# Patient Record
Sex: Female | Born: 1935
Health system: Southern US, Community
[De-identification: ages and names within clinical notes are randomized; demographics above are authoritative.]

## PROBLEM LIST (undated history)

## (undated) DIAGNOSIS — R296 Repeated falls: Secondary | ICD-10-CM

## (undated) DIAGNOSIS — E785 Hyperlipidemia, unspecified: Secondary | ICD-10-CM

## (undated) DIAGNOSIS — I10 Essential (primary) hypertension: Secondary | ICD-10-CM

## (undated) DIAGNOSIS — J45909 Unspecified asthma, uncomplicated: Secondary | ICD-10-CM

## (undated) DIAGNOSIS — I2510 Atherosclerotic heart disease of native coronary artery without angina pectoris: Secondary | ICD-10-CM

## (undated) HISTORY — DX: Essential (primary) hypertension: I10

## (undated) HISTORY — DX: Unspecified asthma, uncomplicated: J45.909

## (undated) HISTORY — DX: Hyperlipidemia, unspecified: E78.5

## (undated) HISTORY — PX: CATARACT EXTRACTION: SUR2

## (undated) HISTORY — DX: Atherosclerotic heart disease of native coronary artery without angina pectoris: I25.10

## (undated) HISTORY — PX: CORONARY ANGIOPLASTY: SHX604

---

## 2001-05-24 ENCOUNTER — Emergency Department (HOSPITAL_COMMUNITY): Admission: EM | Admit: 2001-05-24 | Discharge: 2001-05-24 | Payer: Self-pay | Admitting: Emergency Medicine

## 2001-05-29 ENCOUNTER — Emergency Department (HOSPITAL_COMMUNITY): Admission: EM | Admit: 2001-05-29 | Discharge: 2001-05-29 | Payer: Self-pay | Admitting: *Deleted

## 2001-10-20 ENCOUNTER — Ambulatory Visit (HOSPITAL_COMMUNITY): Admission: RE | Admit: 2001-10-20 | Discharge: 2001-10-20 | Payer: Self-pay | Admitting: Internal Medicine

## 2002-03-26 ENCOUNTER — Emergency Department (HOSPITAL_COMMUNITY): Admission: EM | Admit: 2002-03-26 | Discharge: 2002-03-26 | Payer: Self-pay | Admitting: Emergency Medicine

## 2002-03-29 ENCOUNTER — Encounter: Payer: Self-pay | Admitting: Internal Medicine

## 2002-03-29 ENCOUNTER — Inpatient Hospital Stay (HOSPITAL_COMMUNITY): Admission: AD | Admit: 2002-03-29 | Discharge: 2002-04-04 | Payer: Self-pay | Admitting: Internal Medicine

## 2002-03-31 ENCOUNTER — Encounter: Payer: Self-pay | Admitting: Internal Medicine

## 2002-04-25 ENCOUNTER — Encounter: Admission: RE | Admit: 2002-04-25 | Discharge: 2002-04-25 | Payer: Self-pay | Admitting: Internal Medicine

## 2002-04-25 ENCOUNTER — Encounter: Payer: Self-pay | Admitting: Internal Medicine

## 2013-05-16 ENCOUNTER — Encounter: Payer: Self-pay | Admitting: *Deleted

## 2013-05-17 ENCOUNTER — Encounter: Payer: Self-pay | Admitting: Cardiology

## 2013-05-17 ENCOUNTER — Ambulatory Visit: Payer: Self-pay | Admitting: Cardiology

## 2013-05-17 ENCOUNTER — Ambulatory Visit (INDEPENDENT_AMBULATORY_CARE_PROVIDER_SITE_OTHER): Payer: Medicare HMO | Admitting: Cardiology

## 2013-05-17 VITALS — BP 160/80 | HR 57 | Ht 66.5 in | Wt 161.1 lb

## 2013-05-17 DIAGNOSIS — I10 Essential (primary) hypertension: Secondary | ICD-10-CM

## 2013-05-17 DIAGNOSIS — R0989 Other specified symptoms and signs involving the circulatory and respiratory systems: Secondary | ICD-10-CM

## 2013-05-17 DIAGNOSIS — I25810 Atherosclerosis of coronary artery bypass graft(s) without angina pectoris: Secondary | ICD-10-CM

## 2013-05-17 DIAGNOSIS — R079 Chest pain, unspecified: Secondary | ICD-10-CM

## 2013-05-17 NOTE — Patient Instructions (Signed)
Your physician wants you to follow-up in: [place] will receive a reminder letter in the mail two months in advance. If you don't receive a letter, please call our office to schedule the follow-up appointment.    Your physician has requested that you have an exercise tolerance test. For further information please visit [email or web address]. Please also follow instruction sheet, as given.   Your physician has requested that you have a carotid duplex. This test is an ultrasound of the carotid arteries in your neck. It looks at blood flow through these arteries that supply the brain with blood. Allow one hour for this exam. There are no restrictions or special instructions.

## 2013-05-17 NOTE — Progress Notes (Signed)
HPI The patient presents as a new patient. She has a history of coronary disease and reports getting 2 stents in 2000 and another 2 stents in 2005. These were apparently at [facility]  However, I don't have any details. She says since 2005 she's not had any further problems. She says she gets along very well and exercises routinely at the [facility]. With this she has not had any of the symptoms that she had in the past.  The patient denies any new symptoms such as chest discomfort, neck or arm discomfort. There has been no new shortness of breath, PND or orthopnea. There have been no reported palpitations, presyncope or syncope.  Her symptoms years ago for somewhat atypical with headache and perhaps chest pain but she doesn't recall all the details. She has had difficult to control hypertension and she worries about this. She recently had her beta blocker dose increased. She was referred here to reestablish with the cardiologist.  No Known Allergies  Current Outpatient Prescriptions  Medication Sig Dispense Refill  . aspirin EC 81 MG tablet Take 81 mg by mouth daily.      Marland Kitchen CARTIA XT 120 MG 24 hr capsule Take 120 mg by mouth daily.       . COMBIVENT RESPIMAT 20-100 MCG/ACT AERS respimat Take 2 puffs by mouth every 6 (six) hours as needed.       . cyclobenzaprine (FLEXERIL) 10 MG tablet Take 10 mg by mouth 3 (three) times daily as needed.       . fluticasone (FLONASE) 50 MCG/ACT nasal spray 1 spray as needed.       . irbesartan (AVAPRO) 300 MG tablet Take 300 mg by mouth daily.       . isosorbide mononitrate (IMDUR) 30 MG 24 hr tablet Take 30 mg by mouth daily.       Marland Kitchen LORazepam (ATIVAN) 1 MG tablet       . lovastatin (MEVACOR) 40 MG tablet Take 40 mg by mouth every evening.       . metoprolol succinate (TOPROL-XL) 100 MG 24 hr tablet Take 100 mg by mouth daily.       . montelukast (SINGULAIR) 10 MG tablet Take 10 mg by mouth every evening.       . Multiple Vitamin (MULTIVITAMIN)  tablet Take 1 tablet by mouth daily.      Marland Kitchen triamterene-hydrochlorothiazide (DYAZIDE) 37.5-25 MG per capsule Take 1 capsule by mouth daily.      . vitamin C (ASCORBIC ACID) 500 MG tablet Take 500 mg by mouth daily.       No current facility-administered medications for this visit.    Past Medical History  Diagnosis Date  . Hypertension   . Asthma   . CAD (coronary artery disease)   . Hyperlipidemia     Past Surgical History  Procedure Laterality Date  . Coronary angioplasty  10 -15 years ago    stent in [place] ,[place]  . Cataract extraction      Family History  Problem Relation Age of Onset  . Cancer Father     colon    History   Social History  . Marital Status: Married    Spouse Name: N/A    Number of Children: 42  . Years of Education: N/A   Occupational History  . Not on file.   Social History Main Topics  . Smoking status: Former Smoker    Types: Cigarettes    Start date: 05/18/1983  .  Smokeless tobacco: Not on file  . Alcohol Use: No  . Drug Use: No  . Sexual Activity: Not on file   Other Topics Concern  . Not on file   Social History Narrative   Lives in retirement home.      ROS:  Positive for decreased hearing, joint pains.  Otherwise as stated in the HPI and negative for all other systems.  PHYSICAL EXAM BP 160/80  Pulse 57  Ht 5' 6.5" (1.689 m)  Wt 161 lb 1.6 oz (73.074 kg)  BMI 25.62 kg/m2 GENERAL:  Well appearing HEENT:  Pupils equal round and reactive, fundi not visualized, oral mucosa unremarkable NECK:  No jugular venous distention, waveform within normal limits, carotid upstroke brisk and symmetric, left carotid bruits, no thyromegaly LYMPHATICS:  No cervical, inguinal adenopathy LUNGS:  Clear to auscultation bilaterally BACK:  No CVA tenderness CHEST:  Unremarkable HEART:  PMI not displaced or sustained,S1 and S2 within normal limits, no S3, no S4, no clicks, no rubs, no murmurs ABD:  Flat, positive bowel sounds normal in  frequency in pitch, no bruits, no rebound, no guarding, no midline pulsatile mass, no hepatomegaly, no splenomegaly EXT:  2 plus pulses throughout, no edema, no cyanosis no clubbing SKIN:  No rashes no nodules NEURO:  Cranial nerves II through XII grossly intact, motor grossly intact throughout PSYCH:  Cognitively intact, oriented to person place and time  EKG:  Sinus bradycardia, rate 57, RSR prime V1 and V2, incomplete right bundle branch block, nonspecific ST-T wave changes.  05/17/2013  ASSESSMENT AND PLAN  CAD:  I will bring the patient back for a POET (Plain Old Exercise Test). This will allow me to screen for obstructive coronary disease, risk stratify and very importantly verify that she is doing the appropriate exercise.   We can also watch her BP with this.  HTN:  I will defer to Vena Austria, MD.  I tried to relieve her anxiety as this is contributing to difficult to control BP.  I might suggest if her blood pressures still remain elevated on followup we could consider treating her with spironolactone.  BRUIT:  She will have Dopplers

## 2013-05-18 ENCOUNTER — Telehealth (HOSPITAL_COMMUNITY): Payer: Self-pay | Admitting: *Deleted

## 2013-05-18 DIAGNOSIS — I10 Essential (primary) hypertension: Secondary | ICD-10-CM | POA: Insufficient documentation

## 2013-05-18 DIAGNOSIS — I2510 Atherosclerotic heart disease of native coronary artery without angina pectoris: Secondary | ICD-10-CM | POA: Insufficient documentation

## 2013-05-26 ENCOUNTER — Telehealth (HOSPITAL_COMMUNITY): Payer: Self-pay | Admitting: *Deleted

## 2013-06-01 ENCOUNTER — Encounter (HOSPITAL_COMMUNITY): Payer: Medicare HMO

## 2013-06-03 ENCOUNTER — Ambulatory Visit (HOSPITAL_COMMUNITY)
Admission: RE | Admit: 2013-06-03 | Discharge: 2013-06-03 | Disposition: A | Discharge status: Home / Self Care | Payer: Medicare HMO | Source: Ambulatory Visit | Attending: Cardiovascular Disease | Admitting: Cardiovascular Disease

## 2013-06-03 DIAGNOSIS — R0989 Other specified symptoms and signs involving the circulatory and respiratory systems: Secondary | ICD-10-CM | POA: Insufficient documentation

## 2013-06-03 NOTE — Progress Notes (Signed)
Carotid Duplex Completed. °Brianna L Mazza,RVT °

## 2013-06-08 ENCOUNTER — Encounter (HOSPITAL_COMMUNITY): Payer: Medicare HMO

## 2013-06-09 ENCOUNTER — Ambulatory Visit (HOSPITAL_COMMUNITY)
Admission: RE | Admit: 2013-06-09 | Discharge: 2013-06-09 | Disposition: A | Discharge status: Home / Self Care | Payer: Medicare HMO | Source: Ambulatory Visit | Attending: Cardiology | Admitting: Cardiology

## 2013-06-09 DIAGNOSIS — R079 Chest pain, unspecified: Secondary | ICD-10-CM | POA: Insufficient documentation

## 2013-06-14 ENCOUNTER — Telehealth: Payer: Self-pay | Admitting: *Deleted

## 2013-06-14 NOTE — Telephone Encounter (Signed)
Left message on private voicemail of GXT results and to call back with questions or concerns.

## 2013-06-28 ENCOUNTER — Telehealth: Payer: Self-pay | Admitting: Hematology and Oncology

## 2013-06-28 NOTE — Telephone Encounter (Signed)
CALLED PATIENT TO SCHEDULE NP APPT WAS TOLD TO CALL [facility] [name] TO SCHEDULE APPT  [phone].  CALLED [facility] @ [phone] WAS NOT ABLE TO LEAVE MESSAGE.

## 2013-06-28 NOTE — Telephone Encounter (Signed)
LEFT MESSAGE FOR PATIENT [facility] [name] TO RETURN CALL TO SCHEDULE NEW PATIENT APPT.

## 2013-07-01 ENCOUNTER — Telehealth: Payer: Self-pay | Admitting: Hematology and Oncology

## 2013-07-01 NOTE — Telephone Encounter (Signed)
C/D 07/01/13 for appt. 07/12/13

## 2013-07-12 ENCOUNTER — Ambulatory Visit: Payer: Medicare HMO

## 2013-07-12 ENCOUNTER — Ambulatory Visit: Payer: Medicare HMO | Admitting: Hematology and Oncology

## 2013-08-09 ENCOUNTER — Ambulatory Visit: Payer: Medicare HMO | Admitting: Hematology and Oncology

## 2013-08-09 ENCOUNTER — Ambulatory Visit: Payer: Medicare HMO

## 2016-05-08 ENCOUNTER — Inpatient Hospital Stay (HOSPITAL_COMMUNITY)
Admission: EM | Admit: 2016-05-08 | Discharge: 2016-05-11 | DRG: 193 | Disposition: A | Discharge status: Home Health | Payer: Medicare Other | Attending: Family Medicine | Admitting: Family Medicine

## 2016-05-08 ENCOUNTER — Emergency Department (HOSPITAL_COMMUNITY): Payer: Medicare Other

## 2016-05-08 ENCOUNTER — Encounter (HOSPITAL_COMMUNITY): Payer: Self-pay | Admitting: Emergency Medicine

## 2016-05-08 DIAGNOSIS — W19XXXA Unspecified fall, initial encounter: Secondary | ICD-10-CM | POA: Diagnosis present

## 2016-05-08 DIAGNOSIS — R599 Enlarged lymph nodes, unspecified: Secondary | ICD-10-CM | POA: Diagnosis not present

## 2016-05-08 DIAGNOSIS — E785 Hyperlipidemia, unspecified: Secondary | ICD-10-CM | POA: Diagnosis present

## 2016-05-08 DIAGNOSIS — E876 Hypokalemia: Secondary | ICD-10-CM | POA: Diagnosis present

## 2016-05-08 DIAGNOSIS — R630 Anorexia: Secondary | ICD-10-CM | POA: Diagnosis present

## 2016-05-08 DIAGNOSIS — Z800 Family history of malignant neoplasm of digestive organs: Secondary | ICD-10-CM

## 2016-05-08 DIAGNOSIS — D72820 Lymphocytosis (symptomatic): Secondary | ICD-10-CM | POA: Diagnosis present

## 2016-05-08 DIAGNOSIS — I5033 Acute on chronic diastolic (congestive) heart failure: Secondary | ICD-10-CM | POA: Diagnosis not present

## 2016-05-08 DIAGNOSIS — S40812A Abrasion of left upper arm, initial encounter: Secondary | ICD-10-CM | POA: Diagnosis present

## 2016-05-08 DIAGNOSIS — R5381 Other malaise: Secondary | ICD-10-CM | POA: Diagnosis present

## 2016-05-08 DIAGNOSIS — I2510 Atherosclerotic heart disease of native coronary artery without angina pectoris: Secondary | ICD-10-CM | POA: Diagnosis present

## 2016-05-08 DIAGNOSIS — I110 Hypertensive heart disease with heart failure: Secondary | ICD-10-CM | POA: Diagnosis present

## 2016-05-08 DIAGNOSIS — R9389 Abnormal findings on diagnostic imaging of other specified body structures: Secondary | ICD-10-CM

## 2016-05-08 DIAGNOSIS — E860 Dehydration: Secondary | ICD-10-CM

## 2016-05-08 DIAGNOSIS — J209 Acute bronchitis, unspecified: Secondary | ICD-10-CM | POA: Diagnosis present

## 2016-05-08 DIAGNOSIS — J440 Chronic obstructive pulmonary disease with acute lower respiratory infection: Secondary | ICD-10-CM | POA: Diagnosis present

## 2016-05-08 DIAGNOSIS — Z79899 Other long term (current) drug therapy: Secondary | ICD-10-CM

## 2016-05-08 DIAGNOSIS — R634 Abnormal weight loss: Secondary | ICD-10-CM | POA: Diagnosis not present

## 2016-05-08 DIAGNOSIS — Z87891 Personal history of nicotine dependence: Secondary | ICD-10-CM

## 2016-05-08 DIAGNOSIS — R590 Localized enlarged lymph nodes: Secondary | ICD-10-CM | POA: Diagnosis not present

## 2016-05-08 DIAGNOSIS — J101 Influenza due to other identified influenza virus with other respiratory manifestations: Principal | ICD-10-CM | POA: Diagnosis present

## 2016-05-08 DIAGNOSIS — Z9849 Cataract extraction status, unspecified eye: Secondary | ICD-10-CM | POA: Diagnosis not present

## 2016-05-08 DIAGNOSIS — S51012A Laceration without foreign body of left elbow, initial encounter: Secondary | ICD-10-CM

## 2016-05-08 DIAGNOSIS — Z9861 Coronary angioplasty status: Secondary | ICD-10-CM | POA: Diagnosis not present

## 2016-05-08 DIAGNOSIS — R531 Weakness: Secondary | ICD-10-CM | POA: Diagnosis not present

## 2016-05-08 DIAGNOSIS — D72829 Elevated white blood cell count, unspecified: Secondary | ICD-10-CM | POA: Diagnosis present

## 2016-05-08 DIAGNOSIS — R591 Generalized enlarged lymph nodes: Secondary | ICD-10-CM | POA: Diagnosis not present

## 2016-05-08 DIAGNOSIS — E861 Hypovolemia: Secondary | ICD-10-CM | POA: Diagnosis present

## 2016-05-08 DIAGNOSIS — J449 Chronic obstructive pulmonary disease, unspecified: Secondary | ICD-10-CM | POA: Diagnosis present

## 2016-05-08 DIAGNOSIS — E871 Hypo-osmolality and hyponatremia: Secondary | ICD-10-CM | POA: Diagnosis present

## 2016-05-08 DIAGNOSIS — Y92099 Unspecified place in other non-institutional residence as the place of occurrence of the external cause: Secondary | ICD-10-CM

## 2016-05-08 DIAGNOSIS — Z6828 Body mass index (BMI) 28.0-28.9, adult: Secondary | ICD-10-CM

## 2016-05-08 DIAGNOSIS — R55 Syncope and collapse: Secondary | ICD-10-CM | POA: Diagnosis present

## 2016-05-08 DIAGNOSIS — J431 Panlobular emphysema: Secondary | ICD-10-CM | POA: Diagnosis not present

## 2016-05-08 LAB — URINALYSIS, ROUTINE W REFLEX MICROSCOPIC
BACTERIA UA: NONE SEEN
BILIRUBIN URINE: NEGATIVE
Glucose, UA: NEGATIVE mg/dL
Ketones, ur: 5 mg/dL — AB
Leukocytes, UA: NEGATIVE
NITRITE: NEGATIVE
Protein, ur: NEGATIVE mg/dL
Specific Gravity, Urine: 1.008 (ref 1.005–1.030)
pH: 6 (ref 5.0–8.0)

## 2016-05-08 LAB — BASIC METABOLIC PANEL
ANION GAP: 12 (ref 5–15)
BUN: 15 mg/dL (ref 6–20)
CO2: 24 mmol/L (ref 22–32)
Calcium: 8.7 mg/dL — ABNORMAL LOW (ref 8.9–10.3)
Chloride: 91 mmol/L — ABNORMAL LOW (ref 101–111)
Creatinine, Ser: 0.99 mg/dL (ref 0.44–1.00)
GFR calc non Af Amer: 50 mL/min — ABNORMAL LOW (ref >60)
GFR, EST AFRICAN AMERICAN: 58 mL/min — AB (ref >60)
Glucose, Bld: 119 mg/dL — ABNORMAL HIGH (ref 65–99)
POTASSIUM: 3.3 mmol/L — AB (ref 3.5–5.1)
SODIUM: 127 mmol/L — AB (ref 135–145)

## 2016-05-08 LAB — CBC WITH DIFFERENTIAL/PLATELET
Band Neutrophils: 0 %
Basophils Absolute: 0 10*3/uL (ref 0.0–0.1)
Basophils Relative: 0 %
Blasts: 0 %
Eosinophils Absolute: 0 10*3/uL (ref 0.0–0.7)
Eosinophils Relative: 0 %
HCT: 34.9 % — ABNORMAL LOW (ref 36.0–46.0)
Hemoglobin: 12.1 g/dL (ref 12.0–15.0)
LYMPHS ABS: 26.4 10*3/uL — AB (ref 0.7–4.0)
LYMPHS PCT: 69 %
MCH: 29.8 pg (ref 26.0–34.0)
MCHC: 34.7 g/dL (ref 30.0–36.0)
MCV: 86 fL (ref 78.0–100.0)
MONOS PCT: 17 %
Metamyelocytes Relative: 0 %
Monocytes Absolute: 6.5 10*3/uL — ABNORMAL HIGH (ref 0.1–1.0)
Myelocytes: 0 %
NEUTROS ABS: 5.4 10*3/uL (ref 1.7–7.7)
Neutrophils Relative %: 14 %
OTHER: 0 %
Platelets: 210 10*3/uL (ref 150–400)
Promyelocytes Absolute: 0 %
RBC: 4.06 MIL/uL (ref 3.87–5.11)
RDW: 14.7 % (ref 11.5–15.5)
WBC: 38.3 10*3/uL — AB (ref 4.0–10.5)
nRBC: 0 /100 WBC

## 2016-05-08 LAB — I-STAT CG4 LACTIC ACID, ED: Lactic Acid, Venous: 1.34 mmol/L (ref 0.5–1.9)

## 2016-05-08 LAB — INFLUENZA PANEL BY PCR (TYPE A & B)
INFLAPCR: POSITIVE — AB
Influenza B By PCR: NEGATIVE

## 2016-05-08 LAB — CK: Total CK: 349 U/L — ABNORMAL HIGH (ref 38–234)

## 2016-05-08 MED ORDER — IOPAMIDOL (ISOVUE-370) INJECTION 76%
100.0000 mL | Freq: Once | INTRAVENOUS | Status: AC | PRN
  Administered 2016-05-08: 100 mL via INTRAVENOUS

## 2016-05-08 MED ORDER — SODIUM CHLORIDE 0.9 % IJ SOLN
INTRAMUSCULAR | Status: AC
  Filled 2016-05-08: qty 50

## 2016-05-08 MED ORDER — SODIUM CHLORIDE 0.9 % IV BOLUS (SEPSIS)
500.0000 mL | Freq: Once | INTRAVENOUS | Status: AC
  Administered 2016-05-08: 500 mL via INTRAVENOUS

## 2016-05-08 MED ORDER — IPRATROPIUM-ALBUTEROL 0.5-2.5 (3) MG/3ML IN SOLN
3.0000 mL | Freq: Once | RESPIRATORY_TRACT | Status: AC
  Administered 2016-05-08: 3 mL via RESPIRATORY_TRACT
  Filled 2016-05-08: qty 3

## 2016-05-08 MED ORDER — ALBUTEROL SULFATE HFA 108 (90 BASE) MCG/ACT IN AERS
2.0000 | INHALATION_SPRAY | Freq: Once | RESPIRATORY_TRACT | Status: AC
  Administered 2016-05-08: 2 via RESPIRATORY_TRACT
  Filled 2016-05-08: qty 6.7

## 2016-05-08 MED ORDER — BENZONATATE 100 MG PO CAPS
100.0000 mg | ORAL_CAPSULE | ORAL | Status: AC
  Administered 2016-05-08: 100 mg via ORAL
  Filled 2016-05-08: qty 1

## 2016-05-08 MED ORDER — OSELTAMIVIR PHOSPHATE 75 MG PO CAPS
75.0000 mg | ORAL_CAPSULE | ORAL | Status: AC
  Administered 2016-05-08: 75 mg via ORAL
  Filled 2016-05-08: qty 1

## 2016-05-08 MED ORDER — IOPAMIDOL (ISOVUE-370) INJECTION 76%
INTRAVENOUS | Status: AC
  Filled 2016-05-08: qty 100

## 2016-05-08 MED ORDER — GUAIFENESIN-DM 100-10 MG/5ML PO SYRP
5.0000 mL | ORAL_SOLUTION | ORAL | Status: DC | PRN
  Administered 2016-05-08: 5 mL via ORAL
  Filled 2016-05-08: qty 10

## 2016-05-08 NOTE — ED Provider Notes (Signed)
[facility] DEPT Provider Note   CSN: [id number] Arrival date & time: 05/08/16  1810     History   Chief Complaint Chief Complaint  Patient presents with  . Fall    unwitnessed    HPI Carla Conway is a 83 y.o. female.  HPI   Pt with hx HTN, HLD, CAD, asthma presents after unwitnessed fall that she does not remember.  States she found herself on the floor and was unable to get up, waited all day for her daughter to come at 4pm.  Pt has been sick with cough and sore throat that began overnight.  Reports pain only in her left elbow.  Has not been able to take her daily medications.    Past Medical History:  Diagnosis Date  . Asthma   . CAD (coronary artery disease)   . Hyperlipidemia   . Hypertension     Patient Active Problem List   Diagnosis Date Noted  . Syncope 05/08/2016  . HTN (hypertension) 05/18/2013  . CAD in native artery 05/18/2013    Past Surgical History:  Procedure Laterality Date  . CATARACT EXTRACTION    . CORONARY ANGIOPLASTY  10 -15 years ago   stent in [place] ,[place]    [place] History    No data available       Home Medications    Prior to Admission medications   Medication Sig Start Date End Date Taking? Authorizing Provider  COMBIVENT RESPIMAT 20-100 MCG/ACT AERS respimat Take 2 puffs by mouth every 6 (six) hours as needed.  04/16/13  Yes Historical Provider, MD  irbesartan (AVAPRO) 300 MG tablet Take 300 mg by mouth daily.  05/09/13  Yes Historical Provider, MD  isosorbide mononitrate (IMDUR) 30 MG 24 hr tablet Take 30 mg by mouth daily.  05/09/13  Yes Historical Provider, MD  lovastatin (MEVACOR) 40 MG tablet Take 40 mg by mouth every evening.  05/09/13  Yes Historical Provider, MD  metoprolol succinate (TOPROL-XL) 100 MG 24 hr tablet Take 100 mg by mouth daily.  05/02/13  Yes Historical Provider, MD  montelukast (SINGULAIR) 10 MG tablet Take 10 mg by mouth every evening.  05/09/13  Yes Historical Provider, MD  potassium chloride (K-DUR) 10 MEQ  tablet Take 10 mEq by mouth daily. 03/24/16  Yes Historical Provider, MD  QUEtiapine (SEROQUEL) 25 MG tablet Take 25 mg by mouth 2 (two) times daily.   Yes Historical Provider, MD  triamterene-hydrochlorothiazide (MAXZIDE-25) 37.5-25 MG tablet Take 1 tablet by mouth daily.    Yes Historical Provider, MD    Family History Family History  Problem Relation Age of Onset  . Cancer Father     colon    Social History Social History  Substance Use Topics  . Smoking status: Former Smoker    Types: Cigarettes    Start date: 05/18/1983  . Smokeless tobacco: Never Used  . Alcohol use No     Allergies   Patient has no known allergies.   Review of Systems Review of Systems  All other systems reviewed and are negative.    Physical Exam Updated Vital Signs BP 149/61   Pulse 80   Temp 98.8 F (37.1 C) (Oral)   Resp 16   SpO2 99%   Physical Exam  Constitutional: She is oriented to person, place, and time. She appears well-developed and well-nourished. No distress.  HENT:  Head: Normocephalic and atraumatic.  Neck: Neck supple.  Cardiovascular: Normal rate and regular rhythm.   Pulmonary/Chest: Effort normal. No  respiratory distress. She has wheezes. She has no rales.  Abdominal: Soft. She exhibits no distension. There is no tenderness. There is no rebound and no guarding.  Musculoskeletal:  Left elbow with large skin tear over lateral elbow.  Dorsal forearm with smaller skin tear.  Hemostatic.  Bony tenderness laterally.    Neurological: She is alert and oriented to person, place, and time.  Skin: She is not diaphoretic.  Nursing note and vitals reviewed.    ED Treatments / Results  Labs (all labs ordered are listed, but only abnormal results are displayed) Labs Reviewed  BASIC METABOLIC PANEL - Abnormal; Notable for the following:       Result Value   Sodium 127 (*)    Potassium 3.3 (*)    Chloride 91 (*)    Glucose, Bld 119 (*)    Calcium 8.7 (*)    GFR calc non Af  Amer 50 (*)    GFR calc Af Amer 58 (*)    All other components within normal limits  CBC WITH DIFFERENTIAL/PLATELET - Abnormal; Notable for the following:    WBC 38.3 (*)    HCT 34.9 (*)    Lymphs Abs 26.4 (*)    Monocytes Absolute 6.5 (*)    All other components within normal limits  CK - Abnormal; Notable for the following:    Total CK 349 (*)    All other components within normal limits  URINALYSIS, ROUTINE W REFLEX MICROSCOPIC - Abnormal; Notable for the following:    Hgb urine dipstick SMALL (*)    Ketones, ur 5 (*)    Squamous Epithelial / LPF 0-5 (*)    All other components within normal limits  INFLUENZA PANEL BY PCR (TYPE A & B) - Abnormal; Notable for the following:    Influenza A By PCR POSITIVE (*)    All other components within normal limits  PATHOLOGIST SMEAR REVIEW  I-STAT CG4 LACTIC ACID, ED  Randolm Idol, ED    EKG  EKG Interpretation None       Radiology Dg Chest 2 View  Result Date: 05/08/2016 CLINICAL DATA:  Patient found on floor. Nonproductive cough and sore throat, subacute onset. Generalized weakness, acute onset. Initial encounter. EXAM: CHEST  2 VIEW COMPARISON:  Chest radiograph performed 07/05/2013 FINDINGS: The lungs are well-aerated. Minimal bibasilar atelectasis is noted. There is no evidence of pleural effusion or pneumothorax. The heart is borderline normal in size. Scattered calcification is seen along the thoracic and proximal abdominal aorta. No acute osseous abnormalities are seen. Mild degenerative change is noted at the right glenohumeral joint. IMPRESSION: 1. Minimal bibasilar atelectasis noted. Lungs otherwise grossly clear. 2. Scattered aortic atherosclerosis. Electronically Signed   By: Garald Balding M.D.   On: 05/08/2016 19:34   Dg Pelvis 1-2 Views  Result Date: 05/08/2016 CLINICAL DATA:  Status post unwitnessed fall, with concern for pelvic injury. Initial encounter. EXAM: PELVIS - 1-2 VIEW COMPARISON:  None. FINDINGS: There is no  evidence of fracture or dislocation. Both femoral heads are seated normally within their respective acetabula. Mild degenerative change is noted at the lower lumbar spine. The sacroiliac joints are grossly unremarkable. The visualized bowel gas pattern is grossly unremarkable in appearance. IMPRESSION: No evidence of fracture or dislocation. Electronically Signed   By: Garald Balding M.D.   On: 05/08/2016 19:35   Dg Elbow Complete Left  Result Date: 05/08/2016 CLINICAL DATA:  Status post unwitnessed fall, with left elbow laceration and pain. Initial encounter. EXAM: LEFT ELBOW -  COMPLETE 3+ VIEW COMPARISON:  None. FINDINGS: There is no evidence of fracture or dislocation. The visualized joint spaces are preserved. No significant joint effusion is identified. The known soft tissue laceration is not well characterized on radiograph. IMPRESSION: No evidence of fracture or dislocation. Electronically Signed   By: Garald Balding M.D.   On: 05/08/2016 19:35   Ct Head Wo Contrast  Result Date: 05/08/2016 CLINICAL DATA:  Unwitnessed fall. EXAM: CT HEAD WITHOUT CONTRAST CT CERVICAL SPINE WITHOUT CONTRAST TECHNIQUE: Multidetector CT imaging of the head and cervical spine was performed following the standard protocol without intravenous contrast. Multiplanar CT image reconstructions of the cervical spine were also generated. COMPARISON:  None. FINDINGS: CT HEAD FINDINGS Brain: No evidence of parenchymal hemorrhage or extra-axial fluid collection. No mass lesion, mass effect, or midline shift. No CT evidence of acute infarction. Nonspecific prominent subcortical and periventricular white matter hypodensity, most in keeping with chronic small vessel ischemic change. Generalized cerebral volume loss. No ventriculomegaly. Vascular: No hyperdense vessel or unexpected calcification. Skull: No evidence of calvarial fracture. Sinuses/Orbits: Mucoperiosteal thickening throughout the bilateral ethmoidal air cells and visualized  bilateral maxillary sinuses with hyperostosis of the visualized right greater than left maxillary sinus walls. No fluid levels in the visualized paranasal sinuses. Other: Mild left anterior frontal scalp contusion. The mastoid air cells are unopacified. CT CERVICAL SPINE FINDINGS Alignment: Straightening of the cervical spine. Minimal 2 mm anterolisthesis at C2-3. Otherwise no subluxation. Dens is well positioned between the lateral masses of C1. Skull base and vertebrae: No acute fracture. No primary bone lesion or focal pathologic process. Soft tissues and spinal canal: No prevertebral fluid or swelling. No visible canal hematoma. Disc levels: Severe degenerative disc disease throughout the cervical spine. Moderate bilateral facet arthropathy, most prominent in the left upper cervical spine. Moderate degenerate foraminal stenosis on the right at C3-4. Mild degenerate foraminal stenosis on the left at C4-5. Moderate bilateral degenerative foraminal stenosis at C5-6. Moderate degenerate foraminal stenosis on the right at C6-7. Upper chest: Negative. Other: Visualized mastoid air cells appear clear. No discrete thyroid nodules. Symmetric bilateral cervical lymphadenopathy involving levels 2 through 5 bilaterally, measuring up to 1.1 cm on the right at level to (series 8/ image 34) and 1.1 cm on the left at level 3 (series 8/ image 64). IMPRESSION: 1. Mild left anterior frontal scalp contusion. 2. No evidence of acute intracranial abnormality. No evidence of calvarial fracture. 3. Chronic paranasal sinusitis. 4. No cervical spine fracture. 5. Severe degenerative changes in the cervical spine as described. Minimal 2 mm anterolisthesis at C2-3 is probably degenerative. 6. Symmetric bilateral neck lymphadenopathy, nonspecific, cannot exclude a lymphoproliferative disorder. Management options include a follow-up neck CT in 3 months versus tissue sampling, as clinically warranted. Electronically Signed   By: Ilona Sorrel  M.D.   On: 05/08/2016 19:40   Ct Angio Chest Pe W And/or Wo Contrast  Result Date: 05/08/2016 CLINICAL DATA:  Syncope and hypoxia.  Unwitnessed fall. EXAM: CT ANGIOGRAPHY CHEST WITH CONTRAST TECHNIQUE: Multidetector CT imaging of the chest was performed using the standard protocol during bolus administration of intravenous contrast. Multiplanar CT image reconstructions and MIPs were obtained to evaluate the vascular anatomy. CONTRAST:  100 mL Isovue 370 intravenous COMPARISON:  Radiographs 05/08/2016 FINDINGS: Cardiovascular: Satisfactory opacification of the pulmonary arteries to the segmental level. No evidence of pulmonary embolism. Normal heart size. No pericardial effusion. Mediastinum/Nodes: Numerous pathologically enlarged nodes in the mediastinum, hila, axillary regions, supraclavicular regions and upper abdomen. Many of the nodes  measure well over 2 cm in short axis. Esophagus is unremarkable.  Small hiatal hernia. Lungs/Pleura: Lungs are clear. No pleural effusion or pneumothorax. Upper Abdomen: Low-attenuation liver lesions, incompletely imaged but most likely benign cysts. By report these are larger than on 03/30/2002. Numerous enlarged nodes in the splenic hilum and peripancreatic region. Spleen is generous in size but incompletely imaged. No focal lesions are evident in the included portions of the spleen. Musculoskeletal: No significant skeletal lesion. Moderate chronic appearing compression of T4. Review of the MIP images confirms the above findings. IMPRESSION: 1. Negative for acute pulmonary embolism 2. Numerous pathologic nodes in the mediastinum, hila, axillae, supraclavicular regions and upper abdomen. The adenopathy is suspicious for malignancy such as lymphoma or CLL. If tissue diagnosis is desired, many of the supraclavicular and axillary nodes should be palpable and accessible to excisional biopsy. 3. Low-attenuation liver lesions, incompletely imaged and not characterized but more likely  benign. 4. Small hiatal hernia. 5. These results will be called to the ordering clinician or representative by the Radiologist Assistant, and communication documented in the PACS or zVision Dashboard. Electronically Signed   By: Andreas Newport M.D.   On: 05/08/2016 22:18   Ct Cervical Spine Wo Contrast  Result Date: 05/08/2016 CLINICAL DATA:  Unwitnessed fall. EXAM: CT HEAD WITHOUT CONTRAST CT CERVICAL SPINE WITHOUT CONTRAST TECHNIQUE: Multidetector CT imaging of the head and cervical spine was performed following the standard protocol without intravenous contrast. Multiplanar CT image reconstructions of the cervical spine were also generated. COMPARISON:  None. FINDINGS: CT HEAD FINDINGS Brain: No evidence of parenchymal hemorrhage or extra-axial fluid collection. No mass lesion, mass effect, or midline shift. No CT evidence of acute infarction. Nonspecific prominent subcortical and periventricular white matter hypodensity, most in keeping with chronic small vessel ischemic change. Generalized cerebral volume loss. No ventriculomegaly. Vascular: No hyperdense vessel or unexpected calcification. Skull: No evidence of calvarial fracture. Sinuses/Orbits: Mucoperiosteal thickening throughout the bilateral ethmoidal air cells and visualized bilateral maxillary sinuses with hyperostosis of the visualized right greater than left maxillary sinus walls. No fluid levels in the visualized paranasal sinuses. Other: Mild left anterior frontal scalp contusion. The mastoid air cells are unopacified. CT CERVICAL SPINE FINDINGS Alignment: Straightening of the cervical spine. Minimal 2 mm anterolisthesis at C2-3. Otherwise no subluxation. Dens is well positioned between the lateral masses of C1. Skull base and vertebrae: No acute fracture. No primary bone lesion or focal pathologic process. Soft tissues and spinal canal: No prevertebral fluid or swelling. No visible canal hematoma. Disc levels: Severe degenerative disc disease  throughout the cervical spine. Moderate bilateral facet arthropathy, most prominent in the left upper cervical spine. Moderate degenerate foraminal stenosis on the right at C3-4. Mild degenerate foraminal stenosis on the left at C4-5. Moderate bilateral degenerative foraminal stenosis at C5-6. Moderate degenerate foraminal stenosis on the right at C6-7. Upper chest: Negative. Other: Visualized mastoid air cells appear clear. No discrete thyroid nodules. Symmetric bilateral cervical lymphadenopathy involving levels 2 through 5 bilaterally, measuring up to 1.1 cm on the right at level to (series 8/ image 34) and 1.1 cm on the left at level 3 (series 8/ image 64). IMPRESSION: 1. Mild left anterior frontal scalp contusion. 2. No evidence of acute intracranial abnormality. No evidence of calvarial fracture. 3. Chronic paranasal sinusitis. 4. No cervical spine fracture. 5. Severe degenerative changes in the cervical spine as described. Minimal 2 mm anterolisthesis at C2-3 is probably degenerative. 6. Symmetric bilateral neck lymphadenopathy, nonspecific, cannot exclude a lymphoproliferative disorder. Management  options include a follow-up neck CT in 3 months versus tissue sampling, as clinically warranted. Electronically Signed   By: Ilona Sorrel M.D.   On: 05/08/2016 19:40    Procedures Procedures (including critical care time)  Medications Ordered in ED Medications  guaiFENesin-dextromethorphan (ROBITUSSIN DM) 100-10 MG/5ML syrup 5 mL (5 mLs Oral Given 05/08/16 2117)  sodium chloride 0.9 % injection (not administered)  iopamidol (ISOVUE-370) 76 % injection (not administered)  ipratropium-albuterol (DUONEB) 0.5-2.5 (3) MG/3ML nebulizer solution 3 mL (3 mLs Nebulization Given 05/08/16 2001)  sodium chloride 0.9 % bolus 500 mL (0 mLs Intravenous Stopped 05/08/16 2118)  benzonatate (TESSALON) capsule 100 mg (100 mg Oral Given 05/08/16 2118)  sodium chloride 0.9 % bolus 500 mL (0 mLs Intravenous Stopped 05/08/16 2237)    albuterol (PROVENTIL HFA;VENTOLIN HFA) 108 (90 Base) MCG/ACT inhaler 2 puff (2 puffs Inhalation Given 05/08/16 2243)  iopamidol (ISOVUE-370) 76 % injection 100 mL (100 mLs Intravenous Contrast Given 05/08/16 2157)  oseltamivir (TAMIFLU) capsule 75 mg (75 mg Oral Given 05/08/16 2237)     Initial Impression / Assessment and Plan / ED Course  I have reviewed the triage vital signs and the nursing notes.  Pertinent labs & imaging results that were available during my care of the patient were reviewed by me and considered in my medical decision making (see chart for details).  Clinical Course as of May 08 2316  Thu May 08, 2016  2128 Pt's CBC is elevated - but the lactate is normal, no fevers, and no signs of end organ damage on the labs. CT scan has non specific lymph node comments-  ? Lymphoma. WC is profoundly elevated, and could be paraneoplastic. We will not start any antibiotics unless we have an actual source. UA is pending. Flu is pending. WBC: (!) 38.3 [AN]  2129 WBC: (!) 38.3 [AN]    Clinical Course User Index [AN] Varney Biles, MD    Pt with hx asthma, CAD, HTN, HLD p/w episode of likely syncope and fall this morning followed by being stuck on the ground for approximately 6 hours due to weakness.  CK elevated at 349, IVF started.  Has had sore throat and cough.  Influenza is positive.  Nebs, cough medication, tamiflu ordered. CT angio chest demonstrates lymphadenopathy suspicious for malignancy.  Pt and family advised of results.  Discussed with Dr Jonnie Finner, [facility], who accepts patient for admission.   Final Clinical Impressions(s) / ED Diagnoses   Final diagnoses:  Syncope, unspecified syncope type  Influenza A  Abnormal chest CT  Fall, initial encounter  Skin tear of left elbow without complication, initial encounter  Dehydration    New Prescriptions New Prescriptions   No medications on file     [place], PA-C 05/08/16 [place], MD 05/10/16  [phone]

## 2016-05-08 NOTE — ED Notes (Signed)
Pt. In CT.

## 2016-05-08 NOTE — ED Notes (Signed)
EKG given to EDP,Pickering,MD., for review.

## 2016-05-08 NOTE — ED Triage Notes (Addendum)
Per EMS, pt from the [facility] for unwitnessed fall. Pt appears to have been on the floor since earlier today. Pt daughter has been unable to contact pt throughout day. Daughter found pt laying on floor next to bed at ~4PM today. Pt has been treated for nonproductive cough and sore throat for the past ~3 weeks. Pt last remembers getting up to go to the bathroom this morning but is not sure how she got on the floor.   Pt has skin tear to anterior arm that was bandaged by EMS. Pt is alert, oriented to person and place but not time.  CBG 162 HR 96 BP 172 palpated RR 24 O2 96 Room air

## 2016-05-08 NOTE — ED Notes (Signed)
Pt. Attempted to give urine specimen but unsuccessful.

## 2016-05-09 ENCOUNTER — Inpatient Hospital Stay (HOSPITAL_COMMUNITY): Payer: Medicare Other

## 2016-05-09 DIAGNOSIS — R531 Weakness: Secondary | ICD-10-CM

## 2016-05-09 DIAGNOSIS — R5381 Other malaise: Secondary | ICD-10-CM | POA: Diagnosis present

## 2016-05-09 DIAGNOSIS — R591 Generalized enlarged lymph nodes: Secondary | ICD-10-CM

## 2016-05-09 DIAGNOSIS — J449 Chronic obstructive pulmonary disease, unspecified: Secondary | ICD-10-CM | POA: Diagnosis present

## 2016-05-09 DIAGNOSIS — R634 Abnormal weight loss: Secondary | ICD-10-CM

## 2016-05-09 DIAGNOSIS — R590 Localized enlarged lymph nodes: Secondary | ICD-10-CM | POA: Diagnosis present

## 2016-05-09 DIAGNOSIS — R55 Syncope and collapse: Secondary | ICD-10-CM

## 2016-05-09 DIAGNOSIS — D72820 Lymphocytosis (symptomatic): Secondary | ICD-10-CM

## 2016-05-09 DIAGNOSIS — J101 Influenza due to other identified influenza virus with other respiratory manifestations: Secondary | ICD-10-CM | POA: Diagnosis present

## 2016-05-09 LAB — BASIC METABOLIC PANEL
Anion gap: 10 (ref 5–15)
BUN: 13 mg/dL (ref 6–20)
CHLORIDE: 95 mmol/L — AB (ref 101–111)
CO2: 25 mmol/L (ref 22–32)
Calcium: 8.4 mg/dL — ABNORMAL LOW (ref 8.9–10.3)
Creatinine, Ser: 0.99 mg/dL (ref 0.44–1.00)
GFR calc Af Amer: 58 mL/min — ABNORMAL LOW (ref >60)
GFR calc non Af Amer: 50 mL/min — ABNORMAL LOW (ref >60)
Glucose, Bld: 97 mg/dL (ref 65–99)
POTASSIUM: 3 mmol/L — AB (ref 3.5–5.1)
SODIUM: 130 mmol/L — AB (ref 135–145)

## 2016-05-09 LAB — ECHOCARDIOGRAM COMPLETE
HEIGHTINCHES: 62 in
Weight: 2476.21 oz

## 2016-05-09 LAB — CBC
HCT: 29.8 % — ABNORMAL LOW (ref 36.0–46.0)
HEMOGLOBIN: 10.4 g/dL — AB (ref 12.0–15.0)
MCH: 30.9 pg (ref 26.0–34.0)
MCHC: 34.9 g/dL (ref 30.0–36.0)
MCV: 88.4 fL (ref 78.0–100.0)
Platelets: 181 10*3/uL (ref 150–400)
RBC: 3.37 MIL/uL — AB (ref 3.87–5.11)
RDW: 15.2 % (ref 11.5–15.5)
WBC: 35.9 10*3/uL — ABNORMAL HIGH (ref 4.0–10.5)

## 2016-05-09 LAB — PATHOLOGIST SMEAR REVIEW

## 2016-05-09 LAB — MRSA PCR SCREENING: MRSA BY PCR: NEGATIVE

## 2016-05-09 MED ORDER — POTASSIUM CHLORIDE CRYS ER 10 MEQ PO TBCR
10.0000 meq | EXTENDED_RELEASE_TABLET | Freq: Every day | ORAL | Status: DC
  Administered 2016-05-09 – 2016-05-11 (×3): 10 meq via ORAL
  Filled 2016-05-09 (×3): qty 1

## 2016-05-09 MED ORDER — POTASSIUM CHLORIDE 2 MEQ/ML IV SOLN
30.0000 meq | Freq: Once | INTRAVENOUS | Status: AC
  Administered 2016-05-09: 30 meq via INTRAVENOUS
  Filled 2016-05-09: qty 15

## 2016-05-09 MED ORDER — METOPROLOL SUCCINATE ER 100 MG PO TB24
100.0000 mg | ORAL_TABLET | Freq: Every day | ORAL | Status: DC
  Administered 2016-05-09 – 2016-05-11 (×3): 100 mg via ORAL
  Filled 2016-05-09 (×3): qty 1

## 2016-05-09 MED ORDER — OSELTAMIVIR PHOSPHATE 30 MG PO CAPS
30.0000 mg | ORAL_CAPSULE | Freq: Two times a day (BID) | ORAL | Status: DC
  Administered 2016-05-09 – 2016-05-11 (×5): 30 mg via ORAL
  Filled 2016-05-09 (×5): qty 1

## 2016-05-09 MED ORDER — POLYETHYLENE GLYCOL 3350 17 G PO PACK: 17.0000 g | PACK | Freq: Every day | ORAL | Status: DC | PRN

## 2016-05-09 MED ORDER — ACETAMINOPHEN 650 MG RE SUPP: 650.0000 mg | Freq: Four times a day (QID) | RECTAL | Status: DC | PRN

## 2016-05-09 MED ORDER — ONDANSETRON HCL 4 MG PO TABS: 4.0000 mg | ORAL_TABLET | Freq: Four times a day (QID) | ORAL | Status: DC | PRN

## 2016-05-09 MED ORDER — ISOSORBIDE MONONITRATE ER 30 MG PO TB24
30.0000 mg | ORAL_TABLET | Freq: Every day | ORAL | Status: DC
  Administered 2016-05-09 – 2016-05-11 (×3): 30 mg via ORAL
  Filled 2016-05-09 (×3): qty 1

## 2016-05-09 MED ORDER — ZOLPIDEM TARTRATE 5 MG PO TABS
5.0000 mg | ORAL_TABLET | Freq: Every evening | ORAL | Status: DC | PRN
  Filled 2016-05-09: qty 1

## 2016-05-09 MED ORDER — GUAIFENESIN-DM 100-10 MG/5ML PO SYRP
5.0000 mL | ORAL_SOLUTION | ORAL | Status: DC | PRN
  Administered 2016-05-09 – 2016-05-11 (×2): 5 mL via ORAL
  Filled 2016-05-09 (×4): qty 10

## 2016-05-09 MED ORDER — QUETIAPINE FUMARATE 25 MG PO TABS
25.0000 mg | ORAL_TABLET | Freq: Two times a day (BID) | ORAL | Status: DC
  Administered 2016-05-09 – 2016-05-11 (×6): 25 mg via ORAL
  Filled 2016-05-09 (×6): qty 1

## 2016-05-09 MED ORDER — OSELTAMIVIR PHOSPHATE 75 MG PO CAPS: 75.0000 mg | ORAL_CAPSULE | Freq: Every day | ORAL | Status: DC

## 2016-05-09 MED ORDER — MONTELUKAST SODIUM 10 MG PO TABS
10.0000 mg | ORAL_TABLET | Freq: Every evening | ORAL | Status: DC
  Administered 2016-05-09 – 2016-05-10 (×2): 10 mg via ORAL
  Filled 2016-05-09 (×2): qty 1

## 2016-05-09 MED ORDER — SODIUM CHLORIDE 0.9 % IV SOLN
INTRAVENOUS | Status: DC
  Administered 2016-05-09 (×2): via INTRAVENOUS

## 2016-05-09 MED ORDER — ONDANSETRON HCL 4 MG/2ML IJ SOLN: 4.0000 mg | Freq: Four times a day (QID) | INTRAMUSCULAR | Status: DC | PRN

## 2016-05-09 MED ORDER — ALBUTEROL SULFATE (2.5 MG/3ML) 0.083% IN NEBU: 5.0000 mg | INHALATION_SOLUTION | RESPIRATORY_TRACT | Status: DC | PRN

## 2016-05-09 MED ORDER — ACETAMINOPHEN 325 MG PO TABS
650.0000 mg | ORAL_TABLET | Freq: Four times a day (QID) | ORAL | Status: DC | PRN
Start: 2016-05-09 — End: 2016-05-11

## 2016-05-09 MED ORDER — ENOXAPARIN SODIUM 40 MG/0.4ML ~~LOC~~ SOLN
40.0000 mg | SUBCUTANEOUS | Status: DC
Start: 2016-05-09 — End: 2016-05-11
  Administered 2016-05-09 – 2016-05-11 (×3): 40 mg via SUBCUTANEOUS
  Filled 2016-05-09 (×3): qty 0.4

## 2016-05-09 MED ORDER — ALBUTEROL SULFATE (2.5 MG/3ML) 0.083% IN NEBU
2.5000 mg | INHALATION_SOLUTION | Freq: Four times a day (QID) | RESPIRATORY_TRACT | Status: DC | PRN
  Administered 2016-05-10: 2.5 mg via RESPIRATORY_TRACT
  Filled 2016-05-09: qty 3

## 2016-05-09 MED ORDER — IPRATROPIUM-ALBUTEROL 20-100 MCG/ACT IN AERS: 2.0000 | INHALATION_SPRAY | Freq: Four times a day (QID) | RESPIRATORY_TRACT | Status: DC

## 2016-05-09 MED ORDER — IPRATROPIUM-ALBUTEROL 0.5-2.5 (3) MG/3ML IN SOLN: 3.0000 mL | Freq: Four times a day (QID) | RESPIRATORY_TRACT | Status: DC | PRN

## 2016-05-09 MED ORDER — PRAVASTATIN SODIUM 20 MG PO TABS
10.0000 mg | ORAL_TABLET | Freq: Every day | ORAL | Status: DC
Start: 2016-05-09 — End: 2016-05-11
  Administered 2016-05-09 – 2016-05-10 (×2): 10 mg via ORAL
  Filled 2016-05-09 (×2): qty 1

## 2016-05-09 MED ORDER — IPRATROPIUM-ALBUTEROL 0.5-2.5 (3) MG/3ML IN SOLN: 3.0000 mL | Freq: Four times a day (QID) | RESPIRATORY_TRACT | Status: DC

## 2016-05-09 MED ORDER — IRBESARTAN 150 MG PO TABS
300.0000 mg | ORAL_TABLET | Freq: Every day | ORAL | Status: DC
Start: 2016-05-09 — End: 2016-05-11
  Administered 2016-05-09 – 2016-05-11 (×3): 300 mg via ORAL
  Filled 2016-05-09 (×3): qty 2

## 2016-05-09 MED ORDER — IPRATROPIUM-ALBUTEROL 0.5-2.5 (3) MG/3ML IN SOLN
3.0000 mL | Freq: Three times a day (TID) | RESPIRATORY_TRACT | Status: DC
  Administered 2016-05-09 – 2016-05-10 (×4): 3 mL via RESPIRATORY_TRACT
  Filled 2016-05-09 (×4): qty 3

## 2016-05-09 NOTE — Progress Notes (Addendum)
Subjective: Patient admitted this morning, see detailed H&P by Dr Jonnie Finner 83 y.o. female with history of  HTN, CAD, HL who presented to ED today after being found on the floor at her apartment ([facility]) by her family.  Brought to ED where pt has cough and confusion, flu A swab is + and WBC 38K.  Pt had CT of neck/ head / chest and these studies are showing lots of lymphadenopathy in the chest, axillae and neck.  Possible CLL vs lymphoma.  CXR was negative. No fever. Asked to see for admission for fall/ flu, and possible cancer.   This morning she is breathing better.  Vitals:   05/09/16 0524 05/09/16 0825  BP: (!) 159/64   Pulse: 70 82  Resp: (!) 24 20  Temp: 98.4 F (36.9 C)       A/P  Influenza A Diffuse lymphadenopathy on CT chest Hypertension COPD Hypokalemia  Continue Tamiflu,  Duonebs tid,  Will consult Oncology for diffuse lymphadenopathy on CT chest Continue metoprolol, Avapro Replace potassium 10 meq IV x 3. Follow bmp in am.     [facility] Pager- [phone]

## 2016-05-09 NOTE — Progress Notes (Signed)
[facility] CONSULT NOTE  Patient Care Team: Maury Dus, MD as PCP - General (Family Medicine)  CHIEF COMPLAINTS/PURPOSE OF CONSULTATION:  Chronic leukocytosis, suspect CLL  HISTORY OF PRESENTING ILLNESS:  Carla Conway 83 y.o. female is seen because of elevated WBC.  She was found to have abnormal CBC from at least since 2015. The patient feels bad and was not able to talk much without significant coughing. She was actually referred to see me in May 2015. However, we were not able to set up outpatient appointment to see her. According to the records from her primary care doctor that were faxed to the [facility], she had leukocytosis at least since January 2015. On 05/02/2013, total white blood cell count was 28.3, normal hemoglobin 13.4 and platelet count 239,000. She had predominantly Lymphocytosis. On 06/20/2013, a CBC show white count of 26.7, hemoglobin 12.9 and platelet count of 299,000 On admission, on 05/08/2016, white blood cell count was 38.4, hemoglobin 12.1 and platelet count of 210. Differential show predominantly lymphocytosis. She is currently being admitted for management of influenza. She has weakness and recent falls She reported significant symptoms of sinus congestion, cough, and difficulties breathing.  She denies urinary frequency/urgency or dysuria, diarrhea, joint swelling/pain or abnormal skin rash.  She denies chest pain or lightheadedness. She has significant anorexia with recent unspecified amount of weight loss She was not able to provide further history because she felt tired. She requested that I call her daughter which I did but I was unable to get hold of her.  MEDICAL HISTORY:  Past Medical History:  Diagnosis Date  . Asthma   . CAD (coronary artery disease)   . Hyperlipidemia   . Hypertension     SURGICAL HISTORY: Past Surgical History:  Procedure Laterality Date  . CATARACT EXTRACTION    . CORONARY ANGIOPLASTY  10 -15 years  ago   stent in [place] ,[place] HISTORY: Social History   Social History  . Marital status: Married    Spouse name: N/A  . Number of children: 5  . Years of education: N/A   Occupational History  . Not on file.   Social History Main Topics  . Smoking status: Former Smoker    Types: Cigarettes    Start date: 05/18/1983  . Smokeless tobacco: Never Used  . Alcohol use No  . Drug use: No  . Sexual activity: Not on file   Other Topics Concern  . Not on file   Social History Narrative   Lives in retirement home.      FAMILY HISTORY: Family History  Problem Relation Age of Onset  . Cancer Father     colon    ALLERGIES:  has No Known Allergies.  MEDICATIONS:  Current Facility-Administered Medications  Medication Dose Route Frequency Provider Last Rate Last Dose  . 0.9 %  sodium chloride infusion   Intravenous Continuous Roney Jaffe, MD 65 mL/hr at 05/09/16 1600    . acetaminophen (TYLENOL) tablet 650 mg  650 mg Oral Q6H PRN Roney Jaffe, MD       Or  . acetaminophen (TYLENOL) suppository 650 mg  650 mg Rectal Q6H PRN Roney Jaffe, MD      . albuterol (PROVENTIL) (2.5 MG/3ML) 0.083% nebulizer solution 2.5 mg  2.5 mg Nebulization Q6H PRN Roney Jaffe, MD      . enoxaparin (LOVENOX) injection 40 mg  40 mg Subcutaneous Q24H Roney Jaffe, MD   40 mg at 05/09/16 0949  .  guaiFENesin-dextromethorphan (ROBITUSSIN DM) 100-10 MG/5ML syrup 5 mL  5 mL Oral Q4H PRN Oswald Hillock, MD   5 mL at 05/09/16 0950  . ipratropium-albuterol (DUONEB) 0.5-2.5 (3) MG/3ML nebulizer solution 3 mL  3 mL Nebulization TID Roney Jaffe, MD   3 mL at 05/09/16 1358  . irbesartan (AVAPRO) tablet 300 mg  300 mg Oral Daily Roney Jaffe, MD   300 mg at 05/09/16 0950  . isosorbide mononitrate (IMDUR) 24 hr tablet 30 mg  30 mg Oral Daily Roney Jaffe, MD   30 mg at 05/09/16 0949  . metoprolol succinate (TOPROL-XL) 24 hr tablet 100 mg  100 mg Oral Daily Roney Jaffe, MD   100 mg at  05/09/16 0949  . montelukast (SINGULAIR) tablet 10 mg  10 mg Oral QPM Roney Jaffe, MD      . ondansetron [facility]) tablet 4 mg  4 mg Oral Q6H PRN Roney Jaffe, MD       Or  . ondansetron [facility]) injection 4 mg  4 mg Intravenous Q6H PRN Roney Jaffe, MD      . oseltamivir (TAMIFLU) capsule 30 mg  30 mg Oral BID Oswald Hillock, MD   30 mg at 05/09/16 1508  . polyethylene glycol (MIRALAX / GLYCOLAX) packet 17 g  17 g Oral Daily PRN Roney Jaffe, MD      . potassium chloride (K-DUR,KLOR-CON) CR tablet 10 mEq  10 mEq Oral Daily Roney Jaffe, MD   10 mEq at 05/09/16 0949  . pravastatin (PRAVACHOL) tablet 10 mg  10 mg Oral q1800 Roney Jaffe, MD      . QUEtiapine (SEROQUEL) tablet 25 mg  25 mg Oral BID Roney Jaffe, MD   25 mg at 05/09/16 0950  . zolpidem (AMBIEN) tablet 5 mg  5 mg Oral QHS PRN Roney Jaffe, MD        REVIEW OF SYSTEMS:   Constitutional: Denies fevers, chills or abnormal night sweats Eyes: Denies blurriness of vision, double vision or watery eyes Ears, nose, mouth, throat, and face: Denies mucositis or sore throat Cardiovascular: Denies palpitation, chest discomfort or lower extremity swelling Gastrointestinal:  Denies nausea, heartburn or change in bowel habits Skin: Denies abnormal skin rashes Lymphatics: Denies new lymphadenopathy or easy bruising Neurological:Denies numbness, tingling  Behavioral/Psych: Mood is stable, no new changes  All other systems were reviewed with the patient and are negative.  PHYSICAL EXAMINATION: ECOG PERFORMANCE STATUS: 3 - Symptomatic, >50% confined to bed  Vitals:   05/09/16 0524 05/09/16 0825  BP: (!) 159/64   Pulse: 70 82  Resp: (!) 24 20  Temp: 98.4 F (36.9 C)    Filed Weights   05/09/16 0110  Weight: 154 lb 12.2 oz (70.2 kg)    GENERAL:alert, no distress and comfortable. She is ill-appearing SKIN: skin color is pale, texture, turgor are normal, no rashes or significant lesions EYES: normal, conjunctiva are pink  and non-injected, sclera clear OROPHARYNX:no exudate, no erythema and lips, buccal mucosa, and tongue normal  NECK: supple, thyroid normal size, non-tender, without nodularity LYMPH: She has diffuse palpable lymphadenopathy on both sides of her neck LUNGS: clear to auscultation and percussion with normal breathing effort HEART: regular rate & rhythm and no murmurs and no lower extremity edema ABDOMEN:abdomen soft, non-tender and normal bowel sounds Musculoskeletal:no cyanosis of digits and no clubbing  PSYCH: alert & oriented x 3 with fluent speech NEURO: no focal motor/sensory deficits  LABORATORY DATA:  I have reviewed the data as listed Recent Results (from the  past 2160 hour(s))  Basic metabolic panel     Status: Abnormal   Collection Time: 05/08/16  8:04 PM  Result Value Ref Range   Sodium 127 (L) 135 - 145 mmol/L   Potassium 3.3 (L) 3.5 - 5.1 mmol/L   Chloride 91 (L) 101 - 111 mmol/L   CO2 24 22 - 32 mmol/L   Glucose, Bld 119 (H) 65 - 99 mg/dL   BUN 15 6 - 20 mg/dL   Creatinine, Ser 0.99 0.44 - 1.00 mg/dL   Calcium 8.7 (L) 8.9 - 10.3 mg/dL   GFR calc non Af Amer 50 (L) >60 mL/min   GFR calc Af Amer 58 (L) >60 mL/min    Comment: (NOTE) The eGFR has been calculated using the CKD EPI equation. This calculation has not been validated in all clinical situations. eGFR's persistently <60 mL/min signify possible Chronic Kidney Disease.    Anion gap 12 5 - 15  CBC with Differential     Status: Abnormal   Collection Time: 05/08/16  8:04 PM  Result Value Ref Range   WBC 38.3 (H) 4.0 - 10.5 K/uL   RBC 4.06 3.87 - 5.11 MIL/uL   Hemoglobin 12.1 12.0 - 15.0 g/dL   HCT 34.9 (L) 36.0 - 46.0 %   MCV 86.0 78.0 - 100.0 fL   MCH 29.8 26.0 - 34.0 pg   MCHC 34.7 30.0 - 36.0 g/dL   RDW 14.7 11.5 - 15.5 %   Platelets 210 150 - 400 K/uL   Neutrophils Relative % 14 %   Lymphocytes Relative 69 %   Monocytes Relative 17 %   Eosinophils Relative 0 %   Basophils Relative 0 %   Band  Neutrophils 0 %   Metamyelocytes Relative 0 %   Myelocytes 0 %   Promyelocytes Absolute 0 %   Blasts 0 %   nRBC 0 0 /100 WBC   Other 0 %   Neutro Abs 5.4 1.7 - 7.7 K/uL   Lymphs Abs 26.4 (H) 0.7 - 4.0 K/uL   Monocytes Absolute 6.5 (H) 0.1 - 1.0 K/uL   Eosinophils Absolute 0.0 0.0 - 0.7 K/uL   Basophils Absolute 0.0 0.0 - 0.1 K/uL   WBC Morphology ABSOLUTE LYMPHOCYTOSIS   CK     Status: Abnormal   Collection Time: 05/08/16  8:04 PM  Result Value Ref Range   Total CK 349 (H) 38 - 234 U/L  Pathologist smear review     Status: None   Collection Time: 05/08/16  8:04 PM  Result Value Ref Range   Path Review Reviewed By Violet Baldy, M.D.     Comment: 2.2.18 ATYPICAL        Absolute lymphocytosis. Suggest immunophenotyping if a new, persistent finding.          Influenza panel by PCR (type A & B)     Status: Abnormal   Collection Time: 05/08/16  8:16 PM  Result Value Ref Range   Influenza A By PCR POSITIVE (A) NEGATIVE   Influenza B By PCR NEGATIVE NEGATIVE    Comment: (NOTE) The Xpert Xpress Flu assay is intended as an aid in the diagnosis of  influenza and should not be used as a sole basis for treatment.  This  assay is FDA approved for nasopharyngeal swab specimens only. Nasal  washings and aspirates are unacceptable for Xpert Xpress Flu testing.   I-Stat CG4 Lactic Acid, ED     Status: None   Collection Time: 05/08/16  8:24  PM  Result Value Ref Range   Lactic Acid, Venous 1.34 0.5 - 1.9 mmol/L  Urinalysis, Routine w reflex microscopic     Status: Abnormal   Collection Time: 05/08/16  9:31 PM  Result Value Ref Range   Color, Urine YELLOW YELLOW   APPearance CLEAR CLEAR   Specific Gravity, Urine 1.008 1.005 - 1.030   pH 6.0 5.0 - 8.0   Glucose, UA NEGATIVE NEGATIVE mg/dL   Hgb urine dipstick SMALL (A) NEGATIVE   Bilirubin Urine NEGATIVE NEGATIVE   Ketones, ur 5 (A) NEGATIVE mg/dL   Protein, ur NEGATIVE NEGATIVE mg/dL   Nitrite NEGATIVE NEGATIVE   Leukocytes, UA  NEGATIVE NEGATIVE   RBC / HPF 0-5 0 - 5 RBC/hpf   WBC, UA 0-5 0 - 5 WBC/hpf   Bacteria, UA NONE SEEN NONE SEEN   Squamous Epithelial / LPF 0-5 (A) NONE SEEN   Mucous PRESENT   MRSA PCR Screening     Status: None   Collection Time: 05/09/16  1:24 AM  Result Value Ref Range   MRSA by PCR NEGATIVE NEGATIVE    Comment:        The GeneXpert MRSA Assay (FDA approved for NASAL specimens only), is one component of a comprehensive MRSA colonization surveillance program. It is not intended to diagnose MRSA infection nor to guide or monitor treatment for MRSA infections.   CBC     Status: Abnormal   Collection Time: 05/09/16  5:50 AM  Result Value Ref Range   WBC 35.9 (H) 4.0 - 10.5 K/uL   RBC 3.37 (L) 3.87 - 5.11 MIL/uL   Hemoglobin 10.4 (L) 12.0 - 15.0 g/dL   HCT 29.8 (L) 36.0 - 46.0 %   MCV 88.4 78.0 - 100.0 fL   MCH 30.9 26.0 - 34.0 pg   MCHC 34.9 30.0 - 36.0 g/dL   RDW 15.2 11.5 - 15.5 %   Platelets 181 150 - 400 K/uL  Basic metabolic panel     Status: Abnormal   Collection Time: 05/09/16  5:50 AM  Result Value Ref Range   Sodium 130 (L) 135 - 145 mmol/L   Potassium 3.0 (L) 3.5 - 5.1 mmol/L   Chloride 95 (L) 101 - 111 mmol/L   CO2 25 22 - 32 mmol/L   Glucose, Bld 97 65 - 99 mg/dL   BUN 13 6 - 20 mg/dL   Creatinine, Ser 0.99 0.44 - 1.00 mg/dL   Calcium 8.4 (L) 8.9 - 10.3 mg/dL   GFR calc non Af Amer 50 (L) >60 mL/min   GFR calc Af Amer 58 (L) >60 mL/min    Comment: (NOTE) The eGFR has been calculated using the CKD EPI equation. This calculation has not been validated in all clinical situations. eGFR's persistently <60 mL/min signify possible Chronic Kidney Disease.    Anion gap 10 5 - 15  ECHOCARDIOGRAM COMPLETE     Status: None   Collection Time: 05/09/16  9:56 AM  Result Value Ref Range   Weight 2,476.21 oz   Height 62 in   BP 159/64 mmHg    RADIOGRAPHIC STUDIES: I have personally reviewed the radiological images as listed and agreed with the findings in the  report. Dg Chest 2 View  Result Date: 05/08/2016 CLINICAL DATA:  Patient found on floor. Nonproductive cough and sore throat, subacute onset. Generalized weakness, acute onset. Initial encounter. EXAM: CHEST  2 VIEW COMPARISON:  Chest radiograph performed 07/05/2013 FINDINGS: The lungs are well-aerated. Minimal bibasilar atelectasis is noted.  There is no evidence of pleural effusion or pneumothorax. The heart is borderline normal in size. Scattered calcification is seen along the thoracic and proximal abdominal aorta. No acute osseous abnormalities are seen. Mild degenerative change is noted at the right glenohumeral joint. IMPRESSION: 1. Minimal bibasilar atelectasis noted. Lungs otherwise grossly clear. 2. Scattered aortic atherosclerosis. Electronically Signed   By: Garald Balding M.D.   On: 05/08/2016 19:34   Dg Pelvis 1-2 Views  Result Date: 05/08/2016 CLINICAL DATA:  Status post unwitnessed fall, with concern for pelvic injury. Initial encounter. EXAM: PELVIS - 1-2 VIEW COMPARISON:  None. FINDINGS: There is no evidence of fracture or dislocation. Both femoral heads are seated normally within their respective acetabula. Mild degenerative change is noted at the lower lumbar spine. The sacroiliac joints are grossly unremarkable. The visualized bowel gas pattern is grossly unremarkable in appearance. IMPRESSION: No evidence of fracture or dislocation. Electronically Signed   By: Garald Balding M.D.   On: 05/08/2016 19:35   Dg Elbow Complete Left  Result Date: 05/08/2016 CLINICAL DATA:  Status post unwitnessed fall, with left elbow laceration and pain. Initial encounter. EXAM: LEFT ELBOW - COMPLETE 3+ VIEW COMPARISON:  None. FINDINGS: There is no evidence of fracture or dislocation. The visualized joint spaces are preserved. No significant joint effusion is identified. The known soft tissue laceration is not well characterized on radiograph. IMPRESSION: No evidence of fracture or dislocation. Electronically  Signed   By: Garald Balding M.D.   On: 05/08/2016 19:35   Ct Head Wo Contrast  Result Date: 05/08/2016 CLINICAL DATA:  Unwitnessed fall. EXAM: CT HEAD WITHOUT CONTRAST CT CERVICAL SPINE WITHOUT CONTRAST TECHNIQUE: Multidetector CT imaging of the head and cervical spine was performed following the standard protocol without intravenous contrast. Multiplanar CT image reconstructions of the cervical spine were also generated. COMPARISON:  None. FINDINGS: CT HEAD FINDINGS Brain: No evidence of parenchymal hemorrhage or extra-axial fluid collection. No mass lesion, mass effect, or midline shift. No CT evidence of acute infarction. Nonspecific prominent subcortical and periventricular white matter hypodensity, most in keeping with chronic small vessel ischemic change. Generalized cerebral volume loss. No ventriculomegaly. Vascular: No hyperdense vessel or unexpected calcification. Skull: No evidence of calvarial fracture. Sinuses/Orbits: Mucoperiosteal thickening throughout the bilateral ethmoidal air cells and visualized bilateral maxillary sinuses with hyperostosis of the visualized right greater than left maxillary sinus walls. No fluid levels in the visualized paranasal sinuses. Other: Mild left anterior frontal scalp contusion. The mastoid air cells are unopacified. CT CERVICAL SPINE FINDINGS Alignment: Straightening of the cervical spine. Minimal 2 mm anterolisthesis at C2-3. Otherwise no subluxation. Dens is well positioned between the lateral masses of C1. Skull base and vertebrae: No acute fracture. No primary bone lesion or focal pathologic process. Soft tissues and spinal canal: No prevertebral fluid or swelling. No visible canal hematoma. Disc levels: Severe degenerative disc disease throughout the cervical spine. Moderate bilateral facet arthropathy, most prominent in the left upper cervical spine. Moderate degenerate foraminal stenosis on the right at C3-4. Mild degenerate foraminal stenosis on the left at  C4-5. Moderate bilateral degenerative foraminal stenosis at C5-6. Moderate degenerate foraminal stenosis on the right at C6-7. Upper chest: Negative. Other: Visualized mastoid air cells appear clear. No discrete thyroid nodules. Symmetric bilateral cervical lymphadenopathy involving levels 2 through 5 bilaterally, measuring up to 1.1 cm on the right at level to (series 8/ image 34) and 1.1 cm on the left at level 3 (series 8/ image 64). IMPRESSION: 1. Mild left anterior frontal  scalp contusion. 2. No evidence of acute intracranial abnormality. No evidence of calvarial fracture. 3. Chronic paranasal sinusitis. 4. No cervical spine fracture. 5. Severe degenerative changes in the cervical spine as described. Minimal 2 mm anterolisthesis at C2-3 is probably degenerative. 6. Symmetric bilateral neck lymphadenopathy, nonspecific, cannot exclude a lymphoproliferative disorder. Management options include a follow-up neck CT in 3 months versus tissue sampling, as clinically warranted. Electronically Signed   By: Ilona Sorrel M.D.   On: 05/08/2016 19:40   Ct Angio Chest Pe W And/or Wo Contrast  Result Date: 05/08/2016 CLINICAL DATA:  Syncope and hypoxia.  Unwitnessed fall. EXAM: CT ANGIOGRAPHY CHEST WITH CONTRAST TECHNIQUE: Multidetector CT imaging of the chest was performed using the standard protocol during bolus administration of intravenous contrast. Multiplanar CT image reconstructions and MIPs were obtained to evaluate the vascular anatomy. CONTRAST:  100 mL Isovue 370 intravenous COMPARISON:  Radiographs 05/08/2016 FINDINGS: Cardiovascular: Satisfactory opacification of the pulmonary arteries to the segmental level. No evidence of pulmonary embolism. Normal heart size. No pericardial effusion. Mediastinum/Nodes: Numerous pathologically enlarged nodes in the mediastinum, hila, axillary regions, supraclavicular regions and upper abdomen. Many of the nodes measure well over 2 cm in short axis. Esophagus is  unremarkable.  Small hiatal hernia. Lungs/Pleura: Lungs are clear. No pleural effusion or pneumothorax. Upper Abdomen: Low-attenuation liver lesions, incompletely imaged but most likely benign cysts. By report these are larger than on 03/30/2002. Numerous enlarged nodes in the splenic hilum and peripancreatic region. Spleen is generous in size but incompletely imaged. No focal lesions are evident in the included portions of the spleen. Musculoskeletal: No significant skeletal lesion. Moderate chronic appearing compression of T4. Review of the MIP images confirms the above findings. IMPRESSION: 1. Negative for acute pulmonary embolism 2. Numerous pathologic nodes in the mediastinum, hila, axillae, supraclavicular regions and upper abdomen. The adenopathy is suspicious for malignancy such as lymphoma or CLL. If tissue diagnosis is desired, many of the supraclavicular and axillary nodes should be palpable and accessible to excisional biopsy. 3. Low-attenuation liver lesions, incompletely imaged and not characterized but more likely benign. 4. Small hiatal hernia. 5. These results will be called to the ordering clinician or representative by the Radiologist Assistant, and communication documented in the PACS or zVision Dashboard. Electronically Signed   By: Andreas Newport M.D.   On: 05/08/2016 22:18   Ct Cervical Spine Wo Contrast  Result Date: 05/08/2016 CLINICAL DATA:  Unwitnessed fall. EXAM: CT HEAD WITHOUT CONTRAST CT CERVICAL SPINE WITHOUT CONTRAST TECHNIQUE: Multidetector CT imaging of the head and cervical spine was performed following the standard protocol without intravenous contrast. Multiplanar CT image reconstructions of the cervical spine were also generated. COMPARISON:  None. FINDINGS: CT HEAD FINDINGS Brain: No evidence of parenchymal hemorrhage or extra-axial fluid collection. No mass lesion, mass effect, or midline shift. No CT evidence of acute infarction. Nonspecific prominent subcortical and  periventricular white matter hypodensity, most in keeping with chronic small vessel ischemic change. Generalized cerebral volume loss. No ventriculomegaly. Vascular: No hyperdense vessel or unexpected calcification. Skull: No evidence of calvarial fracture. Sinuses/Orbits: Mucoperiosteal thickening throughout the bilateral ethmoidal air cells and visualized bilateral maxillary sinuses with hyperostosis of the visualized right greater than left maxillary sinus walls. No fluid levels in the visualized paranasal sinuses. Other: Mild left anterior frontal scalp contusion. The mastoid air cells are unopacified. CT CERVICAL SPINE FINDINGS Alignment: Straightening of the cervical spine. Minimal 2 mm anterolisthesis at C2-3. Otherwise no subluxation. Dens is well positioned between the lateral masses of  C1. Skull base and vertebrae: No acute fracture. No primary bone lesion or focal pathologic process. Soft tissues and spinal canal: No prevertebral fluid or swelling. No visible canal hematoma. Disc levels: Severe degenerative disc disease throughout the cervical spine. Moderate bilateral facet arthropathy, most prominent in the left upper cervical spine. Moderate degenerate foraminal stenosis on the right at C3-4. Mild degenerate foraminal stenosis on the left at C4-5. Moderate bilateral degenerative foraminal stenosis at C5-6. Moderate degenerate foraminal stenosis on the right at C6-7. Upper chest: Negative. Other: Visualized mastoid air cells appear clear. No discrete thyroid nodules. Symmetric bilateral cervical lymphadenopathy involving levels 2 through 5 bilaterally, measuring up to 1.1 cm on the right at level to (series 8/ image 34) and 1.1 cm on the left at level 3 (series 8/ image 64). IMPRESSION: 1. Mild left anterior frontal scalp contusion. 2. No evidence of acute intracranial abnormality. No evidence of calvarial fracture. 3. Chronic paranasal sinusitis. 4. No cervical spine fracture. 5. Severe degenerative  changes in the cervical spine as described. Minimal 2 mm anterolisthesis at C2-3 is probably degenerative. 6. Symmetric bilateral neck lymphadenopathy, nonspecific, cannot exclude a lymphoproliferative disorder. Management options include a follow-up neck CT in 3 months versus tissue sampling, as clinically warranted. Electronically Signed   By: Ilona Sorrel M.D.   On: 05/08/2016 19:40    ASSESSMENT & PLAN  Lymphocytosis, diffuse lymphadenopathy, suspicious for CLL I will order flow cytometry on Monday morning. Overall picture is compatible with CLL In comparison to her blood work dated 3 years ago, she had minimum increment of total white blood cell count. Overall, it is compatible with stage I disease She likely can be observed I do not feel strongly she needs excisional lymph node biopsy for now  Influenza infection Continue treatment and supportive care  Recent falls, weakness Likely related to influenza and not from lymphoproliferative disorder  Recent weight loss Recommend dietitian consult  Discharge planning She is profoundly weak It is not clear that she can return back to home as the patient lives alone I have attempted to call her daughter but unable to get hold of her I will return to check on her again this weekend  All questions were answered. The patient knows to call the clinic with any problems, questions or concerns.    Heath Lark, MD 05/09/2016 4:34 PM

## 2016-05-09 NOTE — ED Notes (Signed)
Patient denies pain and is resting comfortably.  

## 2016-05-09 NOTE — Plan of Care (Signed)
Problem: Education: Goal: Knowledge of  General Education information/materials will improve Outcome: Progressing .  Problem: Pain Managment: Goal: General experience of comfort will improve Outcome: Progressing Denies pain at this time.  Will continue to monitor.   Problem: Physical Regulation: Goal: Will remain free from infection Continue.   Flu +.  Elevated WBC's.    Problem: Skin Integrity: Goal: Risk for impaired skin integrity will decrease Continue.  Abrasions noted to left arm.  Problem: Nutrition: Goal: Adequate nutrition will be maintained Unsure what baseline intake is like, but pt refused breakfast and said she would have half of a sandwich for lunch and some soup for dinner.  She states that she does not eat much and this is her norm.  Will continue to monitor and refer to nutrition/ dietician if appropriate.

## 2016-05-09 NOTE — Progress Notes (Signed)
PHARMACY NOTE:  ANTIMICROBIAL RENAL DOSAGE ADJUSTMENT  Current antimicrobial regimen includes a mismatch between antimicrobial dosage and estimated renal function.  As per policy approved by the Pharmacy & Therapeutics and Medical Executive Committees, the antimicrobial dosage will be adjusted accordingly.  Current antimicrobial dosage:  Tamiflu 73m daily  Indication: Influenza positive  Renal Function:  Estimated Creatinine Clearance: 36.7 mL/min (by C-G formula based on SCr of 0.99 mg/dL). []      On intermittent HD, scheduled: []      On CRRT    Antimicrobial dosage has been changed to:  340mBID  Additional comments:   Thank you for allowing pharmacy to be a part of this patient's care.  ErPeggyann JubaPharmD, BCPS Pager: [phone]/05/2016 2:08 PM

## 2016-05-09 NOTE — Progress Notes (Signed)
  Echocardiogram 2D Echocardiogram has been performed.  Carla Conway M 05/09/2016, 9:56 AM

## 2016-05-09 NOTE — H&P (Addendum)
[facility] History and Physical  Reisa Coppola Pra WER:[id number] DOB: Jun 27, 1926 DOA: 05/08/2016  Referring physician: Clayton Bibles, PA, ED [facility] PCP: Vena Austria, MD   Chief Complaint: Fall, found on ground  HPI: Carla Conway is a 83 y.o. female with history of  HTN, CAD, HL who presented to ED today after being found on the floor at her apartment ([place]) by her family.  Brought to ED where pt has cough and confusion, flu A swab is + and WBC 38K.  Pt had CT of neck/ head / chest and these studies are showing lots of lymphadenopathy in the chest, axillae and neck.  Possible CLL vs lymphoma.  CXR was negative. No fever. Asked to see for admission for fall/ flu, and possible cancer.   Patient is vague historian, doesn't remember events of earlier today at all.  Describes 2- 3 days of cough and sore throat , and very tired and worn out.  NO n/v/d.  No HA.  Bruised /abbraised her L arm with the fall.  Says that yesterday she was fine , her and her daughter were hanging pictures.    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  Married twice (27 and 25 yrs), both husbands have died.  5 children.  No tobacco or etoh.       ROS  denies CP  no joint pain   no HA  no blurry vision  no rash  no diarrhea  no nausea/ vomiting  no dysuria  no difficulty voiding  no change in urine color    Past Medical History  Past Medical History:  Diagnosis Date  . Asthma   . CAD (coronary artery disease)   . Hyperlipidemia   . Hypertension    Past Surgical History  Past Surgical History:  Procedure Laterality Date  . CATARACT EXTRACTION    . CORONARY ANGIOPLASTY  10 -15 years ago   stent in [place] ,[place]   Family History  Family History  Problem Relation Age of Onset  . Cancer Father     colon   Social History  reports that she has quit smoking. Her smoking use included Cigarettes. She started smoking  about 33 years ago. She has never used smokeless tobacco. She reports that she does not drink alcohol or use drugs. Allergies No Known Allergies Home medications Prior to Admission medications   Medication Sig Start Date End Date Taking? Authorizing Provider  COMBIVENT RESPIMAT 20-100 MCG/ACT AERS respimat Take 2 puffs by mouth every 6 (six) hours as needed.  04/16/13  Yes Historical Provider, MD  irbesartan (AVAPRO) 300 MG tablet Take 300 mg by mouth daily.  05/09/13  Yes Historical Provider, MD  isosorbide mononitrate (IMDUR) 30 MG 24 hr tablet Take 30 mg by mouth daily.  05/09/13  Yes Historical Provider, MD  lovastatin (MEVACOR) 40 MG tablet Take 40 mg by mouth every evening.  05/09/13  Yes Historical Provider, MD  metoprolol succinate (TOPROL-XL) 100 MG 24 hr tablet Take 100 mg by mouth daily.  05/02/13  Yes Historical Provider, MD  montelukast (SINGULAIR) 10 MG tablet Take 10 mg by mouth every evening.  05/09/13  Yes Historical Provider, MD  potassium chloride (K-DUR) 10 MEQ tablet Take 10 mEq by mouth daily. 03/24/16  Yes Historical Provider, MD  QUEtiapine (SEROQUEL) 25 MG tablet Take 25 mg by mouth 2 (two) times daily.   Yes Historical Provider, MD  triamterene-hydrochlorothiazide [facility]) 37.5-25  MG tablet Take 1 tablet by mouth daily.    Yes Historical Provider, MD   Liver Function Tests No results for input(s): AST, ALT, ALKPHOS, BILITOT, PROT, ALBUMIN in the last 168 hours. No results for input(s): LIPASE, AMYLASE in the last 168 hours. CBC  Recent Labs Lab 05/08/16 2004  WBC 38.3*  NEUTROABS 5.4  HGB 12.1  HCT 34.9*  MCV 86.0  PLT 314   Basic Metabolic Panel  Recent Labs Lab 05/08/16 2004  NA 127*  K 3.3*  CL 91*  CO2 24  GLUCOSE 119*  BUN 15  CREATININE 0.99  CALCIUM 8.7*     Vitals:   05/08/16 2118 05/08/16 2239 05/08/16 2245 05/08/16 2330  BP: 153/64 149/61 160/65 155/66  Pulse: 104 80 82 77  Resp: 22 16 19 25   Temp: 98.8 F (37.1 C)     TempSrc: Oral      SpO2: 93% 99% 94% 95%   Exam: Gen elderly disheveled WF, no distress, raspy wet cough No rash, cyanosis or gangrene Sclera anicteric, throat clear and dry  No jvd or bruits, large palpable nodes ant chain bilat neck R > L Chest scattered mild exp wheezing and some insp crackles RRR no MRG Abd soft ntnd no mass or ascites +bs obese GU defer MS no joint effusions or deformity Ext no LE or UE edema / no wounds or ulcers Neuro is alert, Ox 1 "hospital", "[place], [place]", "2001", no focal deficits, moves all ext   Na 127  K 3.3  BUN 15  Cr 0.99   Glu 119    WBC 38k 69% lymphs   Hb 12  plt 210 Flu A+ UA negative  EKG (independ reviewed) > NSR 94 bpm, no acute CXR (independ reviewed) > no acute changes  CT Head, neck / C-spine: 1. Mild left anterior frontal scalp contusion. 2. No evidence of acute intracranial abnormality. No evidence of calvarial fracture. 3. Chronic paranasal sinusitis. 4. No cervical spine fracture. 5. Severe degenerative changes in the cervical spine as described. Minimal 2 mm anterolisthesis at C2-3 is probably degenerative. 6. Symmetric bilateral neck lymphadenopathy, nonspecific, cannot exclude a lymphoproliferative disorder. Management options include a follow-up neck CT in 3 months versus tissue sampling, as clinically Warranted.  CT angio chest:  1. Negative for acute pulmonary embolism 2. Numerous pathologic nodes in the mediastinum, hila, axillae, supraclavicular regions and upper abdomen. The adenopathy is suspicious for malignancy such as lymphoma or CLL. If tissue diagnosis is desired, many of the supraclavicular and axillary nodes should be palpable and accessible to excisional biopsy. 3. Low-attenuation liver lesions, incompletely imaged and not characterized but more likely benign. 4. Small hiatal hernia.  Assessment: 1. Fall / possible syncopal episode - check echo. Prob poor surgical candidate , not sure would chase carotid  disease 2. Influenza / cough - viral bronchitis, CXR clear 3. Diffuse lymphadenopathy on CT of chest / neck - along with ^^WBC may be CLL or lymphoma 4. HTN - on ARB and maxzide; hold diuretics 5. HL on statin 6. COPD/ asthma - on combivent respimat 7. Hyponatremia - prob hypovolemic 8. Debility - looks weak  Plan - admit, IVF"s, PT eval in am, consider ONC eval/ input.     Roney Jaffe D [facility] Pager [phone]   If 7PM-7AM, please contact night-coverage [email or web address] Password [email or web address] 05/09/2016, 12:04 AM

## 2016-05-10 DIAGNOSIS — R599 Enlarged lymph nodes, unspecified: Secondary | ICD-10-CM

## 2016-05-10 DIAGNOSIS — R590 Localized enlarged lymph nodes: Secondary | ICD-10-CM

## 2016-05-10 DIAGNOSIS — J101 Influenza due to other identified influenza virus with other respiratory manifestations: Principal | ICD-10-CM

## 2016-05-10 DIAGNOSIS — I5033 Acute on chronic diastolic (congestive) heart failure: Secondary | ICD-10-CM

## 2016-05-10 DIAGNOSIS — J431 Panlobular emphysema: Secondary | ICD-10-CM

## 2016-05-10 DIAGNOSIS — W19XXXA Unspecified fall, initial encounter: Secondary | ICD-10-CM

## 2016-05-10 DIAGNOSIS — J438 Other emphysema: Secondary | ICD-10-CM

## 2016-05-10 DIAGNOSIS — J111 Influenza due to unidentified influenza virus with other respiratory manifestations: Secondary | ICD-10-CM

## 2016-05-10 LAB — BASIC METABOLIC PANEL
Anion gap: 9 (ref 5–15)
BUN: 10 mg/dL (ref 6–20)
CALCIUM: 8.3 mg/dL — AB (ref 8.9–10.3)
CHLORIDE: 97 mmol/L — AB (ref 101–111)
CO2: 26 mmol/L (ref 22–32)
CREATININE: 1.03 mg/dL — AB (ref 0.44–1.00)
GFR calc non Af Amer: 47 mL/min — ABNORMAL LOW (ref >60)
GFR, EST AFRICAN AMERICAN: 55 mL/min — AB (ref >60)
Glucose, Bld: 108 mg/dL — ABNORMAL HIGH (ref 65–99)
Potassium: 3.3 mmol/L — ABNORMAL LOW (ref 3.5–5.1)
Sodium: 132 mmol/L — ABNORMAL LOW (ref 135–145)

## 2016-05-10 MED ORDER — FUROSEMIDE 10 MG/ML IJ SOLN
20.0000 mg | Freq: Once | INTRAMUSCULAR | Status: AC
  Administered 2016-05-10: 20 mg via INTRAVENOUS
  Filled 2016-05-10: qty 2

## 2016-05-10 MED ORDER — METHYLPREDNISOLONE SODIUM SUCC 125 MG IJ SOLR
60.0000 mg | Freq: Four times a day (QID) | INTRAMUSCULAR | Status: DC
  Administered 2016-05-10 – 2016-05-11 (×5): 60 mg via INTRAVENOUS
  Filled 2016-05-10 (×5): qty 2

## 2016-05-10 MED ORDER — DOXYCYCLINE HYCLATE 100 MG PO TABS
100.0000 mg | ORAL_TABLET | Freq: Two times a day (BID) | ORAL | Status: DC
  Administered 2016-05-10 – 2016-05-11 (×3): 100 mg via ORAL
  Filled 2016-05-10 (×3): qty 1

## 2016-05-10 MED ORDER — IPRATROPIUM-ALBUTEROL 0.5-2.5 (3) MG/3ML IN SOLN
3.0000 mL | Freq: Four times a day (QID) | RESPIRATORY_TRACT | Status: DC
  Administered 2016-05-10 – 2016-05-11 (×4): 3 mL via RESPIRATORY_TRACT
  Filled 2016-05-10 (×5): qty 3

## 2016-05-10 NOTE — Evaluation (Signed)
Physical Therapy Evaluation Patient Details Name: Carla Conway MRN: [id number] DOB: Sep 25, 1926 Today's Date: 05/10/2016   History of Present Illness  83 y.o. female with history of  HTN, CAD, HL who presented to ED after being found on the floor at her apartment ([place]) by her family.  Brought to ED where pt has cough and confusion, flu A swab is positive.   Clinical Impression  Pt presenting with decreased safety awareness and increased fall risk during PT session. Pt grossly mobilizing at min guard level but requiring frequent assist for safety. The pt is oriented during session but easily confused and giving contradictory information during home situation and prior function. At this time, recommending 24 hour supervision due to confusion and poor safety awareness. No family present to confirm baseline or available assistance at discharge. PT to follow and modify recommendations as needed depending on progress with mobility and safety awareness.     Follow Up Recommendations Supervision/Assistance - 24 hour. Home with family assist. Pt reports that she can stay with her daughter at discharge but no family present to confirm.       None recommended by PT    Recommendations for Other Services       Precautions / Restrictions Precautions Precautions: Fall Restrictions Weight Bearing Restrictions: No      Mobility  Bed Mobility Overal bed mobility: Needs Assistance Bed Mobility: Supine to Sit     Supine to sit: Min guard;HOB elevated     General bed mobility comments: cues needed for sequence to get out of bed.   Transfers Overall transfer level: Needs assistance Equipment used: Rolling walker (2 wheeled) Transfers: Sit to/from Stand Sit to Stand: Min guard         General transfer comment: guard needed for safety. Transfers performed from bed and toilet.   Ambulation/Gait Ambulation/Gait assistance: Min guard Ambulation Distance (Feet): 40 Feet Assistive device:  Rolling walker (2 wheeled) Gait Pattern/deviations: Step-through pattern;Decreased step length - right;Decreased step length - left;Trunk flexed Gait velocity: decreased   General Gait Details: cues and occasional physical assist needed for safety, repeated verbal cues needed as well.   Stairs            Wheelchair Mobility    Modified Rankin (Stroke Patients Only)       Balance Overall balance assessment: Needs assistance Sitting-balance support: No upper extremity supported Sitting balance-Leahy Scale: Good     Standing balance support: Bilateral upper extremity supported Standing balance-Leahy Scale: Fair Standing balance comment: using rw for support, able to stand at sink to wash hands.                              Pertinent Vitals/Pain Pain Assessment: No/denies pain    Home Living Family/patient expects to be discharged to:: Unsure Living Arrangements: Alone Available Help at Discharge:  (pt uncertain) Type of Home: House Home Access: Level entry     Home Layout: One level Home Equipment: Walker - 2 wheels;Cane - single point Additional Comments: No family present to confirm pt reports of living situation. Pt becoming confused regarding her previous home in [place], her current home, and her daughter's home.     Prior Function Level of Independence: Independent with assistive device(s)         Comments: pt reports living independently and performing all ADLs independently. No family present to confirm PLF     Hand Dominance  Extremity/Trunk Assessment   Upper Extremity Assessment Upper Extremity Assessment: Generalized weakness    Lower Extremity Assessment Lower Extremity Assessment: Generalized weakness       Communication   Communication: No difficulties  Cognition Arousal/Alertness: Awake/alert Behavior During Therapy: WFL for tasks assessed/performed Overall Cognitive Status: No family/caregiver present to  determine baseline cognitive functioning                 General Comments: Intermittent confusion noted throughout session, oriented to situation and date but confused regarding home and inconsistent with cues. Deficits of safety awareness noted throughout session.     General Comments      Exercises     Assessment/Plan    PT Assessment Patient needs continued PT services  PT Problem List Decreased strength;Decreased range of motion;Decreased activity tolerance;Decreased balance;Decreased mobility;Decreased safety awareness          PT Treatment Interventions DME instruction;Gait training;Stair training;Functional mobility training;Therapeutic activities;Therapeutic exercise;Cognitive remediation;Patient/family education    PT Goals (Current goals can be found in the Care Plan section)  Acute Rehab PT Goals Patient Stated Goal: go home PT Goal Formulation: With patient Time For Goal Achievement: 05/24/16 Potential to Achieve Goals: Fair    Frequency Min 3X/week   Barriers to discharge        Co-evaluation               End of Session Equipment Utilized During Treatment: Gait belt Activity Tolerance: Patient tolerated treatment well Patient left: in chair;with call bell/phone within reach;with chair alarm set Nurse Communication: Mobility status         Time: 9244-6286 PT Time Calculation (min) (ACUTE ONLY): 37 min   Charges:   PT Evaluation $PT Eval Moderate Complexity: 1 Procedure PT Treatments $Therapeutic Activity: 8-22 mins   PT G Codes:        Cassell Clement, PT, CSCS Pager [phone] Office [phone]  05/10/2016, 3:34 PM

## 2016-05-10 NOTE — Progress Notes (Signed)
[facility]  PROGRESS NOTE  Carla Conway Pra RJJ:[id number] DOB: 03/13/1927 DOA: 05/08/2016 PCP: Vena Austria, MD   Brief HPI:   83 y.o.femalewith history of HTN, CAD, HL who presented to ED today after being found on the floor at her apartment ([place]) by her family. Brought to ED where pt has cough and confusion, flu A swab is + and WBC 38K. Pt had CT of neck/ head / chest and these studies are showing lots of lymphadenopathy in the chest, axillae and neck. Possible CLL vs lymphoma. CXR was negative. No fever.     Subjective   Patient continues to complain of shortness of breath with coughing up green colored phlegm   Assessment/Plan:     1. Influenza A with upper respiratory infection/acute bronchitis-continue Tamiflu, will start doxycycline 100 mg twice a day, Solu-Medrol 60 mg IV every 6 hours, change DuoNeb to every 6 hours. 2. COPD- as above continue DuoNeb, Solu-Medrol 3. Fall- patient came to hospital with fall, questionable syncope. Echo shows grade 2 diastolic dysfunction. We'll obtain PT OT consultation 4. Acute on chronic diastolic CHF- echocardiogram shows grade 2 diastolic dysfunction, will give 1 dose of Lasix 20 mg IV. Follow BMP in a.m. 5. Hypokalemia- potassium was replaced yesterday, will check BMP today 6. Diffuse lymphadenopathy on CT chest/? CLL-CT chest showed numerous pathologic nodes in the mediastinum, hilum, axillae, supraclavicular regions and upper abdomen. Adenopathy suspicious for malignancy such as lymphoma or CLL. Oncology was consulted, no need for biopsy during this hospitalization. Patient to follow-up oncology as outpatient. Flow cytometry has been ordered 7. Hypertension-blood pressure stable, continue metoprolol, Avapro    DVT prophylaxis: Lovenox  Code Status: Full code  Family Communication: *No family present at bedside  Disposition Plan: We will get PT  evaluation   Consultants:  None  Procedures:  None  Continuous infusions . sodium chloride 10 mL/hr at 05/10/16 0933      Antibiotics:   Anti-infectives    Start     Dose/Rate Route Frequency Ordered Stop   05/10/16 1000  oseltamivir (TAMIFLU) capsule 75 mg  Status:  Discontinued     75 mg Oral Daily 05/09/16 0033 05/09/16 1410   05/10/16 1000  doxycycline (VIBRA-TABS) tablet 100 mg     100 mg Oral Every 12 hours 05/10/16 0829     05/09/16 1500  oseltamivir (TAMIFLU) capsule 30 mg     30 mg Oral 2 times daily 05/09/16 1410 05/13/16 2159   05/08/16 2215  oseltamivir (TAMIFLU) capsule 75 mg     75 mg Oral NOW 05/08/16 2208 05/08/16 2237       Objective   Vitals:   05/09/16 2013 05/09/16 2030 05/10/16 0608 05/10/16 0743  BP:  133/64 (!) 170/68   Pulse: 74 73 75   Resp: 20 (!) 26 (!) 24   Temp:  98.9 F (37.2 C) 98.5 F (36.9 C)   TempSrc:  Oral Oral   SpO2: 95% 99% 94% 94%  Weight:      Height:        Intake/Output Summary (Last 24 hours) at 05/10/16 1041 Last data filed at 05/10/16 0200  Gross per 24 hour  Intake             1565 ml  Output                0 ml  Net             1565 ml   Filed Weights   05/09/16  0110  Weight: 70.2 kg (154 lb 12.2 oz)     Physical Examination:  General exam: Appears calm and comfortable. Respiratory system: Bilateral rhonchi Cardiovascular system:  RRR. No  murmurs, rubs, gallops. No pedal edema. GI system: Abdomen is nondistended, soft and nontender. No organomegaly.  Central nervous system. No focal neurological deficits. 5 x 5 power in all extremities. Skin: No rashes, lesions or ulcers. Psychiatry: Alert, oriented x 3.Judgement and insight appear normal. Affect normal.    Data Reviewed: I have personally reviewed following labs and imaging studies  CBG: No results for input(s): GLUCAP in the last 168 hours.  CBC:  Recent Labs Lab 05/08/16 2004 05/09/16 0550  WBC 38.3* 35.9*  NEUTROABS 5.4  --    HGB 12.1 10.4*  HCT 34.9* 29.8*  MCV 86.0 88.4  PLT 210 381    Basic Metabolic Panel:  Recent Labs Lab 05/08/16 2004 05/09/16 0550  NA 127* 130*  K 3.3* 3.0*  CL 91* 95*  CO2 24 25  GLUCOSE 119* 97  BUN 15 13  CREATININE 0.99 0.99  CALCIUM 8.7* 8.4*    Recent Results (from the past 240 hour(s))  MRSA PCR Screening     Status: None   Collection Time: 05/09/16  1:24 AM  Result Value Ref Range Status   MRSA by PCR NEGATIVE NEGATIVE Final    Comment:        The GeneXpert MRSA Assay (FDA approved for NASAL specimens only), is one component of a comprehensive MRSA colonization surveillance program. It is not intended to diagnose MRSA infection nor to guide or monitor treatment for MRSA infections.      Liver Function Tests: No results for input(s): AST, ALT, ALKPHOS, BILITOT, PROT, ALBUMIN in the last 168 hours. No results for input(s): LIPASE, AMYLASE in the last 168 hours. No results for input(s): AMMONIA in the last 168 hours.  Cardiac Enzymes:  Recent Labs Lab 05/08/16 2004  CKTOTAL 349*   BNP (last 3 results) No results for input(s): BNP in the last 8760 hours.  ProBNP (last 3 results) No results for input(s): PROBNP in the last 8760 hours.    Studies: Dg Chest 2 View  Result Date: 05/08/2016 CLINICAL DATA:  Patient found on floor. Nonproductive cough and sore throat, subacute onset. Generalized weakness, acute onset. Initial encounter. EXAM: CHEST  2 VIEW COMPARISON:  Chest radiograph performed 07/05/2013 FINDINGS: The lungs are well-aerated. Minimal bibasilar atelectasis is noted. There is no evidence of pleural effusion or pneumothorax. The heart is borderline normal in size. Scattered calcification is seen along the thoracic and proximal abdominal aorta. No acute osseous abnormalities are seen. Mild degenerative change is noted at the right glenohumeral joint. IMPRESSION: 1. Minimal bibasilar atelectasis noted. Lungs otherwise grossly clear. 2.  Scattered aortic atherosclerosis. Electronically Signed   By: Garald Balding M.D.   On: 05/08/2016 19:34   Dg Pelvis 1-2 Views  Result Date: 05/08/2016 CLINICAL DATA:  Status post unwitnessed fall, with concern for pelvic injury. Initial encounter. EXAM: PELVIS - 1-2 VIEW COMPARISON:  None. FINDINGS: There is no evidence of fracture or dislocation. Both femoral heads are seated normally within their respective acetabula. Mild degenerative change is noted at the lower lumbar spine. The sacroiliac joints are grossly unremarkable. The visualized bowel gas pattern is grossly unremarkable in appearance. IMPRESSION: No evidence of fracture or dislocation. Electronically Signed   By: Garald Balding M.D.   On: 05/08/2016 19:35   Dg Elbow Complete Left  Result Date: 05/08/2016 CLINICAL  DATA:  Status post unwitnessed fall, with left elbow laceration and pain. Initial encounter. EXAM: LEFT ELBOW - COMPLETE 3+ VIEW COMPARISON:  None. FINDINGS: There is no evidence of fracture or dislocation. The visualized joint spaces are preserved. No significant joint effusion is identified. The known soft tissue laceration is not well characterized on radiograph. IMPRESSION: No evidence of fracture or dislocation. Electronically Signed   By: Garald Balding M.D.   On: 05/08/2016 19:35   Ct Head Wo Contrast  Result Date: 05/08/2016 CLINICAL DATA:  Unwitnessed fall. EXAM: CT HEAD WITHOUT CONTRAST CT CERVICAL SPINE WITHOUT CONTRAST TECHNIQUE: Multidetector CT imaging of the head and cervical spine was performed following the standard protocol without intravenous contrast. Multiplanar CT image reconstructions of the cervical spine were also generated. COMPARISON:  None. FINDINGS: CT HEAD FINDINGS Brain: No evidence of parenchymal hemorrhage or extra-axial fluid collection. No mass lesion, mass effect, or midline shift. No CT evidence of acute infarction. Nonspecific prominent subcortical and periventricular white matter hypodensity,  most in keeping with chronic small vessel ischemic change. Generalized cerebral volume loss. No ventriculomegaly. Vascular: No hyperdense vessel or unexpected calcification. Skull: No evidence of calvarial fracture. Sinuses/Orbits: Mucoperiosteal thickening throughout the bilateral ethmoidal air cells and visualized bilateral maxillary sinuses with hyperostosis of the visualized right greater than left maxillary sinus walls. No fluid levels in the visualized paranasal sinuses. Other: Mild left anterior frontal scalp contusion. The mastoid air cells are unopacified. CT CERVICAL SPINE FINDINGS Alignment: Straightening of the cervical spine. Minimal 2 mm anterolisthesis at C2-3. Otherwise no subluxation. Dens is well positioned between the lateral masses of C1. Skull base and vertebrae: No acute fracture. No primary bone lesion or focal pathologic process. Soft tissues and spinal canal: No prevertebral fluid or swelling. No visible canal hematoma. Disc levels: Severe degenerative disc disease throughout the cervical spine. Moderate bilateral facet arthropathy, most prominent in the left upper cervical spine. Moderate degenerate foraminal stenosis on the right at C3-4. Mild degenerate foraminal stenosis on the left at C4-5. Moderate bilateral degenerative foraminal stenosis at C5-6. Moderate degenerate foraminal stenosis on the right at C6-7. Upper chest: Negative. Other: Visualized mastoid air cells appear clear. No discrete thyroid nodules. Symmetric bilateral cervical lymphadenopathy involving levels 2 through 5 bilaterally, measuring up to 1.1 cm on the right at level to (series 8/ image 34) and 1.1 cm on the left at level 3 (series 8/ image 64). IMPRESSION: 1. Mild left anterior frontal scalp contusion. 2. No evidence of acute intracranial abnormality. No evidence of calvarial fracture. 3. Chronic paranasal sinusitis. 4. No cervical spine fracture. 5. Severe degenerative changes in the cervical spine as described.  Minimal 2 mm anterolisthesis at C2-3 is probably degenerative. 6. Symmetric bilateral neck lymphadenopathy, nonspecific, cannot exclude a lymphoproliferative disorder. Management options include a follow-up neck CT in 3 months versus tissue sampling, as clinically warranted. Electronically Signed   By: Ilona Sorrel M.D.   On: 05/08/2016 19:40   Ct Angio Chest Pe W And/or Wo Contrast  Result Date: 05/08/2016 CLINICAL DATA:  Syncope and hypoxia.  Unwitnessed fall. EXAM: CT ANGIOGRAPHY CHEST WITH CONTRAST TECHNIQUE: Multidetector CT imaging of the chest was performed using the standard protocol during bolus administration of intravenous contrast. Multiplanar CT image reconstructions and MIPs were obtained to evaluate the vascular anatomy. CONTRAST:  100 mL Isovue 370 intravenous COMPARISON:  Radiographs 05/08/2016 FINDINGS: Cardiovascular: Satisfactory opacification of the pulmonary arteries to the segmental level. No evidence of pulmonary embolism. Normal heart size. No pericardial effusion. Mediastinum/Nodes: Numerous  pathologically enlarged nodes in the mediastinum, hila, axillary regions, supraclavicular regions and upper abdomen. Many of the nodes measure well over 2 cm in short axis. Esophagus is unremarkable.  Small hiatal hernia. Lungs/Pleura: Lungs are clear. No pleural effusion or pneumothorax. Upper Abdomen: Low-attenuation liver lesions, incompletely imaged but most likely benign cysts. By report these are larger than on 03/30/2002. Numerous enlarged nodes in the splenic hilum and peripancreatic region. Spleen is generous in size but incompletely imaged. No focal lesions are evident in the included portions of the spleen. Musculoskeletal: No significant skeletal lesion. Moderate chronic appearing compression of T4. Review of the MIP images confirms the above findings. IMPRESSION: 1. Negative for acute pulmonary embolism 2. Numerous pathologic nodes in the mediastinum, hila, axillae, supraclavicular  regions and upper abdomen. The adenopathy is suspicious for malignancy such as lymphoma or CLL. If tissue diagnosis is desired, many of the supraclavicular and axillary nodes should be palpable and accessible to excisional biopsy. 3. Low-attenuation liver lesions, incompletely imaged and not characterized but more likely benign. 4. Small hiatal hernia. 5. These results will be called to the ordering clinician or representative by the Radiologist Assistant, and communication documented in the PACS or zVision Dashboard. Electronically Signed   By: Andreas Newport M.D.   On: 05/08/2016 22:18   Ct Cervical Spine Wo Contrast  Result Date: 05/08/2016 CLINICAL DATA:  Unwitnessed fall. EXAM: CT HEAD WITHOUT CONTRAST CT CERVICAL SPINE WITHOUT CONTRAST TECHNIQUE: Multidetector CT imaging of the head and cervical spine was performed following the standard protocol without intravenous contrast. Multiplanar CT image reconstructions of the cervical spine were also generated. COMPARISON:  None. FINDINGS: CT HEAD FINDINGS Brain: No evidence of parenchymal hemorrhage or extra-axial fluid collection. No mass lesion, mass effect, or midline shift. No CT evidence of acute infarction. Nonspecific prominent subcortical and periventricular white matter hypodensity, most in keeping with chronic small vessel ischemic change. Generalized cerebral volume loss. No ventriculomegaly. Vascular: No hyperdense vessel or unexpected calcification. Skull: No evidence of calvarial fracture. Sinuses/Orbits: Mucoperiosteal thickening throughout the bilateral ethmoidal air cells and visualized bilateral maxillary sinuses with hyperostosis of the visualized right greater than left maxillary sinus walls. No fluid levels in the visualized paranasal sinuses. Other: Mild left anterior frontal scalp contusion. The mastoid air cells are unopacified. CT CERVICAL SPINE FINDINGS Alignment: Straightening of the cervical spine. Minimal 2 mm anterolisthesis at  C2-3. Otherwise no subluxation. Dens is well positioned between the lateral masses of C1. Skull base and vertebrae: No acute fracture. No primary bone lesion or focal pathologic process. Soft tissues and spinal canal: No prevertebral fluid or swelling. No visible canal hematoma. Disc levels: Severe degenerative disc disease throughout the cervical spine. Moderate bilateral facet arthropathy, most prominent in the left upper cervical spine. Moderate degenerate foraminal stenosis on the right at C3-4. Mild degenerate foraminal stenosis on the left at C4-5. Moderate bilateral degenerative foraminal stenosis at C5-6. Moderate degenerate foraminal stenosis on the right at C6-7. Upper chest: Negative. Other: Visualized mastoid air cells appear clear. No discrete thyroid nodules. Symmetric bilateral cervical lymphadenopathy involving levels 2 through 5 bilaterally, measuring up to 1.1 cm on the right at level to (series 8/ image 34) and 1.1 cm on the left at level 3 (series 8/ image 64). IMPRESSION: 1. Mild left anterior frontal scalp contusion. 2. No evidence of acute intracranial abnormality. No evidence of calvarial fracture. 3. Chronic paranasal sinusitis. 4. No cervical spine fracture. 5. Severe degenerative changes in the cervical spine as described. Minimal 2 mm  anterolisthesis at C2-3 is probably degenerative. 6. Symmetric bilateral neck lymphadenopathy, nonspecific, cannot exclude a lymphoproliferative disorder. Management options include a follow-up neck CT in 3 months versus tissue sampling, as clinically warranted. Electronically Signed   By: Ilona Sorrel M.D.   On: 05/08/2016 19:40    Scheduled Meds: . doxycycline  100 mg Oral Q12H  . enoxaparin (LOVENOX) injection  40 mg Subcutaneous Q24H  . ipratropium-albuterol  3 mL Nebulization Q6H  . irbesartan  300 mg Oral Daily  . isosorbide mononitrate  30 mg Oral Daily  . methylPREDNISolone (SOLU-MEDROL) injection  60 mg Intravenous Q6H  . metoprolol  succinate  100 mg Oral Daily  . montelukast  10 mg Oral QPM  . oseltamivir  30 mg Oral BID  . potassium chloride  10 mEq Oral Daily  . pravastatin  10 mg Oral q1800  . QUEtiapine  25 mg Oral BID      Time spent: 25 min  [facility] Pager [phone]. If 7PM-7AM, please contact night-coverage at [email or web address], Office  [phone]  password [email or web address] 05/10/2016, 10:41 AM  LOS: 2 days

## 2016-05-11 ENCOUNTER — Other Ambulatory Visit: Payer: Self-pay | Admitting: Hematology and Oncology

## 2016-05-11 DIAGNOSIS — R5381 Other malaise: Secondary | ICD-10-CM

## 2016-05-11 DIAGNOSIS — C9110 Chronic lymphocytic leukemia of B-cell type not having achieved remission: Secondary | ICD-10-CM

## 2016-05-11 LAB — BASIC METABOLIC PANEL
Anion gap: 9 (ref 5–15)
BUN: 22 mg/dL — ABNORMAL HIGH (ref 6–20)
CALCIUM: 8.3 mg/dL — AB (ref 8.9–10.3)
CHLORIDE: 98 mmol/L — AB (ref 101–111)
CO2: 25 mmol/L (ref 22–32)
CREATININE: 0.93 mg/dL (ref 0.44–1.00)
GFR calc non Af Amer: 54 mL/min — ABNORMAL LOW (ref >60)
Glucose, Bld: 178 mg/dL — ABNORMAL HIGH (ref 65–99)
Potassium: 3.5 mmol/L (ref 3.5–5.1)
Sodium: 132 mmol/L — ABNORMAL LOW (ref 135–145)

## 2016-05-11 MED ORDER — PREDNISONE 10 MG PO TABS: ORAL_TABLET | ORAL | 0 refills | Status: DC

## 2016-05-11 MED ORDER — OSELTAMIVIR PHOSPHATE 30 MG PO CAPS: 30.0000 mg | ORAL_CAPSULE | Freq: Two times a day (BID) | ORAL | 0 refills | Status: DC

## 2016-05-11 MED ORDER — DOXYCYCLINE HYCLATE 100 MG PO TABS: 100.0000 mg | ORAL_TABLET | Freq: Two times a day (BID) | ORAL | 0 refills | Status: DC

## 2016-05-11 NOTE — Care Management Note (Addendum)
Case Management Note  Patient Details  Name: Vassie Kugel MRN: [id number] Date of Birth: [birth year]  Subjective/Objective:        Syncope, Influenza A            Action/Plan: Discharge Planning: AVS reviewed:  NCM spoke to pt and dtr, [name] at bedside. Pt lives in an [place] apt. Dtr states she will have pt stay with her until she has healed from the flu. Pt has RW and cane at home. Provided dtr with private duty care list. States she will check on having an aide come in while she is working. Dtr states pt does not have a LTC policy. Dtr will arrange appt with PCP list on dc instructions.   NCM spoke to dtr and offered choice/list provided. Home Health arranged with [facility] RN. Contacted [facility] Liaison with new referral. Dtr's address is [address] [place] [address], # [phone].   Expected Discharge Date:  05/11/16               Expected Discharge Plan:  Home/Self Care  In-House Referral:  NA  Discharge planning Services  CM Consult  Post Acute Care Choice:  NA Choice offered to:  NA  DME Arranged:  N/A DME Agency:  NA  HH Arranged:  NA HH Agency:  NA  Status of Service:  Completed, signed off  If discussed at [place] of Stay Meetings, dates discussed:    Additional Comments:  Erenest Rasher, RN 05/11/2016, 3:31 PM

## 2016-05-11 NOTE — Progress Notes (Signed)
Physical Therapy Treatment Patient Details Name: Carla Conway MRN: [id number] DOB: [birth year] Today's Date: 05/11/2016    History of Present Illness 83 y.o. female with history of  HTN, CAD, HL who presented to ED after being found on the floor at her apartment ([place]) by her family.  Brought to ED where pt has cough and confusion, flu A swab is positive.     PT Comments    Assisted OOB to amb to bathroom then in hallway.   Pt declined the use of a walker but then need 50% use of rails and furniture to steady self.  Pt also intermittent confused to current situation/environment.  Pt plans to D/C back to her [place] apartment.    Follow Up Recommendations  Supervision/Assistance - 24 hour     Equipment Recommendations  None recommended by PT    Recommendations for Other Services       Precautions / Restrictions Precautions Precautions: Fall Precaution Comments: + flu Restrictions Weight Bearing Restrictions: No    Mobility  Bed Mobility Overal bed mobility: Needs Assistance Bed Mobility: Supine to Sit     Supine to sit: Supervision     General bed mobility comments: performed well  Transfers Overall transfer level: Needs assistance Equipment used: Rolling walker (2 wheeled) Transfers: Sit to/from Stand Sit to Stand: Supervision;Min guard         General transfer comment: guard needed for safety. Transfers performed from bed and toilet.   Ambulation/Gait Ambulation/Gait assistance: Supervision;Min guard Ambulation Distance (Feet): 75 Feet Assistive device: None (pt declined the use of a walker but then needed to staedy self with rails in hallway 50% of time) Gait Pattern/deviations: Step-through pattern;Decreased step length - right;Decreased step length - left;Trunk flexed     General Gait Details: cues and occasional physical assist needed for safety, repeated verbal cues needed as well on environmental safety   Stairs            Wheelchair  Mobility    Modified Rankin (Stroke Patients Only)       Balance                                    Cognition Arousal/Alertness: Awake/alert (intermittent confused to current situation) Behavior During Therapy: [facility] for tasks assessed/performed                        Exercises      General Comments        Pertinent Vitals/Pain Pain Assessment: No/denies pain    Home Living                      Prior Function            PT Goals (current goals can now be found in the care plan section) Progress towards PT goals: Progressing toward goals    Frequency    Min 3X/week      PT Plan Current plan remains appropriate    Co-evaluation             End of Session Equipment Utilized During Treatment: Gait belt Activity Tolerance: Patient tolerated treatment well Patient left: in chair;with call bell/phone within reach;with chair alarm set     Time: 0932-6712 PT Time Calculation (min) (ACUTE ONLY): 12 min  Charges:  $Gait Training: 8-22 mins  G Codes:      Rica Koyanagi  PTA [facility]  Acute  Rehab Pager      [phone]

## 2016-05-11 NOTE — Progress Notes (Addendum)
Discharge instructions reviewed with patients daughter [name] via the phone. Questions concerns denied. Pt is A&Ox4 (forgetful) and ambulatory with walker. No cahnge in am assessment.

## 2016-05-11 NOTE — Care Management Important Message (Signed)
Important Message  Patient Details  Name: Carla Conway MRN: [id number] Date of Birth: 08-25-1926   Medicare Important Message Given:  Yes    Erenest Rasher, RN 05/11/2016, 3:07 PM

## 2016-05-11 NOTE — Plan of Care (Signed)
Problem: Safety: Goal: Ability to remain free from injury will improve Outcome: Completed/Met Date Met: 05/11/16 Discussed safety prevention with patient. Bed alarm set. Also discussed continued safety measures for with patient and her daughter   

## 2016-05-11 NOTE — Progress Notes (Signed)
SATURATION QUALIFICATIONS:   Patient Saturations on Room Air at Rest = 96 %  Patient Saturations on Room Air while Ambulating = 94 %  Patient Saturations on  Liters of oxygen while Ambulating -not needed  Please briefly explain why patient needs home oxygen: patient had some wheezing and sob with acidity, saturation sustained 94% while ambulating.

## 2016-05-11 NOTE — Discharge Summary (Signed)
Physician Discharge Summary  Chaneka Trefz Pra VOH:[id number] DOB: 1927/01/29 DOA: 05/08/2016  PCP: Vena Austria, MD  Admit date: 05/08/2016 Discharge date: 05/11/2016  Time spent: 25* minutes  Recommendations for Outpatient Follow-up:  1. Follow up PCP in 2 weeks 2. Follow up Oncology in 2 weeks   Discharge Diagnoses:  Active Problems:   Syncope   Influenza A   Lymphadenopathy of head and neck region   Lymphadenopathy, thoracic   COPD (chronic obstructive pulmonary disease) (HCC)   Debility   Syncopal episodes   Acute on chronic diastolic heart failure [facility])   Discharge Condition: Stable  Diet recommendation: Low salt diet  Filed Weights   05/09/16 0110 05/11/16 0422  Weight: 70.2 kg (154 lb 12.2 oz) 71.6 kg (157 lb 13.6 oz)    History of present illness:  83 y.o.femalewith history of HTN, CAD, HL who presented to ED today after being found on the floor at her apartment ([place]) by her family. Brought to ED where pt has cough and confusion, flu A swab is + and WBC 38K. Pt had CT of neck/ head / chest and these studies are showing lots of lymphadenopathy in the chest, axillae and neck. Possible CLL vs lymphoma. CXR was negative. No fever.    Hospital Course:  1. Influenza A with upper respiratory infection/acute bronchitis-continue Tamiflu for three more days,  doxycycline 100 mg twice a day For 5 more days, patient was started on Solu-Medrol 60 mg IV every 6 hours, will discharge on prednisone taper, Combivent inhaler every 6 hours when necessary. 2. COPD- as above  3. Fall- patient came to hospital with fall, questionable syncope. Echo shows grade 2 diastolic dysfunction. PT CONSULTATION WAS OBTAINED, NO PT FOLLOW-UP RECOMMENDED.  4. Acute on chronic diastolic CHF- echocardiogram shows grade 2 diastolic dysfunction, patient was given 1 dose of Lasix 20 mg IV. Continue Maxzide at home 5. Hypokalemia- potassium was replaced yesterday, for the potassium is  3.5 6. Diffuse lymphadenopathy on CT chest/? CLL-CT chest showed numerous pathologic nodes in the mediastinum, hilum, axillae, supraclavicular regions and upper abdomen. Adenopathy suspicious for malignancy such as lymphoma or CLL. Oncology was consulted, no need for biopsy during this hospitalization. Patient to follow-up oncology as outpatient. Flow cytometry has been ordered 7. Hypertension-blood pressure stable, continue metoprolol, Avapro, Maxzide   Procedures:  None   Consultations:  Oncology  Discharge Exam: Vitals:   05/11/16 0422 05/11/16 0934  BP: (!) 170/65 (!) 148/65  Pulse: 81 87  Resp: 20   Temp: 97.5 F (36.4 C)     General: Appears in no acute distress Cardiovascular: RRR, s1s2 Respiratory: Scattered wheezing bilaterally  Discharge Instructions   Discharge Instructions    Diet - low sodium heart healthy    Complete by:  As directed    Increase activity slowly    Complete by:  As directed      Current Discharge Medication List    START taking these medications   Details  doxycycline (VIBRA-TABS) 100 MG tablet Take 1 tablet (100 mg total) by mouth every 12 (twelve) hours. Qty: 10 tablet, Refills: 0    oseltamivir (TAMIFLU) 30 MG capsule Take 1 capsule (30 mg total) by mouth 2 (two) times daily. Qty: 6 capsule, Refills: 0    predniSONE (DELTASONE) 10 MG tablet Prednisone 40 mg po daily x 1 day then Prednisone 30 mg po daily x 1 day then Prednisone 20 mg po daily x 1 day then Prednisone 10 mg daily x 1 day then  stop... Qty: 10 tablet, Refills: 0      CONTINUE these medications which have NOT CHANGED   Details  COMBIVENT RESPIMAT 20-100 MCG/ACT AERS respimat Take 2 puffs by mouth every 6 (six) hours as needed.     irbesartan (AVAPRO) 300 MG tablet Take 300 mg by mouth daily.     isosorbide mononitrate (IMDUR) 30 MG 24 hr tablet Take 30 mg by mouth daily.     lovastatin (MEVACOR) 40 MG tablet Take 40 mg by mouth every evening.     metoprolol  succinate (TOPROL-XL) 100 MG 24 hr tablet Take 100 mg by mouth daily.     montelukast (SINGULAIR) 10 MG tablet Take 10 mg by mouth every evening.     potassium chloride (K-DUR) 10 MEQ tablet Take 10 mEq by mouth daily.    QUEtiapine (SEROQUEL) 25 MG tablet Take 25 mg by mouth 2 (two) times daily.    triamterene-hydrochlorothiazide (MAXZIDE-25) 37.5-25 MG tablet Take 1 tablet by mouth daily.        No Known Allergies Follow-up Information    KOIRALA,DIBAS, MD. Schedule an appointment as soon as possible for a visit in 2 week(s).   Specialty:  Family Medicine Contact information: [address] [place] [address] [phone]        Heath Lark, MD. Schedule an appointment as soon as possible for a visit in 2 week(s).   Specialty:  Hematology and Oncology Contact information: [place] [address] [phone]            The results of significant diagnostics from this hospitalization (including imaging, microbiology, ancillary and laboratory) are listed below for reference.    Significant Diagnostic Studies: Dg Chest 2 View  Result Date: 05/08/2016 CLINICAL DATA:  Patient found on floor. Nonproductive cough and sore throat, subacute onset. Generalized weakness, acute onset. Initial encounter. EXAM: CHEST  2 VIEW COMPARISON:  Chest radiograph performed 07/05/2013 FINDINGS: The lungs are well-aerated. Minimal bibasilar atelectasis is noted. There is no evidence of pleural effusion or pneumothorax. The heart is borderline normal in size. Scattered calcification is seen along the thoracic and proximal abdominal aorta. No acute osseous abnormalities are seen. Mild degenerative change is noted at the right glenohumeral joint. IMPRESSION: 1. Minimal bibasilar atelectasis noted. Lungs otherwise grossly clear. 2. Scattered aortic atherosclerosis. Electronically Signed   By: Garald Balding M.D.   On: 05/08/2016 19:34   Dg Pelvis 1-2  Views  Result Date: 05/08/2016 CLINICAL DATA:  Status post unwitnessed fall, with concern for pelvic injury. Initial encounter. EXAM: PELVIS - 1-2 VIEW COMPARISON:  None. FINDINGS: There is no evidence of fracture or dislocation. Both femoral heads are seated normally within their respective acetabula. Mild degenerative change is noted at the lower lumbar spine. The sacroiliac joints are grossly unremarkable. The visualized bowel gas pattern is grossly unremarkable in appearance. IMPRESSION: No evidence of fracture or dislocation. Electronically Signed   By: Garald Balding M.D.   On: 05/08/2016 19:35   Dg Elbow Complete Left  Result Date: 05/08/2016 CLINICAL DATA:  Status post unwitnessed fall, with left elbow laceration and pain. Initial encounter. EXAM: LEFT ELBOW - COMPLETE 3+ VIEW COMPARISON:  None. FINDINGS: There is no evidence of fracture or dislocation. The visualized joint spaces are preserved. No significant joint effusion is identified. The known soft tissue laceration is not well characterized on radiograph. IMPRESSION: No evidence of fracture or dislocation. Electronically Signed   By: Garald Balding M.D.   On: 05/08/2016  19:35   Ct Head Wo Contrast  Result Date: 05/08/2016 CLINICAL DATA:  Unwitnessed fall. EXAM: CT HEAD WITHOUT CONTRAST CT CERVICAL SPINE WITHOUT CONTRAST TECHNIQUE: Multidetector CT imaging of the head and cervical spine was performed following the standard protocol without intravenous contrast. Multiplanar CT image reconstructions of the cervical spine were also generated. COMPARISON:  None. FINDINGS: CT HEAD FINDINGS Brain: No evidence of parenchymal hemorrhage or extra-axial fluid collection. No mass lesion, mass effect, or midline shift. No CT evidence of acute infarction. Nonspecific prominent subcortical and periventricular white matter hypodensity, most in keeping with chronic small vessel ischemic change. Generalized cerebral volume loss. No ventriculomegaly. Vascular: No  hyperdense vessel or unexpected calcification. Skull: No evidence of calvarial fracture. Sinuses/Orbits: Mucoperiosteal thickening throughout the bilateral ethmoidal air cells and visualized bilateral maxillary sinuses with hyperostosis of the visualized right greater than left maxillary sinus walls. No fluid levels in the visualized paranasal sinuses. Other: Mild left anterior frontal scalp contusion. The mastoid air cells are unopacified. CT CERVICAL SPINE FINDINGS Alignment: Straightening of the cervical spine. Minimal 2 mm anterolisthesis at C2-3. Otherwise no subluxation. Dens is well positioned between the lateral masses of C1. Skull base and vertebrae: No acute fracture. No primary bone lesion or focal pathologic process. Soft tissues and spinal canal: No prevertebral fluid or swelling. No visible canal hematoma. Disc levels: Severe degenerative disc disease throughout the cervical spine. Moderate bilateral facet arthropathy, most prominent in the left upper cervical spine. Moderate degenerate foraminal stenosis on the right at C3-4. Mild degenerate foraminal stenosis on the left at C4-5. Moderate bilateral degenerative foraminal stenosis at C5-6. Moderate degenerate foraminal stenosis on the right at C6-7. Upper chest: Negative. Other: Visualized mastoid air cells appear clear. No discrete thyroid nodules. Symmetric bilateral cervical lymphadenopathy involving levels 2 through 5 bilaterally, measuring up to 1.1 cm on the right at level to (series 8/ image 34) and 1.1 cm on the left at level 3 (series 8/ image 64). IMPRESSION: 1. Mild left anterior frontal scalp contusion. 2. No evidence of acute intracranial abnormality. No evidence of calvarial fracture. 3. Chronic paranasal sinusitis. 4. No cervical spine fracture. 5. Severe degenerative changes in the cervical spine as described. Minimal 2 mm anterolisthesis at C2-3 is probably degenerative. 6. Symmetric bilateral neck lymphadenopathy, nonspecific, cannot  exclude a lymphoproliferative disorder. Management options include a follow-up neck CT in 3 months versus tissue sampling, as clinically warranted. Electronically Signed   By: Ilona Sorrel M.D.   On: 05/08/2016 19:40   Ct Angio Chest Pe W And/or Wo Contrast  Result Date: 05/08/2016 CLINICAL DATA:  Syncope and hypoxia.  Unwitnessed fall. EXAM: CT ANGIOGRAPHY CHEST WITH CONTRAST TECHNIQUE: Multidetector CT imaging of the chest was performed using the standard protocol during bolus administration of intravenous contrast. Multiplanar CT image reconstructions and MIPs were obtained to evaluate the vascular anatomy. CONTRAST:  100 mL Isovue 370 intravenous COMPARISON:  Radiographs 05/08/2016 FINDINGS: Cardiovascular: Satisfactory opacification of the pulmonary arteries to the segmental level. No evidence of pulmonary embolism. Normal heart size. No pericardial effusion. Mediastinum/Nodes: Numerous pathologically enlarged nodes in the mediastinum, hila, axillary regions, supraclavicular regions and upper abdomen. Many of the nodes measure well over 2 cm in short axis. Esophagus is unremarkable.  Small hiatal hernia. Lungs/Pleura: Lungs are clear. No pleural effusion or pneumothorax. Upper Abdomen: Low-attenuation liver lesions, incompletely imaged but most likely benign cysts. By report these are larger than on 03/30/2002. Numerous enlarged nodes in the splenic hilum and peripancreatic region. Spleen is generous in  size but incompletely imaged. No focal lesions are evident in the included portions of the spleen. Musculoskeletal: No significant skeletal lesion. Moderate chronic appearing compression of T4. Review of the MIP images confirms the above findings. IMPRESSION: 1. Negative for acute pulmonary embolism 2. Numerous pathologic nodes in the mediastinum, hila, axillae, supraclavicular regions and upper abdomen. The adenopathy is suspicious for malignancy such as lymphoma or CLL. If tissue diagnosis is desired, many  of the supraclavicular and axillary nodes should be palpable and accessible to excisional biopsy. 3. Low-attenuation liver lesions, incompletely imaged and not characterized but more likely benign. 4. Small hiatal hernia. 5. These results will be called to the ordering clinician or representative by the Radiologist Assistant, and communication documented in the PACS or zVision Dashboard. Electronically Signed   By: Andreas Newport M.D.   On: 05/08/2016 22:18   Ct Cervical Spine Wo Contrast  Result Date: 05/08/2016 CLINICAL DATA:  Unwitnessed fall. EXAM: CT HEAD WITHOUT CONTRAST CT CERVICAL SPINE WITHOUT CONTRAST TECHNIQUE: Multidetector CT imaging of the head and cervical spine was performed following the standard protocol without intravenous contrast. Multiplanar CT image reconstructions of the cervical spine were also generated. COMPARISON:  None. FINDINGS: CT HEAD FINDINGS Brain: No evidence of parenchymal hemorrhage or extra-axial fluid collection. No mass lesion, mass effect, or midline shift. No CT evidence of acute infarction. Nonspecific prominent subcortical and periventricular white matter hypodensity, most in keeping with chronic small vessel ischemic change. Generalized cerebral volume loss. No ventriculomegaly. Vascular: No hyperdense vessel or unexpected calcification. Skull: No evidence of calvarial fracture. Sinuses/Orbits: Mucoperiosteal thickening throughout the bilateral ethmoidal air cells and visualized bilateral maxillary sinuses with hyperostosis of the visualized right greater than left maxillary sinus walls. No fluid levels in the visualized paranasal sinuses. Other: Mild left anterior frontal scalp contusion. The mastoid air cells are unopacified. CT CERVICAL SPINE FINDINGS Alignment: Straightening of the cervical spine. Minimal 2 mm anterolisthesis at C2-3. Otherwise no subluxation. Dens is well positioned between the lateral masses of C1. Skull base and vertebrae: No acute fracture.  No primary bone lesion or focal pathologic process. Soft tissues and spinal canal: No prevertebral fluid or swelling. No visible canal hematoma. Disc levels: Severe degenerative disc disease throughout the cervical spine. Moderate bilateral facet arthropathy, most prominent in the left upper cervical spine. Moderate degenerate foraminal stenosis on the right at C3-4. Mild degenerate foraminal stenosis on the left at C4-5. Moderate bilateral degenerative foraminal stenosis at C5-6. Moderate degenerate foraminal stenosis on the right at C6-7. Upper chest: Negative. Other: Visualized mastoid air cells appear clear. No discrete thyroid nodules. Symmetric bilateral cervical lymphadenopathy involving levels 2 through 5 bilaterally, measuring up to 1.1 cm on the right at level to (series 8/ image 34) and 1.1 cm on the left at level 3 (series 8/ image 64). IMPRESSION: 1. Mild left anterior frontal scalp contusion. 2. No evidence of acute intracranial abnormality. No evidence of calvarial fracture. 3. Chronic paranasal sinusitis. 4. No cervical spine fracture. 5. Severe degenerative changes in the cervical spine as described. Minimal 2 mm anterolisthesis at C2-3 is probably degenerative. 6. Symmetric bilateral neck lymphadenopathy, nonspecific, cannot exclude a lymphoproliferative disorder. Management options include a follow-up neck CT in 3 months versus tissue sampling, as clinically warranted. Electronically Signed   By: Ilona Sorrel M.D.   On: 05/08/2016 19:40    Microbiology: Recent Results (from the past 240 hour(s))  MRSA PCR Screening     Status: None   Collection Time: 05/09/16  1:24 AM  Result Value Ref Range Status   MRSA by PCR NEGATIVE NEGATIVE Final    Comment:        The GeneXpert MRSA Assay (FDA approved for NASAL specimens only), is one component of a comprehensive MRSA colonization surveillance program. It is not intended to diagnose MRSA infection nor to guide or monitor treatment  for MRSA infections.      Labs: Basic Metabolic Panel:  Recent Labs Lab 05/08/16 2004 05/09/16 0550 05/10/16 1010 05/11/16 0517  NA 127* 130* 132* 132*  K 3.3* 3.0* 3.3* 3.5  CL 91* 95* 97* 98*  CO2 24 25 26 25   GLUCOSE 119* 97 108* 178*  BUN 15 13 10  22*  CREATININE 0.99 0.99 1.03* 0.93  CALCIUM 8.7* 8.4* 8.3* 8.3*   Liver Function Tests: No results for input(s): AST, ALT, ALKPHOS, BILITOT, PROT, ALBUMIN in the last 168 hours. No results for input(s): LIPASE, AMYLASE in the last 168 hours. No results for input(s): AMMONIA in the last 168 hours. CBC:  Recent Labs Lab 05/08/16 2004 05/09/16 0550  WBC 38.3* 35.9*  NEUTROABS 5.4  --   HGB 12.1 10.4*  HCT 34.9* 29.8*  MCV 86.0 88.4  PLT 210 181   Cardiac Enzymes:  Recent Labs Lab 05/08/16 2004  CKTOTAL 349*   BNP:     Signed:  Oswald Hillock MD.  [facility] 05/11/2016, 11:58 AM

## 2016-05-11 NOTE — Progress Notes (Signed)
Carla Conway   DOB:05-05-1926   VO#:[id number]    Subjective: She is feeling better. SOB has improved. Probably going home soon  Objective:  Vitals:   05/10/16 2048 05/11/16 0422  BP: (!) 136/57 (!) 170/65  Pulse: 96 81  Resp: 16 20  Temp: 98.1 F (36.7 C) 97.5 F (36.4 C)     Intake/Output Summary (Last 24 hours) at 05/11/16 0936 Last data filed at 05/11/16 0900  Gross per 24 hour  Intake              700 ml  Output              250 ml  Net              450 ml    GENERAL:alert, no distress and comfortable SKIN: skin color, texture, turgor are normal, no rashes or significant lesions. Noted bruises EYES: normal, Conjunctiva are pink and non-injected, sclera clear OROPHARYNX:no exudate, no erythema and lips, buccal mucosa, and tongue normal  NECK: supple, thyroid normal size, non-tender, without nodularity LYMPH:  Palpable lymphadenopathy in the neck NEURO: alert & oriented x 3 with fluent speech, no focal motor/sensory deficits   Labs:  Lab Results  Component Value Date   WBC 35.9 (H) 05/09/2016   HGB 10.4 (L) 05/09/2016   HCT 29.8 (L) 05/09/2016   MCV 88.4 05/09/2016   PLT 181 05/09/2016   NEUTROABS 5.4 05/08/2016    Lab Results  Component Value Date   NA 132 (L) 05/11/2016   K 3.5 05/11/2016   CL 98 (L) 05/11/2016   CO2 25 05/11/2016    Assessment & Plan:   Lymphocytosis, diffuse lymphadenopathy, suspicious for CLL Spoke with daughter, [name] She was followed by another oncologist but would like to transfer care to me Will obtain outside records Overall picture is compatible with CLL In comparison to her blood work dated 3 years ago, she had minimum increment of total white blood cell count. Overall, it is compatible with stage I disease She likely can be observed I do not feel strongly she needs excisional lymph node biopsy for now  Influenza infection Continue treatment and supportive care  Recent falls, weakness Likely related to influenza and  not from lymphoproliferative disorder  Recent weight loss Recommend dietitian consult  Discharge planning She is profoundly weak Hopefully soon I will set up outpatient follow-up at the end of the month Will sign off Heath Lark, MD 05/11/2016  9:36 AM

## 2016-05-12 ENCOUNTER — Telehealth: Payer: Self-pay | Admitting: Hematology and Oncology

## 2016-05-12 NOTE — Telephone Encounter (Signed)
Confirmed appt on 2/26 w/Dr. Alvy Bimler with the patient's daughter.

## 2016-05-26 ENCOUNTER — Ambulatory Visit: Payer: Medicare Other

## 2016-06-02 ENCOUNTER — Telehealth: Payer: Self-pay | Admitting: Hematology and Oncology

## 2016-06-02 ENCOUNTER — Other Ambulatory Visit: Payer: Medicare Other

## 2016-06-02 ENCOUNTER — Inpatient Hospital Stay: Payer: Medicare Other | Admitting: Hematology and Oncology

## 2016-06-02 NOTE — Telephone Encounter (Signed)
Needs to cancel appointment today and will call back to reschedule

## 2016-09-22 ENCOUNTER — Other Ambulatory Visit (HOSPITAL_COMMUNITY)
Admission: RE | Admit: 2016-09-22 | Discharge: 2016-09-22 | Disposition: A | Discharge status: Home / Self Care | Payer: Medicare Other | Source: Ambulatory Visit | Attending: Hematology and Oncology | Admitting: Hematology and Oncology

## 2016-09-22 ENCOUNTER — Other Ambulatory Visit (HOSPITAL_BASED_OUTPATIENT_CLINIC_OR_DEPARTMENT_OTHER): Payer: Medicare Other

## 2016-09-22 ENCOUNTER — Telehealth: Payer: Self-pay | Admitting: Hematology and Oncology

## 2016-09-22 ENCOUNTER — Ambulatory Visit (HOSPITAL_BASED_OUTPATIENT_CLINIC_OR_DEPARTMENT_OTHER): Payer: Medicare Other | Admitting: Hematology and Oncology

## 2016-09-22 DIAGNOSIS — I10 Essential (primary) hypertension: Secondary | ICD-10-CM

## 2016-09-22 DIAGNOSIS — D709 Neutropenia, unspecified: Secondary | ICD-10-CM

## 2016-09-22 DIAGNOSIS — C9110 Chronic lymphocytic leukemia of B-cell type not having achieved remission: Secondary | ICD-10-CM

## 2016-09-22 DIAGNOSIS — C9190 Lymphoid leukemia, unspecified not having achieved remission: Secondary | ICD-10-CM | POA: Insufficient documentation

## 2016-09-22 DIAGNOSIS — D708 Other neutropenia: Secondary | ICD-10-CM

## 2016-09-22 LAB — COMPREHENSIVE METABOLIC PANEL
ALT: 13 U/L (ref 0–55)
AST: 18 U/L (ref 5–34)
Albumin: 4.1 g/dL (ref 3.5–5.0)
Alkaline Phosphatase: 94 U/L (ref 40–150)
Anion Gap: 10 mEq/L (ref 3–11)
BILIRUBIN TOTAL: 0.57 mg/dL (ref 0.20–1.20)
BUN: 21.2 mg/dL (ref 7.0–26.0)
CHLORIDE: 97 meq/L — AB (ref 98–109)
CO2: 27 meq/L (ref 22–29)
Calcium: 9.8 mg/dL (ref 8.4–10.4)
Creatinine: 1.1 mg/dL (ref 0.6–1.1)
EGFR: 44 mL/min/{1.73_m2} — AB (ref >90)
Glucose: 102 mg/dl (ref 70–140)
Potassium: 4 mEq/L (ref 3.5–5.1)
SODIUM: 133 meq/L — AB (ref 136–145)
TOTAL PROTEIN: 6.8 g/dL (ref 6.4–8.3)

## 2016-09-22 LAB — CBC WITH DIFFERENTIAL/PLATELET
BASO%: 0.2 % (ref 0.0–2.0)
BASOS ABS: 0.1 10*3/uL (ref 0.0–0.1)
EOS ABS: 0.3 10*3/uL (ref 0.0–0.5)
EOS%: 0.4 % (ref 0.0–7.0)
HCT: 38.5 % (ref 34.8–46.6)
HEMOGLOBIN: 12.3 g/dL (ref 11.6–15.9)
LYMPH#: 84.6 10*3/uL — AB (ref 0.9–3.3)
LYMPH%: 97.2 % — ABNORMAL HIGH (ref 14.0–49.7)
MCH: 29.3 pg (ref 25.1–34.0)
MCHC: 32 g/dL (ref 31.5–36.0)
MCV: 91.6 fL (ref 79.5–101.0)
MONO#: 1 10*3/uL — ABNORMAL HIGH (ref 0.1–0.9)
MONO%: 1.2 % (ref 0.0–14.0)
NEUT#: 0.9 10*3/uL — ABNORMAL LOW (ref 1.5–6.5)
NEUT%: 1 % — ABNORMAL LOW (ref 38.4–76.8)
PLATELETS: 202 10*3/uL (ref 145–400)
RBC: 4.2 10*6/uL (ref 3.70–5.45)
RDW: 16.1 % — ABNORMAL HIGH (ref 11.2–14.5)
WBC: 87 10*3/uL (ref 3.9–10.3)
nRBC: 0 % (ref 0–0)

## 2016-09-22 LAB — LACTATE DEHYDROGENASE: LDH: 191 U/L (ref 125–245)

## 2016-09-22 LAB — TECHNOLOGIST REVIEW: Technologist Review: 7

## 2016-09-22 NOTE — Telephone Encounter (Signed)
Scheduled appt per 6/18 los - Gave patient AVS and calender per LOS.

## 2016-09-23 DIAGNOSIS — D709 Neutropenia, unspecified: Secondary | ICD-10-CM | POA: Insufficient documentation

## 2016-09-23 NOTE — Assessment & Plan Note (Signed)
She has neutropenia likely due to CLL but remain asymptomatic We discussed neutropenic precaution and I educated the patient and her daughter signs and symptoms to watch out for infection

## 2016-09-23 NOTE — Assessment & Plan Note (Signed)
she will continue current medical management. I recommend close follow-up with primary care doctor for medication adjustment.  

## 2016-09-23 NOTE — Assessment & Plan Note (Signed)
Flow cytometry confirmed CLL FISH analysis for prognostic markers are pending The patient is asymptomatic but I am concerned about new onset of neutropenia and significant lymphocyte doubling time in less than 6 months I plan to see her back with repeat blood work in July If repeat blood work confirm she is still neutropenic and had significant leukocytosis, we might have to initiate treatment soon

## 2016-09-23 NOTE — Progress Notes (Signed)
[facility] OFFICE PROGRESS NOTE  Patient Care Team: Koirala, Dibas, MD as PCP - General (Family Medicine)  SUMMARY OF ONCOLOGIC HISTORY:   CLL (chronic lymphocytic leukemia) ([place])   05/02/2013 Initial Diagnosis    She was found to have abnormal CBC from at least since 2015. According to the records from her primary care doctor that were faxed to the [facility], she had leukocytosis at least since January 2015. On 05/02/2013, total white blood cell count was 28.3, normal hemoglobin 13.4 and platelet count 239,000. She had predominantly Lymphocytosis. On 06/20/2013, a CBC show white count of 26.7, hemoglobin 12.9 and platelet count of 299,000 On admission, on 05/08/2016, white blood cell count was 38.4, hemoglobin 12.1 and platelet count of 210. Differential show predominantly lymphocytosis.      05/08/2016 Imaging    CT scan of chest 1. Negative for acute pulmonary embolism 2. Numerous pathologic nodes in the mediastinum, hila, axillae, supraclavicular regions and upper abdomen. The adenopathy is suspicious for malignancy such as lymphoma or CLL. If tissue diagnosis is desired, many of the supraclavicular and axillary nodes should be palpable and accessible to excisional biopsy. 3. Low-attenuation liver lesions, incompletely imaged and not characterized but more likely benign. 4. Small hiatal hernia.      09/22/2016 Pathology Results    Peripheral Blood Flow Cytometry - CHRONIC LYMPHOCYTIC LEUKEMIA. - SEE COMMENT. Diagnosis Comment: There is a population of monoclonal B-cells with expression of CD5 and CD23. The phenotype is consistent with chronic lymphocytic leukemia       INTERVAL HISTORY: Please see below for problem oriented charting. The patient returns for follow-up with her daughter. She was last seen in the hospital in February but was not able to reestablish care until recently The patient had relocated from [place] and currently lives in  [place] Since last time I saw her, she has been doing well She did notice new progressive lymphadenopathy throughout her neck The lymphadenopathy does not cause any pain. She denies anorexia, weight loss or night sweats  REVIEW OF SYSTEMS:   Constitutional: Denies fevers, chills or abnormal weight loss Eyes: Denies blurriness of vision Ears, nose, mouth, throat, and face: Denies mucositis or sore throat Respiratory: Denies cough, dyspnea or wheezes Cardiovascular: Denies palpitation, chest discomfort or lower extremity swelling Gastrointestinal:  Denies nausea, heartburn or change in bowel habits Skin: Denies abnormal skin rashes Neurological:Denies numbness, tingling or new weaknesses Behavioral/Psych: Mood is stable, no new changes  All other systems were reviewed with the patient and are negative.  I have reviewed the past medical history, past surgical history, social history and family history with the patient and they are unchanged from previous note.  ALLERGIES:  has No Known Allergies.  MEDICATIONS:  Current Outpatient Prescriptions  Medication Sig Dispense Refill  . COMBIVENT RESPIMAT 20-100 MCG/ACT AERS respimat Take 2 puffs by mouth every 6 (six) hours as needed.     . irbesartan (AVAPRO) 300 MG tablet Take 300 mg by mouth daily.     . isosorbide mononitrate (IMDUR) 30 MG 24 hr tablet Take 30 mg by mouth daily.     Marland Kitchen lovastatin (MEVACOR) 40 MG tablet Take 40 mg by mouth every evening.     . metoprolol succinate (TOPROL-XL) 100 MG 24 hr tablet Take 100 mg by mouth daily.     . montelukast (SINGULAIR) 10 MG tablet Take 10 mg by mouth every evening.     . potassium chloride (K-DUR) 10 MEQ tablet Take 10 mEq by mouth  daily.    . QUEtiapine (SEROQUEL) 25 MG tablet Take 25 mg by mouth 2 (two) times daily.    Marland Kitchen triamterene-hydrochlorothiazide (MAXZIDE-25) 37.5-25 MG tablet Take 1 tablet by mouth daily.      No current facility-administered medications for this visit.      PHYSICAL EXAMINATION: ECOG PERFORMANCE STATUS: 1 - Symptomatic but completely ambulatory  Vitals:   09/22/16 1020  BP: (!) 174/53  Pulse: 78  Resp: 18  Temp: 98 F (36.7 C)   Filed Weights   09/22/16 1020  Weight: 150 lb 3.2 oz (68.1 kg)    GENERAL:alert, no distress and comfortable SKIN: skin color, texture, turgor are normal, no rashes or significant lesions EYES: normal, Conjunctiva are pink and non-injected, sclera clear OROPHARYNX:no exudate, no erythema and lips, buccal mucosa, and tongue normal  NECK: supple, thyroid normal size, non-tender, without nodularity LYMPH: She has obvious lymphadenopathy on both sides of the neck and axilla on examination  LUNGS: clear to auscultation and percussion with normal breathing effort HEART: regular rate & rhythm and no murmurs and no lower extremity edema ABDOMEN:abdomen soft, non-tender and normal bowel sounds.  No palpable splenomegaly Musculoskeletal:no cyanosis of digits and no clubbing  NEURO: alert & oriented x 3 with fluent speech, no focal motor/sensory deficits  LABORATORY DATA:  I have reviewed the data as listed    Component Value Date/Time   NA 133 (L) 09/22/2016 1002   K 4.0 09/22/2016 1002   CL 98 (L) 05/11/2016 0517   CO2 27 09/22/2016 1002   GLUCOSE 102 09/22/2016 1002   BUN 21.2 09/22/2016 1002   CREATININE 1.1 09/22/2016 1002   CALCIUM 9.8 09/22/2016 1002   PROT 6.8 09/22/2016 1002   ALBUMIN 4.1 09/22/2016 1002   AST 18 09/22/2016 1002   ALT 13 09/22/2016 1002   ALKPHOS 94 09/22/2016 1002   BILITOT 0.57 09/22/2016 1002   GFRNONAA 54 (L) 05/11/2016 0517   GFRAA >60 05/11/2016 0517    No results found for: SPEP, UPEP  Lab Results  Component Value Date   WBC 87.0 (HH) 09/22/2016   NEUTROABS 0.9 (L) 09/22/2016   HGB 12.3 09/22/2016   HCT 38.5 09/22/2016   MCV 91.6 09/22/2016   PLT 202 09/22/2016      Chemistry      Component Value Date/Time   NA 133 (L) 09/22/2016 1002   K 4.0  09/22/2016 1002   CL 98 (L) 05/11/2016 0517   CO2 27 09/22/2016 1002   BUN 21.2 09/22/2016 1002   CREATININE 1.1 09/22/2016 1002      Component Value Date/Time   CALCIUM 9.8 09/22/2016 1002   ALKPHOS 94 09/22/2016 1002   AST 18 09/22/2016 1002   ALT 13 09/22/2016 1002   BILITOT 0.57 09/22/2016 1002     ASSESSMENT & PLAN:  CLL (chronic lymphocytic leukemia) (HCC) Flow cytometry confirmed CLL FISH analysis for prognostic markers are pending The patient is asymptomatic but I am concerned about new onset of neutropenia and significant lymphocyte doubling time in less than 6 months I plan to see her back with repeat blood work in July If repeat blood work confirm she is still neutropenic and had significant leukocytosis, we might have to initiate treatment soon  Neutropenia ([place]) She has neutropenia likely due to CLL but remain asymptomatic We discussed neutropenic precaution and I educated the patient and her daughter signs and symptoms to watch out for infection  HTN (hypertension) she will continue current medical management. I recommend close follow-up  with primary care doctor for medication adjustment.    No orders of the defined types were placed in this encounter.  All questions were answered. The patient knows to call the clinic with any problems, questions or concerns. No barriers to learning was detected. I spent 25 minutes counseling the patient face to face. The total time spent in the appointment was 30 minutes and more than 50% was on counseling and review of test results     Heath Lark, MD 09/23/2016 5:14 PM

## 2016-09-29 LAB — FISH, PERIPHERAL BLOOD

## 2016-09-29 LAB — FLOW CYTOMETRY

## 2016-10-13 ENCOUNTER — Other Ambulatory Visit: Payer: Self-pay | Admitting: Hematology and Oncology

## 2016-10-13 DIAGNOSIS — C9110 Chronic lymphocytic leukemia of B-cell type not having achieved remission: Secondary | ICD-10-CM

## 2016-10-15 ENCOUNTER — Other Ambulatory Visit (HOSPITAL_BASED_OUTPATIENT_CLINIC_OR_DEPARTMENT_OTHER): Payer: Medicare Other

## 2016-10-15 ENCOUNTER — Ambulatory Visit (HOSPITAL_BASED_OUTPATIENT_CLINIC_OR_DEPARTMENT_OTHER): Payer: Medicare Other | Admitting: Hematology and Oncology

## 2016-10-15 ENCOUNTER — Telehealth: Payer: Self-pay | Admitting: Hematology and Oncology

## 2016-10-15 DIAGNOSIS — C9110 Chronic lymphocytic leukemia of B-cell type not having achieved remission: Secondary | ICD-10-CM

## 2016-10-15 DIAGNOSIS — D709 Neutropenia, unspecified: Secondary | ICD-10-CM | POA: Diagnosis not present

## 2016-10-15 DIAGNOSIS — N183 Chronic kidney disease, stage 3 unspecified: Secondary | ICD-10-CM

## 2016-10-15 DIAGNOSIS — C8300 Small cell B-cell lymphoma, unspecified site: Secondary | ICD-10-CM | POA: Insufficient documentation

## 2016-10-15 DIAGNOSIS — D708 Other neutropenia: Secondary | ICD-10-CM

## 2016-10-15 LAB — CBC WITH DIFFERENTIAL/PLATELET
BASO%: 0.1 % (ref 0.0–2.0)
BASOS ABS: 0.1 10*3/uL (ref 0.0–0.1)
EOS ABS: 0.8 10*3/uL — AB (ref 0.0–0.5)
EOS%: 0.9 % (ref 0.0–7.0)
HCT: 38.4 % (ref 34.8–46.6)
HGB: 12.5 g/dL (ref 11.6–15.9)
LYMPH%: 97.1 % — AB (ref 14.0–49.7)
MCH: 29.6 pg (ref 25.1–34.0)
MCHC: 32.4 g/dL (ref 31.5–36.0)
MCV: 91.2 fL (ref 79.5–101.0)
MONO#: 0.8 10*3/uL (ref 0.1–0.9)
MONO%: 0.9 % (ref 0.0–14.0)
NEUT#: 0.9 10*3/uL — ABNORMAL LOW (ref 1.5–6.5)
NEUT%: 1 % — AB (ref 38.4–76.8)
NRBC: 0 % (ref 0–0)
PLATELETS: 215 10*3/uL (ref 145–400)
RBC: 4.21 10*6/uL (ref 3.70–5.45)
RDW: 15.8 % — ABNORMAL HIGH (ref 11.2–14.5)
WBC: 90.5 10*3/uL (ref 3.9–10.3)
lymph#: 87.8 10*3/uL — ABNORMAL HIGH (ref 0.9–3.3)

## 2016-10-15 LAB — TECHNOLOGIST REVIEW

## 2016-10-15 LAB — URIC ACID: Uric Acid, Serum: 5.9 mg/dl (ref 2.6–7.4)

## 2016-10-15 LAB — COMPREHENSIVE METABOLIC PANEL
ALK PHOS: 95 U/L (ref 40–150)
ALT: 11 U/L (ref 0–55)
ANION GAP: 11 meq/L (ref 3–11)
AST: 15 U/L (ref 5–34)
Albumin: 4.1 g/dL (ref 3.5–5.0)
BILIRUBIN TOTAL: 0.68 mg/dL (ref 0.20–1.20)
BUN: 22.8 mg/dL (ref 7.0–26.0)
CO2: 27 meq/L (ref 22–29)
Calcium: 9.9 mg/dL (ref 8.4–10.4)
Chloride: 98 mEq/L (ref 98–109)
Creatinine: 1.2 mg/dL — ABNORMAL HIGH (ref 0.6–1.1)
EGFR: 41 mL/min/{1.73_m2} — AB (ref >90)
Glucose: 97 mg/dl (ref 70–140)
POTASSIUM: 4.2 meq/L (ref 3.5–5.1)
Sodium: 135 mEq/L — ABNORMAL LOW (ref 136–145)
Total Protein: 6.7 g/dL (ref 6.4–8.3)

## 2016-10-15 NOTE — Telephone Encounter (Signed)
Scheduled appt per 7/11 - Gave patient AVS and calender per los. -

## 2016-10-15 NOTE — Progress Notes (Signed)
START ON PATHWAY REGIMEN - Lymphoma and CLL     A cycle is every 28 days:     Bendamustine      Rituximab      Rituximab   **Always confirm dose/schedule in your pharmacy ordering system**    Patient Characteristics: Small Lymphocytic Leukemia (SLL), First Line, Treatment Indicated, 17p del (-), Fit Patient, Age >= 78 Disease Type: Small Lymphocytic Leukemia (SLL) Disease Type: Not Applicable [place] Stage: II Line of therapy: First Line Treatment Indicated? Treatment Indicated 17p Deletion Status: Negative Fit or Frail Patient? Fit Patient Patient Age: Age >= 31 Intent of Therapy: Curative Intent, Discussed with Patient

## 2016-10-16 LAB — TISSUE HYBRIDIZATION TO NCBH

## 2016-10-17 ENCOUNTER — Encounter: Payer: Self-pay | Admitting: Hematology and Oncology

## 2016-10-17 DIAGNOSIS — N183 Chronic kidney disease, stage 3 unspecified: Secondary | ICD-10-CM | POA: Insufficient documentation

## 2016-10-17 NOTE — Progress Notes (Signed)
[facility] OFF[facility] PROGRESS NOTE  Patient Care Team: Koirala, Dibas, MD as PCP - General (Family Medicine)  SUMMARY OF ONCOLOGIC HISTORY:   CLL (chronic lymphocytic leukemia) ([place])   05/02/2013 Initial Diagnosis    She was found to have abnormal CBC from at least since 2015. According to the records from her primary care doctor that were faxed to the [facility], she had leukocytosis at least since January 2015. On 05/02/2013, total white blood cell count was 28.3, normal hemoglobin 13.4 and platelet count 239,000. She had predominantly Lymphocytosis. On 06/20/2013, a CBC show white count of 26.7, hemoglobin 12.9 and platelet count of 299,000 On admission, on 05/08/2016, white blood cell count was 38.4, hemoglobin 12.1 and platelet count of 210. Differential show predominantly lymphocytosis.      05/08/2016 Imaging    CT scan of chest 1. Negative for acute pulmonary embolism 2. Numerous pathologic nodes in the mediastinum, hila, axillae, supraclavicular regions and upper abdomen. The adenopathy is suspicious for malignancy such as lymphoma or CLL. If tissue diagnosis is desired, many of the supraclavicular and axillary nodes should be palpable and accessible to excisional biopsy. 3. Low-attenuation liver lesions, incompletely imaged and not characterized but more likely benign. 4. Small hiatal hernia.      09/22/2016 Pathology Results    Peripheral Blood Flow Cytometry - CHRONIC LYMPHOCYTIC LEUKEMIA. - SEE COMMENT. Diagnosis Comment: There is a population of monoclonal B-cells with expression of CD5 and CD23. The phenotype is consistent with chronic lymphocytic leukemia      09/22/2016 Pathology Results    FISH confirmed trisomy 12       INTERVAL HISTORY: Please see below for problem oriented charting. She returns with her daughter She has new lymphadenopathy since last time I saw her Denies recent infection She had frequent night sweats Appetite is stable and  there is no reported weight loss  REVIEW OF SYSTEMS:   Constitutional: Denies fevers, chills or abnormal weight loss Eyes: Denies blurriness of vision Ears, nose, mouth, throat, and face: Denies mucositis or sore throat Respiratory: Denies cough, dyspnea or wheezes Cardiovascular: Denies palpitation, chest discomfort or lower extremity swelling Gastrointestinal:  Denies nausea, heartburn or change in bowel habits Skin: Denies abnormal skin rashes Neurological:Denies numbness, tingling or new weaknesses Behavioral/Psych: Mood is stable, no new changes  All other systems were reviewed with the patient and are negative.  I have reviewed the past medical history, past surgical history, social history and family history with the patient and they are unchanged from previous note.  ALLERGIES:  has No Known Allergies.  MEDICATIONS:  Current Outpatient Prescriptions  Medication Sig Dispense Refill  . COMBIVENT RESPIMAT 20-100 MCG/ACT AERS respimat Take 2 puffs by mouth every 6 (six) hours as needed.     . irbesartan (AVAPRO) 300 MG tablet Take 300 mg by mouth daily.     . isosorbide mononitrate (IMDUR) 30 MG 24 hr tablet Take 30 mg by mouth daily.     Marland Kitchen lovastatin (MEVACOR) 40 MG tablet Take 40 mg by mouth every evening.     . metoprolol succinate (TOPROL-XL) 100 MG 24 hr tablet Take 100 mg by mouth daily.     . montelukast (SINGULAIR) 10 MG tablet Take 10 mg by mouth every evening.     . potassium chloride (K-DUR) 10 MEQ tablet Take 10 mEq by mouth daily.    . QUEtiapine (SEROQUEL) 25 MG tablet Take 25 mg by mouth 2 (two) times daily.    Marland Kitchen triamterene-hydrochlorothiazide (  MAXZIDE-25) 37.5-25 MG tablet Take 1 tablet by mouth daily.      No current facility-administered medications for this visit.     PHYSICAL EXAMINATION: ECOG PERFORMANCE STATUS: 1 - Symptomatic but completely ambulatory  Vitals:   10/15/16 0831  BP: 131/65  Pulse: 71  Resp: 17  Temp: 98 F (36.7 C)   Filed  Weights   10/15/16 0831  Weight: 149 lb (67.6 kg)    GENERAL:alert, no distress and comfortable SKIN: skin color, texture, turgor are normal, no rashes or significant lesions EYES: normal, Conjunctiva are pink and non-injected, sclera clear OROPHARYNX:no exudate, no erythema and lips, buccal mucosa, and tongue normal  NECK: supple, thyroid normal size, non-tender, without nodularity LYMPH: She has palpable lymphadenopathy throughout  LUNGS: clear to auscultation and percussion with normal breathing effort HEART: regular rate & rhythm and no murmurs and no lower extremity edema ABDOMEN:abdomen soft, non-tender and normal bowel sounds Musculoskeletal:no cyanosis of digits and no clubbing  NEURO: alert & oriented x 3 with fluent speech, no focal motor/sensory deficits  LABORATORY DATA:  I have reviewed the data as listed    Component Value Date/Time   NA 135 (L) 10/15/2016 0820   K 4.2 10/15/2016 0820   CL 98 (L) 05/11/2016 0517   CO2 27 10/15/2016 0820   GLUCOSE 97 10/15/2016 0820   BUN 22.8 10/15/2016 0820   CREATININE 1.2 (H) 10/15/2016 0820   CALCIUM 9.9 10/15/2016 0820   PROT 6.7 10/15/2016 0820   ALBUMIN 4.1 10/15/2016 0820   AST 15 10/15/2016 0820   ALT 11 10/15/2016 0820   ALKPHOS 95 10/15/2016 0820   BILITOT 0.68 10/15/2016 0820   GFRNONAA 54 (L) 05/11/2016 0517   GFRAA >60 05/11/2016 0517    No results found for: SPEP, UPEP  Lab Results  Component Value Date   WBC 90.5 (HH) 10/15/2016   NEUTROABS 0.9 (L) 10/15/2016   HGB 12.5 10/15/2016   HCT 38.4 10/15/2016   MCV 91.2 10/15/2016   PLT 215 10/15/2016      Chemistry      Component Value Date/Time   NA 135 (L) 10/15/2016 0820   K 4.2 10/15/2016 0820   CL 98 (L) 05/11/2016 0517   CO2 27 10/15/2016 0820   BUN 22.8 10/15/2016 0820   CREATININE 1.2 (H) 10/15/2016 0820      Component Value Date/Time   CALCIUM 9.9 10/15/2016 0820   ALKPHOS 95 10/15/2016 0820   AST 15 10/15/2016 0820   ALT 11 10/15/2016  0820   BILITOT 0.68 10/15/2016 0820      ASSESSMENT & PLAN:  CLL (chronic lymphocytic leukemia) (HCC) The patient has rapidly progressive cancer with new lymphadenopathy every few weeks, frequent night sweats and rapidly rising lymphocyte count I recommend we start her on treatment I recommend PET CT scan and port placement I do not feel that bone marrow biopsy will change treatment and I would defer that She will return to see me for chemo education class prior to starting treatment Given her age and comorbidities, I recommend bendamustine and rituximab combination treatment With her age, I plan to reduce dose of bendamustine by 50%  Chronic kidney disease, stage III (moderate) she will continue current medical management and risk factors modifications I recommend close follow-up with primary care doctor for medication adjustment.   Neutropenia ([place]) She has neutropenia likely due to CLL but remain asymptomatic We discussed neutropenic precaution and I educated the patient and her daughter signs and symptoms to watch out  for infection   Orders Placed This Encounter  Procedures  . NM PET Image Initial (PI) Skull Base To Thigh    Standing Status:   Future    Standing Expiration Date:   11/19/2017    Scheduling Instructions:     pls call this number: [phone] [name] Hardey (daughter)    Order Specific Question:   Reason for exam:    Answer:   SLL for staging    Order Specific Question:   Preferred imaging location?    Answer:   [place] RIGHT    Call daughter [name] [phone]    Standing Status:   Future    Standing Expiration Date:   12/16/2017    Order Specific Question:   Reason for Exam (SYMPTOM  OR DIAGNOSIS REQUIRED)    Answer:   need access for port    Order Specific Question:   Preferred Imaging Location?    Answer:   [facility]  . Hepatitis B core antibody, IgM    Standing Status:   Future    Standing  Expiration Date:   11/19/2017  . Hepatitis B surface antibody    Standing Status:   Future    Standing Expiration Date:   11/19/2017  . Hepatitis B surface antigen    Standing Status:   Future    Standing Expiration Date:   11/19/2017  . Comprehensive metabolic panel    Standing Status:   Standing    Number of Occurrences:   22    Standing Expiration Date:   10/17/2017  . CBC with Differential/Platelet    Standing Status:   Standing    Number of Occurrences:   22    Standing Expiration Date:   10/17/2017  . Uric acid    Standing Status:   Standing    Number of Occurrences:   22    Standing Expiration Date:   10/17/2017   All questions were answered. The patient knows to call the clinic with any problems, questions or concerns. No barriers to learning was detected. I spent 25 minutes counseling the patient face to face. The total time spent in the appointment was 30 minutes and more than 50% was on counseling and review of test results     Heath Lark, MD 10/17/2016 3:59 PM

## 2016-10-17 NOTE — Assessment & Plan Note (Signed)
she will continue current medical management and risk factors modifications I recommend close follow-up with primary care doctor for medication adjustment.

## 2016-10-17 NOTE — Assessment & Plan Note (Signed)
She has neutropenia likely due to CLL but remain asymptomatic We discussed neutropenic precaution and I educated the patient and her daughter signs and symptoms to watch out for infection

## 2016-10-17 NOTE — Assessment & Plan Note (Addendum)
The patient has rapidly progressive cancer with new lymphadenopathy every few weeks, frequent night sweats and rapidly rising lymphocyte count I recommend we start her on treatment I recommend PET CT scan and port placement I do not feel that bone marrow biopsy will change treatment and I would defer that She will return to see me for chemo education class prior to starting treatment Given her age and comorbidities, I recommend bendamustine and rituximab combination treatment With her age, I plan to reduce dose of bendamustine by 50%

## 2016-10-21 ENCOUNTER — Encounter (HOSPITAL_COMMUNITY)
Admission: RE | Admit: 2016-10-21 | Discharge: 2016-10-21 | Disposition: A | Discharge status: Home / Self Care | Payer: Medicare Other | Source: Ambulatory Visit | Attending: Hematology and Oncology | Admitting: Hematology and Oncology

## 2016-10-21 ENCOUNTER — Encounter (HOSPITAL_COMMUNITY): Payer: Self-pay | Admitting: Radiology

## 2016-10-21 DIAGNOSIS — C8300 Small cell B-cell lymphoma, unspecified site: Secondary | ICD-10-CM | POA: Diagnosis present

## 2016-10-21 LAB — GLUCOSE, CAPILLARY: Glucose-Capillary: 103 mg/dL — ABNORMAL HIGH (ref 65–99)

## 2016-10-21 MED ORDER — FLUDEOXYGLUCOSE F - 18 (FDG) INJECTION
7.3800 | Freq: Once | INTRAVENOUS | Status: AC | PRN
  Administered 2016-10-21: 7.38 via INTRAVENOUS

## 2016-10-22 ENCOUNTER — Other Ambulatory Visit: Payer: Self-pay | Admitting: Student

## 2016-10-22 ENCOUNTER — Other Ambulatory Visit: Payer: Self-pay | Admitting: Radiology

## 2016-10-23 ENCOUNTER — Ambulatory Visit (HOSPITAL_COMMUNITY)
Admission: RE | Admit: 2016-10-23 | Discharge: 2016-10-23 | Disposition: A | Discharge status: Home / Self Care | Payer: Medicare Other | Source: Ambulatory Visit | Attending: Hematology and Oncology | Admitting: Hematology and Oncology

## 2016-10-23 ENCOUNTER — Encounter: Payer: Self-pay | Admitting: *Deleted

## 2016-10-23 ENCOUNTER — Telehealth: Payer: Self-pay

## 2016-10-23 ENCOUNTER — Encounter (HOSPITAL_COMMUNITY): Payer: Self-pay

## 2016-10-23 ENCOUNTER — Other Ambulatory Visit: Payer: Self-pay | Admitting: Hematology and Oncology

## 2016-10-23 DIAGNOSIS — E785 Hyperlipidemia, unspecified: Secondary | ICD-10-CM | POA: Diagnosis not present

## 2016-10-23 DIAGNOSIS — C8300 Small cell B-cell lymphoma, unspecified site: Secondary | ICD-10-CM

## 2016-10-23 DIAGNOSIS — I10 Essential (primary) hypertension: Secondary | ICD-10-CM | POA: Insufficient documentation

## 2016-10-23 DIAGNOSIS — C9110 Chronic lymphocytic leukemia of B-cell type not having achieved remission: Secondary | ICD-10-CM | POA: Diagnosis not present

## 2016-10-23 DIAGNOSIS — Z87891 Personal history of nicotine dependence: Secondary | ICD-10-CM | POA: Insufficient documentation

## 2016-10-23 DIAGNOSIS — J45909 Unspecified asthma, uncomplicated: Secondary | ICD-10-CM | POA: Diagnosis not present

## 2016-10-23 DIAGNOSIS — I2510 Atherosclerotic heart disease of native coronary artery without angina pectoris: Secondary | ICD-10-CM | POA: Diagnosis not present

## 2016-10-23 HISTORY — PX: IR US GUIDE VASC ACCESS RIGHT: IMG2390

## 2016-10-23 HISTORY — PX: IR FLUORO GUIDE PORT INSERTION RIGHT: IMG5741

## 2016-10-23 LAB — COMPREHENSIVE METABOLIC PANEL
ALT: 15 U/L (ref 14–54)
ANION GAP: 9 (ref 5–15)
AST: 24 U/L (ref 15–41)
Albumin: 4.1 g/dL (ref 3.5–5.0)
Alkaline Phosphatase: 84 U/L (ref 38–126)
BILIRUBIN TOTAL: 0.3 mg/dL (ref 0.3–1.2)
BUN: 25 mg/dL — AB (ref 6–20)
CALCIUM: 9.3 mg/dL (ref 8.9–10.3)
CO2: 26 mmol/L (ref 22–32)
Chloride: 99 mmol/L — ABNORMAL LOW (ref 101–111)
Creatinine, Ser: 1.18 mg/dL — ABNORMAL HIGH (ref 0.44–1.00)
GFR calc Af Amer: 46 mL/min — ABNORMAL LOW (ref >60)
GFR, EST NON AFRICAN AMERICAN: 40 mL/min — AB (ref >60)
Glucose, Bld: 103 mg/dL — ABNORMAL HIGH (ref 65–99)
POTASSIUM: 4.4 mmol/L (ref 3.5–5.1)
Sodium: 134 mmol/L — ABNORMAL LOW (ref 135–145)
TOTAL PROTEIN: 6.7 g/dL (ref 6.5–8.1)

## 2016-10-23 LAB — CBC
HCT: 35.2 % — ABNORMAL LOW (ref 36.0–46.0)
Hemoglobin: 11.8 g/dL — ABNORMAL LOW (ref 12.0–15.0)
MCH: 29.8 pg (ref 26.0–34.0)
MCHC: 33.5 g/dL (ref 30.0–36.0)
MCV: 88.9 fL (ref 78.0–100.0)
PLATELETS: 221 10*3/uL (ref 150–400)
RBC: 3.96 MIL/uL (ref 3.87–5.11)
RDW: 15.7 % — AB (ref 11.5–15.5)
WBC: 105.3 10*3/uL (ref 4.0–10.5)

## 2016-10-23 LAB — PROTIME-INR
INR: 0.97
PROTHROMBIN TIME: 12.8 s (ref 11.4–15.2)

## 2016-10-23 LAB — APTT: aPTT: 24 seconds (ref 24–36)

## 2016-10-23 LAB — URIC ACID: URIC ACID, SERUM: 6.1 mg/dL (ref 2.3–6.6)

## 2016-10-23 MED ORDER — MIDAZOLAM HCL 2 MG/2ML IJ SOLN
INTRAMUSCULAR | Status: AC
  Filled 2016-10-23: qty 6

## 2016-10-23 MED ORDER — LIDOCAINE HCL 1 % IJ SOLN
INTRAMUSCULAR | Status: AC | PRN
  Administered 2016-10-23: 10 mL via INTRADERMAL

## 2016-10-23 MED ORDER — FENTANYL CITRATE (PF) 100 MCG/2ML IJ SOLN
INTRAMUSCULAR | Status: AC | PRN
  Administered 2016-10-23: 25 ug via INTRAVENOUS
  Administered 2016-10-23: 50 ug via INTRAVENOUS
  Administered 2016-10-23: 25 ug via INTRAVENOUS

## 2016-10-23 MED ORDER — CEFAZOLIN SODIUM-DEXTROSE 2-4 GM/100ML-% IV SOLN
INTRAVENOUS | Status: AC
  Filled 2016-10-23: qty 100

## 2016-10-23 MED ORDER — LIDOCAINE HCL 1 % IJ SOLN
INTRAMUSCULAR | Status: AC
  Filled 2016-10-23: qty 20

## 2016-10-23 MED ORDER — MIDAZOLAM HCL 2 MG/2ML IJ SOLN
INTRAMUSCULAR | Status: AC | PRN
  Administered 2016-10-23 (×3): 1 mg via INTRAVENOUS

## 2016-10-23 MED ORDER — CEFAZOLIN SODIUM-DEXTROSE 2-4 GM/100ML-% IV SOLN
2.0000 g | INTRAVENOUS | Status: AC
  Administered 2016-10-23: 2 g via INTRAVENOUS

## 2016-10-23 MED ORDER — HEPARIN SOD (PORK) LOCK FLUSH 100 UNIT/ML IV SOLN
INTRAVENOUS | Status: AC
  Filled 2016-10-23: qty 5

## 2016-10-23 MED ORDER — FENTANYL CITRATE (PF) 100 MCG/2ML IJ SOLN
INTRAMUSCULAR | Status: AC
  Filled 2016-10-23: qty 6

## 2016-10-23 MED ORDER — SODIUM CHLORIDE 0.9 % IV SOLN
INTRAVENOUS | Status: DC
  Administered 2016-10-23: 08:00:00 via INTRAVENOUS

## 2016-10-23 NOTE — Procedures (Signed)
Interventional Radiology Procedure Note  Procedure: Placement of a right IJ approach single lumen PowerPort.  Tip is positioned at the superior cavoatrial junction and catheter is ready for immediate use.  Complications: No immediate Recommendations:  - Ok to shower tomorrow - Do not submerge for 7 days - Routine line care   Eldwin Volkov T. Orlena Garmon, M.D Pager:  [phone]   

## 2016-10-23 NOTE — H&P (Signed)
Chief Complaint: Chronic lymphocytic leukemia  Referring Physician(s): [place]  Supervising Physician: Aletta Edouard  Patient Status: [facility] - Out-pt  History of Present Illness: Carla Conway is a 83 y.o. female who was initially found to have an abnormal CBC starting back in 2015.  She has had leukocytosis since 2015.  She also had lymphadenopathy.  Peripheral blood flow cytometry done 09/22/2016 showed CLL   Per Dr.Gorsuch note, she has rapidly progressive cancer with new lymphadenopathy every few weeks, frequent night sweats, and rapidly rising lymphocyte count.  She needs to begin chemotherapy.  She is here today for placement of a Port A Cath.  She feels well today, no fever/chills, no N/V. She is NPO.  Past Medical History:  Diagnosis Date  . Asthma   . CAD (coronary artery disease)   . Hyperlipidemia   . Hypertension     Past Surgical History:  Procedure Laterality Date  . CATARACT EXTRACTION    . CORONARY ANGIOPLASTY  10 -15 years ago   stent in [place] ,[place]    Allergies: Patient has no known allergies.  Medications: Prior to Admission medications   Medication Sig Start Date End Date Taking? Authorizing Provider  COMBIVENT RESPIMAT 20-100 MCG/ACT AERS respimat Take 2 puffs by mouth every 6 (six) hours as needed.  04/16/13   [provider]  irbesartan (AVAPRO) 300 MG tablet Take 300 mg by mouth daily.  05/09/13   [provider]  isosorbide mononitrate (IMDUR) 30 MG 24 hr tablet Take 30 mg by mouth daily.  05/09/13   [provider]  lovastatin (MEVACOR) 40 MG tablet Take 40 mg by mouth every evening.  05/09/13   [provider]  metoprolol succinate (TOPROL-XL) 100 MG 24 hr tablet Take 100 mg by mouth daily.  05/02/13   [provider]  montelukast (SINGULAIR) 10 MG tablet Take 10 mg by mouth every evening.  05/09/13   [provider]  potassium chloride (K-DUR) 10 MEQ tablet Take 10 mEq by mouth  daily. 03/24/16   [provider]  QUEtiapine (SEROQUEL) 25 MG tablet Take 12.5 mg by mouth daily.     [provider]  triamterene-hydrochlorothiazide (MAXZIDE-25) 37.5-25 MG tablet Take 1 tablet by mouth daily.     [provider]     Family History  Problem Relation Age of Onset  . Cancer Father        colon    Social History   Social History  . Marital status: Married    Spouse name: N/A  . Number of children: 5  . Years of education: N/A   Social History Main Topics  . Smoking status: Former Smoker    Types: Cigarettes    Start date: 05/18/1983  . Smokeless tobacco: Never Used  . Alcohol use No  . Drug use: No  . Sexual activity: Not Asked   Other Topics Concern  . None   Social History Narrative   Lives in retirement home.      Review of Systems: A 12 point ROS discussed  Review of Systems  Constitutional: Positive for activity change and fatigue.       Night sweats  HENT: Negative.   Respiratory: Negative.   Cardiovascular: Negative.   Gastrointestinal: Negative.   Genitourinary: Negative.   Musculoskeletal: Negative.   Skin: Negative.   Neurological: Negative.   Hematological: Negative.   Psychiatric/Behavioral: Negative.     Vital Signs: BP (!) 154/73   Pulse 78   Temp  98.2 F (36.8 C) (Oral)   Resp 16   SpO2 98%   Physical Exam  Constitutional: She is oriented to person, place, and time.  Appears younger than chronological age.  HENT:  Head: Normocephalic and atraumatic.  Eyes: EOM are normal.  Neck: Normal range of motion.  Cardiovascular: Normal rate, regular rhythm and normal heart sounds.   Pulmonary/Chest: Effort normal and breath sounds normal. No respiratory distress. She has no wheezes.  Abdominal: Soft. She exhibits no distension. There is no tenderness.  Musculoskeletal: Normal range of motion.  Neurological: She is alert and oriented to person, place, and time.  Skin: Skin is warm and dry.    Psychiatric: She has a normal mood and affect. Her behavior is normal. Judgment and thought content normal.  Vitals reviewed.   Mallampati Score:  MD Evaluation Airway: WNL Heart: WNL Abdomen: WNL Chest/ Lungs: WNL ASA  Classification: 3 Mallampati/Airway Score: One  Imaging: Nm Pet Image Initial (pi) Skull Base To Thigh  Result Date: 10/21/2016 CLINICAL DATA:  Initial treatment strategy for small lymphocytic lymphoma. EXAM: NUCLEAR MEDICINE PET SKULL BASE TO THIGH TECHNIQUE: 7.4 mCi F-18 FDG was injected intravenously. Full-ring PET imaging was performed from the skull base to thigh after the radiotracer. CT data was obtained and used for attenuation correction and anatomic localization. FASTING BLOOD GLUCOSE:  Value: 103 mg/dl COMPARISON:  CT scans from 05/08/2016 FINDINGS: NECK Extensive superficial and deep parotid lymph nodes along with lymph nodes in levels IIa, IIb, Ib, Ia, IV, III, and V bilaterally these demonstrate relatively low-grade activity, with a new right submandibular node on image 35/4 measuring 1.3 cm in diameter with maximum standard uptake value 3.6. CHEST Bilateral supraclavicular fossa, subpectoral, axillary, paratracheal, prevascular, new AP window, subcarinal, hilar, and right internal mammary adenopathy noted. An index right supraclavicular node measuring 2.1 cm in short axis on image 48/4 has a maximum SUV new of 3.7. An index left axillary node on image 58/4 measuring 1.7 cm in short axis has a maximum SUV of 3.2. Coronary, aortic arch, and branch vessel atherosclerotic vascular disease. Atelectasis or scarring in the lower lobes bilaterally. Background blood pool activity in the mediastinum 3.2. ABDOMEN/PELVIS New new pathologic porta hepatis, gastrohepatic ligament, peripancreatic, retroperitoneal, mesenteric, common iliac, external iliac, internal iliac, perirectal, and inguinal adenopathy noted. An index left periaortic node measuring 2.1 cm in short axis on image  122/4 has a maximum SUV of 4.1. New new an index right external iliac node measuring 1.8 cm in short axis on image 166/4 has a maximum SUV of 4.4. There is sigmoid diverticulosis with some confluent mesenteric edema and mesenteric lymph nodes adjacent to the diverticulosis, diverticulitis is suspected. There is some focal hypermetabolic activity in this vicinity of suspected inflammation along the sigmoid colon, maximum SUV 8.4. No extraluminal gas. There some scattered enlarged omental lymph nodes as well, for example on image 123/4. The spleen measures 12.3 by 6.2 by 13.0 cm (volume = 520 cm^3). There is scattered hypodense lesions in the liver, with the larger lesions photopenic compared to hepatic parenchyma. The smaller lesions are technically nonspecific. There is an exophytic high density lesion from the right kidney upper pole measuring 1.5 cm in diameter on image 113/4 new new new, I do not see appreciable hypermetabolic activity and accordingly this is probably a complex cyst, although not considered 100% specific. Aortoiliac atherosclerotic vascular disease. Descending colon diverticulosis is present. There is high activity along some soft tissue prominence in the region of the anus, somewhat  resembling an external hemorrhoid. Some of this activity could be from urinary contamination/ incontinence. Maximum SUV 13.8. Background hepatic SUV 4.0. SKELETON No hypermetabolic skeletal lesions are identified. There is evidence of lumbar spondylosis and degenerative disc disease. New IMPRESSION: 1. Extensive adenopathy in the neck, chest, abdomen, and pelvis as detailed above, primarily Deauville 3 and Deauville 4. Mild splenomegaly. 2. High activity in some soft tissue prominence along the anal region, possibly physiologic or due to hemorrhoidal activity or urinary incontinence, but correlation with anorectal exam recommended. 3. Focal abnormal accentuated high activity in the sigmoid colon along a region of  abnormal stranding and diverticulosis, suspicious for active diverticulitis. Correlation with the patient's colon cancer screening history is recommended. If screening is not up-to-date, appropriate screening should be considered. 4.  Aortic Atherosclerosis (ICD10-I70.0).  Coronary atherosclerosis. 5. An exophytic high density lesion from the right kidney upper pole does not appear to be hypermetabolic and accordingly is most likely to be a benign complex cyst, but this lesion is not fully characterized on today' s exam. If the patient has hematuria or if otherwise clinically warranted, renal protocol MRI or CT examination could be utilized. 6. Some of the smaller hepatic lesions are nonspecific, but the larger hepatic lesions are photopenic and likely cysts. Electronically Signed   By: Van Clines M.D.   On: 10/21/2016 11:28    Labs:  CBC:  Recent Labs  05/09/16 0550 09/22/16 1004 10/15/16 0820 10/23/16 0744  WBC 35.9* 87.0* 90.5* 105.3*  HGB 10.4* 12.3 12.5 11.8*  HCT 29.8* 38.5 38.4 35.2*  PLT 181 202 215 221    COAGS:  Recent Labs  10/23/16 0744  INR 0.97  APTT 24    BMP:  Recent Labs  05/09/16 0550 05/10/16 1010 05/11/16 0517 09/22/16 1002 10/15/16 0820 10/23/16 0744  NA 130* 132* 132* 133* 135* 134*  K 3.0* 3.3* 3.5 4.0 4.2 4.4  CL 95* 97* 98*  --   --  99*  CO2 25 26 25 27 27 26   GLUCOSE 97 108* 178* 102 97 103*  BUN 13 10 22* 21.2 22.8 25*  CALCIUM 8.4* 8.3* 8.3* 9.8 9.9 9.3  CREATININE 0.99 1.03* 0.93 1.1 1.2* 1.18*  GFRNONAA 50* 47* 54*  --   --  40*  GFRAA 58* 55* >60  --   --  46*    LIVER FUNCTION TESTS:  Recent Labs  09/22/16 1002 10/15/16 0820 10/23/16 0744  BILITOT 0.57 0.68 0.3  AST 18 15 24   ALT 13 11 15   ALKPHOS 94 95 84  PROT 6.8 6.7 6.7  ALBUMIN 4.1 4.1 4.1    TUMOR MARKERS: No results for input(s): AFPTM, CEA, CA199, CHROMGRNA in the last 8760 hours.  Assessment and Plan:  CLL - rapidly progressive.  Will proceed  with placement of Port A Cath today by Dr. Kathlene Cote.  Risks and Benefits discussed with the patient including, but not limited to bleeding, infection, pneumothorax, or fibrin sheath development and need for additional procedures.  All of the patient's questions were answered, patient is agreeable to proceed. Consent signed and in chart.  Thank you for this interesting consult.  I greatly enjoyed meeting Carla Conway and look forward to participating in their care.  A copy of this report was sent to the requesting provider on this date.  Electronically Signed: Murrell Redden, PA-C 10/23/2016, 8:37 AM   I spent a total of  30 Minutes in face to face in clinical consultation, greater than 50%  of which was counseling/coordinating care for [facility] A Cath

## 2016-10-23 NOTE — Sedation Documentation (Signed)
Patient is resting comfortably. 

## 2016-10-23 NOTE — Sedation Documentation (Signed)
Pt reports discomfort during procedure stating that the MD was "pushing down hard."

## 2016-10-23 NOTE — Telephone Encounter (Signed)
Called with below message, verbalized understanding. 

## 2016-10-23 NOTE — Telephone Encounter (Signed)
-----   Message from Heath Lark, MD sent at 10/23/2016 11:24 AM EDT ----- Regarding: labs today Tell her daughter labs drawn at IR is OK When she sees me next week OK to cancel labs appt

## 2016-10-23 NOTE — Discharge Instructions (Signed)
Implanted Port Insertion, Care After °This sheet gives you information about how to care for yourself after your procedure. Your health care provider may also give you more specific instructions. If you have problems or questions, contact your health care provider. °What can I expect after the procedure? °After your procedure, it is common to have: °· Discomfort at the port insertion site. °· Bruising on the skin over the port. This should improve over 3-4 days. ° °Follow these instructions at home: °Port care °· After your port is placed, you will get a manufacturer's information card. The card has information about your port. Keep this card with you at all times. °· Take care of the port as told by your health care provider. Ask your health care provider if you or a family member can get training for taking care of the port at home. A home health care nurse may also take care of the port. °· Make sure to remember what type of port you have. °Incision care °· Follow instructions from your health care provider about how to take care of your port insertion site. Make sure you: °? Wash your hands with soap and water before you change your bandage (dressing). If soap and water are not available, use hand sanitizer. °? Change your dressing as told by your health care provider. °? Leave stitches (sutures), skin glue, or adhesive strips in place. These skin closures may need to stay in place for 2 weeks or longer. If adhesive strip edges start to loosen and curl up, you may trim the loose edges. Do not remove adhesive strips completely unless your health care provider tells you to do that. °· Check your port insertion site every day for signs of infection. Check for: °? More redness, swelling, or pain. °? More fluid or blood. °? Warmth. °? Pus or a bad smell. °General instructions °· Do not take baths, swim, or use a hot tub until your health care provider approves. °· Do not lift anything that is heavier than 10 lb (4.5  kg) for a week, or as told by your health care provider. °· Ask your health care provider when it is okay to: °? Return to work or school. °? Resume usual physical activities or sports. °· Do not drive for 24 hours if you were given a medicine to help you relax (sedative). °· Take over-the-counter and prescription medicines only as told by your health care provider. °· Wear a medical alert bracelet in case of an emergency. This will tell any health care providers that you have a port. °· Keep all follow-up visits as told by your health care provider. This is important. °Contact a health care provider if: °· You cannot flush your port with saline as directed, or you cannot draw blood from the port. °· You have a fever or chills. °· You have more redness, swelling, or pain around your port insertion site. °· You have more fluid or blood coming from your port insertion site. °· Your port insertion site feels warm to the touch. °· You have pus or a bad smell coming from the port insertion site. °Get help right away if: °· You have chest pain or shortness of breath. °· You have bleeding from your port that you cannot control. °Summary °· Take care of the port as told by your health care provider. °· Change your dressing as told by your health care provider. °· Keep all follow-up visits as told by your health care provider. °  This information is not intended to replace advice given to you by your health care provider. Make sure you discuss any questions you have with your health care provider. °Document Released: 01/12/2013 Document Revised: 02/13/2016 Document Reviewed: 02/13/2016 °Elsevier Interactive Patient Education © 2017 [employer] °Moderate Conscious Sedation, Adult, Care After °These instructions provide you with information about caring for yourself after your procedure. Your health care provider may also give you more specific instructions. Your treatment has been planned according to current medical  practices, but problems sometimes occur. Call your health care provider if you have any problems or questions after your procedure. °What can I expect after the procedure? °After your procedure, it is common: °· To feel sleepy for several hours. °· To feel clumsy and have poor balance for several hours. °· To have poor judgment for several hours. °· To vomit if you eat too soon. ° °Follow these instructions at home: °For at least 24 hours after the procedure: ° °· Do not: °? Participate in activities where you could fall or become injured. °? Drive. °? Use heavy machinery. °? Drink alcohol. °? Take sleeping pills or medicines that cause drowsiness. °? Make important decisions or sign legal documents. °? Take care of children on your own. °· Rest. °Eating and drinking °· Follow the diet recommended by your health care provider. °· If you vomit: °? Drink water, juice, or soup when you can drink without vomiting. °? Make sure you have little or no nausea before eating solid foods. °General instructions °· Have a responsible adult stay with you until you are awake and alert. °· Take over-the-counter and prescription medicines only as told by your health care provider. °· If you smoke, do not smoke without supervision. °· Keep all follow-up visits as told by your health care provider. This is important. °Contact a health care provider if: °· You keep feeling nauseous or you keep vomiting. °· You feel light-headed. °· You develop a rash. °· You have a fever. °Get help right away if: °· You have trouble breathing. °This information is not intended to replace advice given to you by your health care provider. Make sure you discuss any questions you have with your health care provider. °Document Released: 01/12/2013 Document Revised: 08/27/2015 Document Reviewed: 07/14/2015 °Elsevier Interactive Patient Education © 2018 [employer] ° °

## 2016-10-23 NOTE — Progress Notes (Unsigned)
Critical WBC results were given to Dr. Alvy Bimler by Nadara Mustard at [facility] on 10/23/16

## 2016-10-24 ENCOUNTER — Encounter: Payer: Self-pay | Admitting: Pharmacist

## 2016-10-24 LAB — HEPATITIS B CORE ANTIBODY, IGM: HEP B C IGM: NEGATIVE

## 2016-10-24 LAB — HEPATITIS B SURFACE ANTIBODY,QUALITATIVE: Hep B S Ab: NONREACTIVE

## 2016-10-27 ENCOUNTER — Other Ambulatory Visit: Payer: Self-pay | Admitting: Hematology and Oncology

## 2016-10-27 ENCOUNTER — Other Ambulatory Visit: Payer: Medicare Other

## 2016-10-27 ENCOUNTER — Ambulatory Visit (HOSPITAL_BASED_OUTPATIENT_CLINIC_OR_DEPARTMENT_OTHER): Payer: Medicare Other | Admitting: Hematology and Oncology

## 2016-10-27 VITALS — BP 190/72 | HR 85 | Temp 98.2°F | Resp 18 | Ht 62.0 in | Wt 151.6 lb

## 2016-10-27 DIAGNOSIS — N183 Chronic kidney disease, stage 3 unspecified: Secondary | ICD-10-CM

## 2016-10-27 DIAGNOSIS — C9110 Chronic lymphocytic leukemia of B-cell type not having achieved remission: Secondary | ICD-10-CM

## 2016-10-27 DIAGNOSIS — Z7189 Other specified counseling: Secondary | ICD-10-CM

## 2016-10-27 DIAGNOSIS — I10 Essential (primary) hypertension: Secondary | ICD-10-CM | POA: Diagnosis not present

## 2016-10-27 DIAGNOSIS — C8300 Small cell B-cell lymphoma, unspecified site: Secondary | ICD-10-CM

## 2016-10-27 MED ORDER — LIDOCAINE-PRILOCAINE 2.5-2.5 % EX CREA: 1.0000 "application " | TOPICAL_CREAM | CUTANEOUS | 6 refills | Status: DC | PRN

## 2016-10-27 MED ORDER — AMLODIPINE BESYLATE 10 MG PO TABS: 10.0000 mg | ORAL_TABLET | Freq: Every day | ORAL | 0 refills | Status: DC

## 2016-10-27 MED ORDER — PREDNISONE 50 MG PO TABS: 50.0000 mg | ORAL_TABLET | Freq: Every day | ORAL | 0 refills | Status: DC

## 2016-10-27 MED ORDER — ALLOPURINOL 300 MG PO TABS: 300.0000 mg | ORAL_TABLET | Freq: Every day | ORAL | 0 refills | Status: DC

## 2016-10-27 MED ORDER — PROMETHAZINE HCL 12.5 MG PO TABS: 12.5000 mg | ORAL_TABLET | Freq: Four times a day (QID) | ORAL | 3 refills | Status: DC | PRN

## 2016-10-27 MED ORDER — ONDANSETRON HCL 8 MG PO TABS: 8.0000 mg | ORAL_TABLET | Freq: Three times a day (TID) | ORAL | 3 refills | Status: DC | PRN

## 2016-10-27 NOTE — Telephone Encounter (Signed)
30 days supply of allopurinol or amlodipine is not necessary

## 2016-10-28 ENCOUNTER — Encounter: Payer: Self-pay | Admitting: Hematology and Oncology

## 2016-10-28 DIAGNOSIS — Z7189 Other specified counseling: Secondary | ICD-10-CM | POA: Insufficient documentation

## 2016-10-28 NOTE — Assessment & Plan Note (Signed)
She has baseline reduced kidney function I recommend aggressive hydration and discontinuation of diuretic therapy in a short-term

## 2016-10-28 NOTE — Assessment & Plan Note (Signed)
She has significant hypertension There is also a component of anxiety As above, I will substitute diuretic with amlodipine

## 2016-10-28 NOTE — Assessment & Plan Note (Addendum)
The patient is aware she has incurable disease and treatment is palliative but with high rate of remission status We discussed importance of Advanced Directives and Living will. Her daughter is her dedicated healthcare power of attorney

## 2016-10-28 NOTE — Progress Notes (Signed)
[facility] OFFICE PROGRESS NOTE  Patient Care Team: Koirala, Dibas, MD as PCP - General (Family Medicine)  SUMMARY OF ONCOLOGIC HISTORY:   CLL (chronic lymphocytic leukemia) ([place])   05/02/2013 Initial Diagnosis    She was found to have abnormal CBC from at least since 2015. According to the records from her primary care doctor that were faxed to the [facility], she had leukocytosis at least since January 2015. On 05/02/2013, total white blood cell count was 28.3, normal hemoglobin 13.4 and platelet count 239,000. She had predominantly Lymphocytosis. On 06/20/2013, a CBC show white count of 26.7, hemoglobin 12.9 and platelet count of 299,000 On admission, on 05/08/2016, white blood cell count was 38.4, hemoglobin 12.1 and platelet count of 210. Differential show predominantly lymphocytosis.      05/08/2016 Imaging    CT scan of chest 1. Negative for acute pulmonary embolism 2. Numerous pathologic nodes in the mediastinum, hila, axillae, supraclavicular regions and upper abdomen. The adenopathy is suspicious for malignancy such as lymphoma or CLL. If tissue diagnosis is desired, many of the supraclavicular and axillary nodes should be palpable and accessible to excisional biopsy. 3. Low-attenuation liver lesions, incompletely imaged and not characterized but more likely benign. 4. Small hiatal hernia.      09/22/2016 Pathology Results    Peripheral Blood Flow Cytometry - CHRONIC LYMPHOCYTIC LEUKEMIA. - SEE COMMENT. Diagnosis Comment: There is a population of monoclonal B-cells with expression of CD5 and CD23. The phenotype is consistent with chronic lymphocytic leukemia      09/22/2016 Pathology Results    FISH confirmed trisomy 12      10/21/2016 PET scan    1. Extensive adenopathy in the neck, chest, abdomen, and pelvis as detailed above, primarily Deauville 3 and Deauville 4. Mild splenomegaly. 2. High activity in some soft tissue prominence along the anal region,  possibly physiologic or due to hemorrhoidal activity or urinary incontinence, but correlation with anorectal exam recommended. 3. Focal abnormal accentuated high activity in the sigmoid colon along a region of abnormal stranding and diverticulosis, suspicious for active diverticulitis. Correlation with the patient's colon cancer screening history is recommended. If screening is not up-to-date, appropriate screening should be considered. 4. Aortic Atherosclerosis (ICD10-I70.0). Coronary atherosclerosis. 5. An exophytic high density lesion from the right kidney upper pole does not appear to be hypermetabolic and accordingly is most likely to be a benign complex cyst, but this lesion is not fully characterized on today' s exam. If the patient has hematuria or if otherwise clinically warranted, renal protocol MRI or CT examination could be utilized. 6. Some of the smaller hepatic lesions are nonspecific, but the larger hepatic lesions are photopenic and likely cysts.      10/23/2016 Procedure    Placement of single lumen port a cath via right internal jugular vein. The catheter tip lies at the cavo-atrial junction. A power injectable port a cath was placed and is ready for immediate use       INTERVAL HISTORY: Please see below for problem oriented charting. She returns with her daughter for chemotherapy consent She continues to experience new lymphadenopathy Appetite is stable She has no recent weight loss  REVIEW OF SYSTEMS:   Constitutional: Denies fevers, chills or abnormal weight loss Eyes: Denies blurriness of vision Ears, nose, mouth, throat, and face: Denies mucositis or sore throat Respiratory: Denies cough, dyspnea or wheezes Cardiovascular: Denies palpitation, chest discomfort or lower extremity swelling Gastrointestinal:  Denies nausea, heartburn or change in bowel habits Skin:  Denies abnormal skin rashes Neurological:Denies numbness, tingling or new weaknesses Behavioral/Psych:  Mood is stable, no new changes  All other systems were reviewed with the patient and are negative.  I have reviewed the past medical history, past surgical history, social history and family history with the patient and they are unchanged from previous note.  ALLERGIES:  has No Known Allergies.  MEDICATIONS:  Current Outpatient Prescriptions  Medication Sig Dispense Refill  . allopurinol (ZYLOPRIM) 300 MG tablet TAKE 1 TABLET(300 MG) BY MOUTH DAILY 30 tablet 0  . amLODipine (NORVASC) 10 MG tablet TAKE 1 TABLET(10 MG) BY MOUTH DAILY 30 tablet 0  . COMBIVENT RESPIMAT 20-100 MCG/ACT AERS respimat Take 2 puffs by mouth every 6 (six) hours as needed.     . irbesartan (AVAPRO) 300 MG tablet Take 300 mg by mouth daily.     . isosorbide mononitrate (IMDUR) 30 MG 24 hr tablet Take 30 mg by mouth daily.     Marland Kitchen lidocaine-prilocaine (EMLA) cream Apply 1 application topically as needed. 30 g 6  . lovastatin (MEVACOR) 40 MG tablet Take 40 mg by mouth every evening.     . metoprolol succinate (TOPROL-XL) 100 MG 24 hr tablet Take 100 mg by mouth daily.     . montelukast (SINGULAIR) 10 MG tablet Take 10 mg by mouth every evening.     . ondansetron (ZOFRAN) 8 MG tablet Take 1 tablet (8 mg total) by mouth every 8 (eight) hours as needed for nausea. 30 tablet 3  . potassium chloride (K-DUR) 10 MEQ tablet Take 10 mEq by mouth daily.    . predniSONE (DELTASONE) 50 MG tablet Take 1 tablet (50 mg total) by mouth daily with breakfast. 3 tablet 0  . promethazine (PHENERGAN) 12.5 MG tablet Take 1 tablet (12.5 mg total) by mouth every 6 (six) hours as needed for nausea. 30 tablet 3  . QUEtiapine (SEROQUEL) 25 MG tablet Take 12.5 mg by mouth daily.     Marland Kitchen triamterene-hydrochlorothiazide (MAXZIDE-25) 37.5-25 MG tablet Take 1 tablet by mouth daily.      No current facility-administered medications for this visit.     PHYSICAL EXAMINATION: ECOG PERFORMANCE STATUS: 1 - Symptomatic but completely ambulatory  Vitals:    10/27/16 0946  BP: (!) 190/72  Pulse: 85  Resp: 18  Temp: 98.2 F (36.8 C)   Filed Weights   10/27/16 0946  Weight: 151 lb 9.6 oz (68.8 kg)    GENERAL:alert, no distress and comfortable.  She appears anxious SKIN: skin color, texture, turgor are normal, no rashes or significant lesions EYES: normal, Conjunctiva are pink and non-injected, sclera clear OROPHARYNX:no exudate, no erythema and lips, buccal mucosa, and tongue normal  NECK: supple, thyroid normal size, non-tender, without nodularity LYMPH: She has palpable lymphadenopathy on both sides of her neck LUNGS: clear to auscultation and percussion with normal breathing effort HEART: regular rate & rhythm and no murmurs and no lower extremity edema ABDOMEN:abdomen soft, non-tender and normal bowel sounds Musculoskeletal:no cyanosis of digits and no clubbing  NEURO: alert & oriented x 3 with fluent speech, no focal motor/sensory deficits  LABORATORY DATA:  I have reviewed the data as listed    Component Value Date/Time   NA 134 (L) 10/23/2016 0744   NA 135 (L) 10/15/2016 0820   K 4.4 10/23/2016 0744   K 4.2 10/15/2016 0820   CL 99 (L) 10/23/2016 0744   CO2 26 10/23/2016 0744   CO2 27 10/15/2016 0820   GLUCOSE 103 (H) 10/23/2016 0744  GLUCOSE 97 10/15/2016 0820   BUN 25 (H) 10/23/2016 0744   BUN 22.8 10/15/2016 0820   CREATININE 1.18 (H) 10/23/2016 0744   CREATININE 1.2 (H) 10/15/2016 0820   CALCIUM 9.3 10/23/2016 0744   CALCIUM 9.9 10/15/2016 0820   PROT 6.7 10/23/2016 0744   PROT 6.7 10/15/2016 0820   ALBUMIN 4.1 10/23/2016 0744   ALBUMIN 4.1 10/15/2016 0820   AST 24 10/23/2016 0744   AST 15 10/15/2016 0820   ALT 15 10/23/2016 0744   ALT 11 10/15/2016 0820   ALKPHOS 84 10/23/2016 0744   ALKPHOS 95 10/15/2016 0820   BILITOT 0.3 10/23/2016 0744   BILITOT 0.68 10/15/2016 0820   GFRNONAA 40 (L) 10/23/2016 0744   GFRAA 46 (L) 10/23/2016 0744    No results found for: SPEP, UPEP  Lab Results  Component Value  Date   WBC 105.3 (HH) 10/23/2016   NEUTROABS 0.9 (L) 10/15/2016   HGB 11.8 (L) 10/23/2016   HCT 35.2 (L) 10/23/2016   MCV 88.9 10/23/2016   PLT 221 10/23/2016      Chemistry      Component Value Date/Time   NA 134 (L) 10/23/2016 0744   NA 135 (L) 10/15/2016 0820   K 4.4 10/23/2016 0744   K 4.2 10/15/2016 0820   CL 99 (L) 10/23/2016 0744   CO2 26 10/23/2016 0744   CO2 27 10/15/2016 0820   BUN 25 (H) 10/23/2016 0744   BUN 22.8 10/15/2016 0820   CREATININE 1.18 (H) 10/23/2016 0744   CREATININE 1.2 (H) 10/15/2016 0820      Component Value Date/Time   CALCIUM 9.3 10/23/2016 0744   CALCIUM 9.9 10/15/2016 0820   ALKPHOS 84 10/23/2016 0744   ALKPHOS 95 10/15/2016 0820   AST 24 10/23/2016 0744   AST 15 10/15/2016 0820   ALT 15 10/23/2016 0744   ALT 11 10/15/2016 0820   BILITOT 0.3 10/23/2016 0744   BILITOT 0.68 10/15/2016 0820       RADIOGRAPHIC STUDIES: I have reviewed her imaging study with the daughter I have personally reviewed the radiological images as listed and agreed with the findings in the report. 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 EXAM: NUCLEAR MEDICINE PET SKULL BASE TO THIGH TECHNIQUE: 7.4 mCi F-18 FDG was injected intravenously. Full-ring PET imaging was performed from the skull base to thigh after the radiotracer. CT data was obtained and used for attenuation correction and anatomic localization. FASTING BLOOD GLUCOSE:  Value: 103 mg/dl COMPARISON:  CT scans from 05/08/2016 FINDINGS: NECK Extensive superficial and deep parotid lymph nodes along with lymph nodes in levels IIa, IIb, Ib, Ia, IV, III, and V bilaterally these demonstrate relatively low-grade activity, with a new right submandibular node on image 35/4 measuring 1.3 cm in diameter with maximum standard uptake value 3.6. CHEST Bilateral supraclavicular fossa, subpectoral, axillary, paratracheal,  prevascular, new AP window, subcarinal, hilar, and right internal mammary adenopathy noted. An index right supraclavicular node measuring 2.1 cm in short axis on image 48/4 has a maximum SUV new of 3.7. An index left axillary node on image 58/4 measuring 1.7 cm in short axis has a maximum SUV of 3.2. Coronary, aortic arch, and branch vessel atherosclerotic vascular disease. Atelectasis or scarring in the lower lobes bilaterally. Background blood pool activity in the mediastinum 3.2. ABDOMEN/PELVIS New new pathologic porta hepatis, gastrohepatic ligament, peripancreatic, retroperitoneal, mesenteric, common iliac, external iliac, internal iliac, perirectal, and  inguinal adenopathy noted. An index left periaortic node measuring 2.1 cm in short axis on image 122/4 has a maximum SUV of 4.1. New new an index right external iliac node measuring 1.8 cm in short axis on image 166/4 has a maximum SUV of 4.4. There is sigmoid diverticulosis with some confluent mesenteric edema and mesenteric lymph nodes adjacent to the diverticulosis, diverticulitis is suspected. There is some focal hypermetabolic activity in this vicinity of suspected inflammation along the sigmoid colon, maximum SUV 8.4. No extraluminal gas. There some scattered enlarged omental lymph nodes as well, for example on image 123/4. The spleen measures 12.3 by 6.2 by 13.0 cm (volume = 520 cm^3). There is scattered hypodense lesions in the liver, with the larger lesions photopenic compared to hepatic parenchyma. The smaller lesions are technically nonspecific. There is an exophytic high density lesion from the right kidney upper pole measuring 1.5 cm in diameter on image 113/4 new new new, I do not see appreciable hypermetabolic activity and accordingly this is probably a complex cyst, although not considered 100% specific. Aortoiliac atherosclerotic vascular disease. Descending colon diverticulosis is present. There is high activity along some soft tissue  prominence in the region of the anus, somewhat resembling an external hemorrhoid. Some of this activity could be from urinary contamination/ incontinence. Maximum SUV 13.8. Background hepatic SUV 4.0. SKELETON No hypermetabolic skeletal lesions are identified. There is evidence of lumbar spondylosis and degenerative disc disease. New IMPRESSION: 1. Extensive adenopathy in the neck, chest, abdomen, and pelvis as detailed above, primarily Deauville 3 and Deauville 4. Mild splenomegaly. 2. High activity in some soft tissue prominence along the anal region, possibly physiologic or due to hemorrhoidal activity or urinary incontinence, but correlation with anorectal exam recommended. 3. Focal abnormal accentuated high activity in the sigmoid colon along a region of abnormal stranding and diverticulosis, suspicious for active diverticulitis. Correlation with the patient's colon cancer screening history is recommended. If screening is not up-to-date, appropriate screening should be considered. 4.  Aortic Atherosclerosis (ICD10-I70.0).  Coronary atherosclerosis. 5. An exophytic high density lesion from the right kidney upper pole does not appear to be hypermetabolic and accordingly is most likely to be a benign complex cyst, but this lesion is not fully characterized on today' s exam. If the patient has hematuria or if otherwise clinically warranted, renal protocol MRI or CT examination could be utilized. 6. Some of the smaller hepatic lesions are nonspecific, but the larger hepatic lesions are photopenic and likely cysts. Electronically Signed   By: Van Clines M.D.   On: 10/21/2016 11:28   Ir US Guide Vasc Access Right  Result Date: 10/23/2016 CLINICAL DATA:  Progressive chronic lymphocytic leukemia and need for porta cath for chemotherapy. EXAM: IMPLANTED PORT A CATH PLACEMENT WITH ULTRASOUND AND FLUOROSCOPIC GUIDANCE ANESTHESIA/SEDATION: 3.0 mg IV Versed; 100 mcg IV Fentanyl Total Moderate Sedation Time:  37  minutes The patient's level of consciousness and physiologic status were continuously monitored during the procedure by Radiology nursing. Additional Medications: 2 g IV Ancef. As antibiotic prophylaxis, Ancef was ordered pre-procedure and administered intravenously within one hour of incision. FLUOROSCOPY TIME:  24 seconds.  2.4 mGy. PROCEDURE: The procedure, risks, benefits, and alternatives were explained to the patient. Questions regarding the procedure were encouraged and answered. The patient understands and consents to the procedure. A time-out was performed prior to initiating the procedure. Ultrasound was utilized to confirm patency of the right internal jugular vein. The right neck and chest were prepped with chlorhexidine in a sterile  fashion, and a sterile drape was applied covering the operative field. Maximum barrier sterile technique with sterile gowns and gloves were used for the procedure. Local anesthesia was provided with 1% lidocaine. After creating a small venotomy incision, a 21 gauge needle was advanced into the right internal jugular vein under direct, real-time ultrasound guidance. Ultrasound image documentation was performed. After securing guidewire access, an 8 Fr dilator was placed. A J-wire was kinked to measure appropriate catheter length. A subcutaneous port pocket was then created along the upper chest wall utilizing sharp and blunt dissection. Portable cautery was utilized. The pocket was irrigated with sterile saline. A single lumen power injectable port was chosen for placement. The 8 Fr catheter was tunneled from the port pocket site to the venotomy incision. The port was placed in the pocket. External catheter was trimmed to appropriate length based on guidewire measurement. At the venotomy, an 8 Fr peel-away sheath was placed over a guidewire. The catheter was then placed through the sheath and the sheath removed. Final catheter positioning was confirmed and documented with a  fluoroscopic spot image. The port was accessed with a needle and aspirated and flushed with heparinized saline. The access needle was removed. The venotomy and port pocket incisions were closed with subcutaneous 3-0 Monocryl and subcuticular 4-0 Vicryl. Dermabond was applied to both incisions. COMPLICATIONS: COMPLICATIONS None FINDINGS: After catheter placement, the tip lies at the cavo-atrial junction. The catheter aspirates normally and is ready for immediate use. IMPRESSION: Placement of single lumen port a cath via right internal jugular vein. The catheter tip lies at the cavo-atrial junction. A power injectable port a cath was placed and is ready for immediate use. Electronically Signed   By: Aletta Edouard M.D.   On: 10/23/2016 10:42   Ir Fluoro Guide Port Insertion Right  Result Date: 10/23/2016 CLINICAL DATA:  Progressive chronic lymphocytic leukemia and need for porta cath for chemotherapy. EXAM: IMPLANTED PORT A CATH PLACEMENT WITH ULTRASOUND AND FLUOROSCOPIC GUIDANCE ANESTHESIA/SEDATION: 3.0 mg IV Versed; 100 mcg IV Fentanyl Total Moderate Sedation Time:  37 minutes The patient's level of consciousness and physiologic status were continuously monitored during the procedure by Radiology nursing. Additional Medications: 2 g IV Ancef. As antibiotic prophylaxis, Ancef was ordered pre-procedure and administered intravenously within one hour of incision. FLUOROSCOPY TIME:  24 seconds.  2.4 mGy. PROCEDURE: The procedure, risks, benefits, and alternatives were explained to the patient. Questions regarding the procedure were encouraged and answered. The patient understands and consents to the procedure. A time-out was performed prior to initiating the procedure. Ultrasound was utilized to confirm patency of the right internal jugular vein. The right neck and chest were prepped with chlorhexidine in a sterile fashion, and a sterile drape was applied covering the operative field. Maximum barrier sterile  technique with sterile gowns and gloves were used for the procedure. Local anesthesia was provided with 1% lidocaine. After creating a small venotomy incision, a 21 gauge needle was advanced into the right internal jugular vein under direct, real-time ultrasound guidance. Ultrasound image documentation was performed. After securing guidewire access, an 8 Fr dilator was placed. A J-wire was kinked to measure appropriate catheter length. A subcutaneous port pocket was then created along the upper chest wall utilizing sharp and blunt dissection. Portable cautery was utilized. The pocket was irrigated with sterile saline. A single lumen power injectable port was chosen for placement. The 8 Fr catheter was tunneled from the port pocket site to the venotomy incision. The port was placed in  the pocket. External catheter was trimmed to appropriate length based on guidewire measurement. At the venotomy, an 8 Fr peel-away sheath was placed over a guidewire. The catheter was then placed through the sheath and the sheath removed. Final catheter positioning was confirmed and documented with a fluoroscopic spot image. The port was accessed with a needle and aspirated and flushed with heparinized saline. The access needle was removed. The venotomy and port pocket incisions were closed with subcutaneous 3-0 Monocryl and subcuticular 4-0 Vicryl. Dermabond was applied to both incisions. COMPLICATIONS: COMPLICATIONS None FINDINGS: After catheter placement, the tip lies at the cavo-atrial junction. The catheter aspirates normally and is ready for immediate use. IMPRESSION: Placement of single lumen port a cath via right internal jugular vein. The catheter tip lies at the cavo-atrial junction. A power injectable port a cath was placed and is ready for immediate use. Electronically Signed   By: Aletta Edouard M.D.   On: 10/23/2016 10:42    ASSESSMENT & PLAN:  CLL (chronic lymphocytic leukemia) ([place]) We discussed the role of  chemotherapy is of curative intent  The decision was made based on publication in the Blood: Randomized trial of bendamustine-rituximab or R-CHOP/R-CVP in first-line treatment of indolent NHL or MCL: the BRIGHT study.  [place] [address] Lucianne Lei der [address] [place] BS, [address] M, Kwan YL, Simpson D, Craig M, Kolibaba K, Issa S, [place], [place] DM, Munteanu M, Aram Beecham JM SO  Blood. 2014;123(19):2944.  The chemotherapy consists of   1. Bendamustine at 90 mg/m2 on day 1 & 2 2. Rituximab at 375 mg/m2 on day 1 Each cycle + 28 days  Plan for 6 cycles total with or without Neulasta support  In the international phase III BRIGHT trial, 447 previously untreated patients with advanced stage follicular (n = 469 patients), mantle cell (n = 74 patients), or other indolent lymphoma were randomly assigned to six cycles of BR according to the same dose and schedule described above or to R-CHOP or R-CVP. BR resulted in similar complete (31 versus 25 percent) and overall (97 versus 91 percent) response rates. BR was associated with higher rates of vomiting and drug hypersensitivity and lower rates of peripheral neuropathy/paresthesia and alopecia. The use of prophylactic antiemetics was not specified in the protocol and was more common among patients assigned to R-CHOP.   We discussed some of the risks, benefits and side-effects of Rituximab with Bendamustine.   Some of the short term side-effects included, though not limited to, risk of fatigue, weight loss, tumor lysis syndrome, risk of allergic reactions, pancytopenia, life-threatening infections, need for transfusions of blood products, nausea, vomiting, change in bowel habits, admission to hospital for various reasons, and risks of death.   Long term side-effects are also discussed including permanent damage to nerve function, chronic fatigue, and rare secondary malignancy including bone marrow disorders.   The patient is  aware that the response rates discussed earlier is not guaranteed.    After a long discussion, patient made an informed decision to proceed with the prescribed plan of care.   Patient education material was dispensed Due to her advanced age and frail status, I plan to reduce the dose of bendamustine to 60 mg/m With extreme leukocytosis, I do not recommend giving her prophylactic Neulasta We will give her G-CSF if needed Due to high burden of disease, I expect high risk of infusion reaction I will premedicate her with dexamethasone daily for 3 days along with  starting her on allopurinol to reduce the risk of tumor lysis syndrome I have also discontinue her diuretic therapy  and substitute treatment with amlodipine for hypertension    Chronic kidney disease, stage III (moderate) She has baseline reduced kidney function I recommend aggressive hydration and discontinuation of diuretic therapy in a short-term  HTN (hypertension) She has significant hypertension There is also a component of anxiety As above, I will substitute diuretic with amlodipine  Goals of care, counseling/discussion The patient is aware she has incurable disease and treatment is palliative but with high rate of remission status We discussed importance of Advanced Directives and Living will. Her daughter is her dedicated healthcare power of attorney    Orders Placed This Encounter  Procedures  . CBC with Differential    Standing Status:   Standing    Number of Occurrences:   20    Standing Expiration Date:   10/28/2017  . Comprehensive metabolic panel    Standing Status:   Standing    Number of Occurrences:   20    Standing Expiration Date:   10/28/2017   All questions were answered. The patient knows to call the clinic with any problems, questions or concerns. No barriers to learning was detected. I spent 25 minutes counseling the patient face to face. The total time spent in the appointment was 40 minutes and  more than 50% was on counseling and review of test results     Heath Lark, MD 10/28/2016 7:24 AM

## 2016-10-28 NOTE — Assessment & Plan Note (Signed)
We discussed the role of chemotherapy is of curative intent  The decision was made based on publication in the Blood: Randomized trial of bendamustine-rituximab or R-CHOP/R-CVP in first-line treatment of indolent NHL or MCL: the BRIGHT study.  AU [address] Lucianne Lei der [address] Pierron BS, [address] M, Kwan YL, Simpson D, Craig M, Kolibaba K, Issa S, [place], Branch DM, Munteanu M, Aram Beecham JM SO  Blood. 2014;123(19):[id number].  The chemotherapy consists of   1. Bendamustine at 90 mg/m2 on day 1 & 2 2. Rituximab at 375 mg/m2 on day 1 Each cycle + 28 days  Plan for 6 cycles total with or without Neulasta support  In the international phase III BRIGHT trial, 447 previously untreated patients with advanced stage follicular (n = 076 patients), mantle cell (n = 74 patients), or other indolent lymphoma were randomly assigned to six cycles of BR according to the same dose and schedule described above or to R-CHOP or R-CVP. BR resulted in similar complete (31 versus 25 percent) and overall (97 versus 91 percent) response rates. BR was associated with higher rates of vomiting and drug hypersensitivity and lower rates of peripheral neuropathy/paresthesia and alopecia. The use of prophylactic antiemetics was not specified in the protocol and was more common among patients assigned to R-CHOP.   We discussed some of the risks, benefits and side-effects of Rituximab with Bendamustine.   Some of the short term side-effects included, though not limited to, risk of fatigue, weight loss, tumor lysis syndrome, risk of allergic reactions, pancytopenia, life-threatening infections, need for transfusions of blood products, nausea, vomiting, change in bowel habits, admission to hospital for various reasons, and risks of death.   Long term side-effects are also discussed including permanent damage to nerve function, chronic fatigue, and rare secondary malignancy including bone marrow  disorders.   The patient is aware that the response rates discussed earlier is not guaranteed.    After a long discussion, patient made an informed decision to proceed with the prescribed plan of care.   Patient education material was dispensed Due to her advanced age and frail status, I plan to reduce the dose of bendamustine to 60 mg/m With extreme leukocytosis, I do not recommend giving her prophylactic Neulasta We will give her G-CSF if needed Due to high burden of disease, I expect high risk of infusion reaction I will premedicate her with dexamethasone daily for 3 days along with starting her on allopurinol to reduce the risk of tumor lysis syndrome I have also discontinue her diuretic therapy  and substitute treatment with amlodipine for hypertension

## 2016-10-29 ENCOUNTER — Emergency Department (HOSPITAL_COMMUNITY): Payer: Medicare Other

## 2016-10-29 ENCOUNTER — Encounter (HOSPITAL_COMMUNITY): Payer: Self-pay | Admitting: Nurse Practitioner

## 2016-10-29 ENCOUNTER — Ambulatory Visit (HOSPITAL_BASED_OUTPATIENT_CLINIC_OR_DEPARTMENT_OTHER): Payer: Medicare Other

## 2016-10-29 ENCOUNTER — Encounter: Payer: Self-pay | Admitting: Hematology and Oncology

## 2016-10-29 ENCOUNTER — Other Ambulatory Visit: Payer: Self-pay

## 2016-10-29 ENCOUNTER — Inpatient Hospital Stay (HOSPITAL_COMMUNITY)
Admission: EM | Admit: 2016-10-29 | Discharge: 2016-10-30 | DRG: 915 | Disposition: A | Discharge status: Home / Self Care | Payer: Medicare Other | Attending: Family Medicine | Admitting: Family Medicine

## 2016-10-29 ENCOUNTER — Encounter: Payer: Self-pay | Admitting: General Practice

## 2016-10-29 VITALS — BP 159/69 | HR 68 | Temp 98.8°F | Resp 18

## 2016-10-29 DIAGNOSIS — I5033 Acute on chronic diastolic (congestive) heart failure: Secondary | ICD-10-CM | POA: Diagnosis present

## 2016-10-29 DIAGNOSIS — Y848 Other medical procedures as the cause of abnormal reaction of the patient, or of later complication, without mention of misadventure at the time of the procedure: Secondary | ICD-10-CM | POA: Diagnosis not present

## 2016-10-29 DIAGNOSIS — R55 Syncope and collapse: Secondary | ICD-10-CM | POA: Diagnosis not present

## 2016-10-29 DIAGNOSIS — J449 Chronic obstructive pulmonary disease, unspecified: Secondary | ICD-10-CM | POA: Diagnosis present

## 2016-10-29 DIAGNOSIS — Z87891 Personal history of nicotine dependence: Secondary | ICD-10-CM | POA: Diagnosis not present

## 2016-10-29 DIAGNOSIS — I2510 Atherosclerotic heart disease of native coronary artery without angina pectoris: Secondary | ICD-10-CM | POA: Diagnosis present

## 2016-10-29 DIAGNOSIS — E785 Hyperlipidemia, unspecified: Secondary | ICD-10-CM | POA: Diagnosis not present

## 2016-10-29 DIAGNOSIS — Z5112 Encounter for antineoplastic immunotherapy: Secondary | ICD-10-CM | POA: Diagnosis not present

## 2016-10-29 DIAGNOSIS — I130 Hypertensive heart and chronic kidney disease with heart failure and stage 1 through stage 4 chronic kidney disease, or unspecified chronic kidney disease: Secondary | ICD-10-CM | POA: Diagnosis not present

## 2016-10-29 DIAGNOSIS — N183 Chronic kidney disease, stage 3 (moderate): Secondary | ICD-10-CM | POA: Diagnosis present

## 2016-10-29 DIAGNOSIS — C9110 Chronic lymphocytic leukemia of B-cell type not having achieved remission: Secondary | ICD-10-CM | POA: Diagnosis not present

## 2016-10-29 DIAGNOSIS — E871 Hypo-osmolality and hyponatremia: Secondary | ICD-10-CM | POA: Diagnosis not present

## 2016-10-29 DIAGNOSIS — T886XXA Anaphylactic reaction due to adverse effect of correct drug or medicament properly administered, initial encounter: Principal | ICD-10-CM | POA: Diagnosis present

## 2016-10-29 DIAGNOSIS — T782XXA Anaphylactic shock, unspecified, initial encounter: Secondary | ICD-10-CM | POA: Diagnosis not present

## 2016-10-29 DIAGNOSIS — Y92538 Other ambulatory health services establishments as the place of occurrence of the external cause: Secondary | ICD-10-CM | POA: Diagnosis not present

## 2016-10-29 DIAGNOSIS — T451X5A Adverse effect of antineoplastic and immunosuppressive drugs, initial encounter: Secondary | ICD-10-CM | POA: Diagnosis not present

## 2016-10-29 DIAGNOSIS — Z7982 Long term (current) use of aspirin: Secondary | ICD-10-CM

## 2016-10-29 DIAGNOSIS — Z7952 Long term (current) use of systemic steroids: Secondary | ICD-10-CM

## 2016-10-29 DIAGNOSIS — D708 Other neutropenia: Secondary | ICD-10-CM | POA: Diagnosis not present

## 2016-10-29 DIAGNOSIS — Z79899 Other long term (current) drug therapy: Secondary | ICD-10-CM | POA: Diagnosis not present

## 2016-10-29 DIAGNOSIS — C8300 Small cell B-cell lymphoma, unspecified site: Secondary | ICD-10-CM

## 2016-10-29 DIAGNOSIS — I10 Essential (primary) hypertension: Secondary | ICD-10-CM | POA: Diagnosis present

## 2016-10-29 LAB — CBC WITH DIFFERENTIAL/PLATELET
BAND NEUTROPHILS: 0 %
BASOS ABS: 0 10*3/uL (ref 0.0–0.1)
BASOS PCT: 0 %
Blasts: 0 %
EOS ABS: 0 10*3/uL (ref 0.0–0.7)
Eosinophils Relative: 0 %
HEMATOCRIT: 33.5 % — AB (ref 36.0–46.0)
Hemoglobin: 11.3 g/dL — ABNORMAL LOW (ref 12.0–15.0)
LYMPHS PCT: 98 %
Lymphs Abs: 32.6 10*3/uL — ABNORMAL HIGH (ref 0.7–4.0)
MCH: 30 pg (ref 26.0–34.0)
MCHC: 33.7 g/dL (ref 30.0–36.0)
MCV: 88.9 fL (ref 78.0–100.0)
MONO ABS: 0.3 10*3/uL (ref 0.1–1.0)
MYELOCYTES: 0 %
Metamyelocytes Relative: 0 %
Monocytes Relative: 1 %
NEUTROS ABS: 0.3 10*3/uL — AB (ref 1.7–7.7)
Neutrophils Relative %: 1 %
PROMYELOCYTES ABS: 0 %
Platelets: 185 10*3/uL (ref 150–400)
RBC: 3.77 MIL/uL — ABNORMAL LOW (ref 3.87–5.11)
RDW: 16 % — AB (ref 11.5–15.5)
WBC: 33.2 10*3/uL — ABNORMAL HIGH (ref 4.0–10.5)
nRBC: 0 /100 WBC

## 2016-10-29 LAB — BASIC METABOLIC PANEL
Anion gap: 11 (ref 5–15)
BUN: 33 mg/dL — ABNORMAL HIGH (ref 6–20)
CALCIUM: 8.7 mg/dL — AB (ref 8.9–10.3)
CO2: 22 mmol/L (ref 22–32)
CREATININE: 1.1 mg/dL — AB (ref 0.44–1.00)
Chloride: 97 mmol/L — ABNORMAL LOW (ref 101–111)
GFR calc Af Amer: 50 mL/min — ABNORMAL LOW (ref >60)
GFR calc non Af Amer: 44 mL/min — ABNORMAL LOW (ref >60)
GLUCOSE: 132 mg/dL — AB (ref 65–99)
Potassium: 4.7 mmol/L (ref 3.5–5.1)
Sodium: 130 mmol/L — ABNORMAL LOW (ref 135–145)

## 2016-10-29 LAB — MAGNESIUM: MAGNESIUM: 1.7 mg/dL (ref 1.7–2.4)

## 2016-10-29 LAB — I-STAT TROPONIN, ED: TROPONIN I, POC: 0 ng/mL (ref 0.00–0.08)

## 2016-10-29 LAB — TROPONIN I
Troponin I: <0.03 ng/mL (ref <0.03)
Troponin I: <0.03 ng/mL (ref <0.03)

## 2016-10-29 LAB — PATHOLOGIST SMEAR REVIEW

## 2016-10-29 MED ORDER — LIDOCAINE-PRILOCAINE 2.5-2.5 % EX CREA
1.0000 "application " | TOPICAL_CREAM | CUTANEOUS | Status: DC | PRN
  Filled 2016-10-29: qty 5

## 2016-10-29 MED ORDER — ENOXAPARIN SODIUM 30 MG/0.3ML ~~LOC~~ SOLN
30.0000 mg | SUBCUTANEOUS | Status: DC
  Administered 2016-10-29: 30 mg via SUBCUTANEOUS
  Filled 2016-10-29: qty 0.3

## 2016-10-29 MED ORDER — DIPHENHYDRAMINE HCL 50 MG/ML IJ SOLN
12.5000 mg | Freq: Four times a day (QID) | INTRAMUSCULAR | Status: AC
  Administered 2016-10-29 – 2016-10-30 (×4): 12.5 mg via INTRAVENOUS
  Filled 2016-10-29 (×4): qty 1

## 2016-10-29 MED ORDER — ONDANSETRON HCL 4 MG PO TABS: 8.0000 mg | ORAL_TABLET | Freq: Three times a day (TID) | ORAL | Status: DC | PRN

## 2016-10-29 MED ORDER — IPRATROPIUM-ALBUTEROL 0.5-2.5 (3) MG/3ML IN SOLN
3.0000 mL | Freq: Four times a day (QID) | RESPIRATORY_TRACT | Status: DC | PRN
  Administered 2016-10-30 (×2): 3 mL via RESPIRATORY_TRACT
  Filled 2016-10-29 (×2): qty 3

## 2016-10-29 MED ORDER — SODIUM CHLORIDE 0.9% FLUSH
10.0000 mL | INTRAVENOUS | Status: DC | PRN
  Filled 2016-10-29: qty 10

## 2016-10-29 MED ORDER — QUETIAPINE FUMARATE 25 MG PO TABS
12.5000 mg | ORAL_TABLET | Freq: Every day | ORAL | Status: DC
  Administered 2016-10-29: 12.5 mg via ORAL
  Filled 2016-10-29: qty 1

## 2016-10-29 MED ORDER — TRAMADOL HCL 50 MG PO TABS
50.0000 mg | ORAL_TABLET | Freq: Four times a day (QID) | ORAL | Status: DC | PRN
  Administered 2016-10-29: 50 mg via ORAL
  Filled 2016-10-29: qty 1

## 2016-10-29 MED ORDER — MAGNESIUM SULFATE 2 GM/50ML IV SOLN
2.0000 g | Freq: Once | INTRAVENOUS | Status: AC
  Administered 2016-10-29: 2 g via INTRAVENOUS
  Filled 2016-10-29: qty 50

## 2016-10-29 MED ORDER — SODIUM CHLORIDE 0.9% FLUSH
10.0000 mL | INTRAVENOUS | Status: DC | PRN
  Administered 2016-10-30: 10 mL
  Filled 2016-10-29: qty 40

## 2016-10-29 MED ORDER — FENTANYL CITRATE (PF) 100 MCG/2ML IJ SOLN
50.0000 ug | Freq: Once | INTRAMUSCULAR | Status: AC
  Administered 2016-10-29: 50 ug via INTRAVENOUS
  Filled 2016-10-29: qty 2

## 2016-10-29 MED ORDER — ISOSORBIDE MONONITRATE ER 30 MG PO TB24
30.0000 mg | ORAL_TABLET | Freq: Every day | ORAL | Status: DC
  Administered 2016-10-30: 30 mg via ORAL
  Filled 2016-10-29: qty 1

## 2016-10-29 MED ORDER — ALLOPURINOL 300 MG PO TABS: 300.0000 mg | ORAL_TABLET | Freq: Every day | ORAL | Status: DC

## 2016-10-29 MED ORDER — SODIUM CHLORIDE 0.9 % IV SOLN
Freq: Once | INTRAVENOUS | Status: AC
  Administered 2016-10-29: 08:00:00 via INTRAVENOUS

## 2016-10-29 MED ORDER — EPINEPHRINE PF 1 MG/ML IJ SOLN
1.0000 mg | Freq: Once | INTRAMUSCULAR | Status: AC
  Administered 2016-10-29: 1 mg via INTRAMUSCULAR

## 2016-10-29 MED ORDER — PALONOSETRON HCL INJECTION 0.25 MG/5ML: 0.2500 mg | Freq: Once | INTRAVENOUS | Status: DC

## 2016-10-29 MED ORDER — FAMOTIDINE IN NACL 20-0.9 MG/50ML-% IV SOLN
20.0000 mg | Freq: Every day | INTRAVENOUS | Status: DC
  Administered 2016-10-29: 20 mg via INTRAVENOUS
  Filled 2016-10-29 (×2): qty 50

## 2016-10-29 MED ORDER — HEPARIN SOD (PORK) LOCK FLUSH 100 UNIT/ML IV SOLN
500.0000 [IU] | Freq: Once | INTRAVENOUS | Status: DC | PRN
  Filled 2016-10-29: qty 5

## 2016-10-29 MED ORDER — DIPHENHYDRAMINE HCL 25 MG PO CAPS
ORAL_CAPSULE | ORAL | Status: AC
  Filled 2016-10-29: qty 2

## 2016-10-29 MED ORDER — ACETAMINOPHEN 325 MG PO TABS
650.0000 mg | ORAL_TABLET | Freq: Once | ORAL | Status: AC
  Administered 2016-10-29: 650 mg via ORAL

## 2016-10-29 MED ORDER — ACETAMINOPHEN 325 MG PO TABS
ORAL_TABLET | ORAL | Status: AC
  Filled 2016-10-29: qty 2

## 2016-10-29 MED ORDER — SODIUM CHLORIDE 0.9 % IV SOLN
60.0000 mg/m2 | Freq: Once | INTRAVENOUS | Status: DC
  Filled 2016-10-29: qty 4

## 2016-10-29 MED ORDER — ACETAMINOPHEN 325 MG PO TABS
650.0000 mg | ORAL_TABLET | Freq: Four times a day (QID) | ORAL | Status: DC | PRN
  Administered 2016-10-29 – 2016-10-30 (×2): 650 mg via ORAL
  Filled 2016-10-29 (×2): qty 2

## 2016-10-29 MED ORDER — SODIUM CHLORIDE 0.9 % IV SOLN
INTRAVENOUS | Status: DC
  Administered 2016-10-29 – 2016-10-30 (×2): via INTRAVENOUS

## 2016-10-29 MED ORDER — DIPHENHYDRAMINE HCL 25 MG PO CAPS
50.0000 mg | ORAL_CAPSULE | Freq: Once | ORAL | Status: AC
  Administered 2016-10-29: 50 mg via ORAL

## 2016-10-29 MED ORDER — DEXAMETHASONE SODIUM PHOSPHATE 10 MG/ML IJ SOLN: 10.0000 mg | Freq: Once | INTRAMUSCULAR | Status: DC

## 2016-10-29 MED ORDER — MONTELUKAST SODIUM 10 MG PO TABS
10.0000 mg | ORAL_TABLET | Freq: Every day | ORAL | Status: DC
  Administered 2016-10-29: 10 mg via ORAL
  Filled 2016-10-29: qty 1

## 2016-10-29 MED ORDER — FAMOTIDINE IN NACL 20-0.9 MG/50ML-% IV SOLN
20.0000 mg | INTRAVENOUS | Status: AC
  Administered 2016-10-29: 20 mg via INTRAVENOUS
  Filled 2016-10-29: qty 50

## 2016-10-29 MED ORDER — PREDNISONE 50 MG PO TABS
60.0000 mg | ORAL_TABLET | Freq: Four times a day (QID) | ORAL | Status: AC
  Administered 2016-10-29 – 2016-10-30 (×4): 60 mg via ORAL
  Filled 2016-10-29: qty 1
  Filled 2016-10-29: qty 3
  Filled 2016-10-29 (×2): qty 1

## 2016-10-29 MED ORDER — PRAVASTATIN SODIUM 40 MG PO TABS
40.0000 mg | ORAL_TABLET | Freq: Every day | ORAL | Status: DC
  Administered 2016-10-29: 40 mg via ORAL
  Filled 2016-10-29: qty 1

## 2016-10-29 MED ORDER — SODIUM CHLORIDE 0.9 % IV SOLN
375.0000 mg/m2 | Freq: Once | INTRAVENOUS | Status: AC
  Administered 2016-10-29: 600 mg via INTRAVENOUS
  Filled 2016-10-29: qty 50

## 2016-10-29 MED ORDER — ASPIRIN EC 81 MG PO TBEC
81.0000 mg | DELAYED_RELEASE_TABLET | Freq: Every day | ORAL | Status: DC
  Administered 2016-10-30: 81 mg via ORAL
  Filled 2016-10-29: qty 1

## 2016-10-29 MED ORDER — METHYLPREDNISOLONE SODIUM SUCC 125 MG IJ SOLR
125.0000 mg | Freq: Once | INTRAMUSCULAR | Status: AC
  Administered 2016-10-29: 125 mg via INTRAVENOUS

## 2016-10-29 MED ORDER — ALLOPURINOL 300 MG PO TABS
300.0000 mg | ORAL_TABLET | Freq: Every day | ORAL | Status: DC
  Administered 2016-10-30: 300 mg via ORAL
  Filled 2016-10-29: qty 1

## 2016-10-29 MED ORDER — SODIUM CHLORIDE 0.9% FLUSH
3.0000 mL | Freq: Two times a day (BID) | INTRAVENOUS | Status: DC
  Administered 2016-10-29: 3 mL via INTRAVENOUS

## 2016-10-29 NOTE — Progress Notes (Signed)
Pt 30 minutes into 1st rituxan infusion.  Magdalene River, RN obtained vital signs per infusion protocol.  BP noted to be 63/34. Pt stated she felt nauseous and lightheaded.  Infusion stopped.  NS running wide open. Oxygen applied.  At that time pt noted to be unresponsive.  Dr. Alvy Bimler called to bedside.  Pt had lost pulse at MD arrival.  1 round compressions completed by [employer].  Dr. Alvy Bimler able to appreciate heart beat.  Epi pen administered via right thigh.  125 solumedrol given.   No benadryl given due to 66m given prior to start of infusion. Code team arrived.  Pt responding at this time.  Pt transported from chair to stretcher and brought to ED by code team with daughter, [place] bedside.  Remainder of rituxan disposed of via black bin.

## 2016-10-29 NOTE — ED Notes (Signed)
Bed: RESA Expected date:  Expected time:  Means of arrival:  Comments: Cancer center

## 2016-10-29 NOTE — Addendum Note (Signed)
Addended by: Arty Baumgartner on: 10/29/2016 03:20 PM   Modules accepted: Orders

## 2016-10-29 NOTE — Progress Notes (Signed)
[facility]ual Care Note  Responded to code in infusion, providing pastoral presence and support to Ms Jolinda Croak daughter [name] while pt was receiving urgent attention.  [name] was reassured to have her mother ask for her, and was primarily focused on how to support her mom from here, as pt was apparently very apprehensive about tx to begin with.  Addressing questions and concerns with Dr Alvy Bimler helped [name] feel further at ease.  She values emotional support (her daughter, Ms Jolinda Croak granddaughter) is a Social worker.  Referred to [facility] chaplains for f/u support. I plan to f/u with Ms Asencion Islam and [name] by phone within the next week, and [name] is aware of ongoing [place] availability, as well.   Emma, [place], [facility] Pager [phone] Voicemail [phone]

## 2016-10-29 NOTE — H&P (Addendum)
History and Physical    [name] QZE:[id number] DOB: November 05, 1926 DOA: 10/29/2016  PCP: Lujean Amel, MD  Patient coming from: Home,.   I have personally briefly reviewed patient's old medical records in [place]  Chief Complaint: lost of consciousness.   HPI: Carla Conway is a 83 y.o. female with medical history significant of  CLL, HTN, CKD stage III, patient was at cancer center today for Rituximab infusion. After infusion started, patient   complain of abdominal discomfort, follow by shaking movement, and subsequently lost consciousness. She was hypotensive. Code blue was called and chest compression was started, patient never lost pulse, so she didn't received prolong CPR measure. She was NRB and bag for few minutes. She was given IV solumedrol and epinephrine. She received benadryl prior to started infusion.  Patient is complaining of chest pain, middle of chest were she received few compression. She denies chest pain or palpitation prior to episode.   Of notes patient has been reacting to rituximan and has been getting premedication with prednisone and benadryl.   ED Course: Patient is alert, BP 148/56, HR 71, WBC 33, Hb 11, Platelet 185.   Review of Systems: As per HPI otherwise 10 point review of systems negative.    Past Medical History:  Diagnosis Date  . Asthma   . CAD (coronary artery disease)   . Hyperlipidemia   . Hypertension     Past Surgical History:  Procedure Laterality Date  . CATARACT EXTRACTION    . CORONARY ANGIOPLASTY  10 -15 years ago   stent in [place] ,[place]  . IR FLUORO GUIDE PORT INSERTION RIGHT  10/23/2016  . IR US GUIDE VASC ACCESS RIGHT  10/23/2016     reports that she has quit smoking. Her smoking use included Cigarettes. She started smoking about 33 years ago. She has never used smokeless tobacco. She reports that she does not drink alcohol or use drugs.  Allergies  Allergen Reactions  . Rituximab Other (See Comments)    Upset  stomach and blood pressure dropped    Family History  Problem Relation Age of Onset  . Cancer Father        colon     Prior to Admission medications   Medication Sig Start Date End Date Taking? Authorizing Provider  acetaminophen (TYLENOL) 325 MG tablet Take 650 mg by mouth every 6 (six) hours as needed for mild pain.   Yes [provider]  allopurinol (ZYLOPRIM) 300 MG tablet TAKE 1 TABLET(300 MG) BY MOUTH DAILY 10/27/16  Yes Gorsuch, Ni, MD  amLODipine (NORVASC) 10 MG tablet TAKE 1 TABLET(10 MG) BY MOUTH DAILY 10/27/16  Yes Alvy Bimler, Ni, MD  aspirin EC 81 MG tablet Take 81 mg by mouth daily.   Yes [provider]  COMBIVENT RESPIMAT 20-100 MCG/ACT AERS respimat Take 2 puffs by mouth every 6 (six) hours as needed for wheezing or shortness of breath.  04/16/13  Yes [provider]  irbesartan (AVAPRO) 300 MG tablet Take 300 mg by mouth daily.  05/09/13  Yes [provider]  isosorbide mononitrate (IMDUR) 30 MG 24 hr tablet Take 30 mg by mouth daily.  05/09/13  Yes [provider]  lidocaine-prilocaine (EMLA) cream Apply 1 application topically as needed. Patient taking differently: Apply 1 application topically as needed (For port-a-cath.).  10/27/16  Yes Gorsuch, Ni, MD  lovastatin (MEVACOR) 40 MG tablet Take 40 mg by mouth at bedtime.  05/09/13  Yes [provider]  montelukast (SINGULAIR)  10 MG tablet Take 10 mg by mouth at bedtime.  05/09/13  Yes [provider]  ondansetron (ZOFRAN) 8 MG tablet Take 1 tablet (8 mg total) by mouth every 8 (eight) hours as needed for nausea. 10/27/16  Yes Gorsuch, Ni, MD  potassium chloride (K-DUR) 10 MEQ tablet Take 10 mEq by mouth daily. 03/24/16  Yes [provider]  predniSONE (DELTASONE) 50 MG tablet Take 1 tablet (50 mg total) by mouth daily with breakfast. 10/27/16 10/30/16 Yes Gorsuch, Ni, MD  promethazine (PHENERGAN) 12.5 MG tablet Take 1 tablet (12.5 mg total) by mouth every 6 (six) hours  as needed for nausea. 10/27/16  Yes Gorsuch, Ni, MD  QUEtiapine (SEROQUEL) 25 MG tablet Take 12.5 mg by mouth at bedtime.    Yes [provider]    Physical Exam: Vitals:   10/29/16 1100 10/29/16 1200 10/29/16 1300 10/29/16 1509  BP: 140/62 (!) 125/58 (!) 131/58 (!) 148/56  Pulse: 69 70 69 71  Resp: 18 19 20 18   Temp:    98.2 F (36.8 C)  TempSrc:    Oral  SpO2: 94% 93% 94% 95%  Weight:      Height:        Constitutional: NAD, calm, comfortable Vitals:   10/29/16 1100 10/29/16 1200 10/29/16 1300 10/29/16 1509  BP: 140/62 (!) 125/58 (!) 131/58 (!) 148/56  Pulse: 69 70 69 71  Resp: 18 19 20 18   Temp:    98.2 F (36.8 C)  TempSrc:    Oral  SpO2: 94% 93% 94% 95%  Weight:      Height:       Eyes: PERRL, lids and conjunctivae normal ENMT: Mucous membranes are moist. Posterior pharynx clear of any exudate or lesions.Normal dentition.  Neck: normal, supple, no masses, no thyromegaly Respiratory: clear to auscultation bilaterally, no wheezing, no crackles. Normal respiratory effort. No accessory muscle use. Chest; port cath in place, mild ecchymosis around port,  Cardiovascular: Regular rate and rhythm, no murmurs / rubs / gallops. No extremity edema. 2+ pedal pulses. No carotid bruits.  Abdomen: no tenderness, no masses palpated. No hepatosplenomegaly. Bowel sounds positive.  Musculoskeletal: no clubbing / cyanosis. No joint deformity upper and lower extremities. Good ROM, no contractures. Normal muscle tone.  Skin: no rashes, lesions, ulcers. No induration Neurologic: CN 2-12 grossly intact. Sensation intact, DTR normal. Strength 5/5 in all 4.  Psychiatric: Normal judgment and insight. Alert and oriented x 3. Normal mood.   )  Labs on Admission: I have personally reviewed following labs and imaging studies  CBC:  Recent Labs Lab 10/23/16 0744 10/29/16 0955  WBC 105.3* 33.2*  NEUTROABS  --  0.3*  HGB 11.8* 11.3*  HCT 35.2* 33.5*  MCV 88.9 88.9  PLT 221 094    Basic Metabolic Panel:  Recent Labs Lab 10/23/16 0744 10/29/16 0955 10/29/16 1248  NA 134* 130*  --   K 4.4 4.7  --   CL 99* 97*  --   CO2 26 22  --   GLUCOSE 103* 132*  --   BUN 25* 33*  --   CREATININE 1.18* 1.10*  --   CALCIUM 9.3 8.7*  --   MG  --   --  1.7   GFR: Estimated Creatinine Clearance: 32.1 mL/min (A) (by C-G formula based on SCr of 1.1 mg/dL (H)). Liver Function Tests:  Recent Labs Lab 10/23/16 0744  AST 24  ALT 15  ALKPHOS 84  BILITOT 0.3  PROT 6.7  ALBUMIN 4.1  No results for input(s): LIPASE, AMYLASE in the last 168 hours. No results for input(s): AMMONIA in the last 168 hours. Coagulation Profile:  Recent Labs Lab 10/23/16 0744  INR 0.97   Cardiac Enzymes:  Recent Labs Lab 10/29/16 1248  TROPONINI <0.03   BNP (last 3 results) No results for input(s): PROBNP in the last 8760 hours. HbA1C: No results for input(s): HGBA1C in the last 72 hours. CBG: No results for input(s): GLUCAP in the last 168 hours. Lipid Profile: No results for input(s): CHOL, HDL, LDLCALC, TRIG, CHOLHDL, LDLDIRECT in the last 72 hours. Thyroid Function Tests: No results for input(s): TSH, T4TOTAL, FREET4, T3FREE, THYROIDAB in the last 72 hours. Anemia Panel: No results for input(s): VITAMINB12, FOLATE, FERRITIN, TIBC, IRON, RETICCTPCT in the last 72 hours. Urine analysis:    Component Value Date/Time   COLORURINE YELLOW 05/08/2016 2131   APPEARANCEUR CLEAR 05/08/2016 2131   LABSPEC 1.008 05/08/2016 2131   PHURINE 6.0 05/08/2016 2131   GLUCOSEU NEGATIVE 05/08/2016 2131   HGBUR SMALL (A) 05/08/2016 2131   BILIRUBINUR NEGATIVE 05/08/2016 2131   KETONESUR 5 (A) 05/08/2016 2131   PROTEINUR NEGATIVE 05/08/2016 2131   NITRITE NEGATIVE 05/08/2016 2131   LEUKOCYTESUR NEGATIVE 05/08/2016 2131    Radiological Exams on Admission: Dg Chest Port 1 View  Result Date: 10/29/2016 CLINICAL DATA:  Reduced level of consciousness this morning while receiving  chemotherapy infusion, anaphylactoid reaction to chemotherapy, chest soreness post compressions, history hypertension, asthma, coronary artery disease, COPD, CHF, small lymphocytic lymphoma EXAM: PORTABLE CHEST 1 VIEW COMPARISON:  Portable exam at 1009 hours compared to 05/08/2016 FINDINGS: RIGHT jugular Port-A-Cath with tip projecting over SVC. Normal heart size, mediastinal contours, and pulmonary vascularity. Atherosclerotic calcification and mild tortuosity of thoracic aorta. Bronchitic changes with minimal chronic bibasilar atelectasis. No acute infiltrate, pleural effusion or pneumothorax. Bones demineralized. IMPRESSION: Bronchitic changes with chronic bibasilar atelectasis. No acute abnormalities. Aortic Atherosclerosis (ICD10-I70.0). Electronically Signed   By: Lavonia Dana M.D.   On: 10/29/2016 10:24    EKG: Independently reviewed. Sinus rhythm, short PR.   Assessment/Plan Active Problems:   HTN (hypertension)   CAD in native artery   Syncope   COPD (chronic obstructive pulmonary disease) (HCC)   CLL (chronic lymphocytic leukemia) (HCC)   Anaphylactoid reaction  1-Anaphylactoid Reaction to Rituximab; Admit to telemetry, monitor for 24 hours.  IV benadryl, Q 6 hours.  Prednisone 60 mg Q 6 hours.  Dr Alvy Bimler will follow up on patient.  Check Mg level, replete as needed.  Cycle cardiac enzyme.   2-Chest pain;  Likely MSK post compression.  Due to Syncope, during infusion, check cardiac enzymes and ECHO.   3-Hyponatremia; IV fluids.  4-CLL; per oncologist.  5-Syncope; in setting of anaphylactoid reaction.  Monitor on telemetry, cycle cardiac enzymes. Check ECHO    DVT prophylaxis: Lovenox Code Status: Full code.  Family Communication: Daughter who was at bedside.  Disposition Plan: Home in 24 hours.  Consults called: Dr Alvy Bimler  Admission status: Observation,    Elmarie Shiley MD [facility] Pager [phone]  If 7PM-7AM, please contact  night-coverage [email or web address] Password [facility]  10/29/2016, 5:58 PM

## 2016-10-29 NOTE — Patient Instructions (Signed)
[facility] Discharge Instructions for Patients Receiving Chemotherapy  Today you received the following chemotherapy agents rituxan/bendeka   To help prevent nausea and vomiting after your treatment, we encourage you to take your nausea medication as directed.  DO NOT TAKE ONDANSETRON FOR 3 DAYS  If you develop nausea and vomiting that is not controlled by your nausea medication, call the clinic.   BELOW ARE SYMPTOMS THAT SHOULD BE REPORTED IMMEDIATELY:  *FEVER GREATER THAN 100.5 F  *CHILLS WITH OR WITHOUT FEVER  NAUSEA AND VOMITING THAT IS NOT CONTROLLED WITH YOUR NAUSEA MEDICATION  *UNUSUAL SHORTNESS OF BREATH  *UNUSUAL BRUISING OR BLEEDING  TENDERNESS IN MOUTH AND THROAT WITH OR WITHOUT PRESENCE OF ULCERS  *URINARY PROBLEMS  *BOWEL PROBLEMS  UNUSUAL RASH Items with * indicate a potential emergency and should be followed up as soon as possible.  Feel free to call the clinic you have any questions or concerns. The clinic phone number is [phone].

## 2016-10-29 NOTE — ED Triage Notes (Signed)
Patient was receiving her first dose of chemo at the cancer center and had a reaction to the medication and had a syncopal episode.

## 2016-10-29 NOTE — ED Provider Notes (Signed)
[facility] DEPT Provider Note   CSN: [id number] Arrival date & time: 10/29/16  8832     History   Chief Complaint No chief complaint on file.   HPI Carla Conway is a 83 y.o. female.  83yo F w/ PMH including CLL, dCHF, HTN, COPD, CAD who p/w medication reaction. The patient was in the oncology clinic this morning receiving a dose of rituximab. Her oncologist, Dr. Alvy Bimler, stated that she has been reacting to this medication and is pretreated at home with prednisone 50m the morning of her treatment. In the clinic today, 30 min after infusion started she became hypotensive and altered. A code blue was called. She had a few chest compressions but never lost pulses therefore did not receive prolonged CPR measures. She was placed on NRB and bagged for a few minutes. VS showed hypotension and HR in 50s. She was given Epi, solumedrol, and had already received benadryl prior to start of infusion. She was also given a fluid bolus. In the ED, she complains of central chest pain and daughter confirms that she did have a few compressions. Daughter reports that prior to today, she has not had any recent infectious symptoms including no fevers, cough/cold symptoms, or vomiting. Patient denies any shortness of breath.   The history is provided by the patient and a relative.    Past Medical History:  Diagnosis Date  . Asthma   . CAD (coronary artery disease)   . Hyperlipidemia   . Hypertension     Patient Active Problem List   Diagnosis Date Noted  . Goals of care, counseling/discussion 10/28/2016  . Chronic kidney disease, stage III (moderate) 10/17/2016  . Lymphoma, small lymphocytic ([facility] 10/15/2016  . Neutropenia ([facility] 09/23/2016  . CLL (chronic lymphocytic leukemia) ([facility] 05/11/2016  . Acute on chronic diastolic heart failure ([facility] 05/10/2016  . Influenza A 05/09/2016  . Lymphadenopathy of head and neck region 05/09/2016  . Lymphadenopathy, thoracic 05/09/2016  . COPD (chronic  obstructive pulmonary disease) ([facility] 05/09/2016  . Debility 05/09/2016  . Syncopal episodes 05/09/2016  . Syncope 05/08/2016  . HTN (hypertension) 05/18/2013  . CAD in native artery 05/18/2013    Past Surgical History:  Procedure Laterality Date  . CATARACT EXTRACTION    . CORONARY ANGIOPLASTY  10 -15 years ago   [place] FLU[place] GUIDE PORT INSERTION RIGHT  10/23/2016  . IR [place] ACCESS [place]  10/23/2016    OB History    No data available       Home Medications    Prior to Admission medications   Medication Sig Start Date End Date Taking? Authorizing Provider  allopurinol (ZYLOPRIM) 300 MG tablet TAKE 1 TABLET(300 MG) BY MOUTH DAILY 10/27/16   GAlvy Bimler Ni, MD  amLODipine (NORVASC) 10 MG tablet TAKE 1 TABLET(10 MG) BY MOUTH DAILY 10/27/16   GAlvy Bimler Ni, MD  COMBIVENT RESPIMAT 20-100 MCG/ACT AERS respimat Take 2 puffs by mouth every 6 (six) hours as needed.  04/16/13   [provider]  irbesartan (AVAPRO) 300 MG tablet Take 300 mg by mouth daily.  05/09/13   [provider]  isosorbide mononitrate (IMDUR) 30 MG 24 hr tablet Take 30 mg by mouth daily.  05/09/13   [provider]  lidocaine-prilocaine (EMLA) cream Apply 1 application topically as needed. 10/27/16   GHeath Lark MD  lovastatin (MEVACOR) 40 MG tablet Take 40 mg by mouth every evening.  05/09/13   [provider]  metoprolol succinate (  TOPROL-XL) 100 MG 24 hr tablet Take 100 mg by mouth daily.  05/02/13   [provider]  montelukast (SINGULAIR) 10 MG tablet Take 10 mg by mouth every evening.  05/09/13   [provider]  ondansetron (ZOFRAN) 8 MG tablet Take 1 tablet (8 mg total) by mouth every 8 (eight) hours as needed for nausea. 10/27/16   Heath Lark, MD  potassium chloride (K-DUR) 10 MEQ tablet Take 10 mEq by mouth daily. 03/24/16   [provider]  predniSONE (DELTASONE) 50 MG tablet Take 1 tablet (50 mg total) by mouth daily with  breakfast. 10/27/16 10/30/16  Heath Lark, MD  promethazine (PHENERGAN) 12.5 MG tablet Take 1 tablet (12.5 mg total) by mouth every 6 (six) hours as needed for nausea. 10/27/16   Heath Lark, MD  QUEtiapine (SEROQUEL) 25 MG tablet Take 12.5 mg by mouth daily.     [provider]  triamterene-hydrochlorothiazide (MAXZIDE-25) 37.5-25 MG tablet Take 1 tablet by mouth daily.     [provider]    Family History Family History  Problem Relation Age of Onset  . Cancer Father        colon    Social History Social History  Substance Use Topics  . Smoking status: Former Smoker    Types: Cigarettes    Start date: 05/18/1983  . Smokeless tobacco: Never Used  . Alcohol use No     Allergies   Patient has no known allergies.   Review of Systems Review of Systems All other systems reviewed and are negative except that which was mentioned in HPI   Physical Exam Updated Vital Signs BP (!) 140/118   Pulse 67   Resp 18   Ht 5' 2"  (1.575 m)   Wt 68.5 kg (151 lb)   SpO2 98%   BMI 27.62 kg/m   Physical Exam  Constitutional: She is oriented to person, place, and time. She appears well-developed and well-nourished.  Very anxious, tearful  HENT:  Head: Normocephalic and atraumatic.  Mouth/Throat: Oropharynx is clear and moist.  Moist mucous membranes  Eyes: Pupils are equal, round, and reactive to light. Conjunctivae are normal.  Neck: Neck supple.  Cardiovascular: Normal rate, regular rhythm and normal heart sounds.   No murmur heard. Pulmonary/Chest: Effort normal and breath sounds normal. She exhibits tenderness (central).  Abdominal: Soft. Bowel sounds are normal. She exhibits no distension. There is no tenderness.  Musculoskeletal: She exhibits no edema.  Neurological: She is alert and oriented to person, place, and time.  Fluent speech  Skin: Skin is warm and dry. No rash noted.  Port in R upper chest with surrounding healing ecchymoses  Psychiatric:    anxious  Nursing note and vitals reviewed.    ED Treatments / Results  Labs (all labs ordered are listed, but only abnormal results are displayed) Labs Reviewed  BASIC METABOLIC PANEL - Abnormal; Notable for the following:       Result Value   Sodium 130 (*)    Chloride 97 (*)    Glucose, Bld 132 (*)    BUN 33 (*)    Creatinine, Ser 1.10 (*)    Calcium 8.7 (*)    GFR calc non Af Amer 44 (*)    GFR calc Af Amer 50 (*)    All other components within normal limits  CBC WITH DIFFERENTIAL/PLATELET - Abnormal; Notable for the following:    WBC 33.2 (*)    RBC 3.77 (*)    Hemoglobin 11.3 (*)  HCT 33.5 (*)    RDW 16.0 (*)    Neutro Abs 0.3 (*)    Lymphs Abs 32.6 (*)    All other components within normal limits  PATHOLOGIST SMEAR REVIEW  TROPONIN I  TROPONIN I  MAGNESIUM  TROPONIN I  BASIC METABOLIC PANEL  URIC ACID  LACTATE DEHYDROGENASE  CBC WITH DIFFERENTIAL/PLATELET  I-STAT TROPONIN, ED    EKG  EKG Interpretation None       Radiology Dg Chest Port 1 View  Result Date: 10/29/2016 CLINICAL DATA:  Reduced level of consciousness this morning while receiving chemotherapy infusion, anaphylactoid reaction to chemotherapy, chest soreness post compressions, history hypertension, asthma, coronary artery disease, COPD, CHF, small lymphocytic lymphoma EXAM: PORTABLE CHEST 1 VIEW COMPARISON:  Portable exam at 1009 hours compared to 05/08/2016 FINDINGS: RIGHT jugular Port-A-Cath with tip projecting over SVC. Normal heart size, mediastinal contours, and pulmonary vascularity. Atherosclerotic calcification and mild tortuosity of thoracic aorta. Bronchitic changes with minimal chronic bibasilar atelectasis. No acute infiltrate, pleural effusion or pneumothorax. Bones demineralized. IMPRESSION: Bronchitic changes with chronic bibasilar atelectasis. No acute abnormalities. Aortic Atherosclerosis (ICD10-I70.0). Electronically Signed   By: Lavonia Dana M.D.   On: 10/29/2016 10:24     Procedures Procedures (including critical care time) [facility]  Department of Emergency Medicine   Code Blue CONSULT NOTE  Chief Complaint: Cardiac arrest/unresponsive   Level V Caveat: Unresponsive  History of present illness: I was contacted by the hospital for a CODE BLUE cardiac arrest upstairs and presented to the patient's bedside. Pt being bagged with BVM, decreased LOC, spontaneous respirations. She was hypotensive but had a pulse. Given epi and solumedrol just prior to my arrival. Started fluid bolus, pt eventually transitioned to NRB mask. Assisted with move onto stretcher. Pt's mentation and work of breathing improved and she was transported to ER for further evaluation.    Scheduled Meds: . fentaNYL (SUBLIMAZE) injection  50 mcg Intravenous Once   Continuous Infusions: PRN Meds:. Past Medical History:  Diagnosis Date  . Asthma   . CAD (coronary artery disease)   . Hyperlipidemia   . Hypertension    Past Surgical History:  Procedure Laterality Date  . CATARACT EXTRACTION    . CORONARY ANGIOPLASTY  10 -15 years ago   stent in [place] ,[place]  . IR FLUORO GUIDE PORT INSERTION RIGHT  10/23/2016  . IR US GUIDE VASC ACCESS RIGHT  10/23/2016   Social History   Social History  . Marital status: Married    Spouse name: N/A  . Number of children: 5  . Years of education: N/A   Occupational History  . Not on file.   Social History Main Topics  . Smoking status: Former Smoker    Types: Cigarettes    Start date: 05/18/1983  . Smokeless tobacco: Never Used  . Alcohol use No  . Drug use: No  . Sexual activity: Not on file   Other Topics Concern  . Not on file   Social History Narrative   Lives in retirement home.     No Known Allergies  Last set of Vital Signs (not current) Vitals:   10/29/16 0958 10/29/16 1004  BP: (!) 140/118 (!) 124/58  Pulse: 67   Resp: 18         Procedures    CRITICAL CARE Performed by: Wenda Overland  Kishawn Pickar Total critical care time: 45 Critical care time was exclusive of separately billable procedures and treating other patients. Critical care was necessary to treat  or prevent imminent or life-threatening deterioration. Critical care was time spent personally by me on the following activities: development of treatment plan with patient and/or surrogate as well as nursing, discussions with consultants, evaluation of patient's response to treatment, examination of patient, obtaining history from patient or surrogate, ordering and performing treatments and interventions, ordering and review of laboratory studies, ordering and review of radiographic studies, pulse oximetry and re-evaluation of patient's condition.    Medications Ordered in ED Medications  fentaNYL (SUBLIMAZE) injection 50 mcg (not administered)     Initial Impression / Assessment and Plan / ED Course  I have reviewed the triage vital signs and the nursing notes.  Pertinent labs & imaging results that were available during my care of the patient were reviewed by me and considered in my medical decision making (see chart for details).    PT w/ anaphylactoid reaction to Rituximab in chemotherapy clinic this morning. When I arrived to [place], the patient was being bagged and had a pulse, she was altered but not unconscious. She was hypotensive. Had already received epi, solumedrol, benadryl. She was maintained on NRB and taken to ED where repeat VS showed improved BP, no hypoxia, no rash or oral/facial swelling. She was mentating appropriately. Gave fentanyl, obtained CXR and screening labs.  Dr. Alvy Bimler has recommended observation admission. Labs show Na 130, Cr 1.1, CBC abnormal as expected for known CLL. CXR negative for rib fractures. Discussed admission with [facility], Dr. Tyrell Antonio, and pt admitted for further care.    Final Clinical Impressions(s) / ED Diagnoses   Final diagnoses:  Anaphylactoid reaction, initial  encounter    New Prescriptions New Prescriptions   No medications on file     Kaliana Albino, Wenda Overland, MD 10/29/16 2105

## 2016-10-29 NOTE — Progress Notes (Signed)
I was paged to the infusion room.  The patient has reduced level of consciousness within 30 minutes of rituximab infusion.  She had already received 50 mg of prednisone this morning along with 50 mg of oral Benadryl. On examination, she have reduced consciousness with diffuse wheezing on both sides of her lungs.  The patient is breathing on her own.  I have requested nursing staff to give her 1 dose of epinephrine along with 1 dose of infusion of Solu-Medrol.  We also supported her with high flow oxygen.  The patient subsequently becomes more alert and conscious.  She has never loss of pulse during this event.  She was transported to the emergency room for further evaluation and observation.  Recommend her to be admitted for 24-hour observation with telemetry along with scheduled dose of 12.5 mg Benadryl and prednisone 1 mg/kg of prednisone every 6 hours or so for the next 24 hours before discharge. I will follow while she is in the hospital Her daughter is informed with the plan of care.  I will cancel her chemotherapy tomorrow.

## 2016-10-30 ENCOUNTER — Ambulatory Visit: Payer: Medicare Other

## 2016-10-30 ENCOUNTER — Observation Stay (HOSPITAL_COMMUNITY): Payer: Medicare Other

## 2016-10-30 DIAGNOSIS — C9110 Chronic lymphocytic leukemia of B-cell type not having achieved remission: Secondary | ICD-10-CM | POA: Diagnosis not present

## 2016-10-30 DIAGNOSIS — D708 Other neutropenia: Secondary | ICD-10-CM | POA: Diagnosis not present

## 2016-10-30 DIAGNOSIS — C9190 Lymphoid leukemia, unspecified not having achieved remission: Secondary | ICD-10-CM

## 2016-10-30 DIAGNOSIS — T782XXD Anaphylactic shock, unspecified, subsequent encounter: Secondary | ICD-10-CM

## 2016-10-30 DIAGNOSIS — J431 Panlobular emphysema: Secondary | ICD-10-CM | POA: Diagnosis not present

## 2016-10-30 DIAGNOSIS — I10 Essential (primary) hypertension: Secondary | ICD-10-CM

## 2016-10-30 DIAGNOSIS — T782XXA Anaphylactic shock, unspecified, initial encounter: Secondary | ICD-10-CM | POA: Diagnosis not present

## 2016-10-30 DIAGNOSIS — T886XXA Anaphylactic reaction due to adverse effect of correct drug or medicament properly administered, initial encounter: Secondary | ICD-10-CM | POA: Diagnosis not present

## 2016-10-30 DIAGNOSIS — D630 Anemia in neoplastic disease: Secondary | ICD-10-CM

## 2016-10-30 LAB — BASIC METABOLIC PANEL
Anion gap: 8 (ref 5–15)
BUN: 26 mg/dL — AB (ref 6–20)
CHLORIDE: 102 mmol/L (ref 101–111)
CO2: 25 mmol/L (ref 22–32)
CREATININE: 0.99 mg/dL (ref 0.44–1.00)
Calcium: 8.2 mg/dL — ABNORMAL LOW (ref 8.9–10.3)
GFR calc Af Amer: 57 mL/min — ABNORMAL LOW (ref >60)
GFR calc non Af Amer: 49 mL/min — ABNORMAL LOW (ref >60)
GLUCOSE: 139 mg/dL — AB (ref 65–99)
Potassium: 3.6 mmol/L (ref 3.5–5.1)
Sodium: 135 mmol/L (ref 135–145)

## 2016-10-30 LAB — TROPONIN I: Troponin I: <0.03 ng/mL (ref <0.03)

## 2016-10-30 LAB — CBC WITH DIFFERENTIAL/PLATELET
BASOS ABS: 0 10*3/uL (ref 0.0–0.1)
BASOS PCT: 0 %
EOS PCT: 0 %
Eosinophils Absolute: 0 10*3/uL (ref 0.0–0.7)
HEMATOCRIT: 31.2 % — AB (ref 36.0–46.0)
HEMOGLOBIN: 10.3 g/dL — AB (ref 12.0–15.0)
LYMPHS PCT: 98 %
Lymphs Abs: 91.6 10*3/uL — ABNORMAL HIGH (ref 0.7–4.0)
MCH: 29 pg (ref 26.0–34.0)
MCHC: 33 g/dL (ref 30.0–36.0)
MCV: 87.9 fL (ref 78.0–100.0)
MONOS PCT: 1 %
Monocytes Absolute: 0.9 10*3/uL (ref 0.1–1.0)
NEUTROS ABS: 0.9 10*3/uL — AB (ref 1.7–7.7)
Neutrophils Relative %: 1 %
Platelets: 222 10*3/uL (ref 150–400)
RBC: 3.55 MIL/uL — ABNORMAL LOW (ref 3.87–5.11)
RDW: 16.1 % — ABNORMAL HIGH (ref 11.5–15.5)
WBC: 93.4 10*3/uL (ref 4.0–10.5)

## 2016-10-30 LAB — LACTATE DEHYDROGENASE: LDH: 189 U/L (ref 98–192)

## 2016-10-30 LAB — URIC ACID: Uric Acid, Serum: 4.2 mg/dL (ref 2.3–6.6)

## 2016-10-30 MED ORDER — LOPERAMIDE HCL 2 MG PO CAPS: 4.0000 mg | ORAL_CAPSULE | Freq: Once | ORAL | Status: DC

## 2016-10-30 MED ORDER — HEPARIN SOD (PORK) LOCK FLUSH 100 UNIT/ML IV SOLN
500.0000 [IU] | INTRAVENOUS | Status: AC | PRN
  Administered 2016-10-30: 500 [IU]

## 2016-10-30 NOTE — Progress Notes (Signed)
Patient was requesting her inhaler. PCP was notified. Awaiting new orders.

## 2016-10-30 NOTE — Discharge Summary (Signed)
Physician Discharge Summary  Carla Conway Pra WUJ:[id number] DOB: 03-28-1927 DOA: 10/29/2016  PCP: Lujean Amel, MD  Admit date: 10/29/2016 Discharge date: 10/30/2016  Admitted From: Home Disposition: Home   Recommendations for Outpatient Follow-up:  1. Follow up with PCP and oncology, Dr. Alvy Bimler.  2. Rituximab added to allergies list due to anaphylactic reaction 7/25.   Home Health: None Equipment/Devices: None Discharge Condition: Stable  CODE STATUS: Full Diet recommendation: As tolerated.   Brief/Interim Summary: Carla Conway is an 83 y.o. female with medical history significant of CLL, HTN, CKD stage III who was at cancer center 7/25 for Rituximab infusion. After infusion started, patient complained of abdominal discomfort, followed by shaking movement, and subsequently lost consciousness. She was hypotensive. Code blue was called and chest compression was started. The patient never lost a pulse, so she didn't received prolonged CPR measure. NRB and bagged for a few minutes with improvement in breathing status. She was given IV solumedrol and epinephrine. She received benadryl prior to started infusion. She was sent to the ED where she complained of chest pain, middle of chest were she received few compression. She was observed overnight with complete resolution of symptoms. Rituximab was added to allergies list and she was discharged in stable condition after being seen by Dr. Alvy Bimler while inpatient.   Discharge Diagnoses:  Active Problems:   HTN (hypertension)   CAD in native artery   Syncope   COPD (chronic obstructive pulmonary disease) (HCC)   CLL (chronic lymphocytic leukemia) (HCC)   Anaphylactoid reaction  Continue all home medications as previously.   Discharge Instructions Discharge Instructions    Discharge instructions    Complete by:  As directed    You were admitted for an anaphylactic reaction to a chemotherapy drug called RITUXIMAB. Fortunately, you have  improved with treatments and are stable for discharge with the following reocmmendations:  - Continue taking all medications as you were.  - You do not need any steroids.  - Follow up with Dr. Alvy Bimler as scheduled to discuss next steps in treatment.  - Rituximab has been added to your allergies list.  - If you notice any trouble breathing seek medical attention right away.     Allergies as of 10/30/2016      Reactions   Rituximab Other (See Comments)   Upset stomach and blood pressure dropped      Medication List    STOP taking these medications   predniSONE 50 MG tablet Commonly known as:  DELTASONE     TAKE these medications   acetaminophen 325 MG tablet Commonly known as:  TYLENOL Take 650 mg by mouth every 6 (six) hours as needed for mild pain.   allopurinol 300 MG tablet Commonly known as:  ZYLOPRIM TAKE 1 TABLET(300 MG) BY MOUTH DAILY   amLODipine 10 MG tablet Commonly known as:  NORVASC TAKE 1 TABLET(10 MG) BY MOUTH DAILY   aspirin EC 81 MG tablet Take 81 mg by mouth daily.   COMBIVENT RESPIMAT 20-100 MCG/ACT Aers respimat Generic drug:  Ipratropium-Albuterol Take 2 puffs by mouth every 6 (six) hours as needed for wheezing or shortness of breath.   isosorbide mononitrate 30 MG 24 hr tablet Commonly known as:  IMDUR Take 30 mg by mouth daily.   lidocaine-prilocaine cream Commonly known as:  EMLA Apply 1 application topically as needed. What changed:  reasons to take this   lovastatin 40 MG tablet Commonly known as:  MEVACOR Take 40 mg by mouth at bedtime.  montelukast 10 MG tablet Commonly known as:  SINGULAIR Take 10 mg by mouth at bedtime.   ondansetron 8 MG tablet Commonly known as:  ZOFRAN Take 1 tablet (8 mg total) by mouth every 8 (eight) hours as needed for nausea.   potassium chloride 10 MEQ tablet Commonly known as:  K-DUR Take 10 mEq by mouth daily.   promethazine 12.5 MG tablet Commonly known as:  PHENERGAN Take 1 tablet (12.5 mg  total) by mouth every 6 (six) hours as needed for nausea.   QUEtiapine 25 MG tablet Commonly known as:  SEROQUEL Take 12.5 mg by mouth at bedtime.      Follow-up Information    Koirala, Dibas, MD Follow up.   Specialty:  Family Medicine Contact information: [address] [place] [address] [phone]        Heath Lark, MD Follow up.   Specialty:  Hematology and Oncology Contact information: [place] [address] [phone]          Allergies  Allergen Reactions  . Rituximab Other (See Comments)    Upset stomach and blood pressure dropped    Consultations:  Oncology, Dr. 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 EXAM: NUCLEAR MEDICINE PET SKULL BASE TO THIGH TECHNIQUE: 7.4 mCi F-18 FDG was injected intravenously. Full-ring PET imaging was performed from the skull base to thigh after the radiotracer. CT data was obtained and used for attenuation correction and anatomic localization. FASTING BLOOD GLUCOSE:  Value: 103 mg/dl COMPARISON:  CT scans from 05/08/2016 FINDINGS: NECK Extensive superficial and deep parotid lymph nodes along with lymph nodes in levels IIa, IIb, Ib, Ia, IV, III, and V bilaterally these demonstrate relatively low-grade activity, with a new right submandibular node on image 35/4 measuring 1.3 cm in diameter with maximum standard uptake value 3.6. CHEST Bilateral supraclavicular fossa, subpectoral, axillary, paratracheal, prevascular, new AP window, subcarinal, hilar, and right internal mammary adenopathy noted. An index right supraclavicular node measuring 2.1 cm in short axis on image 48/4 has a maximum SUV new of 3.7. An index left axillary node on image 58/4 measuring 1.7 cm in short axis has a maximum SUV of 3.2. Coronary, aortic arch, and branch vessel  atherosclerotic vascular disease. Atelectasis or scarring in the lower lobes bilaterally. Background blood pool activity in the mediastinum 3.2. ABDOMEN/PELVIS New new pathologic porta hepatis, gastrohepatic ligament, peripancreatic, retroperitoneal, mesenteric, common iliac, external iliac, internal iliac, perirectal, and inguinal adenopathy noted. An index left periaortic node measuring 2.1 cm in short axis on image 122/4 has a maximum SUV of 4.1. New new an index right external iliac node measuring 1.8 cm in short axis on image 166/4 has a maximum SUV of 4.4. There is sigmoid diverticulosis with some confluent mesenteric edema and mesenteric lymph nodes adjacent to the diverticulosis, diverticulitis is suspected. There is some focal hypermetabolic activity in this vicinity of suspected inflammation along the sigmoid colon, maximum SUV 8.4. No extraluminal gas. There some scattered enlarged omental lymph nodes as well, for example on image 123/4. The spleen measures 12.3 by 6.2 by 13.0 cm (volume = 520 cm^3). There is scattered hypodense lesions in the liver, with the larger lesions photopenic compared to hepatic parenchyma. The smaller lesions are technically nonspecific. There is an exophytic high density lesion from the right kidney upper pole measuring 1.5 cm in diameter on image 113/4 new  new new, I do not see appreciable hypermetabolic activity and accordingly this is probably a complex cyst, although not considered 100% specific. Aortoiliac atherosclerotic vascular disease. Descending colon diverticulosis is present. There is high activity along some soft tissue prominence in the region of the anus, somewhat resembling an external hemorrhoid. Some of this activity could be from urinary contamination/ incontinence. Maximum SUV 13.8. Background hepatic SUV 4.0. SKELETON No hypermetabolic skeletal lesions are identified. There is evidence of lumbar spondylosis and degenerative disc disease. New IMPRESSION: 1.  Extensive adenopathy in the neck, chest, abdomen, and pelvis as detailed above, primarily Deauville 3 and Deauville 4. Mild splenomegaly. 2. High activity in some soft tissue prominence along the anal region, possibly physiologic or due to hemorrhoidal activity or urinary incontinence, but correlation with anorectal exam recommended. 3. Focal abnormal accentuated high activity in the sigmoid colon along a region of abnormal stranding and diverticulosis, suspicious for active diverticulitis. Correlation with the patient's colon cancer screening history is recommended. If screening is not up-to-date, appropriate screening should be considered. 4.  Aortic Atherosclerosis (ICD10-I70.0).  Coronary atherosclerosis. 5. An exophytic high density lesion from the right kidney upper pole does not appear to be hypermetabolic and accordingly is most likely to be a benign complex cyst, but this lesion is not fully characterized on today' s exam. If the patient has hematuria or if otherwise clinically warranted, renal protocol MRI or CT examination could be utilized. 6. Some of the smaller hepatic lesions are nonspecific, but the larger hepatic lesions are photopenic and likely cysts. Electronically Signed   By: Van Clines M.D.   On: 10/21/2016 11:28   Ir US Guide Vasc Access Right  Result Date: 10/23/2016 CLINICAL DATA:  Progressive chronic lymphocytic leukemia and need for porta cath for chemotherapy. EXAM: IMPLANTED PORT A CATH PLACEMENT WITH ULTRASOUND AND FLUOROSCOPIC GUIDANCE ANESTHESIA/SEDATION: 3.0 mg IV Versed; 100 mcg IV Fentanyl Total Moderate Sedation Time:  37 minutes The patient's level of consciousness and physiologic status were continuously monitored during the procedure by Radiology nursing. Additional Medications: 2 g IV Ancef. As antibiotic prophylaxis, Ancef was ordered pre-procedure and administered intravenously within one hour of incision. FLUOROSCOPY TIME:  24 seconds.  2.4 mGy. PROCEDURE: The  procedure, risks, benefits, and alternatives were explained to the patient. Questions regarding the procedure were encouraged and answered. The patient understands and consents to the procedure. A time-out was performed prior to initiating the procedure. Ultrasound was utilized to confirm patency of the right internal jugular vein. The right neck and chest were prepped with chlorhexidine in a sterile fashion, and a sterile drape was applied covering the operative field. Maximum barrier sterile technique with sterile gowns and gloves were used for the procedure. Local anesthesia was provided with 1% lidocaine. After creating a small venotomy incision, a 21 gauge needle was advanced into the right internal jugular vein under direct, real-time ultrasound guidance. Ultrasound image documentation was performed. After securing guidewire access, an 8 Fr dilator was placed. A J-wire was kinked to measure appropriate catheter length. A subcutaneous port pocket was then created along the upper chest wall utilizing sharp and blunt dissection. Portable cautery was utilized. The pocket was irrigated with sterile saline. A single lumen power injectable port was chosen for placement. The 8 Fr catheter was tunneled from the port pocket site to the venotomy incision. The port was placed in the pocket. External catheter was trimmed to appropriate length based on guidewire measurement. At the venotomy, an 8 Fr peel-away sheath was placed  over a guidewire. The catheter was then placed through the sheath and the sheath removed. Final catheter positioning was confirmed and documented with a fluoroscopic spot image. The port was accessed with a needle and aspirated and flushed with heparinized saline. The access needle was removed. The venotomy and port pocket incisions were closed with subcutaneous 3-0 Monocryl and subcuticular 4-0 Vicryl. Dermabond was applied to both incisions. COMPLICATIONS: COMPLICATIONS None FINDINGS: After catheter  placement, the tip lies at the cavo-atrial junction. The catheter aspirates normally and is ready for immediate use. IMPRESSION: Placement of single lumen port a cath via right internal jugular vein. The catheter tip lies at the cavo-atrial junction. A power injectable port a cath was placed and is ready for immediate use. Electronically Signed   By: Aletta Edouard M.D.   On: 10/23/2016 10:42   Dg Chest Port 1 View  Result Date: 10/29/2016 CLINICAL DATA:  Reduced level of consciousness this morning while receiving chemotherapy infusion, anaphylactoid reaction to chemotherapy, chest soreness post compressions, history hypertension, asthma, coronary artery disease, COPD, CHF, small lymphocytic lymphoma EXAM: PORTABLE CHEST 1 VIEW COMPARISON:  Portable exam at 1009 hours compared to 05/08/2016 FINDINGS: RIGHT jugular Port-A-Cath with tip projecting over SVC. Normal heart size, mediastinal contours, and pulmonary vascularity. Atherosclerotic calcification and mild tortuosity of thoracic aorta. Bronchitic changes with minimal chronic bibasilar atelectasis. No acute infiltrate, pleural effusion or pneumothorax. Bones demineralized. IMPRESSION: Bronchitic changes with chronic bibasilar atelectasis. No acute abnormalities. Aortic Atherosclerosis (ICD10-I70.0). Electronically Signed   By: Lavonia Dana M.D.   On: 10/29/2016 10:24   Ir Fluoro Guide Port Insertion Right  Result Date: 10/23/2016 CLINICAL DATA:  Progressive chronic lymphocytic leukemia and need for porta cath for chemotherapy. EXAM: IMPLANTED PORT A CATH PLACEMENT WITH ULTRASOUND AND FLUOROSCOPIC GUIDANCE ANESTHESIA/SEDATION: 3.0 mg IV Versed; 100 mcg IV Fentanyl Total Moderate Sedation Time:  37 minutes The patient's level of consciousness and physiologic status were continuously monitored during the procedure by Radiology nursing. Additional Medications: 2 g IV Ancef. As antibiotic prophylaxis, Ancef was ordered pre-procedure and administered  intravenously within one hour of incision. FLUOROSCOPY TIME:  24 seconds.  2.4 mGy. PROCEDURE: The procedure, risks, benefits, and alternatives were explained to the patient. Questions regarding the procedure were encouraged and answered. The patient understands and consents to the procedure. A time-out was performed prior to initiating the procedure. Ultrasound was utilized to confirm patency of the right internal jugular vein. The right neck and chest were prepped with chlorhexidine in a sterile fashion, and a sterile drape was applied covering the operative field. Maximum barrier sterile technique with sterile gowns and gloves were used for the procedure. Local anesthesia was provided with 1% lidocaine. After creating a small venotomy incision, a 21 gauge needle was advanced into the right internal jugular vein under direct, real-time ultrasound guidance. Ultrasound image documentation was performed. After securing guidewire access, an 8 Fr dilator was placed. A J-wire was kinked to measure appropriate catheter length. A subcutaneous port pocket was then created along the upper chest wall utilizing sharp and blunt dissection. Portable cautery was utilized. The pocket was irrigated with sterile saline. A single lumen power injectable port was chosen for placement. The 8 Fr catheter was tunneled from the port pocket site to the venotomy incision. The port was placed in the pocket. External catheter was trimmed to appropriate length based on guidewire measurement. At the venotomy, an 8 Fr peel-away sheath was placed over a guidewire. The catheter was then placed through the sheath  and the sheath removed. Final catheter positioning was confirmed and documented with a fluoroscopic spot image. The port was accessed with a needle and aspirated and flushed with heparinized saline. The access needle was removed. The venotomy and port pocket incisions were closed with subcutaneous 3-0 Monocryl and subcuticular 4-0 Vicryl.  Dermabond was applied to both incisions. COMPLICATIONS: COMPLICATIONS None FINDINGS: After catheter placement, the tip lies at the cavo-atrial junction. The catheter aspirates normally and is ready for immediate use. IMPRESSION: Placement of single lumen port a cath via right internal jugular vein. The catheter tip lies at the cavo-atrial junction. A power injectable port a cath was placed and is ready for immediate use. Electronically Signed   By: Aletta Edouard M.D.   On: 10/23/2016 10:42   Subjective: Feels well, slept poorly but no trouble breathing.   Discharge Exam: BP (!) 154/59 (BP Location: Right Arm)   Pulse 69   Temp 97.8 F (36.6 C) (Oral)   Resp 20   Ht 5' 2"  (1.575 m)   Wt 69.1 kg (152 lb 5.4 oz)   SpO2 96%   BMI 27.86 kg/m   General: Pt is alert, awake, not in acute distress. Voice normal.  Cardiovascular: RRR, S1/S2 +, no rubs, no gallops Respiratory: CTA bilaterally, no wheezing, no rhonchi, no stridor or angioedema Abdominal: Soft, NT, ND, bowel sounds + Extremities: No edema, no cyanosis  Labs: Basic Metabolic Panel:  Recent Labs Lab 10/29/16 0955 10/29/16 1248 10/30/16 0409  NA 130*  --  135  K 4.7  --  3.6  CL 97*  --  102  CO2 22  --  25  GLUCOSE 132*  --  139*  BUN 33*  --  26*  CREATININE 1.10*  --  0.99  CALCIUM 8.7*  --  8.2*  MG  --  1.7  --    CBC:  Recent Labs Lab 10/29/16 0955 10/30/16 0409  WBC 33.2* 93.4*  NEUTROABS 0.3* 0.9*  HGB 11.3* 10.3*  HCT 33.5* 31.2*  MCV 88.9 87.9  PLT 185 222   Cardiac Enzymes:  Recent Labs Lab 10/29/16 1248 10/29/16 1855 10/30/16 0039  TROPONINI <0.03 <0.03 <0.03   Time coordinating discharge: Approximately 20 minutes  Vance Gather, MD  [facility] 10/30/2016, 12:11 PM Pager [phone]

## 2016-10-30 NOTE — Progress Notes (Signed)
No new orders for now,

## 2016-10-30 NOTE — Progress Notes (Signed)
CRITICAL VALUE ALERT  Critical Value:  WBC 93.4  Date & Time Notied:  10/30/16; 9741  Provider Notified: Yes  Orders Received/Actions taken: Awaiting new orders.

## 2016-10-30 NOTE — Progress Notes (Signed)
Carla Conway Pra   DOB:1926/05/16   QQ#:[id number]    Subjective: She feels well.  She denies wheezing, pain of feeling sick since admission.  She had poor sleep last night  Objective:  Vitals:   10/29/16 2208 10/30/16 0412  BP: (!) 165/68 (!) 154/59  Pulse: 73 69  Resp: 18 20  Temp: 98 F (36.7 C) 97.8 F (36.6 C)     Intake/Output Summary (Last 24 hours) at 10/30/16 0719 Last data filed at 10/30/16 0515  Gross per 24 hour  Intake             1810 ml  Output              251 ml  Net             1559 ml    GENERAL:alert, no distress and comfortable SKIN: skin color, texture, turgor are normal, no rashes or significant lesions EYES: normal, Conjunctiva are pink and non-injected, sclera clear OROPHARYNX:no exudate, no erythema and lips, buccal mucosa, and tongue normal  NECK: supple, thyroid normal size, non-tender, without nodularity LYMPH: Diffuse palpable lymphadenopathy, unchanged  LUNGS: clear to auscultation and percussion with normal breathing effort HEART: regular rate & rhythm and no murmurs and no lower extremity edema ABDOMEN:abdomen soft, non-tender and normal bowel sounds Musculoskeletal:no cyanosis of digits and no clubbing  NEURO: alert & oriented x 3 with fluent speech, no focal motor/sensory deficits   Labs:  Lab Results  Component Value Date   WBC 93.4 (HH) 10/30/2016   HGB 10.3 (L) 10/30/2016   HCT 31.2 (L) 10/30/2016   MCV 87.9 10/30/2016   PLT 222 10/30/2016   NEUTROABS 0.9 (L) 10/30/2016    Lab Results  Component Value Date   NA 135 10/30/2016   K 3.6 10/30/2016   CL 102 10/30/2016   CO2 25 10/30/2016    Studies:  Dg Chest Port 1 View  Result Date: 10/29/2016 CLINICAL DATA:  Reduced level of consciousness this morning while receiving chemotherapy infusion, anaphylactoid reaction to chemotherapy, chest soreness post compressions, history hypertension, asthma, coronary artery disease, COPD, CHF, small lymphocytic lymphoma EXAM: PORTABLE CHEST 1  VIEW COMPARISON:  Portable exam at 1009 hours compared to 05/08/2016 FINDINGS: RIGHT jugular Port-A-Cath with tip projecting over SVC. Normal heart size, mediastinal contours, and pulmonary vascularity. Atherosclerotic calcification and mild tortuosity of thoracic aorta. Bronchitic changes with minimal chronic bibasilar atelectasis. No acute infiltrate, pleural effusion or pneumothorax. Bones demineralized. IMPRESSION: Bronchitic changes with chronic bibasilar atelectasis. No acute abnormalities. Aortic Atherosclerosis (ICD10-I70.0). Electronically Signed   By: Lavonia Dana M.D.   On: 10/29/2016 10:24    Assessment & Plan:   CLL The patient has significant hypersensitivity reaction to rituximab Treatment was subsequently discontinued I have scheduled appointment to see her back in my office in 2 weeks, on 11/12/2016 to discuss further plan of care  Hypersensitivity reaction to rituximab She has responded well to schedule steroids and Benadryl She does not need to go home on prednisone or Benadryl  Anemia, neutropenia Due to CLL She is not symptomatic We have previously discussed neutropenic precaution  Discharge planning She can be discharged today I will sign off.  Please call if questions arise   Heath Lark, MD 10/30/2016  7:19 AM

## 2016-11-05 ENCOUNTER — Encounter: Payer: Self-pay | Admitting: General Practice

## 2016-11-05 NOTE — Progress Notes (Signed)
[facility] Spiritual Care Note  Followed up with dtr [name] by phone as planned.  She and pt were at grocery store together.  Dtr reports much improvement and no concerns at this time.  She plans to share greetings and ongoing Spiritual Care availability with pt.  Please also page if immediate needs arise.  Thank you.   [place], [place], [facility] Pager [phone] Voicemail [phone]

## 2016-11-13 ENCOUNTER — Encounter: Payer: Self-pay | Admitting: Hematology and Oncology

## 2016-11-13 ENCOUNTER — Telehealth: Payer: Self-pay | Admitting: Hematology and Oncology

## 2016-11-13 ENCOUNTER — Ambulatory Visit (HOSPITAL_BASED_OUTPATIENT_CLINIC_OR_DEPARTMENT_OTHER): Payer: Medicare Other | Admitting: Hematology and Oncology

## 2016-11-13 ENCOUNTER — Other Ambulatory Visit: Payer: Self-pay | Admitting: Hematology and Oncology

## 2016-11-13 ENCOUNTER — Other Ambulatory Visit (HOSPITAL_BASED_OUTPATIENT_CLINIC_OR_DEPARTMENT_OTHER): Payer: Medicare Other

## 2016-11-13 ENCOUNTER — Telehealth: Payer: Self-pay | Admitting: *Deleted

## 2016-11-13 VITALS — BP 144/55 | HR 87 | Temp 97.9°F | Resp 18 | Ht 62.0 in | Wt 149.5 lb

## 2016-11-13 DIAGNOSIS — D649 Anemia, unspecified: Secondary | ICD-10-CM

## 2016-11-13 DIAGNOSIS — C9190 Lymphoid leukemia, unspecified not having achieved remission: Secondary | ICD-10-CM | POA: Diagnosis not present

## 2016-11-13 DIAGNOSIS — D708 Other neutropenia: Secondary | ICD-10-CM

## 2016-11-13 DIAGNOSIS — C8300 Small cell B-cell lymphoma, unspecified site: Secondary | ICD-10-CM

## 2016-11-13 DIAGNOSIS — C9110 Chronic lymphocytic leukemia of B-cell type not having achieved remission: Secondary | ICD-10-CM | POA: Diagnosis not present

## 2016-11-13 DIAGNOSIS — R300 Dysuria: Secondary | ICD-10-CM

## 2016-11-13 DIAGNOSIS — D709 Neutropenia, unspecified: Secondary | ICD-10-CM

## 2016-11-13 DIAGNOSIS — D638 Anemia in other chronic diseases classified elsewhere: Secondary | ICD-10-CM

## 2016-11-13 LAB — CBC WITH DIFFERENTIAL/PLATELET
BASO%: 0 % (ref 0.0–2.0)
Basophils Absolute: 0 10*3/uL (ref 0.0–0.1)
EOS%: 0.3 % (ref 0.0–7.0)
Eosinophils Absolute: 0.2 10*3/uL (ref 0.0–0.5)
HCT: 32.7 % — ABNORMAL LOW (ref 34.8–46.6)
HEMOGLOBIN: 10.6 g/dL — AB (ref 11.6–15.9)
LYMPH%: 97.2 % — AB (ref 14.0–49.7)
MCH: 29.9 pg (ref 25.1–34.0)
MCHC: 32.4 g/dL (ref 31.5–36.0)
MCV: 92.3 fL (ref 79.5–101.0)
MONO#: 0.4 10*3/uL (ref 0.1–0.9)
MONO%: 0.6 % (ref 0.0–14.0)
NEUT%: 1.9 % — ABNORMAL LOW (ref 38.4–76.8)
NEUTROS ABS: 1.1 10*3/uL — AB (ref 1.5–6.5)
Platelets: 239 10*3/uL (ref 145–400)
RBC: 3.54 10*6/uL — AB (ref 3.70–5.45)
RDW: 16.1 % — AB (ref 11.2–14.5)
WBC: 56.3 10*3/uL — AB (ref 3.9–10.3)
lymph#: 54.7 10*3/uL — ABNORMAL HIGH (ref 0.9–3.3)

## 2016-11-13 LAB — COMPREHENSIVE METABOLIC PANEL
ALT: 18 U/L (ref 0–55)
ANION GAP: 9 meq/L (ref 3–11)
AST: 17 U/L (ref 5–34)
Albumin: 3.4 g/dL — ABNORMAL LOW (ref 3.5–5.0)
Alkaline Phosphatase: 108 U/L (ref 40–150)
BUN: 21.3 mg/dL (ref 7.0–26.0)
CALCIUM: 9.7 mg/dL (ref 8.4–10.4)
CHLORIDE: 99 meq/L (ref 98–109)
CO2: 27 meq/L (ref 22–29)
Creatinine: 1 mg/dL (ref 0.6–1.1)
EGFR: 48 mL/min/{1.73_m2} — ABNORMAL LOW (ref >90)
Glucose: 101 mg/dl (ref 70–140)
POTASSIUM: 4.8 meq/L (ref 3.5–5.1)
Sodium: 136 mEq/L (ref 136–145)
Total Bilirubin: 0.44 mg/dL (ref 0.20–1.20)
Total Protein: 6.4 g/dL (ref 6.4–8.3)

## 2016-11-13 LAB — URINALYSIS, MICROSCOPIC - CHCC
Bilirubin (Urine): NEGATIVE
GLUCOSE UR CHCC: NEGATIVE mg/dL
Ketones: NEGATIVE mg/dL
NITRITE: POSITIVE
PH: 6 (ref 4.6–8.0)
PROTEIN: 30 mg/dL
Specific Gravity, Urine: 1.015 (ref 1.003–1.035)
UROBILINOGEN UR: 0.2 mg/dL (ref 0.2–1)

## 2016-11-13 LAB — TECHNOLOGIST REVIEW

## 2016-11-13 LAB — URIC ACID: URIC ACID, SERUM: 3.2 mg/dL (ref 2.6–7.4)

## 2016-11-13 MED ORDER — CHLORAMBUCIL 2 MG PO TABS: 2.0000 mg | ORAL_TABLET | Freq: Every day | ORAL | 1 refills | Status: DC

## 2016-11-13 MED ORDER — CIPROFLOXACIN HCL 250 MG PO TABS: 250.0000 mg | ORAL_TABLET | Freq: Two times a day (BID) | ORAL | 0 refills | Status: AC

## 2016-11-13 MED ORDER — PREDNISONE 5 MG PO TABS: 5.0000 mg | ORAL_TABLET | Freq: Every day | ORAL | 1 refills | Status: DC

## 2016-11-13 NOTE — Assessment & Plan Note (Signed)
She has neutropenia likely due to CLL but remain asymptomatic We discussed neutropenic precaution and I educated the patient and her daughter signs and symptoms to watch out for infection

## 2016-11-13 NOTE — Telephone Encounter (Signed)
Oral Oncology Patient Advocate Encounter  Received notification from [employer] that prior authorization for [place] is required.  PA submitted on CoverMyMeds Key [id number] Status is pending  Oral Oncology Clinic will continue to follow.  Fabio Asa. Melynda Keller, AAS Specialty Pharmacy Patient Advocate [facility] 11/13/2016 3:16 PM

## 2016-11-13 NOTE — Telephone Encounter (Signed)
Left message with note below

## 2016-11-13 NOTE — Assessment & Plan Note (Signed)
This is likely anemia of chronic disease. The patient denies recent history of bleeding such as epistaxis, hematuria or hematochezia. She is asymptomatic from the anemia. We will observe for now.

## 2016-11-13 NOTE — Progress Notes (Signed)
[place] OFFICE PROGRESS NOTE  Patient Care Team: Koirala, Dibas, MD as PCP - General (Family Medicine)  SUMMARY OF ONCOLOGIC HISTORY:   CLL (chronic lymphocytic leukemia) ([place])   05/02/2013 Initial Diagnosis    She was found to have abnormal CBC from at least since 2015. According to the records from her primary care doctor that were faxed to the [facility], she had leukocytosis at least since January 2015. On 05/02/2013, total white blood cell count was 28.3, normal hemoglobin 13.4 and platelet count 239,000. She had predominantly Lymphocytosis. On 06/20/2013, a CBC show white count of 26.7, hemoglobin 12.9 and platelet count of 299,000 On admission, on 05/08/2016, white blood cell count was 38.4, hemoglobin 12.1 and platelet count of 210. Differential show predominantly lymphocytosis.      05/08/2016 Imaging    CT scan of chest 1. Negative for acute pulmonary embolism 2. Numerous pathologic nodes in the mediastinum, hila, axillae, supraclavicular regions and upper abdomen. The adenopathy is suspicious for malignancy such as lymphoma or CLL. If tissue diagnosis is desired, many of the supraclavicular and axillary nodes should be palpable and accessible to excisional biopsy. 3. Low-attenuation liver lesions, incompletely imaged and not characterized but more likely benign. 4. Small hiatal hernia.      09/22/2016 Pathology Results    Peripheral Blood Flow Cytometry - CHRONIC LYMPHOCYTIC LEUKEMIA. - SEE COMMENT. Diagnosis Comment: There is a population of monoclonal B-cells with expression of CD5 and CD23. The phenotype is consistent with chronic lymphocytic leukemia      09/22/2016 Pathology Results    FISH confirmed trisomy 12      10/21/2016 PET scan    1. Extensive adenopathy in the neck, chest, abdomen, and pelvis as detailed above, primarily Deauville 3 and Deauville 4. Mild splenomegaly. 2. High activity in some soft tissue prominence along the anal region,  possibly physiologic or due to hemorrhoidal activity or urinary incontinence, but correlation with anorectal exam recommended. 3. Focal abnormal accentuated high activity in the sigmoid colon along a region of abnormal stranding and diverticulosis, suspicious for active diverticulitis. Correlation with the patient's colon cancer screening history is recommended. If screening is not up-to-date, appropriate screening should be considered. 4. Aortic Atherosclerosis (ICD10-I70.0). Coronary atherosclerosis. 5. An exophytic high density lesion from the right kidney upper pole does not appear to be hypermetabolic and accordingly is most likely to be a benign complex cyst, but this lesion is not fully characterized on today' s exam. If the patient has hematuria or if otherwise clinically warranted, renal protocol MRI or CT examination could be utilized. 6. Some of the smaller hepatic lesions are nonspecific, but the larger hepatic lesions are photopenic and likely cysts.      10/23/2016 Procedure    Placement of single lumen port a cath via right internal jugular vein. The catheter tip lies at the cavo-atrial junction. A power injectable port a cath was placed and is ready for immediate use      10/29/2016 - 10/30/2016 Hospital Admission    She was admitted after allergic reaction to Rituximab      10/29/2016 Adverse Reaction    She received Rituximab complicated by allergic reaction. Treatment was abandoned       INTERVAL HISTORY: Please see below for problem oriented charting. She returns with her daughter for further follow-up Some of the lymphadenopathy had resolved Urinary frequency and dysuria recently No recent fever, chills or cough No nausea or vomiting  REVIEW OF SYSTEMS:   Constitutional: Denies  fevers, chills or abnormal weight loss Eyes: Denies blurriness of vision Ears, nose, mouth, throat, and face: Denies mucositis or sore throat Respiratory: Denies cough, dyspnea or  wheezes Cardiovascular: Denies palpitation, chest discomfort or lower extremity swelling Gastrointestinal:  Denies nausea, heartburn or change in bowel habits Skin: Denies abnormal skin rashes Lymphatics: Denies new lymphadenopathy or easy bruising Neurological:Denies numbness, tingling or new weaknesses Behavioral/Psych: Mood is stable, no new changes  All other systems were reviewed with the patient and are negative.  I have reviewed the past medical history, past surgical history, social history and family history with the patient and they are unchanged from previous note.  ALLERGIES:  is allergic to rituximab.  MEDICATIONS:  Current Outpatient Prescriptions  Medication Sig Dispense Refill  . acetaminophen (TYLENOL) 325 MG tablet Take 650 mg by mouth every 6 (six) hours as needed for mild pain.    Marland Kitchen allopurinol (ZYLOPRIM) 300 MG tablet TAKE 1 TABLET(300 MG) BY MOUTH DAILY 30 tablet 0  . amLODipine (NORVASC) 10 MG tablet TAKE 1 TABLET(10 MG) BY MOUTH DAILY 30 tablet 0  . aspirin EC 81 MG tablet Take 81 mg by mouth daily.    . chlorambucil (LEUKERAN) 2 MG tablet Take 1 tablet (2 mg total) by mouth daily. Give on an empty stomach 1 hour before or 2 hours after meals. 30 tablet 1  . ciprofloxacin (CIPRO) 250 MG tablet Take 1 tablet (250 mg total) by mouth 2 (two) times daily. 6 tablet 0  . COMBIVENT RESPIMAT 20-100 MCG/ACT AERS respimat Take 2 puffs by mouth every 6 (six) hours as needed for wheezing or shortness of breath.     . isosorbide mononitrate (IMDUR) 30 MG 24 hr tablet Take 30 mg by mouth daily.     Marland Kitchen lidocaine-prilocaine (EMLA) cream Apply 1 application topically as needed. (Patient taking differently: Apply 1 application topically as needed (For port-a-cath.). ) 30 g 6  . lovastatin (MEVACOR) 40 MG tablet Take 40 mg by mouth at bedtime.     . montelukast (SINGULAIR) 10 MG tablet Take 10 mg by mouth at bedtime.     . ondansetron (ZOFRAN) 8 MG tablet Take 1 tablet (8 mg total) by  mouth every 8 (eight) hours as needed for nausea. 30 tablet 3  . potassium chloride (K-DUR) 10 MEQ tablet Take 10 mEq by mouth daily.    . predniSONE (DELTASONE) 5 MG tablet Take 1 tablet (5 mg total) by mouth daily with breakfast. 60 tablet 1  . promethazine (PHENERGAN) 12.5 MG tablet Take 1 tablet (12.5 mg total) by mouth every 6 (six) hours as needed for nausea. 30 tablet 3  . QUEtiapine (SEROQUEL) 25 MG tablet Take 12.5 mg by mouth at bedtime.      No current facility-administered medications for this visit.     PHYSICAL EXAMINATION: ECOG PERFORMANCE STATUS: 1 - Symptomatic but completely ambulatory  Vitals:   11/13/16 0822  BP: (!) 144/55  Pulse: 87  Resp: 18  Temp: 97.9 F (36.6 C)  SpO2: 96%   Filed Weights   11/13/16 0822  Weight: 149 lb 8 oz (67.8 kg)    GENERAL:alert, no distress and comfortable SKIN: skin color, texture, turgor are normal, no rashes or significant lesions EYES: normal, Conjunctiva are pink and non-injected, sclera clear OROPHARYNX:no exudate, no erythema and lips, buccal mucosa, and tongue normal  NECK: supple, thyroid normal size, non-tender, without nodularity LYMPH: Some of the lymphadenopathy had resolved LUNGS: clear to auscultation and percussion with normal breathing effort  HEART: regular rate & rhythm and no murmurs and no lower extremity edema ABDOMEN:abdomen soft, non-tender and normal bowel sounds Musculoskeletal:no cyanosis of digits and no clubbing  NEURO: alert & oriented x 3 with fluent speech, no focal motor/sensory deficits  LABORATORY DATA:  I have reviewed the data as listed    Component Value Date/Time   NA 136 11/13/2016 0810   K 4.8 11/13/2016 0810   CL 102 10/30/2016 0409   CO2 27 11/13/2016 0810   GLUCOSE 101 11/13/2016 0810   BUN 21.3 11/13/2016 0810   CREATININE 1.0 11/13/2016 0810   CALCIUM 9.7 11/13/2016 0810   PROT 6.4 11/13/2016 0810   ALBUMIN 3.4 (L) 11/13/2016 0810   AST 17 11/13/2016 0810   ALT 18  11/13/2016 0810   ALKPHOS 108 11/13/2016 0810   BILITOT 0.44 11/13/2016 0810   GFRNONAA 49 (L) 10/30/2016 0409   GFRAA 57 (L) 10/30/2016 0409    No results found for: SPEP, UPEP  Lab Results  Component Value Date   WBC 56.3 (HH) 11/13/2016   NEUTROABS 1.1 (L) 11/13/2016   HGB 10.6 (L) 11/13/2016   HCT 32.7 (L) 11/13/2016   MCV 92.3 11/13/2016   PLT 239 11/13/2016      Chemistry      Component Value Date/Time   NA 136 11/13/2016 0810   K 4.8 11/13/2016 0810   CL 102 10/30/2016 0409   CO2 27 11/13/2016 0810   BUN 21.3 11/13/2016 0810   CREATININE 1.0 11/13/2016 0810      Component Value Date/Time   CALCIUM 9.7 11/13/2016 0810   ALKPHOS 108 11/13/2016 0810   AST 17 11/13/2016 0810   ALT 18 11/13/2016 0810   BILITOT 0.44 11/13/2016 0810       RADIOGRAPHIC STUDIES: I have personally reviewed the radiological images as listed and agreed with the findings in the report. 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 EXAM: NUCLEAR MEDICINE PET SKULL BASE TO THIGH TECHNIQUE: 7.4 mCi F-18 FDG was injected intravenously. Full-ring PET imaging was performed from the skull base to thigh after the radiotracer. CT data was obtained and used for attenuation correction and anatomic localization. FASTING BLOOD GLUCOSE:  Value: 103 mg/dl COMPARISON:  CT scans from 05/08/2016 FINDINGS: NECK Extensive superficial and deep parotid lymph nodes along with lymph nodes in levels IIa, IIb, Ib, Ia, IV, III, and V bilaterally these demonstrate relatively low-grade activity, with a new right submandibular node on image 35/4 measuring 1.3 cm in diameter with maximum standard uptake value 3.6. CHEST Bilateral supraclavicular fossa, subpectoral, axillary, paratracheal, prevascular, new AP window, subcarinal, hilar, and right internal mammary adenopathy noted. An index right supraclavicular node measuring 2.1  cm in short axis on image 48/4 has a maximum SUV new of 3.7. An index left axillary node on image 58/4 measuring 1.7 cm in short axis has a maximum SUV of 3.2. Coronary, aortic arch, and branch vessel atherosclerotic vascular disease. Atelectasis or scarring in the lower lobes bilaterally. Background blood pool activity in the mediastinum 3.2. ABDOMEN/PELVIS New new pathologic porta hepatis, gastrohepatic ligament, peripancreatic, retroperitoneal, mesenteric, common iliac, external iliac, internal iliac, perirectal, and inguinal adenopathy noted. An index left periaortic node measuring 2.1 cm in short axis on image 122/4 has a maximum SUV of 4.1. New new an index right external iliac node measuring 1.8 cm in short axis on image 166/4 has a maximum SUV of 4.4. There  is sigmoid diverticulosis with some confluent mesenteric edema and mesenteric lymph nodes adjacent to the diverticulosis, diverticulitis is suspected. There is some focal hypermetabolic activity in this vicinity of suspected inflammation along the sigmoid colon, maximum SUV 8.4. No extraluminal gas. There some scattered enlarged omental lymph nodes as well, for example on image 123/4. The spleen measures 12.3 by 6.2 by 13.0 cm (volume = 520 cm^3). There is scattered hypodense lesions in the liver, with the larger lesions photopenic compared to hepatic parenchyma. The smaller lesions are technically nonspecific. There is an exophytic high density lesion from the right kidney upper pole measuring 1.5 cm in diameter on image 113/4 new new new, I do not see appreciable hypermetabolic activity and accordingly this is probably a complex cyst, although not considered 100% specific. Aortoiliac atherosclerotic vascular disease. Descending colon diverticulosis is present. There is high activity along some soft tissue prominence in the region of the anus, somewhat resembling an external hemorrhoid. Some of this activity could be from urinary contamination/  incontinence. Maximum SUV 13.8. Background hepatic SUV 4.0. SKELETON No hypermetabolic skeletal lesions are identified. There is evidence of lumbar spondylosis and degenerative disc disease. New IMPRESSION: 1. Extensive adenopathy in the neck, chest, abdomen, and pelvis as detailed above, primarily Deauville 3 and Deauville 4. Mild splenomegaly. 2. High activity in some soft tissue prominence along the anal region, possibly physiologic or due to hemorrhoidal activity or urinary incontinence, but correlation with anorectal exam recommended. 3. Focal abnormal accentuated high activity in the sigmoid colon along a region of abnormal stranding and diverticulosis, suspicious for active diverticulitis. Correlation with the patient's colon cancer screening history is recommended. If screening is not up-to-date, appropriate screening should be considered. 4.  Aortic Atherosclerosis (ICD10-I70.0).  Coronary atherosclerosis. 5. An exophytic high density lesion from the right kidney upper pole does not appear to be hypermetabolic and accordingly is most likely to be a benign complex cyst, but this lesion is not fully characterized on today' s exam. If the patient has hematuria or if otherwise clinically warranted, renal protocol MRI or CT examination could be utilized. 6. Some of the smaller hepatic lesions are nonspecific, but the larger hepatic lesions are photopenic and likely cysts. Electronically Signed   By: Van Clines M.D.   On: 10/21/2016 11:28   Ir US Guide Vasc Access Right  Result Date: 10/23/2016 CLINICAL DATA:  Progressive chronic lymphocytic leukemia and need for porta cath for chemotherapy. EXAM: IMPLANTED PORT A CATH PLACEMENT WITH ULTRASOUND AND FLUOROSCOPIC GUIDANCE ANESTHESIA/SEDATION: 3.0 mg IV Versed; 100 mcg IV Fentanyl Total Moderate Sedation Time:  37 minutes The patient's level of consciousness and physiologic status were continuously monitored during the procedure by Radiology nursing.  Additional Medications: 2 g IV Ancef. As antibiotic prophylaxis, Ancef was ordered pre-procedure and administered intravenously within one hour of incision. FLUOROSCOPY TIME:  24 seconds.  2.4 mGy. PROCEDURE: The procedure, risks, benefits, and alternatives were explained to the patient. Questions regarding the procedure were encouraged and answered. The patient understands and consents to the procedure. A time-out was performed prior to initiating the procedure. Ultrasound was utilized to confirm patency of the right internal jugular vein. The right neck and chest were prepped with chlorhexidine in a sterile fashion, and a sterile drape was applied covering the operative field. Maximum barrier sterile technique with sterile gowns and gloves were used for the procedure. Local anesthesia was provided with 1% lidocaine. After creating a small venotomy incision, a 21 gauge needle was advanced into the right  internal jugular vein under direct, real-time ultrasound guidance. Ultrasound image documentation was performed. After securing guidewire access, an 8 Fr dilator was placed. A J-wire was kinked to measure appropriate catheter length. A subcutaneous port pocket was then created along the upper chest wall utilizing sharp and blunt dissection. Portable cautery was utilized. The pocket was irrigated with sterile saline. A single lumen power injectable port was chosen for placement. The 8 Fr catheter was tunneled from the port pocket site to the venotomy incision. The port was placed in the pocket. External catheter was trimmed to appropriate length based on guidewire measurement. At the venotomy, an 8 Fr peel-away sheath was placed over a guidewire. The catheter was then placed through the sheath and the sheath removed. Final catheter positioning was confirmed and documented with a fluoroscopic spot image. The port was accessed with a needle and aspirated and flushed with heparinized saline. The access needle was  removed. The venotomy and port pocket incisions were closed with subcutaneous 3-0 Monocryl and subcuticular 4-0 Vicryl. Dermabond was applied to both incisions. COMPLICATIONS: COMPLICATIONS None FINDINGS: After catheter placement, the tip lies at the cavo-atrial junction. The catheter aspirates normally and is ready for immediate use. IMPRESSION: Placement of single lumen port a cath via right internal jugular vein. The catheter tip lies at the cavo-atrial junction. A power injectable port a cath was placed and is ready for immediate use. Electronically Signed   By: Aletta Edouard M.D.   On: 10/23/2016 10:42   Dg Chest Port 1 View  Result Date: 10/29/2016 CLINICAL DATA:  Reduced level of consciousness this morning while receiving chemotherapy infusion, anaphylactoid reaction to chemotherapy, chest soreness post compressions, history hypertension, asthma, coronary artery disease, COPD, CHF, small lymphocytic lymphoma EXAM: PORTABLE CHEST 1 VIEW COMPARISON:  Portable exam at 1009 hours compared to 05/08/2016 FINDINGS: RIGHT jugular Port-A-Cath with tip projecting over SVC. Normal heart size, mediastinal contours, and pulmonary vascularity. Atherosclerotic calcification and mild tortuosity of thoracic aorta. Bronchitic changes with minimal chronic bibasilar atelectasis. No acute infiltrate, pleural effusion or pneumothorax. Bones demineralized. IMPRESSION: Bronchitic changes with chronic bibasilar atelectasis. No acute abnormalities. Aortic Atherosclerosis (ICD10-I70.0). Electronically Signed   By: Lavonia Dana M.D.   On: 10/29/2016 10:24   Ir Fluoro Guide Port Insertion Right  Result Date: 10/23/2016 CLINICAL DATA:  Progressive chronic lymphocytic leukemia and need for porta cath for chemotherapy. EXAM: IMPLANTED PORT A CATH PLACEMENT WITH ULTRASOUND AND FLUOROSCOPIC GUIDANCE ANESTHESIA/SEDATION: 3.0 mg IV Versed; 100 mcg IV Fentanyl Total Moderate Sedation Time:  37 minutes The patient's level of  consciousness and physiologic status were continuously monitored during the procedure by Radiology nursing. Additional Medications: 2 g IV Ancef. As antibiotic prophylaxis, Ancef was ordered pre-procedure and administered intravenously within one hour of incision. FLUOROSCOPY TIME:  24 seconds.  2.4 mGy. PROCEDURE: The procedure, risks, benefits, and alternatives were explained to the patient. Questions regarding the procedure were encouraged and answered. The patient understands and consents to the procedure. A time-out was performed prior to initiating the procedure. Ultrasound was utilized to confirm patency of the right internal jugular vein. The right neck and chest were prepped with chlorhexidine in a sterile fashion, and a sterile drape was applied covering the operative field. Maximum barrier sterile technique with sterile gowns and gloves were used for the procedure. Local anesthesia was provided with 1% lidocaine. After creating a small venotomy incision, a 21 gauge needle was advanced into the right internal jugular vein under direct, real-time ultrasound guidance. Ultrasound image documentation  was performed. After securing guidewire access, an 8 Fr dilator was placed. A J-wire was kinked to measure appropriate catheter length. A subcutaneous port pocket was then created along the upper chest wall utilizing sharp and blunt dissection. Portable cautery was utilized. The pocket was irrigated with sterile saline. A single lumen power injectable port was chosen for placement. The 8 Fr catheter was tunneled from the port pocket site to the venotomy incision. The port was placed in the pocket. External catheter was trimmed to appropriate length based on guidewire measurement. At the venotomy, an 8 Fr peel-away sheath was placed over a guidewire. The catheter was then placed through the sheath and the sheath removed. Final catheter positioning was confirmed and documented with a fluoroscopic spot image. The  port was accessed with a needle and aspirated and flushed with heparinized saline. The access needle was removed. The venotomy and port pocket incisions were closed with subcutaneous 3-0 Monocryl and subcuticular 4-0 Vicryl. Dermabond was applied to both incisions. COMPLICATIONS: COMPLICATIONS None FINDINGS: After catheter placement, the tip lies at the cavo-atrial junction. The catheter aspirates normally and is ready for immediate use. IMPRESSION: Placement of single lumen port a cath via right internal jugular vein. The catheter tip lies at the cavo-atrial junction. A power injectable port a cath was placed and is ready for immediate use. Electronically Signed   By: Aletta Edouard M.D.   On: 10/23/2016 10:42    ASSESSMENT & PLAN:  CLL (chronic lymphocytic leukemia) ([place]) The patient had partial response to recent treatment She is scared to go back to chemotherapy I recommend low-dose daily prednisone at 5 mg and chlorambucil I will get pharmacy to help obtain insurance prior authorization I think reduced dose 2 mg daily chlorambucil along with prednisone might be helpful I recommend her daughter to call me as soon as she has the medication approved and delivered I will see her back within the week of start date of treatment for assessment  Neutropenia ([place]) She has neutropenia likely due to CLL but remain asymptomatic We discussed neutropenic precaution and I educated the patient and her daughter signs and symptoms to watch out for infection  Dysuria She has mild dysuria Urinalysis was grossly abnormal and culture is pending Given her neutropenic state, I will proceed to start her on ciprofloxacin   Anemia due to chronic illness This is likely anemia of chronic disease. The patient denies recent history of bleeding such as epistaxis, hematuria or hematochezia. She is asymptomatic from the anemia. We will observe for now.   Orders Placed This Encounter  Procedures  . Urine Culture     Standing Status:   Future    Number of Occurrences:   1    Standing Expiration Date:   12/18/2017  . Urinalysis, Microscopic - [facility]    Standing Status:   Future    Number of Occurrences:   1    Standing Expiration Date:   12/18/2017   All questions were answered. The patient knows to call the clinic with any problems, questions or concerns. No barriers to learning was detected. I spent 25 minutes counseling the patient face to face. The total time spent in the appointment was 30 minutes and more than 50% was on counseling and review of test results     Heath Lark, MD 11/13/2016 10:26 AM

## 2016-11-13 NOTE — Telephone Encounter (Signed)
-----   Message from Heath Lark, MD sent at 11/13/2016  9:10 AM EDT ----- Regarding: UTI Urinalysis is grossly abnormal Call her daughter I am going to prescribe cipro without waiting for culture results  ----- Message ----- From: Interface, Lab In Three Zero One Sent: 11/13/2016   8:25 AM To: Heath Lark, MD

## 2016-11-13 NOTE — Assessment & Plan Note (Signed)
The patient had partial response to recent treatment She is scared to go back to chemotherapy I recommend low-dose daily prednisone at 5 mg and chlorambucil I will get pharmacy to help obtain insurance prior authorization I think reduced dose 2 mg daily chlorambucil along with prednisone might be helpful I recommend her daughter to call me as soon as she has the medication approved and delivered I will see her back within the week of start date of treatment for assessment

## 2016-11-13 NOTE — Assessment & Plan Note (Signed)
She has mild dysuria Urinalysis was grossly abnormal and culture is pending Given her neutropenic state, I will proceed to start her on ciprofloxacin

## 2016-11-14 NOTE — Telephone Encounter (Signed)
Is there payment assistance? I don't think she can afford that much per month

## 2016-11-14 NOTE — Telephone Encounter (Signed)
[facility] Patient Advocate Encounter  Prior Authorization for Leukeran has been resolved no prior auth was needed.     A test claim revealed that the copay will be $260.67 per month.  O[facility] On[facility] Clinic will be reaching out to the patient today.   [place] Patient Advocate 11/14/2016 10:39 AM

## 2016-11-15 LAB — URINE CULTURE

## 2016-11-17 ENCOUNTER — Other Ambulatory Visit: Payer: Self-pay | Admitting: Hematology and Oncology

## 2016-11-17 ENCOUNTER — Telehealth: Payer: Self-pay

## 2016-11-17 ENCOUNTER — Telehealth: Payer: Self-pay | Admitting: Pharmacist

## 2016-11-17 DIAGNOSIS — C9110 Chronic lymphocytic leukemia of B-cell type not having achieved remission: Secondary | ICD-10-CM

## 2016-11-17 MED ORDER — CHLORAMBUCIL 2 MG PO TABS: 2.0000 mg | ORAL_TABLET | Freq: Every day | ORAL | 1 refills | Status: DC

## 2016-11-17 MED ORDER — CIPROFLOXACIN HCL 250 MG PO TABS: 250.0000 mg | ORAL_TABLET | Freq: Two times a day (BID) | ORAL | 0 refills | Status: DC

## 2016-11-17 NOTE — Telephone Encounter (Signed)
-----   Message from Heath Lark, MD sent at 11/17/2016  6:55 AM EDT ----- Regarding: UTI Can you call daughter? Urinalysis showed UTI She should start cipro ----- Message ----- From: Interface, Lab In Three Zero One Sent: 11/13/2016   8:25 AM To: Heath Lark, MD

## 2016-11-17 NOTE — Telephone Encounter (Signed)
Oral Oncology Pharmacist Encounter  Received new prescription for chlorambucil for the treatment of chronic lymphocytic leukenia in conjunction with prednsione, planned duration until disease progression or unacceptable toxicity.  Labs from 11/13/16 assessed, OK for treatment.  Current medication list in Epic reviewed, no DDIs with chlorambucil identified:  Prescription has been e-scribed to the [facility] for benefits analysis and approval. Copayment $260.67, oral oncology patient advocate to discuss copayment assistance with patient and daughter.  I LVM with daughter, [name], to discuss medication acquisition, copayment issues, initial counseling and start date.  Oral Oncology Clinic will continue to follow.  Johny Drilling, PharmD, BCPS, BCOP 11/17/2016 1:51 PM Oral Oncology Clinic [phone]

## 2016-11-17 NOTE — Telephone Encounter (Signed)
Oral Chemotherapy Pharmacist Encounter   I spoke with patient's daughter, [name], for overview of new oral chemotherapy medication: chlorambucil for the treatment of chronic lymphocytic leukenia in conjunction with prednsione, planned duration until disease progression or unacceptable toxicity.   Counseled [name] on administration, dosing, side effects, safe handling, and monitoring. Patient will take chlorambucil 41m tablets, 1 tablet by mouth once daily on an empty stomach, at least 1 hr before or 2 hours after a meal. GGibraltarstated her mother will take her chlorambucil 1st thing in the morning and then eat 1 hour later.  Side effects include but not limited to: decreased blood counts, secondary malignancy, drug fever, peripheral neuropathy, and infrequent GI upset.    Reviewed with patient importance of keeping a medication schedule and plan for any missed doses.  GGibraltarvoiced understanding and appreciation.   All questions answered.  Chlorambucil had to be ordered at the pharmacy and will be ready to pack for shipment to patient's home for delivery ~8/16. Patient will be signed up for copayment assistance and will be updated in a separate encounter.  GGibraltarknows to call the office with questions or concerns. Oral Oncology Clinic will continue to follow.  Thank you,  JJohny Drilling PharmD, BCPS, BCOP 11/17/2016  4:32 PM Oral Oncology Clinic [phone]

## 2016-11-17 NOTE — Telephone Encounter (Signed)
Called with below message, instructed to call for questions.

## 2016-11-17 NOTE — Telephone Encounter (Signed)
Copayment assistance is currently available for CLL ($7600 from [facility]).  I will reach out to the patient today and apply for grant funding to attempt to cover her copayments.     Fabio Asa. Melynda Keller, [name] Patient [place] [phone] 11/17/2016 8:37 AM

## 2016-11-18 ENCOUNTER — Telehealth: Payer: Self-pay | Admitting: Pharmacy Technician

## 2016-11-18 MED FILL — LEUKERAN 2 MG TABLET: 2 | 25 days supply | Qty: 25 | Fill #0

## 2016-11-18 NOTE — Telephone Encounter (Signed)
Called with below message, left on voicemail. Ask daughter to call the nurse back.

## 2016-11-18 NOTE — Telephone Encounter (Signed)
Carla Conway, can you verify with her daughter that she is OK with the co-pay? If yes, proceed to start first dose on 8/16. We will keep her appt as scheduled.  Also, cancel flush appt on 8/22 and chemo on 8/22 & 8/23, keep labs and appt to see me

## 2016-11-18 NOTE — Telephone Encounter (Signed)
Oral Oncology Patient Advocate Encounter  Was successful in securing patient an $69 grant from Patient [facility]) to provide copayment coverage for her Leukeran.  This will keep the out of pocket expense at $0.    The billing information is as follows and has been shared with [place].   Member ID: [id number] Group ID: [id number] RxBin: [id number] Dates of Eligibility: 08/20/2016 through 11/17/2017  Fabio Asa. Melynda Keller, [place] Patient [place] [phone] 11/18/2016 8:29 AM

## 2016-11-20 ENCOUNTER — Telehealth: Payer: Self-pay

## 2016-11-20 NOTE — Telephone Encounter (Signed)
Spoke with daughter and clarified appts for 8-22.

## 2016-11-20 NOTE — Telephone Encounter (Signed)
Called and left below message. Instructed to call for questions. 

## 2016-11-20 NOTE — Telephone Encounter (Signed)
[name] called to clarify time of next appt. LVM 8/22 at [place] lab and 0930 MD

## 2016-11-26 ENCOUNTER — Encounter: Payer: Self-pay | Admitting: Hematology and Oncology

## 2016-11-26 ENCOUNTER — Ambulatory Visit (HOSPITAL_BASED_OUTPATIENT_CLINIC_OR_DEPARTMENT_OTHER): Payer: Medicare Other | Admitting: Hematology and Oncology

## 2016-11-26 ENCOUNTER — Other Ambulatory Visit (HOSPITAL_BASED_OUTPATIENT_CLINIC_OR_DEPARTMENT_OTHER): Payer: Medicare Other

## 2016-11-26 ENCOUNTER — Telehealth: Payer: Self-pay | Admitting: Hematology and Oncology

## 2016-11-26 ENCOUNTER — Ambulatory Visit: Payer: Medicare Other

## 2016-11-26 DIAGNOSIS — C9110 Chronic lymphocytic leukemia of B-cell type not having achieved remission: Secondary | ICD-10-CM | POA: Diagnosis not present

## 2016-11-26 DIAGNOSIS — D649 Anemia, unspecified: Secondary | ICD-10-CM

## 2016-11-26 DIAGNOSIS — C8300 Small cell B-cell lymphoma, unspecified site: Secondary | ICD-10-CM

## 2016-11-26 DIAGNOSIS — Z7189 Other specified counseling: Secondary | ICD-10-CM

## 2016-11-26 DIAGNOSIS — D638 Anemia in other chronic diseases classified elsewhere: Secondary | ICD-10-CM

## 2016-11-26 LAB — CBC WITH DIFFERENTIAL/PLATELET
BASO%: 0.2 % (ref 0.0–2.0)
BASOS ABS: 0.1 10*3/uL (ref 0.0–0.1)
EOS%: 0.3 % (ref 0.0–7.0)
Eosinophils Absolute: 0.2 10*3/uL (ref 0.0–0.5)
HCT: 34.2 % — ABNORMAL LOW (ref 34.8–46.6)
HEMOGLOBIN: 11.1 g/dL — AB (ref 11.6–15.9)
LYMPH#: 47.3 10*3/uL — AB (ref 0.9–3.3)
LYMPH%: 97.2 % — AB (ref 14.0–49.7)
MCH: 30.4 pg (ref 25.1–34.0)
MCHC: 32.6 g/dL (ref 31.5–36.0)
MCV: 93.2 fL (ref 79.5–101.0)
MONO#: 0.3 10*3/uL (ref 0.1–0.9)
MONO%: 0.6 % (ref 0.0–14.0)
NEUT%: 1.7 % — AB (ref 38.4–76.8)
NEUTROS ABS: 0.8 10*3/uL — AB (ref 1.5–6.5)
PLATELETS: 329 10*3/uL (ref 145–400)
RBC: 3.67 10*6/uL — ABNORMAL LOW (ref 3.70–5.45)
RDW: 16.7 % — ABNORMAL HIGH (ref 11.2–14.5)
WBC: 48.7 10*3/uL — ABNORMAL HIGH (ref 3.9–10.3)

## 2016-11-26 LAB — COMPREHENSIVE METABOLIC PANEL
ALBUMIN: 3.4 g/dL — AB (ref 3.5–5.0)
ALT: 7 U/L (ref 0–55)
AST: 11 U/L (ref 5–34)
Alkaline Phosphatase: 93 U/L (ref 40–150)
Anion Gap: 8 mEq/L (ref 3–11)
BILIRUBIN TOTAL: 0.59 mg/dL (ref 0.20–1.20)
BUN: 21.6 mg/dL (ref 7.0–26.0)
CO2: 28 meq/L (ref 22–29)
CREATININE: 1.1 mg/dL (ref 0.6–1.1)
Calcium: 9.3 mg/dL (ref 8.4–10.4)
Chloride: 100 mEq/L (ref 98–109)
EGFR: 47 mL/min/{1.73_m2} — AB (ref >90)
GLUCOSE: 119 mg/dL (ref 70–140)
Potassium: 3.9 mEq/L (ref 3.5–5.1)
SODIUM: 135 meq/L — AB (ref 136–145)
TOTAL PROTEIN: 6.4 g/dL (ref 6.4–8.3)

## 2016-11-26 LAB — TECHNOLOGIST REVIEW

## 2016-11-26 LAB — URIC ACID: URIC ACID, SERUM: 3 mg/dL (ref 2.6–7.4)

## 2016-11-26 NOTE — Assessment & Plan Note (Signed)
This is likely anemia of chronic disease. The patient denies recent history of bleeding such as epistaxis, hematuria or hematochezia. She is asymptomatic from the anemia. We will observe for now.

## 2016-11-26 NOTE — Assessment & Plan Note (Signed)
The patient has generalized deconditioning I encouraged her increase activity as tolerated She carries a very pessimistic view and have thought frequently and worried about dying or she will not respond to treatment I spent a lot of time encouraging the patient and tries to view her outcome in a positive manner While the treatment is not curative, she has responded to to treatment in positive ways

## 2016-11-26 NOTE — Assessment & Plan Note (Signed)
Her prior chemotherapy was terminated due to poor tolerance to treatment With low-dose prednisone 5 mg daily and chlorambucil, she appears to be feeling well Total white count is improving I recommend she continues treatment 1  week on and 1 week off I plan to see her back in 2 weeks to repeat review test results and assess for toxicity I encouraged her to increase exercise activity as tolerated

## 2016-11-26 NOTE — Telephone Encounter (Signed)
Scheduled appt per 8/22 los - Gave patient AVS and calender per los.

## 2016-11-26 NOTE — Progress Notes (Signed)
[place] OFFICE PROGRESS NOTE  Patient Care Team: Koirala, Dibas, MD as PCP - General (Family Medicine)  SUMMARY OF ONCOLOGIC HISTORY:   CLL (chronic lymphocytic leukemia) ([place])   05/02/2013 Initial Diagnosis    She was found to have abnormal CBC from at least since 2015. According to the records from her primary care doctor that were faxed to the [facility], she had leukocytosis at least since January 2015. On 05/02/2013, total white blood cell count was 28.3, normal hemoglobin 13.4 and platelet count 239,000. She had predominantly Lymphocytosis. On 06/20/2013, a CBC show white count of 26.7, hemoglobin 12.9 and platelet count of 299,000 On admission, on 05/08/2016, white blood cell count was 38.4, hemoglobin 12.1 and platelet count of 210. Differential show predominantly lymphocytosis.      05/08/2016 Imaging    CT scan of chest 1. Negative for acute pulmonary embolism 2. Numerous pathologic nodes in the mediastinum, hila, axillae, supraclavicular regions and upper abdomen. The adenopathy is suspicious for malignancy such as lymphoma or CLL. If tissue diagnosis is desired, many of the supraclavicular and axillary nodes should be palpable and accessible to excisional biopsy. 3. Low-attenuation liver lesions, incompletely imaged and not characterized but more likely benign. 4. Small hiatal hernia.      09/22/2016 Pathology Results    Peripheral Blood Flow Cytometry - CHRONIC LYMPHOCYTIC LEUKEMIA. - SEE COMMENT. Diagnosis Comment: There is a population of monoclonal B-cells with expression of CD5 and CD23. The phenotype is consistent with chronic lymphocytic leukemia      09/22/2016 Pathology Results    FISH confirmed trisomy 12      10/21/2016 PET scan    1. Extensive adenopathy in the neck, chest, abdomen, and pelvis as detailed above, primarily Deauville 3 and Deauville 4. Mild splenomegaly. 2. High activity in some soft tissue prominence along the anal region,  possibly physiologic or due to hemorrhoidal activity or urinary incontinence, but correlation with anorectal exam recommended. 3. Focal abnormal accentuated high activity in the sigmoid colon along a region of abnormal stranding and diverticulosis, suspicious for active diverticulitis. Correlation with the patient's colon cancer screening history is recommended. If screening is not up-to-date, appropriate screening should be considered. 4. Aortic Atherosclerosis (ICD10-I70.0). Coronary atherosclerosis. 5. An exophytic high density lesion from the right kidney upper pole does not appear to be hypermetabolic and accordingly is most likely to be a benign complex cyst, but this lesion is not fully characterized on today' s exam. If the patient has hematuria or if otherwise clinically warranted, renal protocol MRI or CT examination could be utilized. 6. Some of the smaller hepatic lesions are nonspecific, but the larger hepatic lesions are photopenic and likely cysts.      10/23/2016 Procedure    Placement of single lumen port a cath via right internal jugular vein. The catheter tip lies at the cavo-atrial junction. A power injectable port a cath was placed and is ready for immediate use      10/29/2016 - 10/30/2016 Hospital Admission    She was admitted after allergic reaction to Rituximab      10/29/2016 Adverse Reaction    She received Rituximab complicated by allergic reaction. Treatment was abandoned      11/20/2016 -  Chemotherapy    The patient is started on chlorambucil and low dose prednisone       INTERVAL HISTORY: Please see below for problem oriented charting. She returns with her daughter for further follow-up She tolerated chemotherapy well No new lymphadenopathy  Appetite is stable She complained of occasional insomnia She is worried that she may not continue to respond to treatment even though CBC today is showing positive response to treatment The patient denies any recent  signs or symptoms of bleeding such as spontaneous epistaxis, hematuria or hematochezia.  REVIEW OF SYSTEMS:   Constitutional: Denies fevers, chills or abnormal weight loss Eyes: Denies blurriness of vision Ears, nose, mouth, throat, and face: Denies mucositis or sore throat Respiratory: Denies cough, dyspnea or wheezes Cardiovascular: Denies palpitation, chest discomfort or lower extremity swelling Gastrointestinal:  Denies nausea, heartburn or change in bowel habits Skin: Denies abnormal skin rashes Lymphatics: Denies new lymphadenopathy or easy bruising Neurological:Denies numbness, tingling or new weaknesses Behavioral/Psych: Mood is stable, no new changes  All other systems were reviewed with the patient and are negative.  I have reviewed the past medical history, past surgical history, social history and family history with the patient and they are unchanged from previous note.  ALLERGIES:  is allergic to rituximab.  MEDICATIONS:  Current Outpatient Prescriptions  Medication Sig Dispense Refill  . acetaminophen (TYLENOL) 325 MG tablet Take 650 mg by mouth every 6 (six) hours as needed for mild pain.    Marland Kitchen allopurinol (ZYLOPRIM) 300 MG tablet TAKE 1 TABLET(300 MG) BY MOUTH DAILY 30 tablet 0  . amLODipine (NORVASC) 10 MG tablet TAKE 1 TABLET(10 MG) BY MOUTH DAILY 30 tablet 0  . aspirin EC 81 MG tablet Take 81 mg by mouth daily.    . chlorambucil (LEUKERAN) 2 MG tablet Take 1 tablet (2 mg total) by mouth daily. Give on an empty stomach 1 hour before or 2 hours after meals. 30 tablet 1  . COMBIVENT RESPIMAT 20-100 MCG/ACT AERS respimat Take 2 puffs by mouth every 6 (six) hours as needed for wheezing or shortness of breath.     . isosorbide mononitrate (IMDUR) 30 MG 24 hr tablet Take 30 mg by mouth daily.     Marland Kitchen lidocaine-prilocaine (EMLA) cream Apply 1 application topically as needed. (Patient taking differently: Apply 1 application topically as needed (For port-a-cath.). ) 30 g 6  .  lovastatin (MEVACOR) 40 MG tablet Take 40 mg by mouth at bedtime.     . montelukast (SINGULAIR) 10 MG tablet Take 10 mg by mouth at bedtime.     . ondansetron (ZOFRAN) 8 MG tablet Take 1 tablet (8 mg total) by mouth every 8 (eight) hours as needed for nausea. 30 tablet 3  . potassium chloride (K-DUR) 10 MEQ tablet Take 10 mEq by mouth daily.    . predniSONE (DELTASONE) 5 MG tablet Take 1 tablet (5 mg total) by mouth daily with breakfast. 60 tablet 1  . promethazine (PHENERGAN) 12.5 MG tablet Take 1 tablet (12.5 mg total) by mouth every 6 (six) hours as needed for nausea. 30 tablet 3  . QUEtiapine (SEROQUEL) 25 MG tablet Take 12.5 mg by mouth at bedtime.      No current facility-administered medications for this visit.     PHYSICAL EXAMINATION: ECOG PERFORMANCE STATUS: 1 - Symptomatic but completely ambulatory  Vitals:   11/26/16 0901  BP: (!) 157/65  Pulse: 92  Resp: 17  Temp: 98.2 F (36.8 C)  SpO2: 97%   Filed Weights   11/26/16 0901  Weight: 148 lb 1.6 oz (67.2 kg)    GENERAL:alert, no distress and comfortable SKIN: skin color, texture, turgor are normal, no rashes or significant lesions EYES: normal, Conjunctiva are pink and non-injected, sclera clear OROPHARYNX:no exudate, no erythema  and lips, buccal mucosa, and tongue normal  NECK: supple, thyroid normal size, non-tender, without nodularity LYMPH:  no palpable lymphadenopathy in the cervical, axillary or inguinal LUNGS: clear to auscultation and percussion with normal breathing effort HEART: regular rate & rhythm and no murmurs and no lower extremity edema ABDOMEN:abdomen soft, non-tender and normal bowel sounds Musculoskeletal:no cyanosis of digits and no clubbing  NEURO: alert & oriented x 3 with fluent speech, no focal motor/sensory deficits  LABORATORY DATA:  I have reviewed the data as listed    Component Value Date/Time   NA 135 (L) 11/26/2016 0853   K 3.9 11/26/2016 0853   CL 102 10/30/2016 0409   CO2 28  11/26/2016 0853   GLUCOSE 119 11/26/2016 0853   BUN 21.6 11/26/2016 0853   CREATININE 1.1 11/26/2016 0853   CALCIUM 9.3 11/26/2016 0853   PROT 6.4 11/26/2016 0853   ALBUMIN 3.4 (L) 11/26/2016 0853   AST 11 11/26/2016 0853   ALT 7 11/26/2016 0853   ALKPHOS 93 11/26/2016 0853   BILITOT 0.59 11/26/2016 0853   GFRNONAA 49 (L) 10/30/2016 0409   GFRAA 57 (L) 10/30/2016 0409    No results found for: SPEP, UPEP  Lab Results  Component Value Date   WBC 48.7 (H) 11/26/2016   NEUTROABS 0.8 (L) 11/26/2016   HGB 11.1 (L) 11/26/2016   HCT 34.2 (L) 11/26/2016   MCV 93.2 11/26/2016   PLT 329 11/26/2016      Chemistry      Component Value Date/Time   NA 135 (L) 11/26/2016 0853   K 3.9 11/26/2016 0853   CL 102 10/30/2016 0409   CO2 28 11/26/2016 0853   BUN 21.6 11/26/2016 0853   CREATININE 1.1 11/26/2016 0853      Component Value Date/Time   CALCIUM 9.3 11/26/2016 0853   ALKPHOS 93 11/26/2016 0853   AST 11 11/26/2016 0853   ALT 7 11/26/2016 0853   BILITOT 0.59 11/26/2016 0853       RADIOGRAPHIC STUDIES: I have personally reviewed the radiological images as listed and agreed with the findings in the report. Dg Chest Port 1 View  Result Date: 10/29/2016 CLINICAL DATA:  Reduced level of consciousness this morning while receiving chemotherapy infusion, anaphylactoid reaction to chemotherapy, chest soreness post compressions, history hypertension, asthma, coronary artery disease, COPD, CHF, small lymphocytic lymphoma EXAM: PORTABLE CHEST 1 VIEW COMPARISON:  Portable exam at 1009 hours compared to 05/08/2016 FINDINGS: RIGHT jugular Port-A-Cath with tip projecting over SVC. Normal heart size, mediastinal contours, and pulmonary vascularity. Atherosclerotic calcification and mild tortuosity of thoracic aorta. Bronchitic changes with minimal chronic bibasilar atelectasis. No acute infiltrate, pleural effusion or pneumothorax. Bones demineralized. IMPRESSION: Bronchitic changes with chronic  bibasilar atelectasis. No acute abnormalities. Aortic Atherosclerosis (ICD10-I70.0). Electronically Signed   By: Lavonia Dana M.D.   On: 10/29/2016 10:24    ASSESSMENT & PLAN:  CLL (chronic lymphocytic leukemia) ([facility]) Her prior chemotherapy was terminated due to poor tolerance to treatment With low-dose prednisone 5 mg daily and chlorambucil, she appears to be feeling well Total white count is improving I recommend she continues treatment 1  week on and 1 week off I plan to see her back in 2 weeks to repeat review test results and assess for toxicity I encouraged her to increase exercise activity as tolerated  Anemia due to chronic illness This is likely anemia of chronic disease. The patient denies recent history of bleeding such as epistaxis, hematuria or hematochezia. She is asymptomatic from the anemia. We  will observe for now.  Goals of care, counseling/discussion The patient has generalized deconditioning I encouraged her increase activity as tolerated She carries a very pessimistic view and have thought frequently and worried about dying or she will not respond to treatment I spent a lot of time encouraging the patient and tries to view her outcome in a positive manner While the treatment is not curative, she has responded to to treatment in positive ways   No orders of the defined types were placed in this encounter.  All questions were answered. The patient knows to call the clinic with any problems, questions or concerns. No barriers to learning was detected. I spent 15 minutes counseling the patient face to face. The total time spent in the appointment was 20 minutes and more than 50% was on counseling and review of test results     Heath Lark, MD 11/26/2016 11:29 AM

## 2016-11-27 ENCOUNTER — Ambulatory Visit: Payer: Medicare Other

## 2016-11-27 ENCOUNTER — Other Ambulatory Visit: Payer: Self-pay | Admitting: Hematology and Oncology

## 2016-12-10 ENCOUNTER — Encounter: Payer: Self-pay | Admitting: Hematology and Oncology

## 2016-12-10 ENCOUNTER — Other Ambulatory Visit (HOSPITAL_BASED_OUTPATIENT_CLINIC_OR_DEPARTMENT_OTHER): Payer: Medicare Other

## 2016-12-10 ENCOUNTER — Telehealth: Payer: Self-pay | Admitting: Hematology and Oncology

## 2016-12-10 ENCOUNTER — Ambulatory Visit (HOSPITAL_BASED_OUTPATIENT_CLINIC_OR_DEPARTMENT_OTHER): Payer: Medicare Other | Admitting: Hematology and Oncology

## 2016-12-10 DIAGNOSIS — C9110 Chronic lymphocytic leukemia of B-cell type not having achieved remission: Secondary | ICD-10-CM | POA: Diagnosis not present

## 2016-12-10 DIAGNOSIS — N183 Chronic kidney disease, stage 3 unspecified: Secondary | ICD-10-CM

## 2016-12-10 DIAGNOSIS — I10 Essential (primary) hypertension: Secondary | ICD-10-CM

## 2016-12-10 DIAGNOSIS — D649 Anemia, unspecified: Secondary | ICD-10-CM | POA: Diagnosis not present

## 2016-12-10 DIAGNOSIS — C8300 Small cell B-cell lymphoma, unspecified site: Secondary | ICD-10-CM

## 2016-12-10 DIAGNOSIS — R413 Other amnesia: Secondary | ICD-10-CM

## 2016-12-10 DIAGNOSIS — D638 Anemia in other chronic diseases classified elsewhere: Secondary | ICD-10-CM

## 2016-12-10 LAB — COMPREHENSIVE METABOLIC PANEL
ALT: 10 U/L (ref 0–55)
AST: 13 U/L (ref 5–34)
Albumin: 3.9 g/dL (ref 3.5–5.0)
Alkaline Phosphatase: 76 U/L (ref 40–150)
Anion Gap: 10 mEq/L (ref 3–11)
BUN: 27.3 mg/dL — ABNORMAL HIGH (ref 7.0–26.0)
CALCIUM: 9.9 mg/dL (ref 8.4–10.4)
CHLORIDE: 101 meq/L (ref 98–109)
CO2: 28 meq/L (ref 22–29)
CREATININE: 1.2 mg/dL — AB (ref 0.6–1.1)
EGFR: 42 mL/min/{1.73_m2} — ABNORMAL LOW (ref >90)
Glucose: 91 mg/dl (ref 70–140)
Potassium: 4.4 mEq/L (ref 3.5–5.1)
Sodium: 139 mEq/L (ref 136–145)
TOTAL PROTEIN: 6.7 g/dL (ref 6.4–8.3)
Total Bilirubin: 0.53 mg/dL (ref 0.20–1.20)

## 2016-12-10 LAB — CBC WITH DIFFERENTIAL/PLATELET
BASO%: 0.5 % (ref 0.0–2.0)
BASOS ABS: 0.3 10*3/uL — AB (ref 0.0–0.1)
EOS ABS: 0 10*3/uL (ref 0.0–0.5)
EOS%: 0 % (ref 0.0–7.0)
HCT: 35.8 % (ref 34.8–46.6)
HGB: 11.5 g/dL — ABNORMAL LOW (ref 11.6–15.9)
LYMPH%: 97.5 % — AB (ref 14.0–49.7)
MCH: 30.3 pg (ref 25.1–34.0)
MCHC: 32 g/dL (ref 31.5–36.0)
MCV: 94.6 fL (ref 79.5–101.0)
MONO#: 0.6 10*3/uL (ref 0.1–0.9)
MONO%: 1 % (ref 0.0–14.0)
NEUT#: 0.6 10*3/uL — ABNORMAL LOW (ref 1.5–6.5)
NEUT%: 1 % — ABNORMAL LOW (ref 38.4–76.8)
Platelets: 283 10*3/uL (ref 145–400)
RBC: 3.79 10*6/uL (ref 3.70–5.45)
RDW: 17.5 % — ABNORMAL HIGH (ref 11.2–14.5)
WBC: 59 10*3/uL (ref 3.9–10.3)
lymph#: 57.6 10*3/uL — ABNORMAL HIGH (ref 0.9–3.3)

## 2016-12-10 LAB — TECHNOLOGIST REVIEW

## 2016-12-10 LAB — URIC ACID: URIC ACID, SERUM: 3.4 mg/dL (ref 2.6–7.4)

## 2016-12-10 MED FILL — LEUKERAN 2 MG TABLET: 2 | 25 days supply | Qty: 25 | Fill #1

## 2016-12-10 NOTE — Telephone Encounter (Signed)
Scheduled appt per 9/5 los - Gave patient AVS and calender per los.

## 2016-12-11 DIAGNOSIS — R413 Other amnesia: Secondary | ICD-10-CM | POA: Insufficient documentation

## 2016-12-11 NOTE — Progress Notes (Signed)
[place] OFFICE PROGRESS NOTE  Patient Care Team: Koirala, Dibas, MD as PCP - General (Family Medicine)  SUMMARY OF ONCOLOGIC HISTORY:   CLL (chronic lymphocytic leukemia) ([place])   05/02/2013 Initial Diagnosis    She was found to have abnormal CBC from at least since 2015. According to the records from her primary care doctor that were faxed to the [facility], she had leukocytosis at least since January 2015. On 05/02/2013, total white blood cell count was 28.3, normal hemoglobin 13.4 and platelet count 239,000. She had predominantly Lymphocytosis. On 06/20/2013, a CBC show white count of 26.7, hemoglobin 12.9 and platelet count of 299,000 On admission, on 05/08/2016, white blood cell count was 38.4, hemoglobin 12.1 and platelet count of 210. Differential show predominantly lymphocytosis.      05/08/2016 Imaging    CT scan of chest 1. Negative for acute pulmonary embolism 2. Numerous pathologic nodes in the mediastinum, hila, axillae, supraclavicular regions and upper abdomen. The adenopathy is suspicious for malignancy such as lymphoma or CLL. If tissue diagnosis is desired, many of the supraclavicular and axillary nodes should be palpable and accessible to excisional biopsy. 3. Low-attenuation liver lesions, incompletely imaged and not characterized but more likely benign. 4. Small hiatal hernia.      09/22/2016 Pathology Results    Peripheral Blood Flow Cytometry - CHRONIC LYMPHOCYTIC LEUKEMIA. - SEE COMMENT. Diagnosis Comment: There is a population of monoclonal B-cells with expression of CD5 and CD23. The phenotype is consistent with chronic lymphocytic leukemia      09/22/2016 Pathology Results    FISH confirmed trisomy 12      10/21/2016 PET scan    1. Extensive adenopathy in the neck, chest, abdomen, and pelvis as detailed above, primarily Deauville 3 and Deauville 4. Mild splenomegaly. 2. High activity in some soft tissue prominence along the anal region,  possibly physiologic or due to hemorrhoidal activity or urinary incontinence, but correlation with anorectal exam recommended. 3. Focal abnormal accentuated high activity in the sigmoid colon along a region of abnormal stranding and diverticulosis, suspicious for active diverticulitis. Correlation with the patient's colon cancer screening history is recommended. If screening is not up-to-date, appropriate screening should be considered. 4. Aortic Atherosclerosis (ICD10-I70.0). Coronary atherosclerosis. 5. An exophytic high density lesion from the right kidney upper pole does not appear to be hypermetabolic and accordingly is most likely to be a benign complex cyst, but this lesion is not fully characterized on today' s exam. If the patient has hematuria or if otherwise clinically warranted, renal protocol MRI or CT examination could be utilized. 6. Some of the smaller hepatic lesions are nonspecific, but the larger hepatic lesions are photopenic and likely cysts.      10/23/2016 Procedure    Placement of single lumen port a cath via right internal jugular vein. The catheter tip lies at the cavo-atrial junction. A power injectable port a cath was placed and is ready for immediate use      10/29/2016 - 10/30/2016 Hospital Admission    She was admitted after allergic reaction to Rituximab      10/29/2016 Adverse Reaction    She received Rituximab complicated by allergic reaction. Treatment was abandoned      11/20/2016 -  Chemotherapy    The patient is started on chlorambucil and low dose prednisone       INTERVAL HISTORY: Please see below for problem oriented charting. She returns for further follow-up She feels well She denies new lymphadenopathy No recent fever,  chills or cough to suggest evidence of infection The patient may not have remember taking her medication correctly as directed. She restarted chlorambucil last week after 1 week break.  REVIEW OF SYSTEMS:   Constitutional:  Denies fevers, chills or abnormal weight loss Eyes: Denies blurriness of vision Ears, nose, mouth, throat, and face: Denies mucositis or sore throat Respiratory: Denies cough, dyspnea or wheezes Cardiovascular: Denies palpitation, chest discomfort or lower extremity swelling Gastrointestinal:  Denies nausea, heartburn or change in bowel habits Skin: Denies abnormal skin rashes Lymphatics: Denies new lymphadenopathy or easy bruising Neurological:Denies numbness, tingling or new weaknesses Behavioral/Psych: Mood is stable, no new changes  All other systems were reviewed with the patient and are negative.  I have reviewed the past medical history, past surgical history, social history and family history with the patient and they are unchanged from previous note.  ALLERGIES:  is allergic to rituximab.  MEDICATIONS:  Current Outpatient Prescriptions  Medication Sig Dispense Refill  . acetaminophen (TYLENOL) 325 MG tablet Take 650 mg by mouth every 6 (six) hours as needed for mild pain.    Marland Kitchen allopurinol (ZYLOPRIM) 300 MG tablet TAKE 1 TABLET(300 MG) BY MOUTH DAILY 30 tablet 0  . amLODipine (NORVASC) 10 MG tablet TAKE 1 TABLET(10 MG) BY MOUTH DAILY 30 tablet 0  . aspirin EC 81 MG tablet Take 81 mg by mouth daily.    . chlorambucil (LEUKERAN) 2 MG tablet Take 1 tablet (2 mg total) by mouth daily. Give on an empty stomach 1 hour before or 2 hours after meals. 30 tablet 1  . COMBIVENT RESPIMAT 20-100 MCG/ACT AERS respimat Take 2 puffs by mouth every 6 (six) hours as needed for wheezing or shortness of breath.     . isosorbide mononitrate (IMDUR) 30 MG 24 hr tablet Take 30 mg by mouth daily.     Marland Kitchen lidocaine-prilocaine (EMLA) cream Apply 1 application topically as needed. (Patient taking differently: Apply 1 application topically as needed (For port-a-cath.). ) 30 g 6  . lovastatin (MEVACOR) 40 MG tablet Take 40 mg by mouth at bedtime.     . montelukast (SINGULAIR) 10 MG tablet Take 10 mg by mouth at  bedtime.     . ondansetron (ZOFRAN) 8 MG tablet Take 1 tablet (8 mg total) by mouth every 8 (eight) hours as needed for nausea. 30 tablet 3  . potassium chloride (K-DUR) 10 MEQ tablet Take 10 mEq by mouth daily.    . predniSONE (DELTASONE) 5 MG tablet Take 1 tablet (5 mg total) by mouth daily with breakfast. 60 tablet 1  . promethazine (PHENERGAN) 12.5 MG tablet Take 1 tablet (12.5 mg total) by mouth every 6 (six) hours as needed for nausea. 30 tablet 3  . QUEtiapine (SEROQUEL) 25 MG tablet Take 12.5 mg by mouth at bedtime.      No current facility-administered medications for this visit.     PHYSICAL EXAMINATION: ECOG PERFORMANCE STATUS: 1 - Symptomatic but completely ambulatory  Vitals:   12/10/16 0905  BP: (!) 161/57  Pulse: 75  Resp: 17  Temp: 99 F (37.2 C)  SpO2: 94%   Filed Weights   12/10/16 0905  Weight: 148 lb 11.2 oz (67.4 kg)    GENERAL:alert, no distress and comfortable SKIN: skin color, texture, turgor are normal, no rashes or significant lesions EYES: normal, Conjunctiva are pink and non-injected, sclera clear OROPHARYNX:no exudate, no erythema and lips, buccal mucosa, and tongue normal  NECK: supple, thyroid normal size, non-tender, without nodularity LYMPH: She  has subtle persistent palpable lymphadenopathy in her neck.   LUNGS: clear to auscultation and percussion with normal breathing effort HEART: regular rate & rhythm and no murmurs and no lower extremity edema ABDOMEN:abdomen soft, non-tender and normal bowel sounds Musculoskeletal:no cyanosis of digits and no clubbing  NEURO: alert & oriented x 3 with fluent speech, no focal motor/sensory deficits.  She appears to have poor memory  LABORATORY DATA:  I have reviewed the data as listed    Component Value Date/Time   NA 139 12/10/2016 0840   K 4.4 12/10/2016 0840   CL 102 10/30/2016 0409   CO2 28 12/10/2016 0840   GLUCOSE 91 12/10/2016 0840   BUN 27.3 (H) 12/10/2016 0840   CREATININE 1.2 (H)  12/10/2016 0840   CALCIUM 9.9 12/10/2016 0840   PROT 6.7 12/10/2016 0840   ALBUMIN 3.9 12/10/2016 0840   AST 13 12/10/2016 0840   ALT 10 12/10/2016 0840   ALKPHOS 76 12/10/2016 0840   BILITOT 0.53 12/10/2016 0840   GFRNONAA 49 (L) 10/30/2016 0409   GFRAA 57 (L) 10/30/2016 0409    No results found for: SPEP, UPEP  Lab Results  Component Value Date   WBC 59.0 (HH) 12/10/2016   NEUTROABS 0.6 (L) 12/10/2016   HGB 11.5 (L) 12/10/2016   HCT 35.8 12/10/2016   MCV 94.6 12/10/2016   PLT 283 12/10/2016      Chemistry      Component Value Date/Time   NA 139 12/10/2016 0840   K 4.4 12/10/2016 0840   CL 102 10/30/2016 0409   CO2 28 12/10/2016 0840   BUN 27.3 (H) 12/10/2016 0840   CREATININE 1.2 (H) 12/10/2016 0840      Component Value Date/Time   CALCIUM 9.9 12/10/2016 0840   ALKPHOS 76 12/10/2016 0840   AST 13 12/10/2016 0840   ALT 10 12/10/2016 0840   BILITOT 0.53 12/10/2016 0840       ASSESSMENT & PLAN:  CLL (chronic lymphocytic leukemia) ([place]) The patient has mild worsening disease control with increase lymphocytosis. According to her daughter, the patient has been quite forgetful and may not have remembered taking her medications as directed She just started her treatment last week but it is not clear whether she was taking medication correctly Due to good tolerance to treatment, I recommend modification of her dosing. I recommend she take 2 mg of chlorambucil daily for 14 days, rest 7 days Her next cycle will begin on 01/03/2017 and I plan to see her back within 2 weeks afterwards for further assessment and review of toxicity.  Anemia due to chronic illness This is likely anemia of chronic disease. The patient denies recent history of bleeding such as epistaxis, hematuria or hematochezia. She is asymptomatic from the anemia. We will observe for now.  Chronic kidney disease, stage III (moderate) She has baseline reduced kidney function I recommend aggressive hydration  and discontinuation of diuretic therapy in a short-term  HTN (hypertension) She has fluctuation of her blood pressure If her blood pressure remain elevated in the next visit, I will adjust her blood pressure medication further.  Poor memory She has very poor memory We discussed the risk and benefit getting home health nurse to assess for medication Ultimately, her daughter has decided that she will call the patient daily to remind her to take her medication as directed.   No orders of the defined types were placed in this encounter.  All questions were answered. The patient knows to call the clinic with  any problems, questions or concerns. No barriers to learning was detected. I spent 20 minutes counseling the patient face to face. The total time spent in the appointment was 25 minutes and more than 50% was on counseling and review of test results     Heath Lark, MD 12/11/2016 7:04 AM

## 2016-12-11 NOTE — Assessment & Plan Note (Signed)
She has baseline reduced kidney function I recommend aggressive hydration and discontinuation of diuretic therapy in a short-term

## 2016-12-11 NOTE — Assessment & Plan Note (Signed)
She has fluctuation of her blood pressure If her blood pressure remain elevated in the next visit, I will adjust her blood pressure medication further.

## 2016-12-11 NOTE — Assessment & Plan Note (Signed)
This is likely anemia of chronic disease. The patient denies recent history of bleeding such as epistaxis, hematuria or hematochezia. She is asymptomatic from the anemia. We will observe for now.

## 2016-12-11 NOTE — Assessment & Plan Note (Signed)
The patient has mild worsening disease control with increase lymphocytosis. According to her daughter, the patient has been quite forgetful and may not have remembered taking her medications as directed She just started her treatment last week but it is not clear whether she was taking medication correctly Due to good tolerance to treatment, I recommend modification of her dosing. I recommend she take 2 mg of chlorambucil daily for 14 days, rest 7 days Her next cycle will begin on 01/03/2017 and I plan to see her back within 2 weeks afterwards for further assessment and review of toxicity.

## 2016-12-11 NOTE — Assessment & Plan Note (Signed)
She has very poor memory We discussed the risk and benefit getting home health nurse to assess for medication Ultimately, her daughter has decided that she will call the patient daily to remind her to take her medication as directed.

## 2016-12-24 ENCOUNTER — Other Ambulatory Visit: Payer: Self-pay | Admitting: Hematology and Oncology

## 2016-12-31 ENCOUNTER — Other Ambulatory Visit: Payer: Self-pay | Admitting: Hematology and Oncology

## 2017-01-06 ENCOUNTER — Ambulatory Visit (HOSPITAL_BASED_OUTPATIENT_CLINIC_OR_DEPARTMENT_OTHER): Payer: Medicare Other

## 2017-01-06 ENCOUNTER — Telehealth: Payer: Self-pay | Admitting: Hematology and Oncology

## 2017-01-06 ENCOUNTER — Ambulatory Visit (HOSPITAL_BASED_OUTPATIENT_CLINIC_OR_DEPARTMENT_OTHER): Payer: Medicare Other | Admitting: Hematology and Oncology

## 2017-01-06 ENCOUNTER — Other Ambulatory Visit (HOSPITAL_BASED_OUTPATIENT_CLINIC_OR_DEPARTMENT_OTHER): Payer: Medicare Other

## 2017-01-06 VITALS — BP 157/61 | HR 78 | Temp 98.6°F | Resp 20 | Ht 62.0 in | Wt 151.0 lb

## 2017-01-06 DIAGNOSIS — D708 Other neutropenia: Secondary | ICD-10-CM

## 2017-01-06 DIAGNOSIS — C9110 Chronic lymphocytic leukemia of B-cell type not having achieved remission: Secondary | ICD-10-CM

## 2017-01-06 DIAGNOSIS — D709 Neutropenia, unspecified: Secondary | ICD-10-CM | POA: Diagnosis not present

## 2017-01-06 DIAGNOSIS — K644 Residual hemorrhoidal skin tags: Secondary | ICD-10-CM

## 2017-01-06 DIAGNOSIS — C8300 Small cell B-cell lymphoma, unspecified site: Secondary | ICD-10-CM

## 2017-01-06 DIAGNOSIS — Z95828 Presence of other vascular implants and grafts: Secondary | ICD-10-CM | POA: Insufficient documentation

## 2017-01-06 DIAGNOSIS — K2970 Gastritis, unspecified, without bleeding: Secondary | ICD-10-CM | POA: Insufficient documentation

## 2017-01-06 DIAGNOSIS — K2900 Acute gastritis without bleeding: Secondary | ICD-10-CM

## 2017-01-06 DIAGNOSIS — Z452 Encounter for adjustment and management of vascular access device: Secondary | ICD-10-CM

## 2017-01-06 DIAGNOSIS — Z23 Encounter for immunization: Secondary | ICD-10-CM | POA: Diagnosis not present

## 2017-01-06 LAB — TECHNOLOGIST REVIEW

## 2017-01-06 LAB — COMPREHENSIVE METABOLIC PANEL
ALBUMIN: 4 g/dL (ref 3.5–5.0)
ALK PHOS: 71 U/L (ref 40–150)
ALT: 9 U/L (ref 0–55)
AST: 13 U/L (ref 5–34)
Anion Gap: 11 mEq/L (ref 3–11)
BILIRUBIN TOTAL: 0.6 mg/dL (ref 0.20–1.20)
BUN: 22.8 mg/dL (ref 7.0–26.0)
CALCIUM: 9.9 mg/dL (ref 8.4–10.4)
CO2: 27 mEq/L (ref 22–29)
CREATININE: 1.1 mg/dL (ref 0.6–1.1)
Chloride: 101 mEq/L (ref 98–109)
EGFR: 46 mL/min/{1.73_m2} — ABNORMAL LOW (ref >90)
Glucose: 89 mg/dl (ref 70–140)
Potassium: 4.1 mEq/L (ref 3.5–5.1)
Sodium: 139 mEq/L (ref 136–145)
TOTAL PROTEIN: 6.6 g/dL (ref 6.4–8.3)

## 2017-01-06 LAB — CBC WITH DIFFERENTIAL/PLATELET
BASO%: 0.5 % (ref 0.0–2.0)
Basophils Absolute: 0.1 10*3/uL (ref 0.0–0.1)
EOS%: 0 % (ref 0.0–7.0)
Eosinophils Absolute: 0 10*3/uL (ref 0.0–0.5)
HCT: 36.6 % (ref 34.8–46.6)
HGB: 12 g/dL (ref 11.6–15.9)
LYMPH%: 97.3 % — AB (ref 14.0–49.7)
MCH: 30.8 pg (ref 25.1–34.0)
MCHC: 32.7 g/dL (ref 31.5–36.0)
MCV: 94.1 fL (ref 79.5–101.0)
MONO#: 0.3 10*3/uL (ref 0.1–0.9)
MONO%: 0.9 % (ref 0.0–14.0)
NEUT%: 1.3 % — AB (ref 38.4–76.8)
NEUTROS ABS: 0.4 10*3/uL — AB (ref 1.5–6.5)
Platelets: 279 10*3/uL (ref 145–400)
RBC: 3.89 10*6/uL (ref 3.70–5.45)
RDW: 16.8 % — ABNORMAL HIGH (ref 11.2–14.5)
WBC: 28.1 10*3/uL — AB (ref 3.9–10.3)
lymph#: 27.3 10*3/uL — ABNORMAL HIGH (ref 0.9–3.3)

## 2017-01-06 LAB — URIC ACID: URIC ACID, SERUM: 3.5 mg/dL (ref 2.6–7.4)

## 2017-01-06 MED ORDER — SODIUM CHLORIDE 0.9% FLUSH
10.0000 mL | Freq: Once | INTRAVENOUS | Status: AC
  Administered 2017-01-06: 10 mL
  Filled 2017-01-06: qty 10

## 2017-01-06 MED ORDER — INFLUENZA VAC SPLIT QUAD 0.5 ML IM SUSY
0.5000 mL | PREFILLED_SYRINGE | Freq: Once | INTRAMUSCULAR | Status: AC
  Administered 2017-01-06: 0.5 mL via INTRAMUSCULAR
  Filled 2017-01-06: qty 0.5

## 2017-01-06 MED ORDER — HEPARIN SOD (PORK) LOCK FLUSH 100 UNIT/ML IV SOLN
500.0000 [IU] | Freq: Once | INTRAVENOUS | Status: AC
  Administered 2017-01-06: 500 [IU]
  Filled 2017-01-06: qty 5

## 2017-01-06 MED ORDER — OMEPRAZOLE 20 MG PO CPDR: 20.0000 mg | DELAYED_RELEASE_CAPSULE | Freq: Every day | ORAL | 11 refills | Status: AC

## 2017-01-06 NOTE — Telephone Encounter (Signed)
Gave avs and calendar for November

## 2017-01-07 ENCOUNTER — Encounter: Payer: Self-pay | Admitting: Hematology and Oncology

## 2017-01-07 DIAGNOSIS — K644 Residual hemorrhoidal skin tags: Secondary | ICD-10-CM | POA: Insufficient documentation

## 2017-01-07 NOTE — Progress Notes (Signed)
[place] OFFICE PROGRESS NOTE  Patient Care Team: Koirala, Dibas, MD as PCP - General (Family Medicine)  SUMMARY OF ONCOLOGIC HISTORY:   CLL (chronic lymphocytic leukemia) ([place])   05/02/2013 Initial Diagnosis    She was found to have abnormal CBC from at least since 2015. According to the records from her primary care doctor that were faxed to the [facility], she had leukocytosis at least since January 2015. On 05/02/2013, total white blood cell count was 28.3, normal hemoglobin 13.4 and platelet count 239,000. She had predominantly Lymphocytosis. On 06/20/2013, a CBC show white count of 26.7, hemoglobin 12.9 and platelet count of 299,000 On admission, on 05/08/2016, white blood cell count was 38.4, hemoglobin 12.1 and platelet count of 210. Differential show predominantly lymphocytosis.      05/08/2016 Imaging    CT scan of chest 1. Negative for acute pulmonary embolism 2. Numerous pathologic nodes in the mediastinum, hila, axillae, supraclavicular regions and upper abdomen. The adenopathy is suspicious for malignancy such as lymphoma or CLL. If tissue diagnosis is desired, many of the supraclavicular and axillary nodes should be palpable and accessible to excisional biopsy. 3. Low-attenuation liver lesions, incompletely imaged and not characterized but more likely benign. 4. Small hiatal hernia.      09/22/2016 Pathology Results    Peripheral Blood Flow Cytometry - CHRONIC LYMPHOCYTIC LEUKEMIA. - SEE COMMENT. Diagnosis Comment: There is a population of monoclonal B-cells with expression of CD5 and CD23. The phenotype is consistent with chronic lymphocytic leukemia      09/22/2016 Pathology Results    FISH confirmed trisomy 12      10/21/2016 PET scan    1. Extensive adenopathy in the neck, chest, abdomen, and pelvis as detailed above, primarily Deauville 3 and Deauville 4. Mild splenomegaly. 2. High activity in some soft tissue prominence along the anal region,  possibly physiologic or due to hemorrhoidal activity or urinary incontinence, but correlation with anorectal exam recommended. 3. Focal abnormal accentuated high activity in the sigmoid colon along a region of abnormal stranding and diverticulosis, suspicious for active diverticulitis. Correlation with the patient's colon cancer screening history is recommended. If screening is not up-to-date, appropriate screening should be considered. 4. Aortic Atherosclerosis (ICD10-I70.0). Coronary atherosclerosis. 5. An exophytic high density lesion from the right kidney upper pole does not appear to be hypermetabolic and accordingly is most likely to be a benign complex cyst, but this lesion is not fully characterized on today' s exam. If the patient has hematuria or if otherwise clinically warranted, renal protocol MRI or CT examination could be utilized. 6. Some of the smaller hepatic lesions are nonspecific, but the larger hepatic lesions are photopenic and likely cysts.      10/23/2016 Procedure    Placement of single lumen port a cath via right internal jugular vein. The catheter tip lies at the cavo-atrial junction. A power injectable port a cath was placed and is ready for immediate use      10/29/2016 - 10/30/2016 Hospital Admission    She was admitted after allergic reaction to Rituximab      10/29/2016 Adverse Reaction    She received Rituximab complicated by allergic reaction. Treatment was abandoned      11/20/2016 -  Chemotherapy    The patient is started on chlorambucil and low dose prednisone       INTERVAL HISTORY: Please see below for problem oriented charting. She returns with her daughter for further follow-up She complain of symptoms of gastritis with  epigastric discomfort and nausea Denies vomiting She also have a lump at the anus region but it does not bother her. The patient denies any recent signs or symptoms of bleeding such as spontaneous epistaxis, hematuria or  hematochezia. She denies fever or chills. No new lymphadenopathy  REVIEW OF SYSTEMS:   Constitutional: Denies fevers, chills or abnormal weight loss Eyes: Denies blurriness of vision Ears, nose, mouth, throat, and face: Denies mucositis or sore throat Respiratory: Denies cough, dyspnea or wheezes Cardiovascular: Denies palpitation, chest discomfort or lower extremity swelling Skin: Denies abnormal skin rashes Lymphatics: Denies new lymphadenopathy or easy bruising Neurological:Denies numbness, tingling or new weaknesses Behavioral/Psych: Mood is stable, no new changes  All other systems were reviewed with the patient and are negative.  I have reviewed the past medical history, past surgical history, social history and family history with the patient and they are unchanged from previous note.  ALLERGIES:  is allergic to rituximab.  MEDICATIONS:  Current Outpatient Prescriptions  Medication Sig Dispense Refill  . acetaminophen (TYLENOL) 325 MG tablet Take 650 mg by mouth every 6 (six) hours as needed for mild pain.    Marland Kitchen allopurinol (ZYLOPRIM) 300 MG tablet TAKE 1 TABLET BY MOUTH DAILY 30 tablet 0  . amLODipine (NORVASC) 10 MG tablet TAKE 1 TABLET BY MOUTH DAILY 30 tablet 0  . aspirin EC 81 MG tablet Take 81 mg by mouth daily.    . chlorambucil (LEUKERAN) 2 MG tablet Take 1 tablet (2 mg total) by mouth daily. Give on an empty stomach 1 hour before or 2 hours after meals. 30 tablet 1  . COMBIVENT RESPIMAT 20-100 MCG/ACT AERS respimat Take 2 puffs by mouth every 6 (six) hours as needed for wheezing or shortness of breath.     . isosorbide mononitrate (IMDUR) 30 MG 24 hr tablet Take 30 mg by mouth daily.     Marland Kitchen lidocaine-prilocaine (EMLA) cream Apply 1 application topically as needed. (Patient taking differently: Apply 1 application topically as needed (For port-a-cath.). ) 30 g 6  . lovastatin (MEVACOR) 40 MG tablet Take 40 mg by mouth at bedtime.     . montelukast (SINGULAIR) 10 MG tablet  Take 10 mg by mouth at bedtime.     Marland Kitchen omeprazole (PRILOSEC) 20 MG capsule Take 1 capsule (20 mg total) by mouth daily. 30 capsule 11  . ondansetron (ZOFRAN) 8 MG tablet Take 1 tablet (8 mg total) by mouth every 8 (eight) hours as needed for nausea. 30 tablet 3  . potassium chloride (K-DUR) 10 MEQ tablet Take 10 mEq by mouth daily.    . predniSONE (DELTASONE) 5 MG tablet Take 1 tablet (5 mg total) by mouth daily with breakfast. 60 tablet 1  . promethazine (PHENERGAN) 12.5 MG tablet Take 1 tablet (12.5 mg total) by mouth every 6 (six) hours as needed for nausea. 30 tablet 3  . QUEtiapine (SEROQUEL) 25 MG tablet Take 12.5 mg by mouth at bedtime.      No current facility-administered medications for this visit.     PHYSICAL EXAMINATION: ECOG PERFORMANCE STATUS: 1 - Symptomatic but completely ambulatory  Vitals:   01/06/17 0853  BP: (!) 157/61  Pulse: 78  Resp: 20  Temp: 98.6 F (37 C)  SpO2: 97%   Filed Weights   01/06/17 0853  Weight: 151 lb (68.5 kg)    GENERAL:alert, no distress and comfortable SKIN: skin color, texture, turgor are normal, no rashes or significant lesions EYES: normal, Conjunctiva are pink and non-injected, sclera clear  OROPHARYNX:no exudate, no erythema and lips, buccal mucosa, and tongue normal  NECK: supple, thyroid normal size, non-tender, without nodularity LYMPH:  no palpable lymphadenopathy in the cervical, axillary or inguinal LUNGS: clear to auscultation and percussion with normal breathing effort HEART: regular rate & rhythm and no murmurs and no lower extremity edema ABDOMEN:abdomen soft, mild epigastric discomfort compatible with gastritis.  Noted external hemorrhoid on rectal exam Musculoskeletal:no cyanosis of digits and no clubbing  NEURO: alert & oriented x 3 with fluent speech, no focal motor/sensory deficits  LABORATORY DATA:  I have reviewed the data as listed    Component Value Date/Time   NA 139 01/06/2017 0819   K 4.1 01/06/2017 0819    CL 102 10/30/2016 0409   CO2 27 01/06/2017 0819   GLUCOSE 89 01/06/2017 0819   BUN 22.8 01/06/2017 0819   CREATININE 1.1 01/06/2017 0819   CALCIUM 9.9 01/06/2017 0819   PROT 6.6 01/06/2017 0819   ALBUMIN 4.0 01/06/2017 0819   AST 13 01/06/2017 0819   ALT 9 01/06/2017 0819   ALKPHOS 71 01/06/2017 0819   BILITOT 0.60 01/06/2017 0819   GFRNONAA 49 (L) 10/30/2016 0409   GFRAA 57 (L) 10/30/2016 0409    No results found for: SPEP, UPEP  Lab Results  Component Value Date   WBC 28.1 (H) 01/06/2017   NEUTROABS 0.4 (LL) 01/06/2017   HGB 12.0 01/06/2017   HCT 36.6 01/06/2017   MCV 94.1 01/06/2017   PLT 279 01/06/2017      Chemistry      Component Value Date/Time   NA 139 01/06/2017 0819   K 4.1 01/06/2017 0819   CL 102 10/30/2016 0409   CO2 27 01/06/2017 0819   BUN 22.8 01/06/2017 0819   CREATININE 1.1 01/06/2017 0819      Component Value Date/Time   CALCIUM 9.9 01/06/2017 0819   ALKPHOS 71 01/06/2017 0819   AST 13 01/06/2017 0819   ALT 9 01/06/2017 0819   BILITOT 0.60 01/06/2017 0819      ASSESSMENT & PLAN:  CLL (chronic lymphocytic leukemia) ([place]) She tolerated treatment with expected side effects Lymphocytosis is improving I recommend she takes 2 mg of chlorambucil daily for 14 days, rest 7 days On the days that she does not take chlorambucil, she will take low-dose prednisone I will see her back again next month for further toxicity review  Gastritis She has symptoms of gastritis I recommend proton pump inhibitor, conservative management with calcium carbonate and also nausea medicine as indicated  External hemorrhoids without complication She has findings of external hemorrhoid without active bleeding Recommend conservative management with observation only  Neutropenia ([place]) She has neutropenia likely due to CLL but remain asymptomatic We discussed neutropenic precaution and I educated the patient and her daughter signs and symptoms to watch out for  infection   No orders of the defined types were placed in this encounter.  All questions were answered. The patient knows to call the clinic with any problems, questions or concerns. No barriers to learning was detected. I spent 20 minutes counseling the patient face to face. The total time spent in the appointment was 25 minutes and more than 50% was on counseling and review of test results     Heath Lark, MD 01/07/2017 9:06 AM

## 2017-01-07 NOTE — Assessment & Plan Note (Signed)
She has neutropenia likely due to CLL but remain asymptomatic We discussed neutropenic precaution and I educated the patient and her daughter signs and symptoms to watch out for infection

## 2017-01-07 NOTE — Assessment & Plan Note (Signed)
She has findings of external hemorrhoid without active bleeding Recommend conservative management with observation only

## 2017-01-07 NOTE — Assessment & Plan Note (Signed)
She tolerated treatment with expected side effects Lymphocytosis is improving I recommend she takes 2 mg of chlorambucil daily for 14 days, rest 7 days On the days that she does not take chlorambucil, she will take low-dose prednisone I will see her back again next month for further toxicity review

## 2017-01-07 NOTE — Assessment & Plan Note (Signed)
She has symptoms of gastritis I recommend proton pump inhibitor, conservative management with calcium carbonate and also nausea medicine as indicated

## 2017-01-08 ENCOUNTER — Encounter: Payer: Self-pay | Admitting: Hematology and Oncology

## 2017-01-08 NOTE — Progress Notes (Signed)
Pt's daughter, [name], called on 01/06/17 regarding financial assistance.  After researching I found [facility] had funding available for pt's Dx and was given consent to apply in her behalf.  Pt was approved for $3,500 from 01/05/17 to 01/04/18 with a 90 day look back period.  Emailed copies of approval letter and POE to Ledora Bottcher and [place] in billing, to HIM to scan in pt's chart and to [place] to file in her book.

## 2017-01-09 ENCOUNTER — Other Ambulatory Visit: Payer: Self-pay | Admitting: Hematology and Oncology

## 2017-01-09 DIAGNOSIS — C9110 Chronic lymphocytic leukemia of B-cell type not having achieved remission: Secondary | ICD-10-CM

## 2017-01-26 ENCOUNTER — Other Ambulatory Visit: Payer: Self-pay | Admitting: Hematology and Oncology

## 2017-01-26 DIAGNOSIS — C9110 Chronic lymphocytic leukemia of B-cell type not having achieved remission: Secondary | ICD-10-CM

## 2017-01-27 MED FILL — LEUKERAN 2 MG TABLET: 2 | 25 days supply | Qty: 25 | Fill #0

## 2017-02-13 ENCOUNTER — Telehealth: Payer: Self-pay

## 2017-02-13 NOTE — Telephone Encounter (Signed)
Called [name] with below message. Verbalized understanding. [name] is encouraging fluids.

## 2017-02-13 NOTE — Telephone Encounter (Signed)
[name] called. The pt did start the omeprazole. Her stomach was starting to feel better. Now she is having "pain where she poops"  and no BM in 2 days. Her normal BM pattern is every other day. Milk of magnesium took 1 dose last night. No BM today. Pt was asking for a numbing cream for her bottom. Dr Alvy Bimler did say she  has hemorrhoids.  Yesterday pt called daughter that she was in a lot of pain and would sit on toilet and nothing happened.  She has not felt well since yesterday.  Did not eat yesterday b/c she felt bad. And she did not get out of bed earlier today for not feeling well.   Pt lives at [facility]

## 2017-02-13 NOTE — Telephone Encounter (Signed)
1) OK to use glycerin suppository 2) Sitz bath for hemorrhoids 3) Daily Miralax 4) Recommend oral fluids if she can; I would prefer to give her IVF but if she cannot make it she needs to do what she can at home 5) Hold chemo until her evaluation next week

## 2017-02-17 ENCOUNTER — Other Ambulatory Visit (HOSPITAL_BASED_OUTPATIENT_CLINIC_OR_DEPARTMENT_OTHER): Payer: Medicare Other

## 2017-02-17 ENCOUNTER — Telehealth: Payer: Self-pay | Admitting: Hematology and Oncology

## 2017-02-17 ENCOUNTER — Ambulatory Visit (HOSPITAL_BASED_OUTPATIENT_CLINIC_OR_DEPARTMENT_OTHER): Payer: Medicare Other | Admitting: Hematology and Oncology

## 2017-02-17 ENCOUNTER — Ambulatory Visit: Payer: Medicare Other

## 2017-02-17 ENCOUNTER — Encounter: Payer: Self-pay | Admitting: Hematology and Oncology

## 2017-02-17 DIAGNOSIS — C9110 Chronic lymphocytic leukemia of B-cell type not having achieved remission: Secondary | ICD-10-CM

## 2017-02-17 DIAGNOSIS — Z95828 Presence of other vascular implants and grafts: Secondary | ICD-10-CM

## 2017-02-17 DIAGNOSIS — C8300 Small cell B-cell lymphoma, unspecified site: Secondary | ICD-10-CM

## 2017-02-17 DIAGNOSIS — K649 Unspecified hemorrhoids: Secondary | ICD-10-CM

## 2017-02-17 DIAGNOSIS — K644 Residual hemorrhoidal skin tags: Secondary | ICD-10-CM

## 2017-02-17 DIAGNOSIS — I10 Essential (primary) hypertension: Secondary | ICD-10-CM | POA: Diagnosis not present

## 2017-02-17 LAB — CBC WITH DIFFERENTIAL/PLATELET
BASO%: 0.5 % (ref 0.0–2.0)
BASOS ABS: 0 10*3/uL (ref 0.0–0.1)
EOS ABS: 0.1 10*3/uL (ref 0.0–0.5)
EOS%: 1.3 % (ref 0.0–7.0)
HEMATOCRIT: 37.9 % (ref 34.8–46.6)
HEMOGLOBIN: 12.4 g/dL (ref 11.6–15.9)
LYMPH#: 8.5 10*3/uL — AB (ref 0.9–3.3)
LYMPH%: 94.1 % — ABNORMAL HIGH (ref 14.0–49.7)
MCH: 30.2 pg (ref 25.1–34.0)
MCHC: 32.7 g/dL (ref 31.5–36.0)
MCV: 92.4 fL (ref 79.5–101.0)
MONO#: 0 10*3/uL — AB (ref 0.1–0.9)
MONO%: 0.3 % (ref 0.0–14.0)
NEUT#: 0.3 10*3/uL — CL (ref 1.5–6.5)
NEUT%: 3.8 % — ABNORMAL LOW (ref 38.4–76.8)
Platelets: 189 10*3/uL (ref 145–400)
RBC: 4.1 10*6/uL (ref 3.70–5.45)
RDW: 15.7 % — AB (ref 11.2–14.5)
WBC: 9 10*3/uL (ref 3.9–10.3)

## 2017-02-17 LAB — COMPREHENSIVE METABOLIC PANEL
ALT: 13 U/L (ref 0–55)
ANION GAP: 8 meq/L (ref 3–11)
AST: 13 U/L (ref 5–34)
Albumin: 3.9 g/dL (ref 3.5–5.0)
Alkaline Phosphatase: 74 U/L (ref 40–150)
BUN: 16.4 mg/dL (ref 7.0–26.0)
CHLORIDE: 103 meq/L (ref 98–109)
CO2: 27 meq/L (ref 22–29)
Calcium: 9.3 mg/dL (ref 8.4–10.4)
Creatinine: 1.1 mg/dL (ref 0.6–1.1)
EGFR: 46 mL/min/{1.73_m2} — AB (ref >60)
Glucose: 92 mg/dl (ref 70–140)
Potassium: 3.7 mEq/L (ref 3.5–5.1)
Sodium: 138 mEq/L (ref 136–145)
Total Bilirubin: 0.6 mg/dL (ref 0.20–1.20)
Total Protein: 6.4 g/dL (ref 6.4–8.3)

## 2017-02-17 LAB — TECHNOLOGIST REVIEW

## 2017-02-17 MED ORDER — SODIUM CHLORIDE 0.9% FLUSH
10.0000 mL | Freq: Once | INTRAVENOUS | Status: DC
  Filled 2017-02-17: qty 10

## 2017-02-17 MED ORDER — HEPARIN SOD (PORK) LOCK FLUSH 100 UNIT/ML IV SOLN
500.0000 [IU] | Freq: Once | INTRAVENOUS | Status: DC
  Filled 2017-02-17: qty 5

## 2017-02-17 NOTE — Telephone Encounter (Signed)
Gave avs and calendar for november and January 2019

## 2017-02-17 NOTE — Assessment & Plan Note (Signed)
She has chronic constipation We discussed extensively laxative therapy

## 2017-02-17 NOTE — Progress Notes (Signed)
[facility] OFFICE PROGRESS NOTE  Patient Care Team: Koirala, Dibas, MD as PCP - General (Family Medicine)  SUMMARY OF ONCOLOGIC HISTORY:   CLL (chronic lymphocytic leukemia) ([place])   05/02/2013 Initial Diagnosis    She was found to have abnormal CBC from at least since 2015. According to the records from her primary care doctor that were faxed to the [facility], she had leukocytosis at least since January 2015. On 05/02/2013, total white blood cell count was 28.3, normal hemoglobin 13.4 and platelet count 239,000. She had predominantly Lymphocytosis. On 06/20/2013, a CBC show white count of 26.7, hemoglobin 12.9 and platelet count of 299,000 On admission, on 05/08/2016, white blood cell count was 38.4, hemoglobin 12.1 and platelet count of 210. Differential show predominantly lymphocytosis.      05/08/2016 Imaging    CT scan of chest 1. Negative for acute pulmonary embolism 2. Numerous pathologic nodes in the mediastinum, hila, axillae, supraclavicular regions and upper abdomen. The adenopathy is suspicious for malignancy such as lymphoma or CLL. If tissue diagnosis is desired, many of the supraclavicular and axillary nodes should be palpable and accessible to excisional biopsy. 3. Low-attenuation liver lesions, incompletely imaged and not characterized but more likely benign. 4. Small hiatal hernia.      09/22/2016 Pathology Results    Peripheral Blood Flow Cytometry - CHRONIC LYMPHOCYTIC LEUKEMIA. - SEE COMMENT. Diagnosis Comment: There is a population of monoclonal B-cells with expression of CD5 and CD23. The phenotype is consistent with chronic lymphocytic leukemia      09/22/2016 Pathology Results    FISH confirmed trisomy 12      10/21/2016 PET scan    1. Extensive adenopathy in the neck, chest, abdomen, and pelvis as detailed above, primarily Deauville 3 and Deauville 4. Mild splenomegaly. 2. High activity in some soft tissue prominence along the anal region,  possibly physiologic or due to hemorrhoidal activity or urinary incontinence, but correlation with anorectal exam recommended. 3. Focal abnormal accentuated high activity in the sigmoid colon along a region of abnormal stranding and diverticulosis, suspicious for active diverticulitis. Correlation with the patient's colon cancer screening history is recommended. If screening is not up-to-date, appropriate screening should be considered. 4. Aortic Atherosclerosis (ICD10-I70.0). Coronary atherosclerosis. 5. An exophytic high density lesion from the right kidney upper pole does not appear to be hypermetabolic and accordingly is most likely to be a benign complex cyst, but this lesion is not fully characterized on today' s exam. If the patient has hematuria or if otherwise clinically warranted, renal protocol MRI or CT examination could be utilized. 6. Some of the smaller hepatic lesions are nonspecific, but the larger hepatic lesions are photopenic and likely cysts.      10/23/2016 Procedure    Placement of single lumen port a cath via right internal jugular vein. The catheter tip lies at the cavo-atrial junction. A power injectable port a cath was placed and is ready for immediate use      10/29/2016 - 10/30/2016 Hospital Admission    She was admitted after allergic reaction to Rituximab      10/29/2016 Adverse Reaction    She received Rituximab complicated by allergic reaction. Treatment was abandoned      11/20/2016 -  Chemotherapy    The patient is started on chlorambucil and low dose prednisone       INTERVAL HISTORY: Please see below for problem oriented charting. She returns with her daughter today The patient appears anxious and upset Last week, she  developed severe constipation, subsequently resolved with aggressive laxative therapy She has poor oral intake of fruit and vegetables According to her daughter, the patient has been taking Zofran on a regular basis for abdominal pain  that comes and goes She denies nausea No new lymphadenopathy Denies recent infection  REVIEW OF SYSTEMS:   Constitutional: Denies fevers, chills or abnormal weight loss Eyes: Denies blurriness of vision Ears, nose, mouth, throat, and face: Denies mucositis or sore throat Respiratory: Denies cough, dyspnea or wheezes Cardiovascular: Denies palpitation, chest discomfort or lower extremity swelling Skin: Denies abnormal skin rashes Lymphatics: Denies new lymphadenopathy or easy bruising Neurological:Denies numbness, tingling or new weaknesses Behavioral/Psych: Mood is stable, no new changes  All other systems were reviewed with the patient and are negative.  I have reviewed the past medical history, past surgical history, social history and family history with the patient and they are unchanged from previous note.  ALLERGIES:  is allergic to rituximab.  MEDICATIONS:  Current Outpatient Medications  Medication Sig Dispense Refill  . acetaminophen (TYLENOL) 325 MG tablet Take 650 mg by mouth every 6 (six) hours as needed for mild pain.    Marland Kitchen allopurinol (ZYLOPRIM) 300 MG tablet TAKE 1 TABLET BY MOUTH DAILY 30 tablet 0  . amLODipine (NORVASC) 10 MG tablet TAKE 1 TABLET BY MOUTH DAILY 30 tablet 0  . aspirin EC 81 MG tablet Take 81 mg by mouth daily.    . COMBIVENT RESPIMAT 20-100 MCG/ACT AERS respimat Take 2 puffs by mouth every 6 (six) hours as needed for wheezing or shortness of breath.     . isosorbide mononitrate (IMDUR) 30 MG 24 hr tablet Take 30 mg by mouth daily.     Marland Kitchen LEUKERAN 2 MG tablet TAKE 1 TABLET (2 MG TOTAL) BY MOUTH DAILY. GIVE ON AN EMPTY STOMACH 1 HOUR BEFORE OR 2 HOURS AFTER MEALS. 25 tablet 1  . lidocaine-prilocaine (EMLA) cream Apply 1 application topically as needed. (Patient taking differently: Apply 1 application topically as needed (For port-a-cath.). ) 30 g 6  . lovastatin (MEVACOR) 40 MG tablet Take 40 mg by mouth at bedtime.     . montelukast (SINGULAIR) 10 MG  tablet Take 10 mg by mouth at bedtime.     Marland Kitchen omeprazole (PRILOSEC) 20 MG capsule Take 1 capsule (20 mg total) by mouth daily. 30 capsule 11  . ondansetron (ZOFRAN) 8 MG tablet Take 1 tablet (8 mg total) by mouth every 8 (eight) hours as needed for nausea. 30 tablet 3  . potassium chloride (K-DUR) 10 MEQ tablet Take 10 mEq by mouth daily.    . predniSONE (DELTASONE) 5 MG tablet Take 1 tablet (5 mg total) by mouth daily with breakfast. 60 tablet 1  . promethazine (PHENERGAN) 12.5 MG tablet Take 1 tablet (12.5 mg total) by mouth every 6 (six) hours as needed for nausea. 30 tablet 3  . QUEtiapine (SEROQUEL) 25 MG tablet Take 12.5 mg by mouth at bedtime.      No current facility-administered medications for this visit.     PHYSICAL EXAMINATION: ECOG PERFORMANCE STATUS: 1 - Symptomatic but completely ambulatory  Vitals:   02/17/17 0823  BP: (!) 163/76  Pulse: 97  Resp: 17  Temp: 98.1 F (36.7 C)  SpO2: 97%   Filed Weights   02/17/17 0823  Weight: 154 lb 8 oz (70.1 kg)    GENERAL:alert, no distress and comfortable SKIN: skin color, texture, turgor are normal, no rashes or significant lesions EYES: normal, Conjunctiva are pink and  non-injected, sclera clear OROPHARYNX:no exudate, no erythema and lips, buccal mucosa, and tongue normal  NECK: supple, thyroid normal size, non-tender, without nodularity LYMPH: She has persistent palpable lymphadenopathy in her neck and supraclavicular region, reduced in size LUNGS: clear to auscultation and percussion with normal breathing effort HEART: regular rate & rhythm and no murmurs and no lower extremity edema ABDOMEN:abdomen soft, non-tender and normal bowel sounds Musculoskeletal:no cyanosis of digits and no clubbing  NEURO: alert & oriented x 3 with fluent speech, no focal motor/sensory deficits  LABORATORY DATA:  I have reviewed the data as listed    Component Value Date/Time   NA 138 02/17/2017 0810   K 3.7 02/17/2017 0810   CL 102  10/30/2016 0409   CO2 27 02/17/2017 0810   GLUCOSE 92 02/17/2017 0810   BUN 16.4 02/17/2017 0810   CREATININE 1.1 02/17/2017 0810   CALCIUM 9.3 02/17/2017 0810   PROT 6.4 02/17/2017 0810   ALBUMIN 3.9 02/17/2017 0810   AST 13 02/17/2017 0810   ALT 13 02/17/2017 0810   ALKPHOS 74 02/17/2017 0810   BILITOT 0.60 02/17/2017 0810   GFRNONAA 49 (L) 10/30/2016 0409   GFRAA 57 (L) 10/30/2016 0409    No results found for: SPEP, UPEP  Lab Results  Component Value Date   WBC 9.0 02/17/2017   NEUTROABS 0.3 (LL) 02/17/2017   HGB 12.4 02/17/2017   HCT 37.9 02/17/2017   MCV 92.4 02/17/2017   PLT 189 02/17/2017      Chemistry      Component Value Date/Time   NA 138 02/17/2017 0810   K 3.7 02/17/2017 0810   CL 102 10/30/2016 0409   CO2 27 02/17/2017 0810   BUN 16.4 02/17/2017 0810   CREATININE 1.1 02/17/2017 0810      Component Value Date/Time   CALCIUM 9.3 02/17/2017 0810   ALKPHOS 74 02/17/2017 0810   AST 13 02/17/2017 0810   ALT 13 02/17/2017 0810   BILITOT 0.60 02/17/2017 0810      ASSESSMENT & PLAN:  CLL (chronic lymphocytic leukemia) ([place]) Her total white blood cell count has normalized even though she  has persistent neutropenia and lymphocytosis I recommend she will continue to take prednisone and chlorambucil for 1 week, then off 2 weeks I plan to see her back in a few months for further follow-up  External hemorrhoids without complication She has chronic constipation We discussed extensively laxative therapy  HTN (hypertension) She has fluctuation of her blood pressure The patient appears upset and anxious today I recommend close observation for now   No orders of the defined types were placed in this encounter.  All questions were answered. The patient knows to call the clinic with any problems, questions or concerns. No barriers to learning was detected. I spent 15 minutes counseling the patient face to face. The total time spent in the appointment was 20  minutes and more than 50% was on counseling and review of test results     Heath Lark, MD 02/17/2017 2:33 PM

## 2017-02-17 NOTE — Assessment & Plan Note (Signed)
She has fluctuation of her blood pressure The patient appears upset and anxious today I recommend close observation for now

## 2017-02-17 NOTE — Assessment & Plan Note (Signed)
Her total white blood cell count has normalized even though she  has persistent neutropenia and lymphocytosis I recommend she will continue to take prednisone and chlorambucil for 1 week, then off 2 weeks I plan to see her back in a few months for further follow-up

## 2017-02-17 NOTE — Progress Notes (Signed)
Port was not accessed due to patient not having lidocaine cream on. Per patient. Labs are being drawn peripherally. Porsche Cates LPN

## 2017-02-20 ENCOUNTER — Ambulatory Visit (HOSPITAL_BASED_OUTPATIENT_CLINIC_OR_DEPARTMENT_OTHER): Payer: Medicare Other

## 2017-02-20 DIAGNOSIS — Z452 Encounter for adjustment and management of vascular access device: Secondary | ICD-10-CM | POA: Diagnosis not present

## 2017-02-20 DIAGNOSIS — C9110 Chronic lymphocytic leukemia of B-cell type not having achieved remission: Secondary | ICD-10-CM

## 2017-02-20 DIAGNOSIS — Z95828 Presence of other vascular implants and grafts: Secondary | ICD-10-CM

## 2017-02-20 MED ORDER — HEPARIN SOD (PORK) LOCK FLUSH 100 UNIT/ML IV SOLN
500.0000 [IU] | Freq: Once | INTRAVENOUS | Status: AC
  Administered 2017-02-20: 500 [IU]
  Filled 2017-02-20: qty 5

## 2017-02-20 MED ORDER — SODIUM CHLORIDE 0.9% FLUSH
10.0000 mL | Freq: Once | INTRAVENOUS | Status: AC
  Administered 2017-02-20: 10 mL
  Filled 2017-02-20: qty 10

## 2017-03-09 ENCOUNTER — Other Ambulatory Visit: Payer: Self-pay | Admitting: Hematology and Oncology

## 2017-03-09 NOTE — Telephone Encounter (Signed)
I received electronic refill request Please call her daughter She does not need allopurinol There is FDA recall on amlodipine. She needs appt to see PCP for BP management. If not possible, let me know

## 2017-03-09 NOTE — Telephone Encounter (Signed)
Called and left below message. Ask daughter to call the nurse back.

## 2017-03-10 ENCOUNTER — Telehealth: Payer: Self-pay

## 2017-03-10 NOTE — Telephone Encounter (Signed)
Called and left message regarding Rx refill request from pharmacy. Left message yesterday that patient does not need Allopurinol. There is a FDA recall on Amlodipine. Patient needs appt to see PCP for blood pressure management. If this is not possible, call us back.

## 2017-03-13 ENCOUNTER — Other Ambulatory Visit: Payer: Self-pay

## 2017-03-13 ENCOUNTER — Telehealth: Payer: Self-pay

## 2017-03-13 ENCOUNTER — Other Ambulatory Visit: Payer: Self-pay | Admitting: Hematology and Oncology

## 2017-03-13 MED ORDER — METOPROLOL TARTRATE 25 MG PO TABS: 12.5000 mg | ORAL_TABLET | Freq: Two times a day (BID) | ORAL | 0 refills | Status: DC

## 2017-03-13 NOTE — Telephone Encounter (Signed)
Daughter called and left message to call her. Called daughter back. She had surgery Tuesday and has not been able to call back. She got the earlier message regarding FDA recall on Amlodipine. She only has a few pills left.  The earliest they could get in with PCP is February. She used to take Erberstartan 300 mg daily and still has some at home.

## 2017-03-13 NOTE — Telephone Encounter (Signed)
I think Erbesartan was also on the recall list I recommend metoprolol 12.5 mg BID PO You can call in 60 days, no refills

## 2017-03-13 NOTE — Telephone Encounter (Signed)
Called with below message. Rx sent to pharmacy.

## 2017-03-31 ENCOUNTER — Observation Stay (HOSPITAL_COMMUNITY): Payer: Medicare Other

## 2017-03-31 ENCOUNTER — Other Ambulatory Visit: Payer: Self-pay

## 2017-03-31 ENCOUNTER — Emergency Department (HOSPITAL_COMMUNITY): Payer: Medicare Other

## 2017-03-31 ENCOUNTER — Inpatient Hospital Stay (HOSPITAL_COMMUNITY)
Admission: EM | Admit: 2017-03-31 | Discharge: 2017-04-03 | DRG: 683 | Disposition: A | Discharge status: Skilled Nursing Facility | Payer: Medicare Other | Attending: Internal Medicine | Admitting: Internal Medicine

## 2017-03-31 ENCOUNTER — Encounter (HOSPITAL_COMMUNITY): Payer: Self-pay

## 2017-03-31 DIAGNOSIS — F0390 Unspecified dementia without behavioral disturbance: Secondary | ICD-10-CM | POA: Diagnosis present

## 2017-03-31 DIAGNOSIS — E860 Dehydration: Secondary | ICD-10-CM | POA: Diagnosis present

## 2017-03-31 DIAGNOSIS — M79661 Pain in right lower leg: Secondary | ICD-10-CM | POA: Diagnosis present

## 2017-03-31 DIAGNOSIS — C9190 Lymphoid leukemia, unspecified not having achieved remission: Secondary | ICD-10-CM

## 2017-03-31 DIAGNOSIS — Y92009 Unspecified place in unspecified non-institutional (private) residence as the place of occurrence of the external cause: Secondary | ICD-10-CM

## 2017-03-31 DIAGNOSIS — Z79899 Other long term (current) drug therapy: Secondary | ICD-10-CM

## 2017-03-31 DIAGNOSIS — R531 Weakness: Secondary | ICD-10-CM | POA: Diagnosis not present

## 2017-03-31 DIAGNOSIS — M25552 Pain in left hip: Secondary | ICD-10-CM | POA: Diagnosis present

## 2017-03-31 DIAGNOSIS — I10 Essential (primary) hypertension: Secondary | ICD-10-CM | POA: Diagnosis present

## 2017-03-31 DIAGNOSIS — N179 Acute kidney failure, unspecified: Secondary | ICD-10-CM | POA: Diagnosis not present

## 2017-03-31 DIAGNOSIS — C9110 Chronic lymphocytic leukemia of B-cell type not having achieved remission: Secondary | ICD-10-CM | POA: Diagnosis present

## 2017-03-31 DIAGNOSIS — W19XXXA Unspecified fall, initial encounter: Secondary | ICD-10-CM | POA: Diagnosis present

## 2017-03-31 DIAGNOSIS — M25512 Pain in left shoulder: Secondary | ICD-10-CM | POA: Diagnosis present

## 2017-03-31 DIAGNOSIS — R748 Abnormal levels of other serum enzymes: Secondary | ICD-10-CM

## 2017-03-31 DIAGNOSIS — E876 Hypokalemia: Secondary | ICD-10-CM | POA: Diagnosis present

## 2017-03-31 DIAGNOSIS — Z87891 Personal history of nicotine dependence: Secondary | ICD-10-CM

## 2017-03-31 DIAGNOSIS — Z955 Presence of coronary angioplasty implant and graft: Secondary | ICD-10-CM

## 2017-03-31 DIAGNOSIS — Z888 Allergy status to other drugs, medicaments and biological substances status: Secondary | ICD-10-CM

## 2017-03-31 DIAGNOSIS — I2510 Atherosclerotic heart disease of native coronary artery without angina pectoris: Secondary | ICD-10-CM | POA: Diagnosis present

## 2017-03-31 DIAGNOSIS — Z7952 Long term (current) use of systemic steroids: Secondary | ICD-10-CM

## 2017-03-31 DIAGNOSIS — R5381 Other malaise: Secondary | ICD-10-CM | POA: Diagnosis present

## 2017-03-31 DIAGNOSIS — E785 Hyperlipidemia, unspecified: Secondary | ICD-10-CM | POA: Diagnosis present

## 2017-03-31 LAB — BASIC METABOLIC PANEL
Anion gap: 13 (ref 5–15)
BUN: 44 mg/dL — AB (ref 6–20)
CHLORIDE: 103 mmol/L (ref 101–111)
CO2: 26 mmol/L (ref 22–32)
Calcium: 9.5 mg/dL (ref 8.9–10.3)
Creatinine, Ser: 1.34 mg/dL — ABNORMAL HIGH (ref 0.44–1.00)
GFR calc non Af Amer: 34 mL/min — ABNORMAL LOW (ref >60)
GFR, EST AFRICAN AMERICAN: 40 mL/min — AB (ref >60)
Glucose, Bld: 110 mg/dL — ABNORMAL HIGH (ref 65–99)
POTASSIUM: 3.8 mmol/L (ref 3.5–5.1)
SODIUM: 142 mmol/L (ref 135–145)

## 2017-03-31 LAB — URINALYSIS, ROUTINE W REFLEX MICROSCOPIC
BILIRUBIN URINE: NEGATIVE
Glucose, UA: NEGATIVE mg/dL
KETONES UR: 5 mg/dL — AB
LEUKOCYTES UA: NEGATIVE
Nitrite: NEGATIVE
PROTEIN: 100 mg/dL — AB
Specific Gravity, Urine: 1.016 (ref 1.005–1.030)
pH: 5 (ref 5.0–8.0)

## 2017-03-31 LAB — CK: Total CK: 1025 U/L — ABNORMAL HIGH (ref 38–234)

## 2017-03-31 LAB — CBC
HEMATOCRIT: 42.6 % (ref 36.0–46.0)
Hemoglobin: 14 g/dL (ref 12.0–15.0)
MCH: 29.4 pg (ref 26.0–34.0)
MCHC: 32.9 g/dL (ref 30.0–36.0)
MCV: 89.3 fL (ref 78.0–100.0)
Platelets: 313 10*3/uL (ref 150–400)
RBC: 4.77 MIL/uL (ref 3.87–5.11)
RDW: 15.2 % (ref 11.5–15.5)
WBC: 16.3 10*3/uL — AB (ref 4.0–10.5)

## 2017-03-31 LAB — CBG MONITORING, ED: GLUCOSE-CAPILLARY: 108 mg/dL — AB (ref 65–99)

## 2017-03-31 MED ORDER — ACETAMINOPHEN 500 MG PO TABS
1000.0000 mg | ORAL_TABLET | Freq: Once | ORAL | Status: AC
  Administered 2017-03-31: 1000 mg via ORAL
  Filled 2017-03-31: qty 2

## 2017-03-31 MED ORDER — CHLORAMBUCIL 2 MG PO TABS: 2.0000 mg | ORAL_TABLET | Freq: Every day | ORAL | Status: DC

## 2017-03-31 MED ORDER — SODIUM CHLORIDE 0.9 % IV BOLUS (SEPSIS)
500.0000 mL | Freq: Once | INTRAVENOUS | Status: AC
  Administered 2017-03-31: 500 mL via INTRAVENOUS

## 2017-03-31 MED ORDER — LIDOCAINE-PRILOCAINE 2.5-2.5 % EX CREA: 1.0000 "application " | TOPICAL_CREAM | CUTANEOUS | Status: DC | PRN

## 2017-03-31 MED ORDER — ACETAMINOPHEN 650 MG RE SUPP: 650.0000 mg | Freq: Four times a day (QID) | RECTAL | Status: DC | PRN

## 2017-03-31 MED ORDER — MONTELUKAST SODIUM 10 MG PO TABS
10.0000 mg | ORAL_TABLET | Freq: Every day | ORAL | Status: DC
Start: 2017-04-01 — End: 2017-04-03
  Administered 2017-04-01 – 2017-04-02 (×3): 10 mg via ORAL
  Filled 2017-03-31 (×3): qty 1

## 2017-03-31 MED ORDER — ONDANSETRON HCL 4 MG PO TABS: 8.0000 mg | ORAL_TABLET | Freq: Three times a day (TID) | ORAL | Status: DC | PRN

## 2017-03-31 MED ORDER — IPRATROPIUM-ALBUTEROL 0.5-2.5 (3) MG/3ML IN SOLN: 3.0000 mL | Freq: Four times a day (QID) | RESPIRATORY_TRACT | Status: DC | PRN

## 2017-03-31 MED ORDER — PREDNISONE 5 MG PO TABS
5.0000 mg | ORAL_TABLET | Freq: Every day | ORAL | Status: DC
Start: 2017-04-01 — End: 2017-04-03
  Administered 2017-04-01 – 2017-04-03 (×3): 5 mg via ORAL
  Filled 2017-03-31 (×3): qty 1

## 2017-03-31 MED ORDER — PROMETHAZINE HCL 25 MG PO TABS: 12.5000 mg | ORAL_TABLET | Freq: Four times a day (QID) | ORAL | Status: DC | PRN

## 2017-03-31 MED ORDER — ONDANSETRON HCL 4 MG/2ML IJ SOLN: 4.0000 mg | Freq: Four times a day (QID) | INTRAMUSCULAR | Status: DC | PRN

## 2017-03-31 MED ORDER — PANTOPRAZOLE SODIUM 40 MG PO TBEC
40.0000 mg | DELAYED_RELEASE_TABLET | Freq: Every day | ORAL | Status: DC
  Administered 2017-04-01 – 2017-04-03 (×3): 40 mg via ORAL
  Filled 2017-03-31 (×3): qty 1

## 2017-03-31 MED ORDER — METOPROLOL TARTRATE 25 MG PO TABS
12.5000 mg | ORAL_TABLET | Freq: Two times a day (BID) | ORAL | Status: DC
Start: 2017-04-01 — End: 2017-04-03
  Administered 2017-04-01 – 2017-04-03 (×6): 12.5 mg via ORAL
  Filled 2017-03-31 (×6): qty 1

## 2017-03-31 MED ORDER — LABETALOL HCL 5 MG/ML IV SOLN
5.0000 mg | INTRAVENOUS | Status: DC | PRN
  Filled 2017-03-31: qty 4

## 2017-03-31 MED ORDER — ACETAMINOPHEN 325 MG PO TABS: 650.0000 mg | ORAL_TABLET | Freq: Four times a day (QID) | ORAL | Status: DC | PRN

## 2017-03-31 MED ORDER — ENOXAPARIN SODIUM 30 MG/0.3ML ~~LOC~~ SOLN
30.0000 mg | Freq: Every day | SUBCUTANEOUS | Status: DC
  Administered 2017-04-01: 30 mg via SUBCUTANEOUS
  Filled 2017-03-31: qty 0.3

## 2017-03-31 MED ORDER — QUETIAPINE FUMARATE 25 MG PO TABS
12.5000 mg | ORAL_TABLET | Freq: Every day | ORAL | Status: DC
  Administered 2017-04-01 – 2017-04-02 (×3): 12.5 mg via ORAL
  Filled 2017-03-31 (×3): qty 1

## 2017-03-31 MED ORDER — SODIUM CHLORIDE 0.9 % IV BOLUS (SEPSIS)
1000.0000 mL | Freq: Once | INTRAVENOUS | Status: AC
  Administered 2017-03-31: 1000 mL via INTRAVENOUS

## 2017-03-31 MED ORDER — SODIUM CHLORIDE 0.9 % IV SOLN
INTRAVENOUS | Status: DC
  Administered 2017-03-31: 22:00:00 via INTRAVENOUS

## 2017-03-31 MED ORDER — ISOSORBIDE MONONITRATE ER 30 MG PO TB24: 30.0000 mg | ORAL_TABLET | Freq: Every day | ORAL | Status: DC

## 2017-03-31 MED ORDER — ISOSORBIDE MONONITRATE ER 30 MG PO TB24
30.0000 mg | ORAL_TABLET | Freq: Every day | ORAL | Status: DC
  Administered 2017-04-01 – 2017-04-03 (×3): 30 mg via ORAL
  Filled 2017-03-31 (×3): qty 1

## 2017-03-31 MED ORDER — PRAVASTATIN SODIUM 20 MG PO TABS
20.0000 mg | ORAL_TABLET | Freq: Every day | ORAL | Status: DC
Start: 2017-04-01 — End: 2017-04-03
  Administered 2017-04-01 – 2017-04-03 (×3): 20 mg via ORAL
  Filled 2017-03-31 (×3): qty 1

## 2017-03-31 MED ORDER — POTASSIUM CHLORIDE ER 10 MEQ PO TBCR: 10.0000 meq | EXTENDED_RELEASE_TABLET | Freq: Every day | ORAL | Status: DC

## 2017-03-31 MED ORDER — ONDANSETRON HCL 4 MG PO TABS: 4.0000 mg | ORAL_TABLET | Freq: Four times a day (QID) | ORAL | Status: DC | PRN

## 2017-03-31 NOTE — ED Notes (Signed)
Patient eat a few bite of her sand wich. Pt state she is done eating she does not eat much.

## 2017-03-31 NOTE — ED Notes (Signed)
ED TO INPATIENT HANDOFF REPORT  Name/Age/Gender Carla Conway 83 y.o. female  Code Status    Code Status Orders  (From admission, onward)        Start     Ordered   03/31/17 2207  Full code  Continuous     03/31/17 2208    Code Status History    Date Active Date Inactive Code Status Order ID Comments User Context   10/29/2016 15:34 10/30/2016 16:34 Full Code [id number]  Elmarie Shiley, MD Inpatient   05/09/2016 00:49 05/11/2016 19:04 Full Code [id number]  Roney Jaffe, MD Inpatient      Home/SNF/Other Skilled nursing facility  Chief Complaint fall/head injury  Level of Care/Admitting Diagnosis ED Disposition    ED Disposition Condition [place]: [facility] [[id number]]  Level of Care: Med-Surg [16]  Diagnosis: Generalized weakness [623762]  Admitting Physician: Etta Quill [[id number]]  Attending Physician: Etta Quill [[id number]]  PT Class (Do Not Modify): Observation [104]  PT Acc Code (Do Not Modify): Observation [10022]       Medical History Past Medical History:  Diagnosis Date  . Asthma   . CAD (coronary artery disease)   . Hyperlipidemia   . Hypertension     Allergies Allergies  Allergen Reactions  . Rituximab Other (See Comments)    Upset stomach and blood pressure dropped    IV Location/Drains/Wounds Patient Lines/Drains/Airways Status   Active Line/Drains/Airways    Name:   Placement date:   Placement time:   Site:   Days:   Implanted Port 10/23/16 Right Chest   10/23/16    0954    Chest   159   Peripheral IV 03/31/17 Left Antecubital   03/31/17    -    Antecubital   less than 1   Peripheral IV 03/31/17 Left Forearm   03/31/17    2053    Forearm   less than 1          Labs/Imaging Results for orders placed or performed during the hospital encounter of 03/31/17 (from the past 48 hour(s))  Basic metabolic panel     Status: Abnormal   Collection Time: 03/31/17  1:05 PM  Result Value Ref Range    Sodium 142 135 - 145 mmol/L   Potassium 3.8 3.5 - 5.1 mmol/L   Chloride 103 101 - 111 mmol/L   CO2 26 22 - 32 mmol/L   Glucose, Bld 110 (H) 65 - 99 mg/dL   BUN 44 (H) 6 - 20 mg/dL   Creatinine, Ser 1.34 (H) 0.44 - 1.00 mg/dL   Calcium 9.5 8.9 - 10.3 mg/dL   GFR calc non Af Amer 34 (L) >60 mL/min   GFR calc Af Amer 40 (L) >60 mL/min    Comment: (NOTE) The eGFR has been calculated using the CKD EPI equation. This calculation has not been validated in all clinical situations. eGFR's persistently <60 mL/min signify possible Chronic Kidney Disease.    Anion gap 13 5 - 15  CBC     Status: Abnormal   Collection Time: 03/31/17  1:05 PM  Result Value Ref Range   WBC 16.3 (H) 4.0 - 10.5 K/uL   RBC 4.77 3.87 - 5.11 MIL/uL   Hemoglobin 14.0 12.0 - 15.0 g/dL   HCT 42.6 36.0 - 46.0 %   MCV 89.3 78.0 - 100.0 fL   MCH 29.4 26.0 - 34.0 pg   MCHC 32.9 30.0 - 36.0 g/dL  RDW 15.2 11.5 - 15.5 %   Platelets 313 150 - 400 K/uL  CBG monitoring, ED     Status: Abnormal   Collection Time: 03/31/17  1:18 PM  Result Value Ref Range   Glucose-Capillary 108 (H) 65 - 99 mg/dL  Urinalysis, Routine w reflex microscopic     Status: Abnormal   Collection Time: 03/31/17  4:12 PM  Result Value Ref Range   Color, Urine YELLOW YELLOW   APPearance HAZY (A) CLEAR   Specific Gravity, Urine 1.016 1.005 - 1.030   pH 5.0 5.0 - 8.0   Glucose, UA NEGATIVE NEGATIVE mg/dL   Hgb urine dipstick SMALL (A) NEGATIVE   Bilirubin Urine NEGATIVE NEGATIVE   Ketones, ur 5 (A) NEGATIVE mg/dL   Protein, ur 100 (A) NEGATIVE mg/dL   Nitrite NEGATIVE NEGATIVE   Leukocytes, UA NEGATIVE NEGATIVE   RBC / HPF 0-5 0 - 5 RBC/hpf   WBC, UA 0-5 0 - 5 WBC/hpf   Bacteria, UA FEW (A) NONE SEEN   Squamous Epithelial / LPF 0-5 (A) NONE SEEN   Mucus PRESENT    Hyaline Casts, UA PRESENT   CK     Status: Abnormal   Collection Time: 03/31/17  6:10 PM  Result Value Ref Range   Total CK 1,025 (H) 38 - 234 U/L   Ct Head Wo  Contrast  Result Date: 03/31/2017 CLINICAL DATA:  Patient coming from [facility] with c/o fall. Pt was found on the kitchen floor. Pt denies being on blood thinner. Pt was confused went ems go to the house but she is on her baseline at this time. Pt very HOH. Pt c/o dizziness went standing. EXAM: CT HEAD WITHOUT CONTRAST CT CERVICAL SPINE WITHOUT CONTRAST TECHNIQUE: Multidetector CT imaging of the head and cervical spine was performed following the standard protocol without intravenous contrast. Multiplanar CT image reconstructions of the cervical spine were also generated. COMPARISON:  05/08/2016 FINDINGS: CT HEAD FINDINGS Brain: No evidence of acute infarction, hemorrhage, hydrocephalus, extra-axial collection or mass lesion/mass effect. The ventricles and sulci are mildly enlarged reflecting age appropriate volume loss. Patchy white matter hypoattenuation is also noted consistent with moderate chronic microvascular ischemic change. Vascular: No hyperdense vessel or unexpected calcification. Skull: Normal. Negative for fracture or focal lesion. Sinuses/Orbits: Globes and orbits are unremarkable. Visualized sinuses and mastoid air cells are clear. Other: None. CT CERVICAL SPINE FINDINGS Alignment: Slight reversal the normal cervical lordosis, apex at C3-C4. No spondylolisthesis. Skull base and vertebrae: No acute fracture. No primary bone lesion or focal pathologic process. Soft tissues and spinal canal: No prevertebral fluid or swelling. No visible canal hematoma. Disc levels: There is marked loss of disc height at C3-C4 through C6-C7 with endplate sclerosis and spondylotic disc bulging with endplate spurring. Facet degenerative changes noted most prominently on the left at C2-C3. There is no convincing disc herniation. Upper chest: There are numerous prominent bilateral neck lymph nodes, overall decreased in size is with compared to the prior CT. Lung apices are clear. Other: None. IMPRESSION: HEAD  CT 1. No acute intracranial abnormalities. 2. Age appropriate volume loss and moderate chronic microvascular ischemic change. CERVICAL CT 1. No fracture or acute finding. Electronically Signed   By: Lajean Manes M.D.   On: 03/31/2017 14:35   Ct Cervical Spine Wo Contrast  Result Date: 03/31/2017 CLINICAL DATA:  Patient coming from [facility] with c/o fall. Pt was found on the kitchen floor. Pt denies being on blood thinner. Pt was confused  went ems go to the house but she is on her baseline at this time. Pt very HOH. Pt c/o dizziness went standing. EXAM: CT HEAD WITHOUT CONTRAST CT CERVICAL SPINE WITHOUT CONTRAST TECHNIQUE: Multidetector CT imaging of the head and cervical spine was performed following the standard protocol without intravenous contrast. Multiplanar CT image reconstructions of the cervical spine were also generated. COMPARISON:  05/08/2016 FINDINGS: CT HEAD FINDINGS Brain: No evidence of acute infarction, hemorrhage, hydrocephalus, extra-axial collection or mass lesion/mass effect. The ventricles and sulci are mildly enlarged reflecting age appropriate volume loss. Patchy white matter hypoattenuation is also noted consistent with moderate chronic microvascular ischemic change. Vascular: No hyperdense vessel or unexpected calcification. Skull: Normal. Negative for fracture or focal lesion. Sinuses/Orbits: Globes and orbits are unremarkable. Visualized sinuses and mastoid air cells are clear. Other: None. CT CERVICAL SPINE FINDINGS Alignment: Slight reversal the normal cervical lordosis, apex at C3-C4. No spondylolisthesis. Skull base and vertebrae: No acute fracture. No primary bone lesion or focal pathologic process. Soft tissues and spinal canal: No prevertebral fluid or swelling. No visible canal hematoma. Disc levels: There is marked loss of disc height at C3-C4 through C6-C7 with endplate sclerosis and spondylotic disc bulging with endplate spurring. Facet degenerative changes  noted most prominently on the left at C2-C3. There is no convincing disc herniation. Upper chest: There are numerous prominent bilateral neck lymph nodes, overall decreased in size is with compared to the prior CT. Lung apices are clear. Other: None. IMPRESSION: HEAD CT 1. No acute intracranial abnormalities. 2. Age appropriate volume loss and moderate chronic microvascular ischemic change. CERVICAL CT 1. No fracture or acute finding. Electronically Signed   By: Lajean Manes M.D.   On: 03/31/2017 14:35   Dg Shoulder Left  Result Date: 03/31/2017 CLINICAL DATA:  Left shoulder pain due to a fall today. Initial encounter. EXAM: LEFT SHOULDER - 2+ VIEW COMPARISON:  None. FINDINGS: There is no acute bony or joint abnormality. Mild acromioclavicular degenerative change is noted. The glenohumeral joint appears normal. No focal bony lesion. IMPRESSION: No acute abnormality. Electronically Signed   By: Inge Rise M.D.   On: 03/31/2017 13:01   Dg Knee Complete 4 Views Right  Result Date: 03/31/2017 CLINICAL DATA:  Right knee pain due to a fall today. Initial encounter. EXAM: RIGHT KNEE - COMPLETE 4+ VIEW COMPARISON:  None. FINDINGS: There is no acute bony or joint abnormality. No joint effusion. Chondrocalcinosis is identified. Atherosclerotic vascular disease is present. IMPRESSION: No acute abnormality. Electronically Signed   By: Inge Rise M.D.   On: 03/31/2017 13:02   Dg Hip Unilat With Pelvis 2-3 Views Left  Result Date: 03/31/2017 CLINICAL DATA:  83 year old female with fall and left hip pain. EXAM: DG HIP (WITH OR WITHOUT PELVIS) 2-3V LEFT COMPARISON:  None. FINDINGS: There is no acute fracture or dislocation. The bones are osteopenic. Mild bilateral hip arthritic changes. There is degenerative changes of the lower lumbar spine. The soft tissues are unremarkable. IMPRESSION: No fracture or dislocation. Electronically Signed   By: Anner Crete M.D.   On: 03/31/2017 22:53    Pending  Labs Unresulted Labs (From admission, onward)   Start     Ordered   04/01/17 0500  Comprehensive metabolic panel  Tomorrow morning,   R    Comments:  I changed bmet to cmet b/c I need LFTs to verify leukeran.    03/31/17 2211      Vitals/Pain Today's Vitals   03/31/17 2143 03/31/17 2145 03/31/17 2300 03/31/17  2304  BP:  (!) 181/80 (!) 181/144 (!) 178/75  Pulse:  77 78 74  Resp:  (!) _0 Temp:      TempSrc:      SpO2:  98% 95% 97%  Weight:      Height:      PainSc: 0-No pain       Isolation Precautions No active isolations  Medications Medications  Ipratropium-Albuterol (COMBIVENT) respimat 2 puff (not administered)  lidocaine-prilocaine (EMLA) cream 1 application (not administered)  pravastatin (PRAVACHOL) tablet 20 mg (not administered)  metoprolol tartrate (LOPRESSOR) tablet 12.5 mg (not administered)  montelukast (SINGULAIR) tablet 10 mg (not administered)  pantoprazole (PROTONIX) EC tablet 40 mg (not administered)  promethazine (PHENERGAN) tablet 12.5 mg (not administered)  QUEtiapine (SEROQUEL) tablet 12.5 mg (not administered)  predniSONE (DELTASONE) tablet 5 mg (not administered)  chlorambucil (LEUKERAN) tablet 2 mg (not administered)  isosorbide mononitrate (IMDUR) 24 hr tablet 30 mg (not administered)  acetaminophen (TYLENOL) tablet 650 mg (not administered)    Or  acetaminophen (TYLENOL) suppository 650 mg (not administered)  ondansetron (ZOFRAN) tablet 4 mg (not administered)    Or  ondansetron (ZOFRAN) injection 4 mg (not administered)  enoxaparin (LOVENOX) injection 40 mg (not administered)  labetalol (NORMODYNE,TRANDATE) injection 5-10 mg (not administered)  acetaminophen (TYLENOL) tablet 1,000 mg (1,000 mg Oral Given 03/31/17 1259)  sodium chloride 0.9 % bolus 1,000 mL (0 mLs Intravenous Stopped 03/31/17 1806)  sodium chloride 0.9 % bolus 500 mL (0 mLs Intravenous Stopped 03/31/17 2144)  sodium chloride 0.9 % bolus 500 mL (0 mLs Intravenous  Stopped 03/31/17 2144)    Mobility non-ambulatory

## 2017-03-31 NOTE — ED Provider Notes (Signed)
[facility] DEPT Provider Note   CSN: [id number] Arrival date & time: 03/31/17  1124     History   Chief Complaint No chief complaint on file.   HPI Carla Conway is a 83 y.o. female.  HPI 83 year old female who presents after fall.  She has a history of hypertension, hyperlipidemia, and coronary artery disease.  She is not anticoagulated.  She presents from her nursing facility.  States that she fell and hit her head this morning after getting up from bed too quickly.  States that she lost her balance when she suddenly stood up, falling and hitting the back of her head.  She did not have any loss of consciousness.  Primarily complains of left shoulder and right knee pain and posterior headache.  Denies feeling dizzy or lightheaded or having near syncopal symptoms or syncope.  Denies any recent illnesses including fever, generalized weakness or fatigue, dysuria or urinary frequency, abdominal pain, vomiting or diarrhea, chest pain or difficulty breathing.   Past Medical History:  Diagnosis Date  . Asthma   . CAD (coronary artery disease)   . Hyperlipidemia   . Hypertension     Patient Active Problem List   Diagnosis Date Noted  . External hemorrhoids without complication 88/75/7972  . Port-A-Cath in place 01/06/2017  . Gastritis 01/06/2017  . Poor memory 12/11/2016  . Dysuria 11/13/2016  . Anaphylaxis 10/29/2016  . Anaphylactoid reaction 10/29/2016  . Goals of care, counseling/discussion 10/28/2016  . Chronic kidney disease, stage III (moderate) ([place]) 10/17/2016  . 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 Neutropenia ([place]) 09/23/2016  . CLL (chronic lymphocytic leukemia) ([place]) 05/11/2016  . Acute on chronic diastolic heart failure ([place]) 05/10/2016  . Influenza A 05/09/2016  . Lymphadenopathy of head and neck region 05/09/2016  . Lymphadenopathy, thoracic 05/09/2016  . COPD (chronic obstructive pulmonary disease) ([place]) 05/09/2016  .  Debility 05/09/2016  . Syncopal episodes 05/09/2016  . Syncope 05/08/2016  . HTN (hypertension) 05/18/2013  . CAD in native artery 05/18/2013    Past Surgical History:  Procedure Laterality Date  . CATARACT EXTRACTION    . CORONARY ANGIOPLASTY  10 -15 years ago   stent in [place] ,[place]  . IR FLUORO GUIDE PORT INSERTION RIGHT  10/23/2016  . IR US GUIDE VASC ACCESS RIGHT  10/23/2016    OB History    No data available       Home Medications    Prior to Admission medications   Medication Sig Start Date End Date Taking? Authorizing Provider  acetaminophen (TYLENOL) 325 MG tablet Take 650 mg by mouth every 6 (six) hours as needed for mild pain.   Yes [provider]  COMBIVENT RESPIMAT 20-100 MCG/ACT AERS respimat Take 2 puffs by mouth every 6 (six) hours as needed for wheezing or shortness of breath.  04/16/13  Yes [provider]  isosorbide mononitrate (IMDUR) 30 MG 24 hr tablet Take 30 mg by mouth daily.  05/09/13  Yes [provider]  LEUKERAN 2 MG tablet TAKE 1 TABLET (2 MG TOTAL) BY MOUTH DAILY. GIVE ON AN EMPTY STOMACH 1 HOUR BEFORE OR 2 HOURS AFTER MEALS. 01/27/17  Yes Gorsuch, Ni, MD  lidocaine-prilocaine (EMLA) cream Apply 1 application topically as needed. Patient taking differently: Apply 1 application topically as needed (For port-a-cath.).  10/27/16  Yes Gorsuch, Ni, MD  lovastatin (MEVACOR) 40 MG tablet Take 40 mg by mouth at bedtime.  05/09/13  Yes [provider]  metoprolol tartrate (LOPRESSOR)  25 MG tablet Take 0.5 tablets (12.5 mg total) by mouth 2 (two) times daily. 03/13/17 05/12/17 Yes Gorsuch, Ni, MD  montelukast (SINGULAIR) 10 MG tablet Take 10 mg by mouth at bedtime.  05/09/13  Yes [provider]  omeprazole (PRILOSEC) 20 MG capsule Take 1 capsule (20 mg total) by mouth daily. 01/06/17  Yes Gorsuch, Ni, MD  ondansetron (ZOFRAN) 8 MG tablet Take 1 tablet (8 mg total) by mouth every 8 (eight) hours as needed for nausea. 10/27/16   Yes Gorsuch, Ni, MD  potassium chloride (K-DUR) 10 MEQ tablet Take 10 mEq by mouth daily. 03/24/16  Yes [provider]  predniSONE (DELTASONE) 5 MG tablet Take 1 tablet (5 mg total) by mouth daily with breakfast. 11/13/16  Yes Gorsuch, Ni, MD  promethazine (PHENERGAN) 12.5 MG tablet Take 1 tablet (12.5 mg total) by mouth every 6 (six) hours as needed for nausea. 10/27/16  Yes Gorsuch, Ni, MD  QUEtiapine (SEROQUEL) 25 MG tablet Take 12.5 mg by mouth at bedtime.    Yes [provider]    Family History Family History  Problem Relation Age of Onset  . Cancer Father        colon    Social History Social History   Tobacco Use  . Smoking status: Former Smoker    Types: Cigarettes    Start date: 05/18/1983  . Smokeless tobacco: Never Used  Substance Use Topics  . Alcohol use: No  . Drug use: No     Allergies   Rituximab   Review of Systems Review of Systems  Constitutional: Negative for fever.  Respiratory: Negative for shortness of breath.   Cardiovascular: Negative for chest pain.  Gastrointestinal: Negative for abdominal pain.  Neurological: Positive for headaches.  All other systems reviewed and are negative.    Physical Exam Updated Vital Signs BP (!) 149/71   Pulse 77   Temp (!) 97.5 F (36.4 C) (Oral)   Resp 19   Ht 5' 6"  (1.676 m)   Wt 70.3 kg (155 lb)   SpO2 97%   BMI 25.02 kg/m   Physical Exam Physical Exam  Nursing note and vitals reviewed. Constitutional: Well developed, well nourished, non-toxic, and in no acute distress Head: Normocephalic and atraumatic.  Mouth/Throat: Oropharynx is clear and moist.  Neck: Normal range of motion. Neck supple. cervical collar in place Cardiovascular: Normal rate and regular rhythm.   Pulmonary/Chest: Effort normal and breath sounds normal.  Abdominal: Soft. There is no tenderness. There is no rebound and no guarding.  Musculoskeletal: Limited range of motion of the left shoulder and right hip due  to pain.  No obvious deformities.  Bruising is noted over the right knee. Neurological: Alert, no facial droop, fluent speech, moves all extremities symmetrically, intact sensation throughout to light touch, moves all extremities symmetrically, equal bilateral hand grip, no pronator drift, no drift in lower extremities, PERRL Skin: Skin is warm and dry.  Psychiatric: Cooperative   ED Treatments / Results  Labs (all labs ordered are listed, but only abnormal results are displayed) Labs Reviewed  BASIC METABOLIC PANEL - Abnormal; Notable for the following components:      Result Value   Glucose, Bld 110 (*)    BUN 44 (*)    Creatinine, Ser 1.34 (*)    GFR calc non Af Amer 34 (*)    GFR calc Af Amer 40 (*)    All other components within normal limits  CBC - Abnormal; Notable for the following  components:   WBC 16.3 (*)    All other components within normal limits  URINALYSIS, ROUTINE W REFLEX MICROSCOPIC - Abnormal; Notable for the following components:   APPearance HAZY (*)    Hgb urine dipstick SMALL (*)    Ketones, ur 5 (*)    Protein, ur 100 (*)    Bacteria, UA FEW (*)    Squamous Epithelial / LPF 0-5 (*)    All other components within normal limits  CBG MONITORING, ED - Abnormal; Notable for the following components:   Glucose-Capillary 108 (*)    All other components within normal limits    EKG  EKG Interpretation  Date/Time:  Tuesday March 31 2017 11:52:02 EST Ventricular Rate:  81 PR Interval:    QRS Duration: 99 QT Interval:  482 QTC Calculation: 560 R Axis:   29 Text Interpretation:  Sinus rhythm Atrial premature complex RSR' in V1 or V2, right VCD or RVH Prolonged QT interval similar to last EKG  Confirmed by Brantley Stage [id number]) on 03/31/2017 11:55:00 AM Also confirmed by Brantley Stage [phone]), editor Laurena Spies [phone])  on 03/31/2017 1:49:07 PM       Radiology Ct Head Wo Contrast  Result Date: 03/31/2017 CLINICAL DATA:  Patient coming from [facility] with c/o fall. Pt was found on the kitchen floor. Pt denies being on blood thinner. Pt was confused went ems go to the house but she is on her baseline at this time. Pt very HOH. Pt c/o dizziness went standing. EXAM: CT HEAD WITHOUT CONTRAST CT CERVICAL SPINE WITHOUT CONTRAST TECHNIQUE: Multidetector CT imaging of the head and cervical spine was performed following the standard protocol without intravenous contrast. Multiplanar CT image reconstructions of the cervical spine were also generated. COMPARISON:  05/08/2016 FINDINGS: CT HEAD FINDINGS Brain: No evidence of acute infarction, hemorrhage, hydrocephalus, extra-axial collection or mass lesion/mass effect. The ventricles and sulci are mildly enlarged reflecting age appropriate volume loss. Patchy white matter hypoattenuation is also noted consistent with moderate chronic microvascular ischemic change. Vascular: No hyperdense vessel or unexpected calcification. Skull: Normal. Negative for fracture or focal lesion. Sinuses/Orbits: Globes and orbits are unremarkable. Visualized sinuses and mastoid air cells are clear. Other: None. CT CERVICAL SPINE FINDINGS Alignment: Slight reversal the normal cervical lordosis, apex at C3-C4. No spondylolisthesis. Skull base and vertebrae: No acute fracture. No primary bone lesion or focal pathologic process. Soft tissues and spinal canal: No prevertebral fluid or swelling. No visible canal hematoma. Disc levels: There is marked loss of disc height at C3-C4 through C6-C7 with endplate sclerosis and spondylotic disc bulging with endplate spurring. Facet degenerative changes noted most prominently on the left at C2-C3. There is no convincing disc herniation. Upper chest: There are numerous prominent bilateral neck lymph nodes, overall decreased in size is with compared to the prior CT. Lung apices are clear. Other: None. IMPRESSION: HEAD CT 1. No acute intracranial abnormalities. 2. Age appropriate volume loss and  moderate chronic microvascular ischemic change. CERVICAL CT 1. No fracture or acute finding. Electronically Signed   By: Lajean Manes M.D.   On: 03/31/2017 14:35   Ct Cervical Spine Wo Contrast  Result Date: 03/31/2017 CLINICAL DATA:  Patient coming from [facility] with c/o fall. Pt was found on the kitchen floor. Pt denies being on blood thinner. Pt was confused went ems go to the house but she is on her baseline at this time. Pt very HOH. Pt c/o dizziness went standing. EXAM: CT HEAD WITHOUT CONTRAST  CT CERVICAL SPINE WITHOUT CONTRAST TECHNIQUE: Multidetector CT imaging of the head and cervical spine was performed following the standard protocol without intravenous contrast. Multiplanar CT image reconstructions of the cervical spine were also generated. COMPARISON:  05/08/2016 FINDINGS: CT HEAD FINDINGS Brain: No evidence of acute infarction, hemorrhage, hydrocephalus, extra-axial collection or mass lesion/mass effect. The ventricles and sulci are mildly enlarged reflecting age appropriate volume loss. Patchy white matter hypoattenuation is also noted consistent with moderate chronic microvascular ischemic change. Vascular: No hyperdense vessel or unexpected calcification. Skull: Normal. Negative for fracture or focal lesion. Sinuses/Orbits: Globes and orbits are unremarkable. Visualized sinuses and mastoid air cells are clear. Other: None. CT CERVICAL SPINE FINDINGS Alignment: Slight reversal the normal cervical lordosis, apex at C3-C4. No spondylolisthesis. Skull base and vertebrae: No acute fracture. No primary bone lesion or focal pathologic process. Soft tissues and spinal canal: No prevertebral fluid or swelling. No visible canal hematoma. Disc levels: There is marked loss of disc height at C3-C4 through C6-C7 with endplate sclerosis and spondylotic disc bulging with endplate spurring. Facet degenerative changes noted most prominently on the left at C2-C3. There is no convincing disc  herniation. Upper chest: There are numerous prominent bilateral neck lymph nodes, overall decreased in size is with compared to the prior CT. Lung apices are clear. Other: None. IMPRESSION: HEAD CT 1. No acute intracranial abnormalities. 2. Age appropriate volume loss and moderate chronic microvascular ischemic change. CERVICAL CT 1. No fracture or acute finding. Electronically Signed   By: Lajean Manes M.D.   On: 03/31/2017 14:35   Dg Shoulder Left  Result Date: 03/31/2017 CLINICAL DATA:  Left shoulder pain due to a fall today. Initial encounter. EXAM: LEFT SHOULDER - 2+ VIEW COMPARISON:  None. FINDINGS: There is no acute bony or joint abnormality. Mild acromioclavicular degenerative change is noted. The glenohumeral joint appears normal. No focal bony lesion. IMPRESSION: No acute abnormality. Electronically Signed   By: Inge Rise M.D.   On: 03/31/2017 13:01   Dg Knee Complete 4 Views Right  Result Date: 03/31/2017 CLINICAL DATA:  Right knee pain due to a fall today. Initial encounter. EXAM: RIGHT KNEE - COMPLETE 4+ VIEW COMPARISON:  None. FINDINGS: There is no acute bony or joint abnormality. No joint effusion. Chondrocalcinosis is identified. Atherosclerotic vascular disease is present. IMPRESSION: No acute abnormality. Electronically Signed   By: Inge Rise M.D.   On: 03/31/2017 13:02    Procedures Procedures (including critical care time)  Medications Ordered in ED Medications  acetaminophen (TYLENOL) tablet 1,000 mg (1,000 mg Oral Given 03/31/17 1259)  sodium chloride 0.9 % bolus 1,000 mL (1,000 mLs Intravenous New Bag/Given 03/31/17 1622)     Initial Impression / Assessment and Plan / ED Course  I have reviewed the triage vital signs and the nursing notes.  Pertinent labs & imaging results that were available during my care of the patient were reviewed by me and considered in my medical decision making (see chart for details).     83 year old female who presents  after fall. Although she told nurse that she may have been dizzy, she denies being dizzy and states she got up too quickly and fell. She is well appearing. Mentation appears normal.   CT head and cervical spine w/o acute injuries. Xrays of the left shoulder and right knee visualized and w/o acute fracture. Blood work with mild AKI and given IVF. Pending UA. Will also attempt to ambulate. Likely discharge with ambulates appropriately. Dr. Gustavus Messing to follow-up.  Final Clinical Impressions(s) / ED Diagnoses   Final diagnoses:  None    ED Discharge Orders    None       Forde Dandy, MD 03/31/17 1718

## 2017-03-31 NOTE — ED Notes (Signed)
Pt had to use the restroom but could not wait in time to be put on the bedpan. Cleaned Pt up and put fresh depend on Pt. Will try again.

## 2017-03-31 NOTE — ED Provider Notes (Signed)
4:09 PM Care assumed from Dr. Oleta Mouse.   At time of transfer of care, patient is awaiting reassessment after urinalysis and rehydration.  Patient had a fall and had reassuring imaging workup.  Patient is awaiting rehydration and urinalysis.  Plan of care is to treat UTI if one is discovered and patient is able to ambulate safely, will discharge home.  Patient's urinalysis did not show evidence of infection.  There were some ketones which might suggest dehydration.    Patient was reassessed by me after the laboratory testing completed.  Patient attempted ambulation with nursing and felt very lightheaded and unsteady.  Concerned patient is still dehydrated and orthostatic.  Family also provided more information and said that the fall occurred yesterday and she was down for likely greater than 12 hours on the ground.  With this in mind, a CK was added.  Patient had the CK checked after oral fluids and 1 L of normal saline.  CK found to be greater than 1000.  There is concern for mild rhabdo which may have contributed to rising creatinine.  Reviewing the patient's laboratory testing showed a slight increase in her creatinine up to 1.34 from 1.1 previously.  Although this is not acute kidney injury, I am concerned that the patient is clinically dehydrated with dry mouth, elevated CK, slightly rising creatinine in the setting of lightheadedness, falling down, and unsteadiness.  Due to patient's age and these findings, hospitalist team will be called for admission for kidney function monitoring, watching CK, and rehydration in the setting of rhabdo.  Hospitalist team will be called.   Clinical Impression: 1. Left hip pain     Disposition: Admit  This note was prepared with assistance of Dragon voice recognition software. Occasional wrong-word or sound-a-like substitutions may have occurred due to the inherent limitations of voice recognition software.       Tegeler, Gwenyth Allegra, MD 04/01/17  [phone]

## 2017-03-31 NOTE — ED Notes (Signed)
Bed: WA02 Expected date:  Expected time:  Means of arrival:  Comments: EMS-fall

## 2017-03-31 NOTE — ED Notes (Signed)
Patient transported to CT 

## 2017-03-31 NOTE — ED Triage Notes (Signed)
Patient coming from [facility] with c/o fall. Pt was found on the kitchen floor. Pt denies being on blood thinner. Pt was confused went ems go to the house but she is on her baseline at this time. Pt very HOH. Pt c/o dizziness went standing.

## 2017-03-31 NOTE — H&P (Signed)
History and Physical    Carla Conway KZL:[id number] DOB: 04-12-1926 DOA: 03/31/2017  PCP: Lujean Amel, MD  Patient coming from: Home  I have personally briefly reviewed patient's old medical records in [place]  Chief Complaint: Fall  HPI: Carla Conway is a 83 y.o. female with medical history significant of HTN, CLL, probably mild baseline dementia.  Patient presents to the ED after a fall.  Although initially sounded like she fell this AM, it sounds like she may have actually fallen yesterday and been down on ground for up to 24 hours.  Has L shoulder, R calf pain.   ED Course: X rays of R shoulder, L calf, and CT head are neg.  Mild AKI with BUN 44 creat 1.3.  CK is 1025 this evening.  Patient was given 2L NS bolus.  WBC 16k (but patient has CLL).   Review of Systems: As per HPI otherwise 10 point review of systems negative.   Past Medical History:  Diagnosis Date  . Asthma   . CAD (coronary artery disease)   . Hyperlipidemia   . Hypertension     Past Surgical History:  Procedure Laterality Date  . CATARACT EXTRACTION    . CORONARY ANGIOPLASTY  10 -15 years ago   stent in [place] ,[place]  . IR FLUORO GUIDE PORT INSERTION RIGHT  10/23/2016  . IR US GUIDE VASC ACCESS RIGHT  10/23/2016     reports that she has quit smoking. Her smoking use included cigarettes. She started smoking about 33 years ago. she has never used smokeless tobacco. She reports that she does not drink alcohol or use drugs.  Allergies  Allergen Reactions  . Rituximab Other (See Comments)    Upset stomach and blood pressure dropped    Family History  Problem Relation Age of Onset  . Cancer Father        colon     Prior to Admission medications   Medication Sig Start Date End Date Taking? Authorizing Provider  acetaminophen (TYLENOL) 325 MG tablet Take 650 mg by mouth every 6 (six) hours as needed for mild pain.   Yes [provider]  COMBIVENT RESPIMAT 20-100 MCG/ACT AERS  respimat Take 2 puffs by mouth every 6 (six) hours as needed for wheezing or shortness of breath.  04/16/13  Yes [provider]  isosorbide mononitrate (IMDUR) 30 MG 24 hr tablet Take 30 mg by mouth daily.  05/09/13  Yes [provider]  LEUKERAN 2 MG tablet TAKE 1 TABLET (2 MG TOTAL) BY MOUTH DAILY. GIVE ON AN EMPTY STOMACH 1 HOUR BEFORE OR 2 HOURS AFTER MEALS. 01/27/17  Yes Gorsuch, Ni, MD  lidocaine-prilocaine (EMLA) cream Apply 1 application topically as needed. Patient taking differently: Apply 1 application topically as needed (For port-a-cath.).  10/27/16  Yes Gorsuch, Ni, MD  lovastatin (MEVACOR) 40 MG tablet Take 40 mg by mouth at bedtime.  05/09/13  Yes [provider]  metoprolol tartrate (LOPRESSOR) 25 MG tablet Take 0.5 tablets (12.5 mg total) by mouth 2 (two) times daily. 03/13/17 05/12/17 Yes Gorsuch, Ni, MD  montelukast (SINGULAIR) 10 MG tablet Take 10 mg by mouth at bedtime.  05/09/13  Yes [provider]  omeprazole (PRILOSEC) 20 MG capsule Take 1 capsule (20 mg total) by mouth daily. 01/06/17  Yes Gorsuch, Ni, MD  ondansetron (ZOFRAN) 8 MG tablet Take 1 tablet (8 mg total) by mouth every 8 (eight) hours as needed for nausea. 10/27/16  Yes Heath Lark, MD  potassium chloride (K-DUR) 10 MEQ tablet Take 10 mEq by mouth daily. 03/24/16  Yes [provider]  predniSONE (DELTASONE) 5 MG tablet Take 1 tablet (5 mg total) by mouth daily with breakfast. 11/13/16  Yes Gorsuch, Ni, MD  promethazine (PHENERGAN) 12.5 MG tablet Take 1 tablet (12.5 mg total) by mouth every 6 (six) hours as needed for nausea. 10/27/16  Yes Gorsuch, Ni, MD  QUEtiapine (SEROQUEL) 25 MG tablet Take 12.5 mg by mouth at bedtime.    Yes [provider]    Physical Exam: Vitals:   03/31/17 2052 03/31/17 2054 03/31/17 2101 03/31/17 2145  BP: (!) 191/72 (!) 185/65 (!) 187/77 (!) 181/80  Pulse: 72  83 77  Resp: (!) 24  (!) 23 (!) 22  Temp: 98.7 F (37.1 C)     TempSrc: Oral       SpO2: 99%  99% 98%  Weight:      Height:        Constitutional: NAD, calm, comfortable Eyes: PERRL, lids and conjunctivae normal ENMT: Mucous membranes are moist. Posterior pharynx clear of any exudate or lesions.Normal dentition.  Neck: normal, supple, no masses, no thyromegaly Respiratory: clear to auscultation bilaterally, no wheezing, no crackles. Normal respiratory effort. No accessory muscle use.  Cardiovascular: Regular rate and rhythm, no murmurs / rubs / gallops. No extremity edema. 2+ pedal pulses. No carotid bruits.  Abdomen: no tenderness, no masses palpated. No hepatosplenomegaly. Bowel sounds positive.  Musculoskeletal: no clubbing / cyanosis. No joint deformity upper and lower extremities. Good ROM, no contractures. Normal muscle tone.  Skin: no rashes, lesions, ulcers. No induration Neurologic: CN 2-12 grossly intact. Sensation intact, DTR normal. Strength 5/5 in all 4.  Psychiatric: Normal judgment and insight. Alert and oriented x 3. Normal mood.    Labs on Admission: I have personally reviewed following labs and imaging studies  CBC: Recent Labs  Lab 03/31/17 1305  WBC 16.3*  HGB 14.0  HCT 42.6  MCV 89.3  PLT 323   Basic Metabolic Panel: Recent Labs  Lab 03/31/17 1305  NA 142  K 3.8  CL 103  CO2 26  GLUCOSE 110*  BUN 44*  CREATININE 1.34*  CALCIUM 9.5   GFR: Estimated Creatinine Clearance: 27.2 mL/min (A) (by C-G formula based on SCr of 1.34 mg/dL (H)). Liver Function Tests: No results for input(s): AST, ALT, ALKPHOS, BILITOT, PROT, ALBUMIN in the last 168 hours. No results for input(s): LIPASE, AMYLASE in the last 168 hours. No results for input(s): AMMONIA in the last 168 hours. Coagulation Profile: No results for input(s): INR, PROTIME in the last 168 hours. Cardiac Enzymes: Recent Labs  Lab 03/31/17 1810  CKTOTAL 1,025*   BNP (last 3 results) No results for input(s): PROBNP in the last 8760 hours. HbA1C: No results for input(s):  HGBA1C in the last 72 hours. CBG: Recent Labs  Lab 03/31/17 1318  GLUCAP 108*   Lipid Profile: No results for input(s): CHOL, HDL, LDLCALC, TRIG, CHOLHDL, LDLDIRECT in the last 72 hours. Thyroid Function Tests: No results for input(s): TSH, T4TOTAL, FREET4, T3FREE, THYROIDAB in the last 72 hours. Anemia Panel: No results for input(s): VITAMINB12, FOLATE, FERRITIN, TIBC, IRON, RETICCTPCT in the last 72 hours. Urine analysis:    Component Value Date/Time   COLORURINE YELLOW 03/31/2017 1612   APPEARANCEUR HAZY (A) 03/31/2017 1612   LABSPEC 1.016 03/31/2017 1612   LABSPEC 1.015 11/13/2016 0848   PHURINE 5.0 03/31/2017 1612   GLUCOSEU NEGATIVE 03/31/2017 1612   GLUCOSEU Negative 11/13/2016  0848   HGBUR SMALL (A) 03/31/2017 1612   BILIRUBINUR NEGATIVE 03/31/2017 1612   BILIRUBINUR Negative 11/13/2016 0848   KETONESUR 5 (A) 03/31/2017 1612   PROTEINUR 100 (A) 03/31/2017 1612   UROBILINOGEN 0.2 11/13/2016 0848   NITRITE NEGATIVE 03/31/2017 1612   LEUKOCYTESUR NEGATIVE 03/31/2017 1612   LEUKOCYTESUR Large 11/13/2016 0848    Radiological Exams on Admission: Ct Head Wo Contrast  Result Date: 03/31/2017 CLINICAL DATA:  Patient coming from [facility] with c/o fall. Pt was found on the kitchen floor. Pt denies being on blood thinner. Pt was confused went ems go to the house but she is on her baseline at this time. Pt very HOH. Pt c/o dizziness went standing. EXAM: CT HEAD WITHOUT CONTRAST CT CERVICAL SPINE WITHOUT CONTRAST TECHNIQUE: Multidetector CT imaging of the head and cervical spine was performed following the standard protocol without intravenous contrast. Multiplanar CT image reconstructions of the cervical spine were also generated. COMPARISON:  05/08/2016 FINDINGS: CT HEAD FINDINGS Brain: No evidence of acute infarction, hemorrhage, hydrocephalus, extra-axial collection or mass lesion/mass effect. The ventricles and sulci are mildly enlarged reflecting age appropriate  volume loss. Patchy white matter hypoattenuation is also noted consistent with moderate chronic microvascular ischemic change. Vascular: No hyperdense vessel or unexpected calcification. Skull: Normal. Negative for fracture or focal lesion. Sinuses/Orbits: Globes and orbits are unremarkable. Visualized sinuses and mastoid air cells are clear. Other: None. CT CERVICAL SPINE FINDINGS Alignment: Slight reversal the normal cervical lordosis, apex at C3-C4. No spondylolisthesis. Skull base and vertebrae: No acute fracture. No primary bone lesion or focal pathologic process. Soft tissues and spinal canal: No prevertebral fluid or swelling. No visible canal hematoma. Disc levels: There is marked loss of disc height at C3-C4 through C6-C7 with endplate sclerosis and spondylotic disc bulging with endplate spurring. Facet degenerative changes noted most prominently on the left at C2-C3. There is no convincing disc herniation. Upper chest: There are numerous prominent bilateral neck lymph nodes, overall decreased in size is with compared to the prior CT. Lung apices are clear. Other: None. IMPRESSION: HEAD CT 1. No acute intracranial abnormalities. 2. Age appropriate volume loss and moderate chronic microvascular ischemic change. CERVICAL CT 1. No fracture or acute finding. Electronically Signed   By: Lajean Manes M.D.   On: 03/31/2017 14:35   Ct Cervical Spine Wo Contrast  Result Date: 03/31/2017 CLINICAL DATA:  Patient coming from [facility] with c/o fall. Pt was found on the kitchen floor. Pt denies being on blood thinner. Pt was confused went ems go to the house but she is on her baseline at this time. Pt very HOH. Pt c/o dizziness went standing. EXAM: CT HEAD WITHOUT CONTRAST CT CERVICAL SPINE WITHOUT CONTRAST TECHNIQUE: Multidetector CT imaging of the head and cervical spine was performed following the standard protocol without intravenous contrast. Multiplanar CT image reconstructions of the cervical  spine were also generated. COMPARISON:  05/08/2016 FINDINGS: CT HEAD FINDINGS Brain: No evidence of acute infarction, hemorrhage, hydrocephalus, extra-axial collection or mass lesion/mass effect. The ventricles and sulci are mildly enlarged reflecting age appropriate volume loss. Patchy white matter hypoattenuation is also noted consistent with moderate chronic microvascular ischemic change. Vascular: No hyperdense vessel or unexpected calcification. Skull: Normal. Negative for fracture or focal lesion. Sinuses/Orbits: Globes and orbits are unremarkable. Visualized sinuses and mastoid air cells are clear. Other: None. CT CERVICAL SPINE FINDINGS Alignment: Slight reversal the normal cervical lordosis, apex at C3-C4. No spondylolisthesis. Skull base and vertebrae: No acute fracture.  No primary bone lesion or focal pathologic process. Soft tissues and spinal canal: No prevertebral fluid or swelling. No visible canal hematoma. Disc levels: There is marked loss of disc height at C3-C4 through C6-C7 with endplate sclerosis and spondylotic disc bulging with endplate spurring. Facet degenerative changes noted most prominently on the left at C2-C3. There is no convincing disc herniation. Upper chest: There are numerous prominent bilateral neck lymph nodes, overall decreased in size is with compared to the prior CT. Lung apices are clear. Other: None. IMPRESSION: HEAD CT 1. No acute intracranial abnormalities. 2. Age appropriate volume loss and moderate chronic microvascular ischemic change. CERVICAL CT 1. No fracture or acute finding. Electronically Signed   By: Lajean Manes M.D.   On: 03/31/2017 14:35   Dg Shoulder Left  Result Date: 03/31/2017 CLINICAL DATA:  Left shoulder pain due to a fall today. Initial encounter. EXAM: LEFT SHOULDER - 2+ VIEW COMPARISON:  None. FINDINGS: There is no acute bony or joint abnormality. Mild acromioclavicular degenerative change is noted. The glenohumeral joint appears normal. No  focal bony lesion. IMPRESSION: No acute abnormality. Electronically Signed   By: Inge Rise M.D.   On: 03/31/2017 13:01   Dg Knee Complete 4 Views Right  Result Date: 03/31/2017 CLINICAL DATA:  Right knee pain due to a fall today. Initial encounter. EXAM: RIGHT KNEE - COMPLETE 4+ VIEW COMPARISON:  None. FINDINGS: There is no acute bony or joint abnormality. No joint effusion. Chondrocalcinosis is identified. Atherosclerotic vascular disease is present. IMPRESSION: No acute abnormality. Electronically Signed   By: Inge Rise M.D.   On: 03/31/2017 13:02    EKG: Independently reviewed.  Assessment/Plan Principal Problem:   Generalized weakness Active Problems:   HTN (hypertension)   Debility   CLL (chronic lymphocytic leukemia) (HCC)   AKI (acute kidney injury) (HCC)   Dehydration    1. Generalized weakness - following fall, this on top of chronic debility 1. PT eval in AM 2. Will check X ray of L hip and pelvis given ongoing L hip pain 2. Dehydration causing mild AKI 1. Got 2L IVF in ED 2. Intake and output 3. Repeat BMP in AM 4. Hold scheduled daily potassium 3. HTN - 1. Resume home meds 2. PRN labetalol 4. CLL - 1. Missed last 2 days of Lukeran / prednisone 2. Will resume with tomorrows dose 3. Tomorrows dose was actually supposed to be the last for the next 2 weeks, probably will want to touch base with Dr. Alvy Bimler in AM to see if 2 further days should be given or not. 4. Putting in consult into epic to oncology (not called), as per routine when admitting a chemo patient  DVT prophylaxis: Lovenox Code Status: Full Family Communication: Family at bedside Disposition Plan: Home after admit Consults called: left consult in Epic for oncology Admission status: Place in [place], [place] [facility] Pager [phone]  If 7AM-7PM, please contact day team taking care of patient [email or web address] Password [facility]  03/31/2017, 10:43 PM

## 2017-03-31 NOTE — ED Notes (Signed)
Patient ambulated in the room with difficulties. Pt was unsteady and afraid of walking .patient state she does not want to fall again. Pt c/o of sore legs.

## 2017-03-31 NOTE — ED Notes (Signed)
Placed Pt in [facility] catheter to try and obtain urine sample. Performed Pericare prior to placement.

## 2017-03-31 NOTE — Progress Notes (Signed)
Rx Brief note:  Lovenox  Rx adjusted Lovenox to 30 mg daily in pt with CrCl<30 ml/min  Thanks Dorrene German 03/31/2017 11:47 PM

## 2017-03-31 NOTE — ED Notes (Signed)
Gave Pt a ham sandwich and water.

## 2017-04-01 DIAGNOSIS — R748 Abnormal levels of other serum enzymes: Secondary | ICD-10-CM

## 2017-04-01 DIAGNOSIS — R531 Weakness: Secondary | ICD-10-CM | POA: Diagnosis not present

## 2017-04-01 LAB — COMPREHENSIVE METABOLIC PANEL
ALK PHOS: 53 U/L (ref 38–126)
ALT: 23 U/L (ref 14–54)
AST: 47 U/L — ABNORMAL HIGH (ref 15–41)
Albumin: 3.5 g/dL (ref 3.5–5.0)
Anion gap: 7 (ref 5–15)
BILIRUBIN TOTAL: 0.9 mg/dL (ref 0.3–1.2)
BUN: 40 mg/dL — ABNORMAL HIGH (ref 6–20)
CALCIUM: 8.4 mg/dL — AB (ref 8.9–10.3)
CO2: 21 mmol/L — AB (ref 22–32)
CREATININE: 1.12 mg/dL — AB (ref 0.44–1.00)
Chloride: 111 mmol/L (ref 101–111)
GFR calc non Af Amer: 43 mL/min — ABNORMAL LOW (ref >60)
GFR, EST AFRICAN AMERICAN: 49 mL/min — AB (ref >60)
Glucose, Bld: 88 mg/dL (ref 65–99)
Potassium: 3.3 mmol/L — ABNORMAL LOW (ref 3.5–5.1)
SODIUM: 139 mmol/L (ref 135–145)
Total Protein: 5.9 g/dL — ABNORMAL LOW (ref 6.5–8.1)

## 2017-04-01 MED ORDER — SODIUM CHLORIDE 0.9 % IV SOLN
INTRAVENOUS | Status: DC
  Administered 2017-04-01 – 2017-04-02 (×3): via INTRAVENOUS

## 2017-04-01 MED ORDER — POTASSIUM CHLORIDE CRYS ER 20 MEQ PO TBCR
40.0000 meq | EXTENDED_RELEASE_TABLET | Freq: Once | ORAL | Status: AC
  Administered 2017-04-01: 40 meq via ORAL
  Filled 2017-04-01: qty 2

## 2017-04-01 MED ORDER — ENOXAPARIN SODIUM 40 MG/0.4ML ~~LOC~~ SOLN
40.0000 mg | Freq: Every day | SUBCUTANEOUS | Status: DC
  Administered 2017-04-01 – 2017-04-02 (×2): 40 mg via SUBCUTANEOUS
  Filled 2017-04-01 (×2): qty 0.4

## 2017-04-01 MED ORDER — PROMETHAZINE HCL 25 MG PO TABS
12.5000 mg | ORAL_TABLET | Freq: Four times a day (QID) | ORAL | Status: DC | PRN
Start: 2017-04-01 — End: 2017-04-03

## 2017-04-01 NOTE — Care Management Note (Signed)
Case Management Note  Patient Details  Name: Carla Conway MRN: [id number] Date of Birth: 05-12-1926  Subjective/Objective:                  fall  Action/Plan: Date: April 01, 2017 Velva Harman, BSN, [place], [place] Chart and notes review for patient progress and needs. Will follow for case management and discharge needs. Next review date: [id number] Expected Discharge Date:                  Expected Discharge Plan:  Home/Self Care  In-House Referral:     Discharge planning Services  CM Consult  Post Acute Care Choice:    Choice offered to:     DME Arranged:    DME Agency:     HH Arranged:    HH Agency:     Status of Service:  In process, will continue to follow  If discussed at Long Length of Stay Meetings, dates discussed:    Additional Comments:  Leeroy Cha, RN 04/01/2017, 8:48 AM

## 2017-04-01 NOTE — Progress Notes (Addendum)
PROGRESS NOTE    Carla Conway Carla Conway & Carla Conway  XJO:[id number] DOB: 29-Jul-1926 DOA: 03/31/2017 PCP: Lujean Amel, MD   Brief Narrative:  Patient is a 83 year old female with past medical history of hypertension, CLL, mild baseline dementia who presented to the emergency department after a fall at home with.  She was found on the floor.  Patient was admitted for the management of dehydration, acute kidney injury and elevated CK level.  Assessment & Plan:   Principal Problem:   Generalized weakness Active Problems:   HTN (hypertension)   Debility   CLL (chronic lymphocytic leukemia) (HCC)   AKI (acute kidney injury) (HCC)   Dehydration  Generalized weakness: Patient looks chronically debilitated.  Advanced  Age.  Weakness contributed to the fall.  Physical therapy has been ordered. Imagings did not show any fractures, dislocations Might need  nursing home placement.  Social worker will be consulted after physical therapy evaluation.  Dehydration/AKI: We will continue IV fluids we will continue monitor the kidney function.  Hypertension: Currently mildly hypertensive.  We will continue to monitor her blood pressure.  On antihypertensives at home which will resume  CLL: Was on lukeran and prednisone at home.  Reported that she takes Montserrat one week on and one week off.  We will continue prednisone for now. Patient follows with oncology as an outpatient.  Confusion: Patient looks pleasantly confused.  Mild dementia might be contributing.  Family noted that since she is confused once she gets urinary tract infection.  Her UA on presentation was also history of UTI.  Will check urine culture.  Hypokalemia: Currently being supplemented.  Leukocytosis: Patient has history of CLL.  Most likely this is chronic.  She was also on steroids.  Elevated CK: We will continue IV fluids.  Will check levels tomorrow  DVT prophylaxis: Lovenox Code Status: Full Family Communication: None present on the  bedside Disposition Plan: Home with home health versus SNF   Consultants: None  Procedures:None  Antimicrobials:None  Subjective: Patient seen and examined the patient this morning.  Looks very comfortable.  Pleasantly confused.  Denies any complaints.  Objective: Vitals:   04/01/17 0009 04/01/17 0159 04/01/17 0432 04/01/17 1244  BP: (!) 193/78 (!) 163/89 (!) 162/87 (!) 171/77  Pulse: 77 72 96 78  Resp: 20 18 18 20   Temp: 98 F (36.7 C) 98.2 F (36.8 C) 98 F (36.7 C) 98 F (36.7 C)  TempSrc: Oral Oral Oral Oral  SpO2: 98% 98% 96% 97%  Weight: 72.1 kg (159 lb)     Height: 5' 6"  (1.676 m)       Intake/Output Summary (Last 24 hours) at 04/01/2017 1451 Last data filed at 04/01/2017 4158 Gross per 24 hour  Intake 2320 ml  Output 200 ml  Net 2120 ml   Filed Weights   03/31/17 1148 04/01/17 0009  Weight: 70.3 kg (155 lb) 72.1 kg (159 lb)    Examination:  General exam: Appears calm and comfortable ,Not in distress,average built Respiratory system: Bilateral equal air entry, normal vesicular breath sounds, no wheezes or crackles  Cardiovascular system: S1 & S2 heard, RRR. No JVD, murmurs, rubs, gallops or clicks. No pedal edema. Gastrointestinal system: Abdomen is nondistended, soft and nontender. No organomegaly or masses felt. Normal bowel sounds heard. Central nervous system: Alert but not  oriented. No focal neurological deficits. Extremities: No edema, no clubbing ,no cyanosis, distal peripheral pulses palpable. Skin: No cyanosis,No pallor,No Rash, shallow ulcer on the left leg Psychiatry: Alert but not oriented  Data Reviewed: I have personally reviewed following labs and imaging studies  CBC: Recent Labs  Lab 03/31/17 1305  WBC 16.3*  HGB 14.0  HCT 42.6  MCV 89.3  PLT 938   Basic Metabolic Panel: Recent Labs  Lab 03/31/17 1305 04/01/17 0346  NA 142 139  K 3.8 3.3*  CL 103 111  CO2 26 21*  GLUCOSE 110* 88  BUN 44* 40*  CREATININE 1.34* 1.12*   CALCIUM 9.5 8.4*   GFR: Estimated Creatinine Clearance: 35.3 mL/min (A) (by C-G formula based on SCr of 1.12 mg/dL (H)). Liver Function Tests: Recent Labs  Lab 04/01/17 0346  AST 47*  ALT 23  ALKPHOS 53  BILITOT 0.9  PROT 5.9*  ALBUMIN 3.5   No results for input(s): LIPASE, AMYLASE in the last 168 hours. No results for input(s): AMMONIA in the last 168 hours. Coagulation Profile: No results for input(s): INR, PROTIME in the last 168 hours. Cardiac Enzymes: Recent Labs  Lab 03/31/17 1810  CKTOTAL 1,025*   BNP (last 3 results) No results for input(s): PROBNP in the last 8760 hours. HbA1C: No results for input(s): HGBA1C in the last 72 hours. CBG: Recent Labs  Lab 03/31/17 1318  GLUCAP 108*   Lipid Profile: No results for input(s): CHOL, HDL, LDLCALC, TRIG, CHOLHDL, LDLDIRECT in the last 72 hours. Thyroid Function Tests: No results for input(s): TSH, T4TOTAL, FREET4, T3FREE, THYROIDAB in the last 72 hours. Anemia Panel: No results for input(s): VITAMINB12, FOLATE, FERRITIN, TIBC, IRON, RETICCTPCT in the last 72 hours. Sepsis Labs: No results for input(s): PROCALCITON, LATICACIDVEN in the last 168 hours.  No results found for this or any previous visit (from the past 240 hour(s)).       Radiology Studies: Ct Head Wo Contrast  Result Date: 03/31/2017 CLINICAL DATA:  Patient coming from [facility] with c/o fall. Pt was found on the kitchen floor. Pt denies being on blood thinner. Pt was confused went ems go to the house but she is on her baseline at this time. Pt very HOH. Pt c/o dizziness went standing. EXAM: CT HEAD WITHOUT CONTRAST CT CERVICAL SPINE WITHOUT CONTRAST TECHNIQUE: Multidetector CT imaging of the head and cervical spine was performed following the standard protocol without intravenous contrast. Multiplanar CT image reconstructions of the cervical spine were also generated. COMPARISON:  05/08/2016 FINDINGS: CT HEAD FINDINGS Brain: No  evidence of acute infarction, hemorrhage, hydrocephalus, extra-axial collection or mass lesion/mass effect. The ventricles and sulci are mildly enlarged reflecting age appropriate volume loss. Patchy white matter hypoattenuation is also noted consistent with moderate chronic microvascular ischemic change. Vascular: No hyperdense vessel or unexpected calcification. Skull: Normal. Negative for fracture or focal lesion. Sinuses/Orbits: Globes and orbits are unremarkable. Visualized sinuses and mastoid air cells are clear. Other: None. CT CERVICAL SPINE FINDINGS Alignment: Slight reversal the normal cervical lordosis, apex at C3-C4. No spondylolisthesis. Skull base and vertebrae: No acute fracture. No primary bone lesion or focal pathologic process. Soft tissues and spinal canal: No prevertebral fluid or swelling. No visible canal hematoma. Disc levels: There is marked loss of disc height at C3-C4 through C6-C7 with endplate sclerosis and spondylotic disc bulging with endplate spurring. Facet degenerative changes noted most prominently on the left at C2-C3. There is no convincing disc herniation. Upper chest: There are numerous prominent bilateral neck lymph nodes, overall decreased in size is with compared to the prior CT. Lung apices are clear. Other: None. IMPRESSION: HEAD CT 1. No acute intracranial abnormalities. 2. Age appropriate  volume loss and moderate chronic microvascular ischemic change. CERVICAL CT 1. No fracture or acute finding. Electronically Signed   By: Lajean Manes M.D.   On: 03/31/2017 14:35   Ct Cervical Spine Wo Contrast  Result Date: 03/31/2017 CLINICAL DATA:  Patient coming from [facility] with c/o fall. Pt was found on the kitchen floor. Pt denies being on blood thinner. Pt was confused went ems go to the house but she is on her baseline at this time. Pt very HOH. Pt c/o dizziness went standing. EXAM: CT HEAD WITHOUT CONTRAST CT CERVICAL SPINE WITHOUT CONTRAST TECHNIQUE:  Multidetector CT imaging of the head and cervical spine was performed following the standard protocol without intravenous contrast. Multiplanar CT image reconstructions of the cervical spine were also generated. COMPARISON:  05/08/2016 FINDINGS: CT HEAD FINDINGS Brain: No evidence of acute infarction, hemorrhage, hydrocephalus, extra-axial collection or mass lesion/mass effect. The ventricles and sulci are mildly enlarged reflecting age appropriate volume loss. Patchy white matter hypoattenuation is also noted consistent with moderate chronic microvascular ischemic change. Vascular: No hyperdense vessel or unexpected calcification. Skull: Normal. Negative for fracture or focal lesion. Sinuses/Orbits: Globes and orbits are unremarkable. Visualized sinuses and mastoid air cells are clear. Other: None. CT CERVICAL SPINE FINDINGS Alignment: Slight reversal the normal cervical lordosis, apex at C3-C4. No spondylolisthesis. Skull base and vertebrae: No acute fracture. No primary bone lesion or focal pathologic process. Soft tissues and spinal canal: No prevertebral fluid or swelling. No visible canal hematoma. Disc levels: There is marked loss of disc height at C3-C4 through C6-C7 with endplate sclerosis and spondylotic disc bulging with endplate spurring. Facet degenerative changes noted most prominently on the left at C2-C3. There is no convincing disc herniation. Upper chest: There are numerous prominent bilateral neck lymph nodes, overall decreased in size is with compared to the prior CT. Lung apices are clear. Other: None. IMPRESSION: HEAD CT 1. No acute intracranial abnormalities. 2. Age appropriate volume loss and moderate chronic microvascular ischemic change. CERVICAL CT 1. No fracture or acute finding. Electronically Signed   By: Lajean Manes M.D.   On: 03/31/2017 14:35   Dg Shoulder Left  Result Date: 03/31/2017 CLINICAL DATA:  Left shoulder pain due to a fall today. Initial encounter. EXAM: LEFT  SHOULDER - 2+ VIEW COMPARISON:  None. FINDINGS: There is no acute bony or joint abnormality. Mild acromioclavicular degenerative change is noted. The glenohumeral joint appears normal. No focal bony lesion. IMPRESSION: No acute abnormality. Electronically Signed   By: Inge Rise M.D.   On: 03/31/2017 13:01   Dg Knee Complete 4 Views Right  Result Date: 03/31/2017 CLINICAL DATA:  Right knee pain due to a fall today. Initial encounter. EXAM: RIGHT KNEE - COMPLETE 4+ VIEW COMPARISON:  None. FINDINGS: There is no acute bony or joint abnormality. No joint effusion. Chondrocalcinosis is identified. Atherosclerotic vascular disease is present. IMPRESSION: No acute abnormality. Electronically Signed   By: Inge Rise M.D.   On: 03/31/2017 13:02   Dg Hip Unilat With Pelvis 2-3 Views Left  Result Date: 03/31/2017 CLINICAL DATA:  83 year old female with fall and left hip pain. EXAM: DG HIP (WITH OR WITHOUT PELVIS) 2-3V LEFT COMPARISON:  None. FINDINGS: There is no acute fracture or dislocation. The bones are osteopenic. Mild bilateral hip arthritic changes. There is degenerative changes of the lower lumbar spine. The soft tissues are unremarkable. IMPRESSION: No fracture or dislocation. Electronically Signed   By: Anner Crete M.D.   On: 03/31/2017 22:53  Scheduled Meds: . chlorambucil  2 mg Oral Daily  . enoxaparin (LOVENOX) injection  40 mg Subcutaneous QHS  . isosorbide mononitrate  30 mg Oral Daily  . metoprolol tartrate  12.5 mg Oral BID  . montelukast  10 mg Oral QHS  . pantoprazole  40 mg Oral Daily  . pravastatin  20 mg Oral q1800  . predniSONE  5 mg Oral Q breakfast  . QUEtiapine  12.5 mg Oral QHS   Continuous Infusions: . sodium chloride 100 mL/hr at 04/01/17 0814     LOS: 0 days    Time spent: 25 mins    Modest Draeger Jodie Echevaria, MD [facility] Pager [phone]  If 7PM-7AM, please contact night-coverage [email or web address] Password [email or web address] 04/01/2017, 2:51  PM

## 2017-04-01 NOTE — Evaluation (Signed)
Physical Therapy Evaluation Patient Details Name: Carla Conway MRN: [id number] DOB: 04-Oct-1926 Today's Date: 04/01/2017   History of Present Illness  Pt admitted s/p fall and with multiple bruises but all X-rays negative.  Pt with hx of CAD and dementia  Clinical Impression  Pt admitted as above and presenting with functional mobility limitations 2* generalized weakness, post fall discomfort, balance deficits and poor safety awareness.  Pt would benefit from follow up at SNF level unless 24/7 sup/assist can be arranged at current living arrangement.    Follow Up Recommendations SNF    Equipment Recommendations  None recommended by PT    Recommendations for Other Services       Precautions / Restrictions Precautions Precautions: Fall Restrictions Weight Bearing Restrictions: No      Mobility  Bed Mobility Overal bed mobility: Needs Assistance Bed Mobility: Supine to Sit     Supine to sit: Min assist;Mod assist     General bed mobility comments: Assist to manage LEs over EOB and to bring trunk to upright  Transfers Overall transfer level: Needs assistance Equipment used: Rolling walker (2 wheeled) Transfers: Sit to/from Stand Sit to Stand: Min assist;Mod assist;From elevated surface         General transfer comment: cues for safe transition position and use of UEs to self assist;  Physical assist to bring wt up and fwd and to balance in initial standing  Ambulation/Gait Ambulation/Gait assistance: Min assist Ambulation Distance (Feet): 180 Feet Assistive device: Rolling walker (2 wheeled) Gait Pattern/deviations: Step-through pattern;Decreased step length - right;Decreased step length - left;Shuffle;Staggering left;Staggering right Gait velocity: decr Gait velocity interpretation: Below normal speed for age/gender General Gait Details: cues for pace, posture and position from RW.  Physical assist for balance and RW management  Stairs             Wheelchair Mobility    Modified Rankin (Stroke Patients Only)       Balance Overall balance assessment: Needs assistance Sitting-balance support: No upper extremity supported;Feet supported Sitting balance-Leahy Scale: Fair     Standing balance support: Bilateral upper extremity supported Standing balance-Leahy Scale: Poor                               Pertinent Vitals/Pain Pain Assessment: Faces Faces Pain Scale: Hurts little more Pain Location: My leg muscles Pain Descriptors / Indicators: Sore Pain Intervention(s): Limited activity within patient's tolerance;Monitored during session    Home Living Family/patient expects to be discharged to:: Assisted living               Home Equipment: Walker - 2 wheels;Cane - single point Additional Comments: No family present to confirm pt reports of living situation.     Prior Function Level of Independence: Independent with assistive device(s)         Comments: pt reports living independently, using rollator and performing all ADLs independently. No family present to confirm PLF     Hand Dominance        Extremity/Trunk Assessment   Upper Extremity Assessment Upper Extremity Assessment: Generalized weakness    Lower Extremity Assessment Lower Extremity Assessment: Generalized weakness    Cervical / Trunk Assessment Cervical / Trunk Assessment: Kyphotic  Communication   Communication: No difficulties  Cognition Arousal/Alertness: Awake/alert Behavior During Therapy: WFL for tasks assessed/performed Overall Cognitive Status: History of cognitive impairments - at baseline  General Comments      Exercises     Assessment/Plan    PT Assessment Patient needs continued PT services  PT Problem List Decreased strength;Decreased activity tolerance;Decreased balance;Decreased mobility;Decreased cognition;Decreased knowledge of use of  DME;Pain;Decreased safety awareness       PT Treatment Interventions DME instruction;Gait training;Functional mobility training;Therapeutic activities;Balance training;Therapeutic exercise;Cognitive remediation;Patient/family education    PT Goals (Current goals can be found in the Care Plan section)  Acute Rehab PT Goals Patient Stated Goal: I want to walk PT Goal Formulation: Patient unable to participate in goal setting Time For Goal Achievement: 04/15/18 Potential to Achieve Goals: Fair    Frequency Min 3X/week   Barriers to discharge Decreased caregiver support Pt is in IND living setting and currently at high risk for falls and would benefit from 24/7 sup/assist    Co-evaluation               AM-PAC PT "6 Clicks" Daily Activity  Outcome Measure Difficulty turning over in bed (including adjusting bedclothes, sheets and blankets)?: Unable Difficulty moving from lying on back to sitting on the side of the bed? : Unable Difficulty sitting down on and standing up from a chair with arms (e.g., wheelchair, bedside commode, etc,.)?: Unable Help needed moving to and from a bed to chair (including a wheelchair)?: A Lot Help needed walking in hospital room?: A Little Help needed climbing 3-5 steps with a railing? : A Lot 6 Click Score: 10    End of Session Equipment Utilized During Treatment: Gait belt Activity Tolerance: Patient tolerated treatment well Patient left: in chair;with call bell/phone within reach;with chair alarm set Nurse Communication: Mobility status PT Visit Diagnosis: Unsteadiness on feet (R26.81);Muscle weakness (generalized) (M62.81);Difficulty in walking, not elsewhere classified (R26.2)    Time: 1540-1606 PT Time Calculation (min) (ACUTE ONLY): 26 min   Charges:   PT Evaluation $PT Eval Low Complexity: 1 Low PT Treatments $Gait Training: 8-22 mins   PT G Codes:   PT G-Codes **NOT FOR INPATIENT CLASS** Functional Assessment Tool Used: AM-PAC 6  Clicks Basic Mobility Functional Limitation: Mobility: Walking and moving around Mobility: Walking and Moving Around Current Status (P0051): At least 60 percent but less than 80 percent impaired, limited or restricted Mobility: Walking and Moving Around Goal Status [phone]): At least 20 percent but less than 40 percent impaired, limited or restricted    Pg [phone]   Darcell Yacoub 04/01/2017, 5:37 PM

## 2017-04-02 DIAGNOSIS — R531 Weakness: Secondary | ICD-10-CM | POA: Diagnosis not present

## 2017-04-02 LAB — BASIC METABOLIC PANEL
Anion gap: 6 (ref 5–15)
BUN: 29 mg/dL — AB (ref 6–20)
CHLORIDE: 107 mmol/L (ref 101–111)
CO2: 25 mmol/L (ref 22–32)
Calcium: 8.3 mg/dL — ABNORMAL LOW (ref 8.9–10.3)
Creatinine, Ser: 0.96 mg/dL (ref 0.44–1.00)
GFR calc non Af Amer: 51 mL/min — ABNORMAL LOW (ref >60)
GFR, EST AFRICAN AMERICAN: 59 mL/min — AB (ref >60)
Glucose, Bld: 99 mg/dL (ref 65–99)
POTASSIUM: 3.3 mmol/L — AB (ref 3.5–5.1)
SODIUM: 138 mmol/L (ref 135–145)

## 2017-04-02 LAB — CBC WITH DIFFERENTIAL/PLATELET
Band Neutrophils: 0 %
Basophils Absolute: 0 10*3/uL (ref 0.0–0.1)
Basophils Relative: 0 %
Blasts: 0 %
EOS PCT: 1 %
Eosinophils Absolute: 0.2 10*3/uL (ref 0.0–0.7)
HCT: 35.7 % — ABNORMAL LOW (ref 36.0–46.0)
Hemoglobin: 11.5 g/dL — ABNORMAL LOW (ref 12.0–15.0)
LYMPHS ABS: 19.3 10*3/uL — AB (ref 0.7–4.0)
Lymphocytes Relative: 87 %
MCH: 29 pg (ref 26.0–34.0)
MCHC: 32.2 g/dL (ref 30.0–36.0)
MCV: 90.2 fL (ref 78.0–100.0)
MONOS PCT: 5 %
Metamyelocytes Relative: 0 %
Monocytes Absolute: 1.1 10*3/uL — ABNORMAL HIGH (ref 0.1–1.0)
Myelocytes: 0 %
NEUTROS ABS: 1.6 10*3/uL — AB (ref 1.7–7.7)
NEUTROS PCT: 7 %
NRBC: 0 /100{WBCs}
Other: 0 %
Platelets: 272 10*3/uL (ref 150–400)
Promyelocytes Absolute: 0 %
RBC: 3.96 MIL/uL (ref 3.87–5.11)
RDW: 15.4 % (ref 11.5–15.5)
WBC: 22.2 10*3/uL — AB (ref 4.0–10.5)

## 2017-04-02 LAB — MAGNESIUM: MAGNESIUM: 1.8 mg/dL (ref 1.7–2.4)

## 2017-04-02 LAB — CK: Total CK: 420 U/L — ABNORMAL HIGH (ref 38–234)

## 2017-04-02 MED ORDER — LORAZEPAM 2 MG/ML IJ SOLN
0.5000 mg | Freq: Once | INTRAMUSCULAR | Status: AC
  Administered 2017-04-02: 0.5 mg via INTRAVENOUS
  Filled 2017-04-02: qty 1

## 2017-04-02 MED ORDER — POTASSIUM CHLORIDE CRYS ER 20 MEQ PO TBCR
40.0000 meq | EXTENDED_RELEASE_TABLET | Freq: Once | ORAL | Status: AC
  Administered 2017-04-02: 40 meq via ORAL
  Filled 2017-04-02: qty 2

## 2017-04-02 NOTE — Progress Notes (Signed)
Date: April 02, 2017 Velva Harman, BSN, [place], [place] Chart and notes review for patient progress and needs. Will follow for case management and discharge needs. Next review date: [id number]

## 2017-04-02 NOTE — Progress Notes (Signed)
Patient did not sleep all night. She was more confused than the previous evening. She is completely unaware of where she is and has poor safety awareness. She has tried to get out of bed numerous times, whereas the previous night she remembered she had fallen and chose to stay in bed.

## 2017-04-02 NOTE — Progress Notes (Signed)
PROGRESS NOTE    Carla Conway [facility]  IBB:[id number] DOB: 09-Aug-1926 DOA: 03/31/2017 PCP: Lujean Amel, MD   Brief Narrative:  Patient is a 83 year old female with past medical history of hypertension, CLL, mild baseline dementia who presented to the emergency department after a fall at home with.  She was found on the floor.  Patient was admitted for the management of dehydration, acute kidney injury and elevated CK level.  Assessment & Plan:   Principal Problem:   Generalized weakness Active Problems:   HTN (hypertension)   Debility   CLL (chronic lymphocytic leukemia) (HCC)   AKI (acute kidney injury) (HCC)   Dehydration   Elevated CK  Generalized weakness: Patient looks chronically debilitated.  Advanced  Age.  Weakness contributed to the fall.  Physical therapy evaluated the patient and recommended skilled nursing facility. Imagings did not show any fractures, dislocations Education officer, museum on board.  Patient will be discharged to skilled nursing facility.  Dehydration/AKI: We will continue monitor the kidney function.  AKI has resolved.  Hypertension:  We will continue to monitor her blood pressure.  On antihypertensives at home which will resume  CLL: Was on lukeran and prednisone at home.  Reported that she takes Montserrat one week on and one week off.  We will continue prednisone for now. Patient follows with oncology as an outpatient.  Confusion: Patient looks pleasantly confused.  Mild dementia might be contributing.  Family noted that since she is confused once she gets urinary tract infection.  Her UA on presentation was not suggestive of  UTI.  Will check urine culture.  Hypokalemia: Currently being supplemented.  Leukocytosis: Patient has history of CLL.  Most likely this is chronic.  She was also on steroids.  Elevated CK: Improved  Patient is medically stable to be discharged to skilled nursing facility whenever the bed is available.  DVT prophylaxis: Lovenox Code  Status: Full Family Communication: None present on the bedside Disposition Plan: Home with home health versus SNF   Consultants: None  Procedures:None  Antimicrobials:None  Subjective: Patient seen and examined the patient this morning.  She participated with physical therapy today.  Looks comfortable.  No new issues/events.  Objective: Vitals:   04/01/17 0432 04/01/17 1244 04/01/17 2021 04/02/17 0345  BP: (!) 162/87 (!) 171/77 (!) 154/69 (!) 148/84  Pulse: 96 78 74 87  Resp: 18 20 18 18   Temp: 98 F (36.7 C) 98 F (36.7 C) 98 F (36.7 C) 98.3 F (36.8 C)  TempSrc: Oral Oral Oral Oral  SpO2: 96% 97% 96% 97%  Weight:      Height:        Intake/Output Summary (Last 24 hours) at 04/02/2017 1420 Last data filed at 04/02/2017 4503 Gross per 24 hour  Intake 2116.67 ml  Output -  Net 2116.67 ml   Filed Weights   03/31/17 1148 04/01/17 0009  Weight: 70.3 kg (155 lb) 72.1 kg (159 lb)    Examination:  General exam: Appears calm and comfortable ,Not in distress,average built Respiratory system: Bilateral equal air entry, normal vesicular breath sounds, no wheezes or crackles  Cardiovascular system: S1 & S2 heard, RRR. No JVD, murmurs, rubs, gallops or clicks. No pedal edema. Gastrointestinal system: Abdomen is nondistended, soft and nontender. No organomegaly or masses felt. Normal bowel sounds heard. Central nervous system: Alert, pleasantly confused. No focal neurological deficits. Extremities: No edema, no clubbing ,no cyanosis, distal peripheral pulses palpable. Skin: No cyanosis,No pallor,No Rash Psychiatry: Alert, pleasantly confused  Data Reviewed: I  have personally reviewed following labs and imaging studies  CBC: Recent Labs  Lab 03/31/17 1305 04/02/17 0334  WBC 16.3* 22.2*  NEUTROABS  --  1.6*  HGB 14.0 11.5*  HCT 42.6 35.7*  MCV 89.3 90.2  PLT 313 974   Basic Metabolic Panel: Recent Labs  Lab 03/31/17 1305 04/01/17 0346 04/02/17 0334  NA 142  139 138  K 3.8 3.3* 3.3*  CL 103 111 107  CO2 26 21* 25  GLUCOSE 110* 88 99  BUN 44* 40* 29*  CREATININE 1.34* 1.12* 0.96  CALCIUM 9.5 8.4* 8.3*  MG  --   --  1.8   GFR: Estimated Creatinine Clearance: 41.2 mL/min (by C-G formula based on SCr of 0.96 mg/dL). Liver Function Tests: Recent Labs  Lab 04/01/17 0346  AST 47*  ALT 23  ALKPHOS 53  BILITOT 0.9  PROT 5.9*  ALBUMIN 3.5   No results for input(s): LIPASE, AMYLASE in the last 168 hours. No results for input(s): AMMONIA in the last 168 hours. Coagulation Profile: No results for input(s): INR, PROTIME in the last 168 hours. Cardiac Enzymes: Recent Labs  Lab 03/31/17 1810 04/02/17 0334  CKTOTAL 1,025* 420*   BNP (last 3 results) No results for input(s): PROBNP in the last 8760 hours. HbA1C: No results for input(s): HGBA1C in the last 72 hours. CBG: Recent Labs  Lab 03/31/17 1318  GLUCAP 108*   Lipid Profile: No results for input(s): CHOL, HDL, LDLCALC, TRIG, CHOLHDL, LDLDIRECT in the last 72 hours. Thyroid Function Tests: No results for input(s): TSH, T4TOTAL, FREET4, T3FREE, THYROIDAB in the last 72 hours. Anemia Panel: No results for input(s): VITAMINB12, FOLATE, FERRITIN, TIBC, IRON, RETICCTPCT in the last 72 hours. Sepsis Labs: No results for input(s): PROCALCITON, LATICACIDVEN in the last 168 hours.  No results found for this or any previous visit (from the past 240 hour(s)).       Radiology Studies: Ct Head Wo Contrast  Result Date: 03/31/2017 CLINICAL DATA:  Patient coming from [place] [facility] with c/o fall. Pt was found on the kitchen floor. Pt denies being on blood thinner. Pt was confused went ems go to the house but she is on her baseline at this time. Pt very HOH. Pt c/o dizziness went standing. EXAM: CT HEAD WITHOUT CONTRAST CT CERVICAL SPINE WITHOUT CONTRAST TECHNIQUE: Multidetector CT imaging of the head and cervical spine was performed following the standard protocol without  intravenous contrast. Multiplanar CT image reconstructions of the cervical spine were also generated. COMPARISON:  05/08/2016 FINDINGS: CT HEAD FINDINGS Brain: No evidence of acute infarction, hemorrhage, hydrocephalus, extra-axial collection or mass lesion/mass effect. The ventricles and sulci are mildly enlarged reflecting age appropriate volume loss. Patchy white matter hypoattenuation is also noted consistent with moderate chronic microvascular ischemic change. Vascular: No hyperdense vessel or unexpected calcification. Skull: Normal. Negative for fracture or focal lesion. Sinuses/Orbits: Globes and orbits are unremarkable. Visualized sinuses and mastoid air cells are clear. Other: None. CT CERVICAL SPINE FINDINGS Alignment: Slight reversal the normal cervical lordosis, apex at C3-C4. No spondylolisthesis. Skull base and vertebrae: No acute fracture. No primary bone lesion or focal pathologic process. Soft tissues and spinal canal: No prevertebral fluid or swelling. No visible canal hematoma. Disc levels: There is marked loss of disc height at C3-C4 through C6-C7 with endplate sclerosis and spondylotic disc bulging with endplate spurring. Facet degenerative changes noted most prominently on the left at C2-C3. There is no convincing disc herniation. Upper chest: There are numerous prominent bilateral neck  lymph nodes, overall decreased in size is with compared to the prior CT. Lung apices are clear. Other: None. IMPRESSION: HEAD CT 1. No acute intracranial abnormalities. 2. Age appropriate volume loss and moderate chronic microvascular ischemic change. CERVICAL CT 1. No fracture or acute finding. Electronically Signed   By: Lajean Manes M.D.   On: 03/31/2017 14:35   Ct Cervical Spine Wo Contrast  Result Date: 03/31/2017 CLINICAL DATA:  Patient coming from [place] [facility] with c/o fall. Pt was found on the kitchen floor. Pt denies being on blood thinner. Pt was confused went ems go to the house but  she is on her baseline at this time. Pt very HOH. Pt c/o dizziness went standing. EXAM: CT HEAD WITHOUT CONTRAST CT CERVICAL SPINE WITHOUT CONTRAST TECHNIQUE: Multidetector CT imaging of the head and cervical spine was performed following the standard protocol without intravenous contrast. Multiplanar CT image reconstructions of the cervical spine were also generated. COMPARISON:  05/08/2016 FINDINGS: CT HEAD FINDINGS Brain: No evidence of acute infarction, hemorrhage, hydrocephalus, extra-axial collection or mass lesion/mass effect. The ventricles and sulci are mildly enlarged reflecting age appropriate volume loss. Patchy white matter hypoattenuation is also noted consistent with moderate chronic microvascular ischemic change. Vascular: No hyperdense vessel or unexpected calcification. Skull: Normal. Negative for fracture or focal lesion. Sinuses/Orbits: Globes and orbits are unremarkable. Visualized sinuses and mastoid air cells are clear. Other: None. CT CERVICAL SPINE FINDINGS Alignment: Slight reversal the normal cervical lordosis, apex at C3-C4. No spondylolisthesis. Skull base and vertebrae: No acute fracture. No primary bone lesion or focal pathologic process. Soft tissues and spinal canal: No prevertebral fluid or swelling. No visible canal hematoma. Disc levels: There is marked loss of disc height at C3-C4 through C6-C7 with endplate sclerosis and spondylotic disc bulging with endplate spurring. Facet degenerative changes noted most prominently on the left at C2-C3. There is no convincing disc herniation. Upper chest: There are numerous prominent bilateral neck lymph nodes, overall decreased in size is with compared to the prior CT. Lung apices are clear. Other: None. IMPRESSION: HEAD CT 1. No acute intracranial abnormalities. 2. Age appropriate volume loss and moderate chronic microvascular ischemic change. CERVICAL CT 1. No fracture or acute finding. Electronically Signed   By: Lajean Manes M.D.   On:  03/31/2017 14:35   Dg Hip Unilat With Pelvis 2-3 Views Left  Result Date: 03/31/2017 CLINICAL DATA:  83 year old female with fall and left hip pain. EXAM: DG HIP (WITH OR WITHOUT PELVIS) 2-3V LEFT COMPARISON:  None. FINDINGS: There is no acute fracture or dislocation. The bones are osteopenic. Mild bilateral hip arthritic changes. There is degenerative changes of the lower lumbar spine. The soft tissues are unremarkable. IMPRESSION: No fracture or dislocation. Electronically Signed   By: Anner Crete M.D.   On: 03/31/2017 22:53        Scheduled Meds: . enoxaparin (LOVENOX) injection  40 mg Subcutaneous QHS  . isosorbide mononitrate  30 mg Oral Daily  . metoprolol tartrate  12.5 mg Oral BID  . montelukast  10 mg Oral QHS  . pantoprazole  40 mg Oral Daily  . pravastatin  20 mg Oral q1800  . predniSONE  5 mg Oral Q breakfast  . QUEtiapine  12.5 mg Oral QHS   Continuous Infusions:    LOS: 0 days    Time spent: 25 mins    Krizia Flight Jodie Echevaria, MD [facility] Pager [phone]  If 7PM-7AM, please contact night-coverage [email or web address] Password [email or web address] 04/02/2017, 2:20 PM

## 2017-04-02 NOTE — Progress Notes (Signed)
Physical Therapy Treatment Patient Details Name: Carla Conway MRN: [id number] DOB: 04-09-1926 Today's Date: 04/02/2017    History of Present Illness Pt admitted s/p fall and with multiple bruises but all X-rays negative.  Pt with hx of CAD and dementia    PT Comments    Pt pleasantly confused and easily distracted.  Required increased time and repeat VC's to stay on task.  Assisted OOB to amb to bathroom.  Assisted in bathroom then amb in hallway.  Very unsteady gait requiring physical direction with walker 25% of time.  Shift laft and right.  Poor safety cognition.  HIGH FALL RISK.  Pt will need ST Rehab prior to safely returning home.  Follow Up Recommendations  SNF     Equipment Recommendations  None recommended by PT    Recommendations for Other Services       Precautions / Restrictions Precautions Precautions: Fall Precaution Comments: AMS Restrictions Weight Bearing Restrictions: No    Mobility  Bed Mobility Overal bed mobility: Needs Assistance Bed Mobility: Supine to Sit     Supine to sit: Min assist;Mod assist     General bed mobility comments: Assist to manage LEs over EOB and to bring trunk to upright with increaased time  Transfers Overall transfer level: Needs assistance Equipment used: Rolling walker (2 wheeled) Transfers: Sit to/from Stand Sit to Stand: Min assist         General transfer comment: 75% VC's for direction and safety with turn completion with walker as pt tends to push walker to side.  Also assisted with toilet transfer.   Ambulation/Gait Ambulation/Gait assistance: Min assist;Mod assist Ambulation Distance (Feet): 145 Feet Assistive device: Rolling walker (2 wheeled) Gait Pattern/deviations: Step-to pattern;Step-through pattern;Trunk flexed;Shuffle;Staggering right;Staggering left Gait velocity: decreased   General Gait Details: very unsteady gait requiring 75% VC's for direction and safety.  Weak.  HIGH FALL RISK.   Stairs            Wheelchair Mobility    Modified Rankin (Stroke Patients Only)       Balance                                            Cognition Arousal/Alertness: Awake/alert Behavior During Therapy: WFL for tasks assessed/performed Overall Cognitive Status: History of cognitive impairments - at baseline                                 General Comments: required repeat functional VC's to stay on task      Exercises      General Comments        Pertinent Vitals/Pain Pain Assessment: No/denies pain    Home Living                      Prior Function            PT Goals (current goals can now be found in the care plan section) Progress towards PT goals: Progressing toward goals    Frequency    Min 3X/week      PT Plan Current plan remains appropriate    Co-evaluation              AM-PAC PT "6 Clicks" Daily Activity  Outcome Measure  Difficulty turning over in bed (including adjusting bedclothes, sheets and  blankets)?: Unable Difficulty moving from lying on back to sitting on the side of the bed? : Unable Difficulty sitting down on and standing up from a chair with arms (e.g., wheelchair, bedside commode, etc,.)?: Unable Help needed moving to and from a bed to chair (including a wheelchair)?: A Lot Help needed walking in hospital room?: A Little Help needed climbing 3-5 steps with a railing? : A Lot 6 Click Score: 10    End of Session Equipment Utilized During Treatment: Gait belt Activity Tolerance: Patient tolerated treatment well Patient left: in chair;with call bell/phone within reach;with chair alarm set;with family/visitor present Nurse Communication: Mobility status PT Visit Diagnosis: Unsteadiness on feet (R26.81);Muscle weakness (generalized) (M62.81);Difficulty in walking, not elsewhere classified (R26.2)     Time: 1005-1030 PT Time Calculation (min) (ACUTE ONLY): 25 min  Charges:  $Gait  Training: 8-22 mins $Therapeutic Activity: 8-22 mins                    G Codes:       {Katryna Tschirhart  PTA [facility]  Acute  Rehab Pager      [phone]

## 2017-04-02 NOTE — Progress Notes (Signed)
Patient ambulated with walker and one assist approximately 900 feet around entire department. She did fair with walker and hands on assistance to stabilize when she was paying attention. She was easily distracted and became unsteady. When distracted, she would let go of walker and I would be helping her to stay steady. Patient having visual hallucinations. She stated that she saw a woman with her gown off but that was not the case. She also thought she was at home. She also asked what she should do about the ladies in her room at night. I assured her that there were no ladies in her room and that we would be checking on her throughout the evening. She is resting comfortably in bed at this time. Confirmed bed alarm was on before leaving the room. Continue to monitor.

## 2017-04-03 DIAGNOSIS — E876 Hypokalemia: Secondary | ICD-10-CM | POA: Diagnosis present

## 2017-04-03 DIAGNOSIS — N179 Acute kidney failure, unspecified: Secondary | ICD-10-CM | POA: Diagnosis present

## 2017-04-03 DIAGNOSIS — E860 Dehydration: Secondary | ICD-10-CM | POA: Diagnosis present

## 2017-04-03 DIAGNOSIS — M79661 Pain in right lower leg: Secondary | ICD-10-CM | POA: Diagnosis present

## 2017-04-03 DIAGNOSIS — Z87891 Personal history of nicotine dependence: Secondary | ICD-10-CM | POA: Diagnosis not present

## 2017-04-03 DIAGNOSIS — M25552 Pain in left hip: Secondary | ICD-10-CM | POA: Diagnosis present

## 2017-04-03 DIAGNOSIS — I2510 Atherosclerotic heart disease of native coronary artery without angina pectoris: Secondary | ICD-10-CM | POA: Diagnosis present

## 2017-04-03 DIAGNOSIS — Z955 Presence of coronary angioplasty implant and graft: Secondary | ICD-10-CM | POA: Diagnosis not present

## 2017-04-03 DIAGNOSIS — I10 Essential (primary) hypertension: Secondary | ICD-10-CM | POA: Diagnosis present

## 2017-04-03 DIAGNOSIS — M25512 Pain in left shoulder: Secondary | ICD-10-CM | POA: Diagnosis present

## 2017-04-03 DIAGNOSIS — Z79899 Other long term (current) drug therapy: Secondary | ICD-10-CM | POA: Diagnosis not present

## 2017-04-03 DIAGNOSIS — W19XXXA Unspecified fall, initial encounter: Secondary | ICD-10-CM | POA: Diagnosis present

## 2017-04-03 DIAGNOSIS — Z7952 Long term (current) use of systemic steroids: Secondary | ICD-10-CM | POA: Diagnosis not present

## 2017-04-03 DIAGNOSIS — F0390 Unspecified dementia without behavioral disturbance: Secondary | ICD-10-CM | POA: Diagnosis present

## 2017-04-03 DIAGNOSIS — Z888 Allergy status to other drugs, medicaments and biological substances status: Secondary | ICD-10-CM | POA: Diagnosis not present

## 2017-04-03 DIAGNOSIS — E785 Hyperlipidemia, unspecified: Secondary | ICD-10-CM | POA: Diagnosis present

## 2017-04-03 DIAGNOSIS — Y92009 Unspecified place in unspecified non-institutional (private) residence as the place of occurrence of the external cause: Secondary | ICD-10-CM | POA: Diagnosis not present

## 2017-04-03 DIAGNOSIS — R5381 Other malaise: Secondary | ICD-10-CM | POA: Diagnosis present

## 2017-04-03 DIAGNOSIS — R531 Weakness: Secondary | ICD-10-CM | POA: Diagnosis not present

## 2017-04-03 DIAGNOSIS — C9110 Chronic lymphocytic leukemia of B-cell type not having achieved remission: Secondary | ICD-10-CM | POA: Diagnosis present

## 2017-04-03 LAB — CBC WITH DIFFERENTIAL/PLATELET
BASOS ABS: 0 10*3/uL (ref 0.0–0.1)
Basophils Relative: 0 %
EOS ABS: 0.2 10*3/uL (ref 0.0–0.7)
Eosinophils Relative: 1 %
HCT: 34.8 % — ABNORMAL LOW (ref 36.0–46.0)
HEMOGLOBIN: 11.1 g/dL — AB (ref 12.0–15.0)
LYMPHS PCT: 84 %
Lymphs Abs: 16.2 10*3/uL — ABNORMAL HIGH (ref 0.7–4.0)
MCH: 29.1 pg (ref 26.0–34.0)
MCHC: 31.9 g/dL (ref 30.0–36.0)
MCV: 91.3 fL (ref 78.0–100.0)
MONOS PCT: 3 %
Monocytes Absolute: 0.6 10*3/uL (ref 0.1–1.0)
NEUTROS ABS: 2.3 10*3/uL (ref 1.7–7.7)
Neutrophils Relative %: 12 %
Platelets: 250 10*3/uL (ref 150–400)
RBC: 3.81 MIL/uL — AB (ref 3.87–5.11)
RDW: 15.6 % — AB (ref 11.5–15.5)
WBC: 19.3 10*3/uL — AB (ref 4.0–10.5)

## 2017-04-03 LAB — BASIC METABOLIC PANEL
ANION GAP: 6 (ref 5–15)
BUN: 27 mg/dL — ABNORMAL HIGH (ref 6–20)
CALCIUM: 8.7 mg/dL — AB (ref 8.9–10.3)
CO2: 27 mmol/L (ref 22–32)
Chloride: 106 mmol/L (ref 101–111)
Creatinine, Ser: 0.97 mg/dL (ref 0.44–1.00)
GFR, EST AFRICAN AMERICAN: 59 mL/min — AB (ref >60)
GFR, EST NON AFRICAN AMERICAN: 51 mL/min — AB (ref >60)
GLUCOSE: 110 mg/dL — AB (ref 65–99)
POTASSIUM: 3.7 mmol/L (ref 3.5–5.1)
SODIUM: 139 mmol/L (ref 135–145)

## 2017-04-03 MED ORDER — AMLODIPINE BESYLATE 5 MG PO TABS: 5.0000 mg | ORAL_TABLET | Freq: Every day | ORAL | 0 refills | Status: DC

## 2017-04-03 MED ORDER — LEVOFLOXACIN 500 MG PO TABS: 500.0000 mg | ORAL_TABLET | Freq: Every day | ORAL | Status: DC

## 2017-04-03 MED ORDER — LEVOFLOXACIN 500 MG PO TABS: 500.0000 mg | ORAL_TABLET | Freq: Every day | ORAL | 0 refills | Status: DC

## 2017-04-03 MED ORDER — AMLODIPINE BESYLATE 5 MG PO TABS
5.0000 mg | ORAL_TABLET | Freq: Every day | ORAL | Status: DC
  Administered 2017-04-03: 5 mg via ORAL
  Filled 2017-04-03: qty 1

## 2017-04-03 MED ORDER — DEXTROSE 5 % IV SOLN
1.0000 g | INTRAVENOUS | Status: DC
  Administered 2017-04-03: 1 g via INTRAVENOUS
  Filled 2017-04-03: qty 10

## 2017-04-03 NOTE — Discharge Summary (Signed)
Physician Discharge Summary  Aveena Bari Pra EQA:[id number] DOB: 07-Jun-1926 DOA: 03/31/2017  PCP: Lujean Amel, MD  Admit date: 03/31/2017 Discharge date: 04/03/2017  Admitted From: Home Disposition:  SNF  Recommendations for Outpatient Follow-up:  1. Follow up with PCP in 1-2 weeks 2. Please obtain BMP/CBC in one week   Discharge Condition:Stable CODE STATUS: Full Diet recommendation: Heart Healthy  Brief/Interim Summary:  Patient is a 83 year old female with past medical history of hypertension, CLL, mild baseline dementia who presented to the emergency department after a fall at home with.  She was found on the floor.  Patient was admitted for the management of dehydration, acute kidney injury and elevated CK level. Patient was started on IV fluids.  Her overall condition gradually improved.  Urine cultures showed Proteus.  She has been started on IV antibiotic which has been changed to oral on discharge. Patient developed a physical therapy and recommended skilled nursing facility.  Following problems were addressed during the hospitalization:   Generalized weakness: Patient looks chronically debilitated.  Advanced  Age.  Weakness contributed to the fall.  Physical therapy evaluated the patient and recommended skilled nursing facility. Imagings did not show any fractures, dislocations Education officer, museum on board.  Patient will be discharged to skilled nursing facility.  Dehydration/AKI: AKI has resolved with IV fluids.  Hypertension:   On antihypertensives at home which will resume.Added amlodipine.  CLL: Was on lukeran and prednisone at home.  Reported that she takes Montserrat one week on and one week off.  We will resume these meds on DC. Patient follows with oncology as an outpatient.  Confusion: Patient looks pleasantly confused.  Mild dementia might be contributing.  Family noted that since she is confused once she gets urinary tract infection.  Her UA on presentation was  not suggestive of  UTI but  urine culture grew proteus.  Hypokalemia: Supplemented.  Leukocytosis: Patient has history of CLL which might be contributing to leukocytosis.  She was also on steroids.  Elevated CK: Improved   Discharge Diagnoses:  Principal Problem:   Generalized weakness Active Problems:   HTN (hypertension)   Debility   CLL (chronic lymphocytic leukemia) (HCC)   AKI (acute kidney injury) (HCC)   Dehydration   Elevated CK    Discharge Instructions  Discharge Instructions    Diet - low sodium heart healthy   Complete by:  As directed    Discharge instructions   Complete by:  As directed    1) Take the prescribed medications as instructed. 2) Follow up with your primary care physician in 1-2 weeks.   Increase activity slowly   Complete by:  As directed      Allergies as of 04/03/2017      Reactions   Rituximab Other (See Comments)   Upset stomach and blood pressure dropped      Medication List    TAKE these medications   acetaminophen 325 MG tablet Commonly known as:  TYLENOL Take 650 mg by mouth every 6 (six) hours as needed for mild pain.   amLODipine 5 MG tablet Commonly known as:  NORVASC Take 1 tablet (5 mg total) by mouth daily.   COMBIVENT RESPIMAT 20-100 MCG/ACT Aers respimat Generic drug:  Ipratropium-Albuterol Take 2 puffs by mouth every 6 (six) hours as needed for wheezing or shortness of breath.   isosorbide mononitrate 30 MG 24 hr tablet Commonly known as:  IMDUR Take 30 mg by mouth daily.   LEUKERAN 2 MG tablet Generic drug:  chlorambucil  TAKE 1 TABLET (2 MG TOTAL) BY MOUTH DAILY. GIVE ON AN EMPTY STOMACH 1 HOUR BEFORE OR 2 HOURS AFTER MEALS.   levofloxacin 500 MG tablet Commonly known as:  LEVAQUIN Take 1 tablet (500 mg total) by mouth daily. Start from tomorrow Start taking on:  04/04/2017   lidocaine-prilocaine cream Commonly known as:  EMLA Apply 1 application topically as needed. What changed:  reasons to take  this   lovastatin 40 MG tablet Commonly known as:  MEVACOR Take 40 mg by mouth at bedtime.   metoprolol tartrate 25 MG tablet Commonly known as:  LOPRESSOR Take 0.5 tablets (12.5 mg total) by mouth 2 (two) times daily.   montelukast 10 MG tablet Commonly known as:  SINGULAIR Take 10 mg by mouth at bedtime.   omeprazole 20 MG capsule Commonly known as:  PRILOSEC Take 1 capsule (20 mg total) by mouth daily.   ondansetron 8 MG tablet Commonly known as:  ZOFRAN Take 1 tablet (8 mg total) by mouth every 8 (eight) hours as needed for nausea.   potassium chloride 10 MEQ tablet Commonly known as:  K-DUR Take 10 mEq by mouth daily.   predniSONE 5 MG tablet Commonly known as:  DELTASONE Take 1 tablet (5 mg total) by mouth daily with breakfast.   promethazine 12.5 MG tablet Commonly known as:  PHENERGAN Take 1 tablet (12.5 mg total) by mouth every 6 (six) hours as needed for nausea.   QUEtiapine 25 MG tablet Commonly known as:  SEROQUEL Take 12.5 mg by mouth at bedtime.      Follow-up Information    Koirala, Dibas, MD. Schedule an appointment as soon as possible for a visit in 1 week(s).   Specialty:  Family Medicine Contact information: [address] [place] [address] [phone]          Allergies  Allergen Reactions  . Rituximab Other (See Comments)    Upset stomach and blood pressure dropped    Consultations:  None   Procedures/Studies: Ct Head Wo Contrast  Result Date: 03/31/2017 CLINICAL DATA:  Patient coming from [facility] with c/o fall. Pt was found on the kitchen floor. Pt denies being on blood thinner. Pt was confused went ems go to the house but she is on her baseline at this time. Pt very HOH. Pt c/o dizziness went standing. EXAM: CT HEAD WITHOUT CONTRAST CT CERVICAL SPINE WITHOUT CONTRAST TECHNIQUE: Multidetector CT imaging of the head and cervical spine was performed following the standard protocol without  intravenous contrast. Multiplanar CT image reconstructions of the cervical spine were also generated. COMPARISON:  05/08/2016 FINDINGS: CT HEAD FINDINGS Brain: No evidence of acute infarction, hemorrhage, hydrocephalus, extra-axial collection or mass lesion/mass effect. The ventricles and sulci are mildly enlarged reflecting age appropriate volume loss. Patchy white matter hypoattenuation is also noted consistent with moderate chronic microvascular ischemic change. Vascular: No hyperdense vessel or unexpected calcification. Skull: Normal. Negative for fracture or focal lesion. Sinuses/Orbits: Globes and orbits are unremarkable. Visualized sinuses and mastoid air cells are clear. Other: None. CT CERVICAL SPINE FINDINGS Alignment: Slight reversal the normal cervical lordosis, apex at C3-C4. No spondylolisthesis. Skull base and vertebrae: No acute fracture. No primary bone lesion or focal pathologic process. Soft tissues and spinal canal: No prevertebral fluid or swelling. No visible canal hematoma. Disc levels: There is marked loss of disc height at C3-C4 through C6-C7 with endplate sclerosis and spondylotic disc bulging with endplate spurring. Facet degenerative changes noted most prominently on the  left at C2-C3. There is no convincing disc herniation. Upper chest: There are numerous prominent bilateral neck lymph nodes, overall decreased in size is with compared to the prior CT. Lung apices are clear. Other: None. IMPRESSION: HEAD CT 1. No acute intracranial abnormalities. 2. Age appropriate volume loss and moderate chronic microvascular ischemic change. CERVICAL CT 1. No fracture or acute finding. Electronically Signed   By: Lajean Manes M.D.   On: 03/31/2017 14:35   Ct Cervical Spine Wo Contrast  Result Date: 03/31/2017 CLINICAL DATA:  Patient coming from [facility] with c/o fall. Pt was found on the kitchen floor. Pt denies being on blood thinner. Pt was confused went ems go to the house but  she is on her baseline at this time. Pt very HOH. Pt c/o dizziness went standing. EXAM: CT HEAD WITHOUT CONTRAST CT CERVICAL SPINE WITHOUT CONTRAST TECHNIQUE: Multidetector CT imaging of the head and cervical spine was performed following the standard protocol without intravenous contrast. Multiplanar CT image reconstructions of the cervical spine were also generated. COMPARISON:  05/08/2016 FINDINGS: CT HEAD FINDINGS Brain: No evidence of acute infarction, hemorrhage, hydrocephalus, extra-axial collection or mass lesion/mass effect. The ventricles and sulci are mildly enlarged reflecting age appropriate volume loss. Patchy white matter hypoattenuation is also noted consistent with moderate chronic microvascular ischemic change. Vascular: No hyperdense vessel or unexpected calcification. Skull: Normal. Negative for fracture or focal lesion. Sinuses/Orbits: Globes and orbits are unremarkable. Visualized sinuses and mastoid air cells are clear. Other: None. CT CERVICAL SPINE FINDINGS Alignment: Slight reversal the normal cervical lordosis, apex at C3-C4. No spondylolisthesis. Skull base and vertebrae: No acute fracture. No primary bone lesion or focal pathologic process. Soft tissues and spinal canal: No prevertebral fluid or swelling. No visible canal hematoma. Disc levels: There is marked loss of disc height at C3-C4 through C6-C7 with endplate sclerosis and spondylotic disc bulging with endplate spurring. Facet degenerative changes noted most prominently on the left at C2-C3. There is no convincing disc herniation. Upper chest: There are numerous prominent bilateral neck lymph nodes, overall decreased in size is with compared to the prior CT. Lung apices are clear. Other: None. IMPRESSION: HEAD CT 1. No acute intracranial abnormalities. 2. Age appropriate volume loss and moderate chronic microvascular ischemic change. CERVICAL CT 1. No fracture or acute finding. Electronically Signed   By: Lajean Manes M.D.   On:  03/31/2017 14:35   Dg Shoulder Left  Result Date: 03/31/2017 CLINICAL DATA:  Left shoulder pain due to a fall today. Initial encounter. EXAM: LEFT SHOULDER - 2+ VIEW COMPARISON:  None. FINDINGS: There is no acute bony or joint abnormality. Mild acromioclavicular degenerative change is noted. The glenohumeral joint appears normal. No focal bony lesion. IMPRESSION: No acute abnormality. Electronically Signed   By: Inge Rise M.D.   On: 03/31/2017 13:01   Dg Knee Complete 4 Views Right  Result Date: 03/31/2017 CLINICAL DATA:  Right knee pain due to a fall today. Initial encounter. EXAM: RIGHT KNEE - COMPLETE 4+ VIEW COMPARISON:  None. FINDINGS: There is no acute bony or joint abnormality. No joint effusion. Chondrocalcinosis is identified. Atherosclerotic vascular disease is present. IMPRESSION: No acute abnormality. Electronically Signed   By: Inge Rise M.D.   On: 03/31/2017 13:02   Dg Hip Unilat With Pelvis 2-3 Views Left  Result Date: 03/31/2017 CLINICAL DATA:  83 year old female with fall and left hip pain. EXAM: DG HIP (WITH OR WITHOUT PELVIS) 2-3V LEFT COMPARISON:  None. FINDINGS: There is no acute  fracture or dislocation. The bones are osteopenic. Mild bilateral hip arthritic changes. There is degenerative changes of the lower lumbar spine. The soft tissues are unremarkable. IMPRESSION: No fracture or dislocation. Electronically Signed   By: Anner Crete M.D.   On: 03/31/2017 22:53       Subjective: Patient seen and examined the bedside this morning.  Remains comfortable.  Denies any complaints.  Discharge Exam: Vitals:   04/02/17 2205 04/03/17 0600  BP: (!) 176/80 (!) (P) 170/76  Pulse: 85 (P) 80  Resp: 16 (P) 16  Temp: 99.7 F (37.6 C) (P) 99.5 F (37.5 C)  SpO2: 94% (P) 94%   Vitals:   04/02/17 0345 04/02/17 1300 04/02/17 2205 04/03/17 0600  BP: (!) 148/84 (!) 188/86 (!) 176/80 (!) (P) 170/76  Pulse: 87 82 85 (P) 80  Resp: 18 16 16  (P) 16  Temp: 98.3 F  (36.8 C) 99.4 F (37.4 C) 99.7 F (37.6 C) (P) 99.5 F (37.5 C)  TempSrc: Oral Oral Oral (P) Oral  SpO2: 97% 97% 94% (P) 94%  Weight:      Height:        General: Pt is alert, awake, not in acute distress Cardiovascular: RRR, S1/S2 +, no rubs, no gallops Respiratory: CTA bilaterally, no wheezing, no rhonchi Abdominal: Soft, NT, ND, bowel sounds + Extremities: no edema, no cyanosis    The results of significant diagnostics from this hospitalization (including imaging, microbiology, ancillary and laboratory) are listed below for reference.     Microbiology: Recent Results (from the past 240 hour(s))  Culture, Urine     Status: Abnormal (Preliminary result)   Collection Time: 04/01/17  5:09 PM  Result Value Ref Range Status   Specimen Description URINE, CLEAN CATCH  Final   Special Requests NONE  Final   Culture >=100,000 COLONIES/mL PROTEUS MIRABILIS (A)  Final   Report Status PENDING  Incomplete     Labs: BNP (last 3 results) No results for input(s): BNP in the last 8760 hours. Basic Metabolic Panel: Recent Labs  Lab 03/31/17 1305 04/01/17 0346 04/02/17 0334 04/03/17 0328  NA 142 139 138 139  K 3.8 3.3* 3.3* 3.7  CL 103 111 107 106  CO2 26 21* 25 27  GLUCOSE 110* 88 99 110*  BUN 44* 40* 29* 27*  CREATININE 1.34* 1.12* 0.96 0.97  CALCIUM 9.5 8.4* 8.3* 8.7*  MG  --   --  1.8  --    Liver Function Tests: Recent Labs  Lab 04/01/17 0346  AST 47*  ALT 23  ALKPHOS 53  BILITOT 0.9  PROT 5.9*  ALBUMIN 3.5   No results for input(s): LIPASE, AMYLASE in the last 168 hours. No results for input(s): AMMONIA in the last 168 hours. CBC: Recent Labs  Lab 03/31/17 1305 04/02/17 0334 04/03/17 0328  WBC 16.3* 22.2* 19.3*  NEUTROABS  --  1.6* 2.3  HGB 14.0 11.5* 11.1*  HCT 42.6 35.7* 34.8*  MCV 89.3 90.2 91.3  PLT 313 272 250   Cardiac Enzymes: Recent Labs  Lab 03/31/17 1810 04/02/17 0334  CKTOTAL 1,025* 420*   BNP: Invalid input(s):  POCBNP CBG: Recent Labs  Lab 03/31/17 1318  GLUCAP 108*   D-Dimer No results for input(s): DDIMER in the last 72 hours. Hgb A1c No results for input(s): HGBA1C in the last 72 hours. Lipid Profile No results for input(s): CHOL, HDL, LDLCALC, TRIG, CHOLHDL, LDLDIRECT in the last 72 hours. Thyroid function studies No results for input(s): TSH, T4TOTAL, T3FREE, THYROIDAB  in the last 72 hours.  Invalid input(s): FREET3 Anemia work up No results for input(s): VITAMINB12, FOLATE, FERRITIN, TIBC, IRON, RETICCTPCT in the last 72 hours. Urinalysis    Component Value Date/Time   COLORURINE YELLOW 03/31/2017 1612   APPEARANCEUR HAZY (A) 03/31/2017 1612   LABSPEC 1.016 03/31/2017 1612   LABSPEC 1.015 11/13/2016 0848   PHURINE 5.0 03/31/2017 1612   GLUCOSEU NEGATIVE 03/31/2017 1612   GLUCOSEU Negative 11/13/2016 0848   HGBUR SMALL (A) 03/31/2017 1612   BILIRUBINUR NEGATIVE 03/31/2017 1612   BILIRUBINUR Negative 11/13/2016 0848   KETONESUR 5 (A) 03/31/2017 1612   PROTEINUR 100 (A) 03/31/2017 1612   UROBILINOGEN 0.2 11/13/2016 0848   NITRITE NEGATIVE 03/31/2017 1612   LEUKOCYTESUR NEGATIVE 03/31/2017 1612   LEUKOCYTESUR Large 11/13/2016 0848   Sepsis Labs Invalid input(s): PROCALCITONIN,  WBC,  LACTICIDVEN Microbiology Recent Results (from the past 240 hour(s))  Culture, Urine     Status: Abnormal (Preliminary result)   Collection Time: 04/01/17  5:09 PM  Result Value Ref Range Status   Specimen Description URINE, CLEAN CATCH  Final   Special Requests NONE  Final   Culture >=100,000 COLONIES/mL PROTEUS MIRABILIS (A)  Final   Report Status PENDING  Incomplete     Time coordinating discharge: Over 30 minutes  SIGNED:   Marene Lenz, MD  [facility] 04/03/2017, 3:16 PM Pager [phone]  If 7PM-7AM, please contact night-coverage [email or web address] Password [email or web address]

## 2017-04-03 NOTE — Clinical Social Work Placement (Addendum)
Patient daughter will transport.    CLINICAL SOCIAL WORK PLACEMENT  NOTE  Date:  04/03/2017  Patient Details  Name: Carla Conway MRN: [id number] Date of Birth: Apr 07, 1927  Clinical Social Work is seeking post-discharge placement for this patient at the [place] level of care (*CSW will initial, date and re-position this form in  chart as items are completed):  Yes   Patient/family provided with [facility] Work Department's list of facilities offering this level of care within the geographic area requested by the patient (or if unable, by the patient's family).  Yes   Patient/family informed of their freedom to choose among providers that offer the needed level of care, that participate in Medicare, Medicaid or managed care program needed by the patient, have an available bed and are willing to accept the patient.  Yes   Patient/family informed of [facility]'s ownership interest in [facility] and [facility], as well as of the fact that they are under no obligation to receive care at these facilities.  PASRR submitted to EDS on 04/03/17     PASRR number received on 04/03/17     Existing PASRR number confirmed on       FL2 transmitted to all facilities in geographic area requested by pt/family on       FL2 transmitted to all facilities within larger geographic area on 04/03/17     Patient informed that his/her managed care company has contracts with or will negotiate with certain facilities, including the following:            Patient/family informed of bed offers received.  Patient chooses bed at     [facility]   Physician recommends and patient chooses bed at      Patient to be transferred to   on  . 12/28  Patient to be transferred to facility by     Daughter will transport.   Patient family notified on   of transfer. Daughter  12/28  Name of family member notified:      Daughter   PHYSICIAN       Additional  Comment:    _______________________________________________ Lia Hopping, LCSW 04/03/2017, 2:56 PM

## 2017-04-03 NOTE — NC FL2 (Signed)
[place] LEVEL OF CARE SCREENING TOOL     IDENTIFICATION  Patient Name: Carla Conway [facility] Birthdate: [birth year] Sex: female Admission Date (Current Location): 03/31/2017  [facility] and [place] Number:  Herbalist and Address:  [facility],  [place] [address], [place]      Provider Number: [id number]  Attending Physician Name and Address:  Marene Lenz, MD  Relative Name and Phone Number:       Current Level of Care: Hospital Recommended Level of Care: [place] Prior Approval Number:    Date Approved/Denied:   PASRR Number:    [id number]  Discharge Plan: SNF    Current Diagnoses: Patient Active Problem List   Diagnosis Date Noted  . Elevated CK 04/01/2017  . AKI (acute kidney injury) ([place]) 03/31/2017  . Dehydration 03/31/2017  . Generalized weakness 03/31/2017  . External hemorrhoids without complication 29/52/8413  . Port-A-Cath in place 01/06/2017  . Gastritis 01/06/2017  . Poor memory 12/11/2016  . Dysuria 11/13/2016  . Anaphylaxis 10/29/2016  . Anaphylactoid reaction 10/29/2016  . Goals of care, counseling/discussion 10/28/2016  . Chronic kidney disease, stage III (moderate) ([place]) 10/17/2016  . 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 Neutropenia ([place]) 09/23/2016  . CLL (chronic lymphocytic leukemia) ([place]) 05/11/2016  . Acute on chronic diastolic heart failure ([place]) 05/10/2016  . Influenza A 05/09/2016  . Lymphadenopathy of head and neck region 05/09/2016  . Lymphadenopathy, thoracic 05/09/2016  . COPD (chronic obstructive pulmonary disease) ([place]) 05/09/2016  . Debility 05/09/2016  . Syncopal episodes 05/09/2016  . Syncope 05/08/2016  . HTN (hypertension) 05/18/2013  . CAD in native artery 05/18/2013    Orientation RESPIRATION BLADDER Height & Weight     Self, Situation, Time, Place  Normal Continent Weight: 159 lb (72.1 kg) Height:  5' 6"  (167.6 cm)  BEHAVIORAL SYMPTOMS/MOOD  NEUROLOGICAL BOWEL NUTRITION STATUS      Continent Diet(Heart Healthy )  AMBULATORY STATUS COMMUNICATION OF NEEDS Skin   Extensive Assist Verbally Normal                       Personal Care Assistance Level of Assistance  Bathing, Feeding, Dressing Bathing Assistance: Limited assistance Feeding assistance: Independent Dressing Assistance: Limited assistance     Functional Limitations Info  Sight, Hearing, Speech Sight Info: Impaired Hearing Info: Adequate Speech Info: Adequate    SPECIAL CARE FACTORS FREQUENCY  PT (By licensed PT), OT (By licensed OT)     PT Frequency: 5x/week OT Frequency: 5x/week             Contractures Contractures Info: Not present    Additional Factors Info  Code Status, Allergies, Psychotropic Code Status Info: Fullcode  Allergies Info: Allergies: Rituximab Psychotropic Info: Seroquel          Current Medications (04/03/2017):  This is the current hospital active medication list Current Facility-Administered Medications  Medication Dose Route Frequency Provider Last Rate Last Dose  . acetaminophen (TYLENOL) tablet 650 mg  650 mg Oral Q6H PRN Etta Quill, DO       Or  . acetaminophen (TYLENOL) suppository 650 mg  650 mg Rectal Q6H PRN Etta Quill, DO      . cefTRIAXone (ROCEPHIN) 1 g in dextrose 5 % 50 mL IVPB  1 g Intravenous Q24H Marene Lenz, MD   Stopped at 04/03/17 1146  . enoxaparin (LOVENOX) injection 40 mg  40 mg Subcutaneous QHS Thomes Lolling, [facility]  40 mg at 04/02/17 2136  . ipratropium-albuterol (DUONEB) 0.5-2.5 (3) MG/3ML nebulizer solution 3 mL  3 mL Inhalation Q6H PRN Etta Quill, DO      . isosorbide mononitrate (IMDUR) 24 hr tablet 30 mg  30 mg Oral Daily Jennette Kettle M, DO   30 mg at 04/03/17 1117  . labetalol (NORMODYNE,TRANDATE) injection 5-10 mg  5-10 mg Intravenous Q2H PRN Etta Quill, DO      . lidocaine-prilocaine (EMLA) cream 1 application  1 application Topical PRN Etta Quill, DO      . metoprolol tartrate (LOPRESSOR) tablet 12.5 mg  12.5 mg Oral BID Jennette Kettle M, DO   12.5 mg at 04/03/17 1116  . montelukast (SINGULAIR) tablet 10 mg  10 mg Oral QHS Jennette Kettle M, DO   10 mg at 04/02/17 2137  . ondansetron (ZOFRAN) tablet 4 mg  4 mg Oral Q6H PRN Etta Quill, DO       Or  . ondansetron [facility]) injection 4 mg  4 mg Intravenous Q6H PRN Etta Quill, DO      . pantoprazole (PROTONIX) EC tablet 40 mg  40 mg Oral Daily Jennette Kettle M, DO   40 mg at 04/03/17 1117  . pravastatin (PRAVACHOL) tablet 20 mg  20 mg Oral q1800 Etta Quill, DO   20 mg at 04/02/17 1812  . predniSONE (DELTASONE) tablet 5 mg  5 mg Oral Q breakfast Etta Quill, DO   5 mg at 04/03/17 1116  . promethazine (PHENERGAN) tablet 12.5 mg  12.5 mg Oral Q6H PRN Etta Quill, DO      . QUEtiapine (SEROQUEL) tablet 12.5 mg  12.5 mg Oral QHS Jennette Kettle M, DO   12.5 mg at 04/02/17 2137     Discharge Medications: Please see discharge summary for a list of discharge medications.  Relevant Imaging Results:  Relevant Lab Results:   Additional Information ssn:[phone]  Lia Hopping, LCSW

## 2017-04-03 NOTE — Progress Notes (Signed)
Report called to [name]. Report given to [facility]. Discharge instructions reviewed with patient and daughter and both verbalized understanding.

## 2017-04-03 NOTE — Clinical Social Work Note (Signed)
Clinical Social Work Assessment  Patient Details  Name: Carla Conway MRN: [id number] Date of Birth: 1926-10-31  Date of referral:  04/03/17               Reason for consult:  Facility Placement                Permission sought to share information with:    Permission granted to share information::     Name::        Agency::     Relationship::  Daughter   Contact Information:     Housing/Transportation Living arrangements for the past 2 months:  Apartment Source of Information:  Adult Children, Patient Patient Interpreter Needed:  None Criminal Activity/Legal Involvement Pertinent to Current Situation/Hospitalization:  No - Comment as needed Significant Relationships:  Warehouse manager Lives with:  Self Do you feel safe going back to the place where you live?  Yes Need for family participation in patient care:  Yes (Comment)  Care giving concerns:  Patient admitted for fall at home. Patient was down in her home for 24 hours. Patient reports shoulder, neck and right calf pain.  PT recommends short rehab at [facility] for strengthening.    Social Worker assessment / plan:  CSW called daughter and spoke to patient bedside about discharge to SNF. Patient and daughter booth agreeable to SNF in the [place] area. Patient daughter requested a facility close to their home on [address].  CSW provided daughter with list of facilities willing to make offer. Patient daughter agreeable to [name]. FL2 Done, PASRR done.   Plan: D/C to SNF.   Employment status:  Retired Forensic scientist:  Medicare PT Recommendations:  [place] / Referral to community resources:  [place]  Patient/Family's Response to care:  Agreeable and Responding well to care.   Patient/Family's Understanding of and Emotional Response to Diagnosis, Current Treatment, and Prognosis: Patient has mild dementia. She states, " I go and do rehab but then I want to go home."  Patient daughter very knowledgeable of patient medical history and current diagnosis.  Emotional Assessment Appearance:  Developmentally appropriate Attitude/Demeanor/Rapport:    Affect (typically observed):  Accepting Orientation:  Oriented to Self, Oriented to Place, Oriented to  Time, Oriented to Situation Alcohol / Substance use:  Not Applicable Psych involvement (Current and /or in the community):  No (Comment)  Discharge Needs  Concerns to be addressed:  Discharge Planning Concerns Readmission within the last 30 days:  No Current discharge risk:  None Barriers to Discharge:  Continued Medical Work up   [employer], LCSW 04/03/2017, 3:02 PM

## 2017-04-03 NOTE — Progress Notes (Signed)
Attempted to call [name] to give report with no answer. Will attempt call again.

## 2017-04-04 LAB — URINE CULTURE

## 2017-04-08 MED FILL — LEUKERAN 2 MG TABLET: 2 | 25 days supply | Qty: 25 | Fill #1

## 2017-04-10 ENCOUNTER — Telehealth: Payer: Self-pay | Admitting: Hematology and Oncology

## 2017-04-10 NOTE — Telephone Encounter (Signed)
Patient called in to reschedule mother just got out of hospital

## 2017-04-14 ENCOUNTER — Other Ambulatory Visit: Payer: Medicare Other

## 2017-04-14 ENCOUNTER — Ambulatory Visit: Payer: Medicare Other | Admitting: Hematology and Oncology

## 2017-04-28 ENCOUNTER — Inpatient Hospital Stay: Payer: Medicare Other | Attending: Hematology and Oncology

## 2017-04-28 ENCOUNTER — Inpatient Hospital Stay (HOSPITAL_BASED_OUTPATIENT_CLINIC_OR_DEPARTMENT_OTHER): Payer: Medicare Other | Admitting: Hematology and Oncology

## 2017-04-28 ENCOUNTER — Inpatient Hospital Stay: Payer: Medicare Other

## 2017-04-28 ENCOUNTER — Encounter: Payer: Self-pay | Admitting: Hematology and Oncology

## 2017-04-28 ENCOUNTER — Telehealth: Payer: Self-pay | Admitting: Hematology and Oncology

## 2017-04-28 VITALS — BP 163/70 | HR 86 | Temp 97.9°F | Resp 18 | Ht 66.0 in | Wt 147.8 lb

## 2017-04-28 DIAGNOSIS — Z79899 Other long term (current) drug therapy: Secondary | ICD-10-CM | POA: Insufficient documentation

## 2017-04-28 DIAGNOSIS — R109 Unspecified abdominal pain: Secondary | ICD-10-CM

## 2017-04-28 DIAGNOSIS — Z9221 Personal history of antineoplastic chemotherapy: Secondary | ICD-10-CM | POA: Diagnosis not present

## 2017-04-28 DIAGNOSIS — C8300 Small cell B-cell lymphoma, unspecified site: Secondary | ICD-10-CM

## 2017-04-28 DIAGNOSIS — C9110 Chronic lymphocytic leukemia of B-cell type not having achieved remission: Secondary | ICD-10-CM

## 2017-04-28 DIAGNOSIS — R1033 Periumbilical pain: Secondary | ICD-10-CM

## 2017-04-28 DIAGNOSIS — Z95828 Presence of other vascular implants and grafts: Secondary | ICD-10-CM

## 2017-04-28 DIAGNOSIS — R531 Weakness: Secondary | ICD-10-CM | POA: Diagnosis not present

## 2017-04-28 LAB — COMPREHENSIVE METABOLIC PANEL
ALK PHOS: 69 U/L (ref 40–150)
ALT: 12 U/L (ref 0–55)
ANION GAP: 7 (ref 3–11)
AST: 16 U/L (ref 5–34)
Albumin: 3.8 g/dL (ref 3.5–5.0)
BILIRUBIN TOTAL: 0.4 mg/dL (ref 0.2–1.2)
BUN: 15 mg/dL (ref 7–26)
CALCIUM: 9.3 mg/dL (ref 8.4–10.4)
CO2: 29 mmol/L (ref 22–29)
CREATININE: 0.98 mg/dL (ref 0.60–1.10)
Chloride: 98 mmol/L (ref 98–109)
GFR calc non Af Amer: 50 mL/min — ABNORMAL LOW (ref >60)
GFR, EST AFRICAN AMERICAN: 58 mL/min — AB (ref >60)
Glucose, Bld: 87 mg/dL (ref 70–140)
Potassium: 4.2 mmol/L (ref 3.3–4.7)
Sodium: 134 mmol/L — ABNORMAL LOW (ref 136–145)
TOTAL PROTEIN: 6.2 g/dL — AB (ref 6.4–8.3)

## 2017-04-28 LAB — CBC WITH DIFFERENTIAL/PLATELET
BASOS PCT: 0 %
BLASTS: 0 %
Band Neutrophils: 0 %
Basophils Absolute: 0 10*3/uL (ref 0.0–0.1)
Eosinophils Absolute: 0.3 10*3/uL (ref 0.0–0.5)
Eosinophils Relative: 2 %
HEMATOCRIT: 36.1 % (ref 34.8–46.6)
HEMOGLOBIN: 11.9 g/dL (ref 11.6–15.9)
LYMPHS PCT: 76 %
Lymphs Abs: 9.4 10*3/uL — ABNORMAL HIGH (ref 0.9–3.3)
MCH: 29.5 pg (ref 25.1–34.0)
MCHC: 33 g/dL (ref 31.5–36.0)
MCV: 89.6 fL (ref 79.5–101.0)
Metamyelocytes Relative: 0 %
Monocytes Absolute: 1.9 10*3/uL — ABNORMAL HIGH (ref 0.1–0.9)
Monocytes Relative: 15 %
Myelocytes: 0 %
NEUTROS PCT: 7 %
NRBC: 0 /100{WBCs}
Neutro Abs: 0.9 10*3/uL — ABNORMAL LOW (ref 1.5–6.5)
Other: 0 %
PROMYELOCYTES ABS: 0 %
Platelets: 156 10*3/uL (ref 145–400)
RBC: 4.03 MIL/uL (ref 3.70–5.45)
RDW: 15.6 % (ref 11.2–16.1)
WBC: 12.5 10*3/uL — ABNORMAL HIGH (ref 3.9–10.3)

## 2017-04-28 MED ORDER — HEPARIN SOD (PORK) LOCK FLUSH 100 UNIT/ML IV SOLN
500.0000 [IU] | Freq: Once | INTRAVENOUS | Status: AC
  Administered 2017-04-28: 500 [IU]
  Filled 2017-04-28: qty 5

## 2017-04-28 MED ORDER — SODIUM CHLORIDE 0.9% FLUSH
10.0000 mL | Freq: Once | INTRAVENOUS | Status: AC
  Administered 2017-04-28: 10 mL
  Filled 2017-04-28: qty 10

## 2017-04-28 NOTE — Telephone Encounter (Signed)
Gave patient AVS and calendar of upcoming January appointments

## 2017-04-28 NOTE — Progress Notes (Signed)
Carla Conway OFFICE PROGRESS NOTE  Patient Care Team: Koirala, Dibas, MD as PCP - General (Family Medicine)  SUMMARY OF ONCOLOGIC HISTORY:   CLL (chronic lymphocytic leukemia) ([place])   05/02/2013 Initial Diagnosis    She was found to have abnormal CBC from at least since 2015. According to the records from her primary care doctor that were faxed to the [facility], she had leukocytosis at least since January 2015. On 05/02/2013, total white blood cell count was 28.3, normal hemoglobin 13.4 and platelet count 239,000. She had predominantly Lymphocytosis. On 06/20/2013, a CBC show white count of 26.7, hemoglobin 12.9 and platelet count of 299,000 On admission, on 05/08/2016, white blood cell count was 38.4, hemoglobin 12.1 and platelet count of 210. Differential show predominantly lymphocytosis.      05/08/2016 Imaging    CT scan of chest 1. Negative for acute pulmonary embolism 2. Numerous pathologic nodes in the mediastinum, hila, axillae, supraclavicular regions and upper abdomen. The adenopathy is suspicious for malignancy such as lymphoma or CLL. If tissue diagnosis is desired, many of the supraclavicular and axillary nodes should be palpable and accessible to excisional biopsy. 3. Low-attenuation liver lesions, incompletely imaged and not characterized but more likely benign. 4. Small hiatal hernia.      09/22/2016 Pathology Results    Peripheral Blood Flow Cytometry - CHRONIC LYMPHOCYTIC LEUKEMIA. - SEE COMMENT. Diagnosis Comment: There is a population of monoclonal B-cells with expression of CD5 and CD23. The phenotype is consistent with chronic lymphocytic leukemia      09/22/2016 Pathology Results    FISH confirmed trisomy 12      10/21/2016 PET scan    1. Extensive adenopathy in the neck, chest, abdomen, and pelvis as detailed above, primarily Deauville 3 and Deauville 4. Mild splenomegaly. 2. High activity in some soft tissue prominence along the anal region,  possibly physiologic or due to hemorrhoidal activity or urinary incontinence, but correlation with anorectal exam recommended. 3. Focal abnormal accentuated high activity in the sigmoid colon along a region of abnormal stranding and diverticulosis, suspicious for active diverticulitis. Correlation with the patient's colon cancer screening history is recommended. If screening is not up-to-date, appropriate screening should be considered. 4. Aortic Atherosclerosis (ICD10-I70.0). Coronary atherosclerosis. 5. An exophytic high density lesion from the right kidney upper pole does not appear to be hypermetabolic and accordingly is most likely to be a benign complex cyst, but this lesion is not fully characterized on today' s exam. If the patient has hematuria or if otherwise clinically warranted, renal protocol MRI or CT examination could be utilized. 6. Some of the smaller hepatic lesions are nonspecific, but the larger hepatic lesions are photopenic and likely cysts.      10/23/2016 Procedure    Placement of single lumen port a cath via right internal jugular vein. The catheter tip lies at the cavo-atrial junction. A power injectable port a cath was placed and is ready for immediate use      10/29/2016 - 10/30/2016 Hospital Admission    She was admitted after allergic reaction to Rituximab      10/29/2016 Adverse Reaction    She received Rituximab complicated by allergic reaction. Treatment was abandoned      11/20/2016 -  Chemotherapy    The patient is started on chlorambucil and low dose prednisone       INTERVAL HISTORY: Please see below for problem oriented charting. She returns with her daughter for further follow-up She had a recent fall around Christmas  time and was hospitalized The patient had poor appetite, and persistent chronic periumbilical pain.  She has lost some weight since the last time I saw her She was diagnosed with recent urinary tract infection She denies diarrhea or  constipation No recent nausea or vomiting.  REVIEW OF SYSTEMS:   Constitutional: Denies fevers, chills  Eyes: Denies blurriness of vision Ears, nose, mouth, throat, and face: Denies mucositis or sore throat Respiratory: Denies cough, dyspnea or wheezes Cardiovascular: Denies palpitation, chest discomfort or lower extremity swelling Gastrointestinal:  Denies nausea, heartburn or change in bowel habits Skin: Denies abnormal skin rashes Lymphatics: Denies new lymphadenopathy or easy bruising Neurological:Denies numbness, tingling or new weaknesses Behavioral/Psych: Mood is stable, no new changes  All other systems were reviewed with the patient and are negative.  I have reviewed the past medical history, past surgical history, social history and family history with the patient and they are unchanged from previous note.  ALLERGIES:  is allergic to rituximab.  MEDICATIONS:  Current Outpatient Medications  Medication Sig Dispense Refill  . acetaminophen (TYLENOL) 325 MG tablet Take 650 mg by mouth every 6 (six) hours as needed for mild pain.    Marland Kitchen amLODipine (NORVASC) 5 MG tablet Take 1 tablet (5 mg total) by mouth daily. 30 tablet 0  . COMBIVENT RESPIMAT 20-100 MCG/ACT AERS respimat Take 2 puffs by mouth every 6 (six) hours as needed for wheezing or shortness of breath.     . isosorbide mononitrate (IMDUR) 30 MG 24 hr tablet Take 30 mg by mouth daily.     Marland Kitchen LEUKERAN 2 MG tablet TAKE 1 TABLET (2 MG TOTAL) BY MOUTH DAILY. GIVE ON AN EMPTY STOMACH 1 HOUR BEFORE OR 2 HOURS AFTER MEALS. 25 tablet 1  . lidocaine-prilocaine (EMLA) cream Apply 1 application topically as needed. (Patient taking differently: Apply 1 application topically as needed (For port-a-cath.). ) 30 g 6  . lovastatin (MEVACOR) 40 MG tablet Take 40 mg by mouth at bedtime.     . metoprolol tartrate (LOPRESSOR) 25 MG tablet Take 0.5 tablets (12.5 mg total) by mouth 2 (two) times daily. 60 tablet 0  . montelukast (SINGULAIR) 10 MG  tablet Take 10 mg by mouth at bedtime.     Marland Kitchen omeprazole (PRILOSEC) 20 MG capsule Take 1 capsule (20 mg total) by mouth daily. 30 capsule 11  . ondansetron (ZOFRAN) 8 MG tablet Take 1 tablet (8 mg total) by mouth every 8 (eight) hours as needed for nausea. 30 tablet 3  . potassium chloride (K-DUR) 10 MEQ tablet Take 10 mEq by mouth daily.    . predniSONE (DELTASONE) 5 MG tablet Take 1 tablet (5 mg total) by mouth daily with breakfast. 60 tablet 1  . promethazine (PHENERGAN) 12.5 MG tablet Take 1 tablet (12.5 mg total) by mouth every 6 (six) hours as needed for nausea. 30 tablet 3  . QUEtiapine (SEROQUEL) 25 MG tablet Take 12.5 mg by mouth at bedtime.      No current facility-administered medications for this visit.     PHYSICAL EXAMINATION: ECOG PERFORMANCE STATUS: 2 - Symptomatic, <50% confined to bed  Vitals:   04/28/17 1336  BP: (!) 163/70  Pulse: 86  Resp: 18  Temp: 97.9 F (36.6 C)  SpO2: 96%   Filed Weights   04/28/17 1336  Weight: 147 lb 12.8 oz (67 kg)    GENERAL:alert, no distress and comfortable SKIN: skin color, texture, turgor are normal, no rashes or significant lesions EYES: normal, Conjunctiva are pink and  non-injected, sclera clear OROPHARYNX:no exudate, no erythema and lips, buccal mucosa, and tongue normal  NECK: supple, thyroid normal size, non-tender, without nodularity LYMPH:  no palpable lymphadenopathy in the cervical, axillary or inguinal LUNGS: clear to auscultation and percussion with normal breathing effort HEART: regular rate & rhythm and no murmurs and no lower extremity edema ABDOMEN:abdomen soft, mild tenderness on exam on the periumbilical region without rebound or guarding Musculoskeletal:no cyanosis of digits and no clubbing  NEURO: alert & oriented x 3 with fluent speech, no focal motor/sensory deficits  LABORATORY DATA:  I have reviewed the data as listed    Component Value Date/Time   NA 134 (L) 04/28/2017 1238   NA 138 02/17/2017 0810    K 4.2 04/28/2017 1238   K 3.7 02/17/2017 0810   CL 98 04/28/2017 1238   CO2 29 04/28/2017 1238   CO2 27 02/17/2017 0810   GLUCOSE 87 04/28/2017 1238   GLUCOSE 92 02/17/2017 0810   BUN 15 04/28/2017 1238   BUN 16.4 02/17/2017 0810   CREATININE 0.98 04/28/2017 1238   CREATININE 1.1 02/17/2017 0810   CALCIUM 9.3 04/28/2017 1238   CALCIUM 9.3 02/17/2017 0810   PROT 6.2 (L) 04/28/2017 1238   PROT 6.4 02/17/2017 0810   ALBUMIN 3.8 04/28/2017 1238   ALBUMIN 3.9 02/17/2017 0810   AST 16 04/28/2017 1238   AST 13 02/17/2017 0810   ALT 12 04/28/2017 1238   ALT 13 02/17/2017 0810   ALKPHOS 69 04/28/2017 1238   ALKPHOS 74 02/17/2017 0810   BILITOT 0.4 04/28/2017 1238   BILITOT 0.60 02/17/2017 0810   GFRNONAA 50 (L) 04/28/2017 1238   GFRAA 58 (L) 04/28/2017 1238    No results found for: SPEP, UPEP  Lab Results  Component Value Date   WBC 12.5 (H) 04/28/2017   NEUTROABS 0.9 (L) 04/28/2017   HGB 11.9 04/28/2017   HCT 36.1 04/28/2017   MCV 89.6 04/28/2017   PLT 156 04/28/2017      Chemistry      Component Value Date/Time   NA 134 (L) 04/28/2017 1238   NA 138 02/17/2017 0810   K 4.2 04/28/2017 1238   K 3.7 02/17/2017 0810   CL 98 04/28/2017 1238   CO2 29 04/28/2017 1238   CO2 27 02/17/2017 0810   BUN 15 04/28/2017 1238   BUN 16.4 02/17/2017 0810   CREATININE 0.98 04/28/2017 1238   CREATININE 1.1 02/17/2017 0810      Component Value Date/Time   CALCIUM 9.3 04/28/2017 1238   CALCIUM 9.3 02/17/2017 0810   ALKPHOS 69 04/28/2017 1238   ALKPHOS 74 02/17/2017 0810   AST 16 04/28/2017 1238   AST 13 02/17/2017 0810   ALT 12 04/28/2017 1238   ALT 13 02/17/2017 0810   BILITOT 0.4 04/28/2017 1238   BILITOT 0.60 02/17/2017 0810       RADIOGRAPHIC STUDIES: I have personally reviewed the radiological images as listed and agreed with the findings in the report. Ct Head Wo Contrast  Result Date: 03/31/2017 CLINICAL DATA:  Patient coming from [facility] with c/o  fall. Pt was found on the kitchen floor. Pt denies being on blood thinner. Pt was confused went ems go to the house but she is on her baseline at this time. Pt very HOH. Pt c/o dizziness went standing. EXAM: CT HEAD WITHOUT CONTRAST CT CERVICAL SPINE WITHOUT CONTRAST TECHNIQUE: Multidetector CT imaging of the head and cervical spine was performed following the standard protocol without intravenous contrast. Multiplanar  CT image reconstructions of the cervical spine were also generated. COMPARISON:  05/08/2016 FINDINGS: CT HEAD FINDINGS Brain: No evidence of acute infarction, hemorrhage, hydrocephalus, extra-axial collection or mass lesion/mass effect. The ventricles and sulci are mildly enlarged reflecting age appropriate volume loss. Patchy white matter hypoattenuation is also noted consistent with moderate chronic microvascular ischemic change. Vascular: No hyperdense vessel or unexpected calcification. Skull: Normal. Negative for fracture or focal lesion. Sinuses/Orbits: Globes and orbits are unremarkable. Visualized sinuses and mastoid air cells are clear. Other: None. CT CERVICAL SPINE FINDINGS Alignment: Slight reversal the normal cervical lordosis, apex at C3-C4. No spondylolisthesis. Skull base and vertebrae: No acute fracture. No primary bone lesion or focal pathologic process. Soft tissues and spinal canal: No prevertebral fluid or swelling. No visible canal hematoma. Disc levels: There is marked loss of disc height at C3-C4 through C6-C7 with endplate sclerosis and spondylotic disc bulging with endplate spurring. Facet degenerative changes noted most prominently on the left at C2-C3. There is no convincing disc herniation. Upper chest: There are numerous prominent bilateral neck lymph nodes, overall decreased in size is with compared to the prior CT. Lung apices are clear. Other: None. IMPRESSION: HEAD CT 1. No acute intracranial abnormalities. 2. Age appropriate volume loss and moderate chronic  microvascular ischemic change. CERVICAL CT 1. No fracture or acute finding. Electronically Signed   By: Lajean Manes M.D.   On: 03/31/2017 14:35   Ct Cervical Spine Wo Contrast  Result Date: 03/31/2017 CLINICAL DATA:  Patient coming from [facility] with c/o fall. Pt was found on the kitchen floor. Pt denies being on blood thinner. Pt was confused went ems go to the house but she is on her baseline at this time. Pt very HOH. Pt c/o dizziness went standing. EXAM: CT HEAD WITHOUT CONTRAST CT CERVICAL SPINE WITHOUT CONTRAST TECHNIQUE: Multidetector CT imaging of the head and cervical spine was performed following the standard protocol without intravenous contrast. Multiplanar CT image reconstructions of the cervical spine were also generated. COMPARISON:  05/08/2016 FINDINGS: CT HEAD FINDINGS Brain: No evidence of acute infarction, hemorrhage, hydrocephalus, extra-axial collection or mass lesion/mass effect. The ventricles and sulci are mildly enlarged reflecting age appropriate volume loss. Patchy white matter hypoattenuation is also noted consistent with moderate chronic microvascular ischemic change. Vascular: No hyperdense vessel or unexpected calcification. Skull: Normal. Negative for fracture or focal lesion. Sinuses/Orbits: Globes and orbits are unremarkable. Visualized sinuses and mastoid air cells are clear. Other: None. CT CERVICAL SPINE FINDINGS Alignment: Slight reversal the normal cervical lordosis, apex at C3-C4. No spondylolisthesis. Skull base and vertebrae: No acute fracture. No primary bone lesion or focal pathologic process. Soft tissues and spinal canal: No prevertebral fluid or swelling. No visible canal hematoma. Disc levels: There is marked loss of disc height at C3-C4 through C6-C7 with endplate sclerosis and spondylotic disc bulging with endplate spurring. Facet degenerative changes noted most prominently on the left at C2-C3. There is no convincing disc herniation. Upper  chest: There are numerous prominent bilateral neck lymph nodes, overall decreased in size is with compared to the prior CT. Lung apices are clear. Other: None. IMPRESSION: HEAD CT 1. No acute intracranial abnormalities. 2. Age appropriate volume loss and moderate chronic microvascular ischemic change. CERVICAL CT 1. No fracture or acute finding. Electronically Signed   By: Lajean Manes M.D.   On: 03/31/2017 14:35   Dg Shoulder Left  Result Date: 03/31/2017 CLINICAL DATA:  Left shoulder pain due to a fall today. Initial encounter. EXAM: LEFT  SHOULDER - 2+ VIEW COMPARISON:  None. FINDINGS: There is no acute bony or joint abnormality. Mild acromioclavicular degenerative change is noted. The glenohumeral joint appears normal. No focal bony lesion. IMPRESSION: No acute abnormality. Electronically Signed   By: Inge Rise M.D.   On: 03/31/2017 13:01   Dg Knee Complete 4 Views Right  Result Date: 03/31/2017 CLINICAL DATA:  Right knee pain due to a fall today. Initial encounter. EXAM: RIGHT KNEE - COMPLETE 4+ VIEW COMPARISON:  None. FINDINGS: There is no acute bony or joint abnormality. No joint effusion. Chondrocalcinosis is identified. Atherosclerotic vascular disease is present. IMPRESSION: No acute abnormality. Electronically Signed   By: Inge Rise M.D.   On: 03/31/2017 13:02   Dg Hip Unilat With Pelvis 2-3 Views Left  Result Date: 03/31/2017 CLINICAL DATA:  84 year old female with fall and left hip pain. EXAM: DG HIP (WITH OR WITHOUT PELVIS) 2-3V LEFT COMPARISON:  None. FINDINGS: There is no acute fracture or dislocation. The bones are osteopenic. Mild bilateral hip arthritic changes. There is degenerative changes of the lower lumbar spine. The soft tissues are unremarkable. IMPRESSION: No fracture or dislocation. Electronically Signed   By: Anner Crete M.D.   On: 03/31/2017 22:53    ASSESSMENT & PLAN:  CLL (chronic lymphocytic leukemia) ([place]) The patient is very frail She was  recently hospitalized after a fall The patient has severe intermittent abdominal pain of unknown etiology The pattern of pain and discomfort is not consistent with gastritis It could be due to residual disease Clinically, lymphocytosis has improved dramatically I recommend repeat imaging study with PET CT scan to assess response to treatment If she has minimum residual disease, I would discontinue all chemotherapy and observe only  Abdominal pain She has chronic intermittent periumbilical pain that could be related to residual disease I plan to repeat imaging study for further assessment I recommend discontinuation of prednisone and Leukeran and potassium supplement for now   Orders Placed This Encounter  Procedures  . NM PET Image Restag (PS) Skull Base To Thigh    Standing Status:   Future    Standing Expiration Date:   04/28/2018    Order Specific Question:   If indicated for the ordered procedure, I authorize the administration of a radiopharmaceutical per Radiology protocol    Answer:   Yes    Order Specific Question:   Preferred imaging location?    Answer:   [facility]    Order Specific Question:   Radiology Contrast Protocol - do NOT remove file path    Answer:   [email or web address]   All questions were answered. The patient knows to call the clinic with any problems, questions or concerns. No barriers to learning was detected. I spent 25 minutes counseling the patient face to face. The total time spent in the appointment was 30 minutes and more than 50% was on counseling and review of test results     Heath Lark, MD 04/28/2017 4:22 PM

## 2017-04-28 NOTE — Assessment & Plan Note (Signed)
The patient is very frail She was recently hospitalized after a fall The patient has severe intermittent abdominal pain of unknown etiology The pattern of pain and discomfort is not consistent with gastritis It could be due to residual disease Clinically, lymphocytosis has improved dramatically I recommend repeat imaging study with PET CT scan to assess response to treatment If she has minimum residual disease, I would discontinue all chemotherapy and observe only

## 2017-04-28 NOTE — Assessment & Plan Note (Signed)
She has chronic intermittent periumbilical pain that could be related to residual disease I plan to repeat imaging study for further assessment I recommend discontinuation of prednisone and Leukeran and potassium supplement for now

## 2017-04-29 ENCOUNTER — Emergency Department (HOSPITAL_COMMUNITY): Payer: Medicare Other

## 2017-04-29 ENCOUNTER — Other Ambulatory Visit: Payer: Self-pay

## 2017-04-29 ENCOUNTER — Observation Stay (HOSPITAL_COMMUNITY)
Admission: EM | Admit: 2017-04-29 | Discharge: 2017-05-01 | Disposition: A | Discharge status: Home / Self Care | Payer: Medicare Other | Attending: Family Medicine | Admitting: Family Medicine

## 2017-04-29 ENCOUNTER — Encounter (HOSPITAL_COMMUNITY): Payer: Self-pay

## 2017-04-29 DIAGNOSIS — I2510 Atherosclerotic heart disease of native coronary artery without angina pectoris: Secondary | ICD-10-CM | POA: Diagnosis not present

## 2017-04-29 DIAGNOSIS — E785 Hyperlipidemia, unspecified: Secondary | ICD-10-CM | POA: Diagnosis not present

## 2017-04-29 DIAGNOSIS — E876 Hypokalemia: Secondary | ICD-10-CM | POA: Diagnosis not present

## 2017-04-29 DIAGNOSIS — C9110 Chronic lymphocytic leukemia of B-cell type not having achieved remission: Secondary | ICD-10-CM | POA: Diagnosis not present

## 2017-04-29 DIAGNOSIS — Z79899 Other long term (current) drug therapy: Secondary | ICD-10-CM | POA: Diagnosis not present

## 2017-04-29 DIAGNOSIS — R413 Other amnesia: Secondary | ICD-10-CM | POA: Diagnosis not present

## 2017-04-29 DIAGNOSIS — F0390 Unspecified dementia without behavioral disturbance: Secondary | ICD-10-CM | POA: Diagnosis not present

## 2017-04-29 DIAGNOSIS — J431 Panlobular emphysema: Secondary | ICD-10-CM | POA: Diagnosis not present

## 2017-04-29 DIAGNOSIS — R1033 Periumbilical pain: Secondary | ICD-10-CM | POA: Diagnosis not present

## 2017-04-29 DIAGNOSIS — Z888 Allergy status to other drugs, medicaments and biological substances status: Secondary | ICD-10-CM | POA: Diagnosis not present

## 2017-04-29 DIAGNOSIS — I700 Atherosclerosis of aorta: Secondary | ICD-10-CM | POA: Insufficient documentation

## 2017-04-29 DIAGNOSIS — I10 Essential (primary) hypertension: Secondary | ICD-10-CM | POA: Diagnosis not present

## 2017-04-29 DIAGNOSIS — M79602 Pain in left arm: Secondary | ICD-10-CM | POA: Diagnosis not present

## 2017-04-29 DIAGNOSIS — Z7952 Long term (current) use of systemic steroids: Secondary | ICD-10-CM | POA: Insufficient documentation

## 2017-04-29 DIAGNOSIS — Z7982 Long term (current) use of aspirin: Secondary | ICD-10-CM | POA: Diagnosis not present

## 2017-04-29 DIAGNOSIS — K2950 Unspecified chronic gastritis without bleeding: Secondary | ICD-10-CM

## 2017-04-29 DIAGNOSIS — J449 Chronic obstructive pulmonary disease, unspecified: Secondary | ICD-10-CM | POA: Diagnosis present

## 2017-04-29 DIAGNOSIS — K2970 Gastritis, unspecified, without bleeding: Secondary | ICD-10-CM | POA: Diagnosis present

## 2017-04-29 DIAGNOSIS — Z87891 Personal history of nicotine dependence: Secondary | ICD-10-CM | POA: Diagnosis not present

## 2017-04-29 DIAGNOSIS — Z955 Presence of coronary angioplasty implant and graft: Secondary | ICD-10-CM | POA: Diagnosis not present

## 2017-04-29 DIAGNOSIS — F329 Major depressive disorder, single episode, unspecified: Secondary | ICD-10-CM | POA: Insufficient documentation

## 2017-04-29 DIAGNOSIS — G459 Transient cerebral ischemic attack, unspecified: Principal | ICD-10-CM | POA: Diagnosis present

## 2017-04-29 DIAGNOSIS — R531 Weakness: Secondary | ICD-10-CM

## 2017-04-29 DIAGNOSIS — C9190 Lymphoid leukemia, unspecified not having achieved remission: Secondary | ICD-10-CM | POA: Diagnosis not present

## 2017-04-29 LAB — COMPREHENSIVE METABOLIC PANEL
ALBUMIN: 3.7 g/dL (ref 3.5–5.0)
ALT: 12 U/L — AB (ref 14–54)
AST: 19 U/L (ref 15–41)
Alkaline Phosphatase: 64 U/L (ref 38–126)
Anion gap: 9 (ref 5–15)
BUN: 14 mg/dL (ref 6–20)
CHLORIDE: 98 mmol/L — AB (ref 101–111)
CO2: 28 mmol/L (ref 22–32)
CREATININE: 0.88 mg/dL (ref 0.44–1.00)
Calcium: 9.1 mg/dL (ref 8.9–10.3)
GFR calc Af Amer: >60 mL/min (ref >60)
GFR, EST NON AFRICAN AMERICAN: 57 mL/min — AB (ref >60)
GLUCOSE: 101 mg/dL — AB (ref 65–99)
POTASSIUM: 3.4 mmol/L — AB (ref 3.5–5.1)
SODIUM: 135 mmol/L (ref 135–145)
Total Bilirubin: 0.4 mg/dL (ref 0.3–1.2)
Total Protein: 6.3 g/dL — ABNORMAL LOW (ref 6.5–8.1)

## 2017-04-29 LAB — PROTIME-INR
INR: 1.06
PROTHROMBIN TIME: 13.7 s (ref 11.4–15.2)

## 2017-04-29 LAB — CBC
HCT: 34.6 % — ABNORMAL LOW (ref 36.0–46.0)
Hemoglobin: 11.4 g/dL — ABNORMAL LOW (ref 12.0–15.0)
MCH: 29.1 pg (ref 26.0–34.0)
MCHC: 32.9 g/dL (ref 30.0–36.0)
MCV: 88.3 fL (ref 78.0–100.0)
PLATELETS: 157 10*3/uL (ref 150–400)
RBC: 3.92 MIL/uL (ref 3.87–5.11)
RDW: 15.8 % — AB (ref 11.5–15.5)
WBC: 10.1 10*3/uL (ref 4.0–10.5)

## 2017-04-29 LAB — URINALYSIS, ROUTINE W REFLEX MICROSCOPIC
BILIRUBIN URINE: NEGATIVE
GLUCOSE, UA: NEGATIVE mg/dL
Hgb urine dipstick: NEGATIVE
Ketones, ur: NEGATIVE mg/dL
Leukocytes, UA: NEGATIVE
Nitrite: NEGATIVE
PROTEIN: NEGATIVE mg/dL
Specific Gravity, Urine: 1.005 (ref 1.005–1.030)
pH: 7 (ref 5.0–8.0)

## 2017-04-29 LAB — CK: Total CK: 56 U/L (ref 38–234)

## 2017-04-29 LAB — ETHANOL

## 2017-04-29 LAB — DIFFERENTIAL
Basophils Absolute: 0 10*3/uL (ref 0.0–0.1)
Basophils Relative: 0 %
EOS ABS: 0.1 10*3/uL (ref 0.0–0.7)
Eosinophils Relative: 1 %
LYMPHS PCT: 69 %
Lymphs Abs: 7 10*3/uL — ABNORMAL HIGH (ref 0.7–4.0)
MONO ABS: 2.4 10*3/uL — AB (ref 0.1–1.0)
Monocytes Relative: 24 %
NEUTROS PCT: 6 %
Neutro Abs: 0.6 10*3/uL — ABNORMAL LOW (ref 1.7–7.7)

## 2017-04-29 LAB — RAPID URINE DRUG SCREEN, HOSP PERFORMED
AMPHETAMINES: NOT DETECTED
BARBITURATES: NOT DETECTED
Benzodiazepines: NOT DETECTED
COCAINE: NOT DETECTED
Opiates: NOT DETECTED
Tetrahydrocannabinol: NOT DETECTED

## 2017-04-29 LAB — I-STAT CHEM 8, ED
BUN: 12 mg/dL (ref 6–20)
CALCIUM ION: 1.18 mmol/L (ref 1.15–1.40)
CHLORIDE: 95 mmol/L — AB (ref 101–111)
Creatinine, Ser: 0.9 mg/dL (ref 0.44–1.00)
GLUCOSE: 99 mg/dL (ref 65–99)
HCT: 35 % — ABNORMAL LOW (ref 36.0–46.0)
Hemoglobin: 11.9 g/dL — ABNORMAL LOW (ref 12.0–15.0)
POTASSIUM: 3.4 mmol/L — AB (ref 3.5–5.1)
SODIUM: 136 mmol/L (ref 135–145)
TCO2: 27 mmol/L (ref 22–32)

## 2017-04-29 LAB — I-STAT TROPONIN, ED: TROPONIN I, POC: 0.01 ng/mL (ref 0.00–0.08)

## 2017-04-29 LAB — PHOSPHORUS: Phosphorus: 3.5 mg/dL (ref 2.5–4.6)

## 2017-04-29 LAB — MAGNESIUM: MAGNESIUM: 1.7 mg/dL (ref 1.7–2.4)

## 2017-04-29 LAB — APTT: APTT: 24 s (ref 24–36)

## 2017-04-29 LAB — TSH: TSH: 2.841 u[IU]/mL (ref 0.350–4.500)

## 2017-04-29 MED ORDER — SERTRALINE HCL 50 MG PO TABS
25.0000 mg | ORAL_TABLET | Freq: Every day | ORAL | Status: DC
  Administered 2017-04-29 – 2017-05-01 (×3): 25 mg via ORAL
  Filled 2017-04-29 (×3): qty 1

## 2017-04-29 MED ORDER — SODIUM CHLORIDE 0.9 % IV BOLUS (SEPSIS)
500.0000 mL | Freq: Once | INTRAVENOUS | Status: AC
  Administered 2017-04-29: 500 mL via INTRAVENOUS

## 2017-04-29 MED ORDER — HEPARIN SODIUM (PORCINE) 5000 UNIT/ML IJ SOLN
5000.0000 [IU] | Freq: Three times a day (TID) | INTRAMUSCULAR | Status: DC
  Administered 2017-04-29 – 2017-05-01 (×5): 5000 [IU] via SUBCUTANEOUS
  Filled 2017-04-29 (×2): qty 1

## 2017-04-29 MED ORDER — QUETIAPINE FUMARATE 25 MG PO TABS
12.5000 mg | ORAL_TABLET | Freq: Every day | ORAL | Status: DC
  Administered 2017-04-29 – 2017-04-30 (×2): 12.5 mg via ORAL
  Filled 2017-04-29 (×2): qty 1

## 2017-04-29 MED ORDER — ONDANSETRON HCL 4 MG PO TABS: 4.0000 mg | ORAL_TABLET | Freq: Four times a day (QID) | ORAL | Status: DC | PRN

## 2017-04-29 MED ORDER — ISOSORBIDE MONONITRATE ER 30 MG PO TB24
30.0000 mg | ORAL_TABLET | Freq: Every day | ORAL | Status: DC
  Administered 2017-04-29 – 2017-05-01 (×3): 30 mg via ORAL
  Filled 2017-04-29 (×3): qty 1

## 2017-04-29 MED ORDER — PANTOPRAZOLE SODIUM 40 MG PO TBEC
40.0000 mg | DELAYED_RELEASE_TABLET | Freq: Every day | ORAL | Status: DC
  Administered 2017-04-29 – 2017-05-01 (×3): 40 mg via ORAL
  Filled 2017-04-29 (×3): qty 1

## 2017-04-29 MED ORDER — POTASSIUM CHLORIDE ER 10 MEQ PO TBCR
10.0000 meq | EXTENDED_RELEASE_TABLET | Freq: Every day | ORAL | Status: DC
  Administered 2017-04-29: 10 meq via ORAL
  Filled 2017-04-29 (×3): qty 1

## 2017-04-29 MED ORDER — SODIUM CHLORIDE 0.9 % IV SOLN
INTRAVENOUS | Status: AC
  Administered 2017-04-29: 23:00:00 via INTRAVENOUS

## 2017-04-29 MED ORDER — METOPROLOL TARTRATE 25 MG PO TABS
25.0000 mg | ORAL_TABLET | Freq: Two times a day (BID) | ORAL | Status: DC
  Administered 2017-04-29 – 2017-05-01 (×4): 25 mg via ORAL
  Filled 2017-04-29 (×4): qty 1

## 2017-04-29 MED ORDER — AMLODIPINE BESYLATE 5 MG PO TABS
5.0000 mg | ORAL_TABLET | Freq: Every day | ORAL | Status: DC
  Administered 2017-04-29 – 2017-05-01 (×3): 5 mg via ORAL
  Filled 2017-04-29 (×3): qty 1

## 2017-04-29 MED ORDER — ONDANSETRON HCL 4 MG/2ML IJ SOLN: 4.0000 mg | Freq: Four times a day (QID) | INTRAMUSCULAR | Status: DC | PRN

## 2017-04-29 MED ORDER — ASPIRIN EC 81 MG PO TBEC
81.0000 mg | DELAYED_RELEASE_TABLET | Freq: Every day | ORAL | Status: DC
  Administered 2017-04-30 – 2017-05-01 (×2): 81 mg via ORAL
  Filled 2017-04-29 (×2): qty 1

## 2017-04-29 MED ORDER — ACETAMINOPHEN 325 MG PO TABS: 650.0000 mg | ORAL_TABLET | Freq: Four times a day (QID) | ORAL | Status: DC | PRN

## 2017-04-29 NOTE — ED Notes (Signed)
Patient has returned from MRI

## 2017-04-29 NOTE — H&P (Signed)
History and Physical    Carla Conway Pra GQQ:[id number] DOB: Jan 04, 1927 DOA: 04/29/2017  PCP: Lujean Amel, MD   I have briefly reviewed patients previous medical reports in [facility].  Patient coming from: home  Chief Complaint: generalized weakness and no feeling right.  HPI: Carla Conway is a 84y/o woman with PMH significant for CAD, HTN, HLD, CLL (essentially on remission), mild dementia, depression and GERD; who presented to ED after waking up this morning with new sudden onset of left upper extremity weakness, with associated generalized debility and just no feeling right. Patient went to bed w/o any complaints and remember waking up to use the bathroom and not having any symptoms initially. After further questioning, she reported some ongoing weakness since she started using HCTZ for BP control, also reported decrease appetite lately.  No CP, no fever, no chills, no nausea, no vomiting, no abd pain, no dysuria, no melena, no hematochezia.   Of note, patient has had increased in her zoloft in the last 3 days or so as well. HCTZ started on 04/22/17 to assist with BP control.  ED Course: code stroke called initially, but after evaluation by neurologist cancelled. Patient with neg CT head and neg MRI. Gentle hydration initiated and basic blood work/UA done; essentially unremarkable. TRH called to observe to complete TIA work up and provide further evaluation.  Review of Systems:  All other systems reviewed and apart from HPI, are negative.  Past Medical History:  Diagnosis Date  . Asthma   . CAD (coronary artery disease)   . Hyperlipidemia   . Hypertension     Past Surgical History:  Procedure Laterality Date  . CATARACT EXTRACTION    . CORONARY ANGIOPLASTY  10 -15 years ago   stent in [place] ,[place]  . IR FLUORO GUIDE PORT INSERTION RIGHT  10/23/2016  . IR US GUIDE VASC ACCESS RIGHT  10/23/2016    Social History  reports that she has quit smoking. Her smoking use  included cigarettes. She started smoking about 33 years ago. she has never used smokeless tobacco. She reports that she does not drink alcohol or use drugs.  Allergies  Allergen Reactions  . Rituximab Other (See Comments)    "She coded"/Upset stomach and blood pressure dropped    Family History  Problem Relation Age of Onset  . Cancer Father        colon    Prior to Admission medications   Medication Sig Start Date End Date Taking? Authorizing Provider  acetaminophen (TYLENOL) 500 MG tablet Take 650 mg by mouth daily as needed for mild pain or headache.    Yes [provider]  amLODipine (NORVASC) 5 MG tablet Take 1 tablet (5 mg total) by mouth daily. 04/03/17  Yes Adhikari Bk, Tamsen Meek, MD  COMBIVENT RESPIMAT 20-100 MCG/ACT AERS respimat Take 2 puffs by mouth every 6 (six) hours as needed for wheezing or shortness of breath.  04/16/13  Yes [provider]  isosorbide mononitrate (IMDUR) 30 MG 24 hr tablet Take 30 mg by mouth daily.  05/09/13  Yes [provider]  lidocaine-prilocaine (EMLA) cream Apply 1 application topically as needed. Patient taking differently: Apply 1 application topically as needed (For port-a-cath.).  10/27/16  Yes Gorsuch, Ni, MD  lovastatin (MEVACOR) 40 MG tablet Take 40 mg by mouth at bedtime.  05/09/13  Yes [provider]  metoprolol tartrate (LOPRESSOR) 25 MG tablet Take 0.5 tablets (12.5 mg total) by mouth 2 (two) times daily. 03/13/17 05/12/17  Yes Heath Lark, MD  Nutritional Supplements (ENSURE PO) Take 1 Can by mouth daily.   Yes [provider]  omeprazole (PRILOSEC) 20 MG capsule Take 1 capsule (20 mg total) by mouth daily. 01/06/17  Yes Gorsuch, Ni, MD  potassium chloride (K-DUR) 10 MEQ tablet Take 10 mEq by mouth daily. 03/24/16  Yes [provider]  QUEtiapine (SEROQUEL) 25 MG tablet Take 12.5 mg by mouth at bedtime.    Yes [provider]  sertraline (ZOLOFT) 50 MG tablet Take 50 mg by mouth daily.  04/17/17  Yes [provider]  triamterene-hydrochlorothiazide (MAXZIDE-25) 37.5-25 MG tablet Take 0.5 tablets by mouth 2 (two) times daily. 04/17/17  Yes [provider]  LEUKERAN 2 MG tablet TAKE 1 TABLET (2 MG TOTAL) BY MOUTH DAILY. GIVE ON AN EMPTY STOMACH 1 HOUR BEFORE OR 2 HOURS AFTER MEALS. 01/27/17   Heath Lark, MD  ondansetron (ZOFRAN) 8 MG tablet Take 1 tablet (8 mg total) by mouth every 8 (eight) hours as needed for nausea. Patient not taking: Reported on 04/29/2017 10/27/16   Heath Lark, MD  predniSONE (DELTASONE) 5 MG tablet Take 1 tablet (5 mg total) by mouth daily with breakfast. 11/13/16   Heath Lark, MD   Physical Exam: Vitals:   04/29/17 1635 04/29/17 1838 04/29/17 2038 04/29/17 2124  BP: (!) 171/66 (!) 187/81 (!) 191/77 (!) 167/76  Pulse: 70 68 72 71  Resp: 18 (!) 23 20 18   Temp:    98.4 F (36.9 C)  TempSrc:    Oral  SpO2: 95% 92% 93% 93%  Weight:    66 kg (145 lb 8.1 oz)  Height:    5' 6"  (1.676 m)   Constitutional: in no acute distress, denying CP and SOB, patient generally weak and debilitated. Eyes: PERTLA, lids and conjunctivae normal, no icterus ENMT: Mucous membranes slightly dry on exam. Posterior pharynx clear of any exudate or lesions. No Thrush Neck: supple, no masses, no thyromegaly, no JVD Respiratory: clear to auscultation bilaterally, no wheezing, no crackles. Normal respiratory effort. No accessory muscle use.  Cardiovascular: S1 & S2 heard, regular rate and rhythm, no  rubs / gallops. No extremity edema. 2+ pedal pulses. No carotid bruits.  Abdomen: No distension, no tenderness, no masses palpated. No hepatosplenomegaly. Bowel sounds normal.  Musculoskeletal: no clubbing / cyanosis. No joint deformity upper and lower extremities. No contractures. Normal muscle tone.  Skin: no open lesions, no ulcers. No induration Neurologic: CN 2-12 grossly intact. Sensation intact, DTR normal. Strength 4/5 in all 4 limbs due to poor effort. Patient  expressed feeling weaker on LUE, but able to performed with that arm as good as with the rest of her limbs. Psychiatric: repetitive thoughts. Alert and oriented x 3. Normal mood. Positive forgetfulness on exam.   Labs on Admission: I have personally reviewed following labs and imaging studies  CBC: Recent Labs  Lab 04/28/17 1238 04/29/17 1100 04/29/17 1143  WBC 12.5* 10.1  --   NEUTROABS 0.9* 0.6*  --   HGB 11.9 11.4* 11.9*  HCT 36.1 34.6* 35.0*  MCV 89.6 88.3  --   PLT 156 157  --    Basic Metabolic Panel: Recent Labs  Lab 04/28/17 1238 04/29/17 1100 04/29/17 1143 04/29/17 2130  NA 134* 135 136  --   K 4.2 3.4* 3.4*  --   CL 98 98* 95*  --   CO2 29 28  --   --   GLUCOSE 87 101* 99  --  BUN 15 14 12   --   CREATININE 0.98 0.88 0.90  --   CALCIUM 9.3 9.1  --   --   MG  --   --   --  1.7  PHOS  --   --   --  3.5   Liver Function Tests: Recent Labs  Lab 04/28/17 1238 04/29/17 1100  AST 16 19  ALT 12 12*  ALKPHOS 69 64  BILITOT 0.4 0.4  PROT 6.2* 6.3*  ALBUMIN 3.8 3.7   Coagulation Profile: Recent Labs  Lab 04/29/17 1100  INR 1.06   Cardiac Enzymes: Recent Labs  Lab 04/29/17 1100  CKTOTAL 56   Urine analysis:    Component Value Date/Time   COLORURINE STRAW (A) 04/29/2017 1212   APPEARANCEUR CLEAR 04/29/2017 1212   LABSPEC 1.005 04/29/2017 1212   LABSPEC 1.015 11/13/2016 0848   PHURINE 7.0 04/29/2017 1212   GLUCOSEU NEGATIVE 04/29/2017 1212   GLUCOSEU Negative 11/13/2016 0848   HGBUR NEGATIVE 04/29/2017 1212   BILIRUBINUR NEGATIVE 04/29/2017 1212   BILIRUBINUR Negative 11/13/2016 0848   KETONESUR NEGATIVE 04/29/2017 1212   PROTEINUR NEGATIVE 04/29/2017 1212   UROBILINOGEN 0.2 11/13/2016 0848   NITRITE NEGATIVE 04/29/2017 1212   LEUKOCYTESUR NEGATIVE 04/29/2017 1212   LEUKOCYTESUR Large 11/13/2016 0848    Radiological Exams on Admission: Mr Brain Wo Contrast  Result Date: 04/29/2017 CLINICAL DATA:  Increasing weakness and dizziness with  left arm and shoulder pain since this morning. Confusion. EXAM: MRI HEAD WITHOUT CONTRAST TECHNIQUE: Multiplanar, multiecho pulse sequences of the brain and surrounding structures were obtained without intravenous contrast. COMPARISON:  Head CT earlier today FINDINGS: Brain: No acute infarction, hemorrhage, hydrocephalus, extra-axial collection or mass lesion. Chronic small-vessel disease with confluent gliosis in the cerebral white matter and patchy across the pons. Age congruent cerebral volume. Vascular: Major flow voids are preserved. Skull and upper cervical spine: Negative for marrow lesion. Sinuses/Orbits: Bilateral cataract resection. Other: Best seen on sagittal T1 weighted imaging is enlarged homogeneous nodes in the bilateral upper neck. Patient has history of CLL. Progressively motion degraded study, best obtainable due to confusion. IMPRESSION: 1. No acute finding, including infarct. 2. Moderate to advanced chronic small vessel ischemia seen in the cerebral white matter and pons. 3. Enlarged upper cervical lymph nodes correlating with history of CLL. 4. Moderately motion degraded exam. Electronically Signed   By: Monte Fantasia M.D.   On: 04/29/2017 15:28   Ct Head Code Stroke Wo Contrast  Result Date: 04/29/2017 CLINICAL DATA:  Code stroke. Generalized weakness, particularly in the left leg. EXAM: CT HEAD WITHOUT CONTRAST TECHNIQUE: Contiguous axial images were obtained from the base of the skull through the vertex without intravenous contrast. COMPARISON:  03/31/2017 FINDINGS: Brain: There is no evidence of acute cortically based infarct, intracranial hemorrhage, mass, midline shift, or extra-axial fluid collection. Patchy hypoattenuation in the left caudate and lentiform nuclei is stable to minimally more prominent than on the prior study and likely reflects chronic lacunar infarcts. Patchy to confluent hypodensities in the cerebral white matter bilaterally are similar to the prior study and  nonspecific but compatible with moderate chronic small vessel ischemic disease. A dilated perivascular space is noted in the inferior right basal ganglia. Mild generalized cerebral atrophy is not greater than expected for age. Vascular: Calcified atherosclerosis at the skull base. No hyperdense vessel. Skull: No fracture or focal osseous lesion. Sinuses/Orbits: Partially visualized postsurgical changes in the paranasal sinuses. Clear mastoid air cells. Bilateral cataract extraction. Other: None. ASPECTS [facility] Stroke Program  Early CT Score) - Ganglionic level infarction (caudate, lentiform nuclei, internal capsule, insula, M1-M3 cortex): 7 - Supraganglionic infarction (M4-M6 cortex): 3 Total score (0-10 with 10 being normal): 10 IMPRESSION: 1. No evidence of acute intracranial abnormality. 2. ASPECTS is 10. 3. Moderate chronic small vessel ischemic disease. These results were called by telephone at the time of interpretation on 04/29/2017 at 11:29 am to Dr. Julianne Rice , who verbally acknowledged these results. Electronically Signed   By: Logan Bores M.D.   On: 04/29/2017 11:30    EKG:  -no acute ischemic changes appreciated.  Assessment/Plan 1-generalized weakness(especially left upper extremity weakness): -patient woke up this morning with generalized weakness, especially LUE weakness. -no dysarthria and no other focal deficit  -CT head and MRI neg for acute abnormalities  -patient mildly hypokalemic and actively using HCTZ for the last week or so. -after discussing with neurology, ok to stay at [facility]; complete TIA work up and assess functionality PT/OT -will check CK level, Mg, PO4 and hold on statins for now -also will discontinue HCTZ and cut in half her zoloft -replete electrolytes, provide gentle hydration and follow clinical response. -no signs of infection appreciated. -baby aspirin for secondary prevention started.  2-HTN (hypertension) -elevated -will allow permissive HTN in  setting of TIA -with anticipated adjustment on BP meds over the next 24 hours for better BP control -HCTZ discontinued.  3-COPD (chronic obstructive pulmonary disease) (HCC) -stable and well controlled -no wheezing -continue PRN duoneb  4-CLL (chronic lymphocytic leukemia) (HCC) -doing well -per oncology note: stopping steroids and Leukeran  -outpatient PET-scan pending  -Dr. Alvy Bimler made aware of patients admission   5-Poor memory -mild dementia -provide supportive care -per daughter, at baseline currently   6-Gastritis -continue PPi  7-Generalized weakness -as mentioned above will ask for PT/OT evaluation   8-Hypokalemia -D/C HCTZ -replete electrolytes as needed   9-Depression -will resume seroquel and half dose zoloft   10-hx of CAD -no CP -no acute ischemic changes on EKG -continue B-blocker and Imdur  -started on low dose ASA -holding statins initially, but with plan to resume therapy if CK level WNL.  Time: 60 minutes    DVT prophylaxis: Lovenox  Code Status: Full code   Family Communication: daughter at bedside  Disposition Plan: to be determined; hopefully back home with [facility] services in the next 24 hours or so. Consults called: neurology (tele-medicine/neurology) Admission status: observation, LOS < 2 midnight, telemetry bed   Barton Dubois MD [facility] Pager [phone]  If 7PM-7AM, please contact night-coverage [email or web address] Password [facility]  04/29/2017, 10:37 PM

## 2017-04-29 NOTE — ED Notes (Signed)
Pt drank water independently and swallowed with no distress

## 2017-04-29 NOTE — ED Notes (Signed)
Patient transported to MRI 

## 2017-04-29 NOTE — Consult Note (Signed)
[facility] TeleNeurology Consult Services  Impression:  Patient with 6 months of episodic orthostatic dizziness/lightheadedness and generalized weakness worsened today.  She has some mild diminished sensation in the LUE on exam, but she is not sure how long that has been present.  Otherwise her exam is unremarkable.  Her symptoms sound more like orthostatic hypotension.  There was possible indication of LLE weakness on presentation, but this is not present on my exam (she can sustain antigrav without problem, just some hip pain).  Would not pursue tPA at this time - LUE sensory change of unknown chronicity and not disabling, no other focal deficits.   Not a tpa candidate due to: no disabling focal deficits, low suspicion for stroke Not an NIR candidate due to: presentation not suggestive of LVO   Differential Diagnosis:   1. Cardioembolic stroke  2. Small vessel disease/lacune  3. Thromboembolic, artery-to-artery mechanism  4. Hypercoagulable state-related infarct  5. Transient ischemic attack  6. Thrombotic mechanism, large artery disease  7. Orthostatic hypotension  Comments:   Door time: 1034 [facility] contacted: [phone] [facility] at bedside: 1113 NIHSS assessment time: 1120 (pt in CT on log in)  Recommendations:  CV work up per [facility] team - check orthostatic vitals now MRI brain wo inpatient neurology consultation Inpatient stroke evaluation as per Neurology/ Internal Medicine Discussed with ED MD  -----------------------------------------------------------------------------------------  CC: stroke alert  History of Present Illness  Patient is an 84 year old woman with a history of HTN and CLL who presented to the ED with dizziness and weakness.  She states that for the past 6 months she's been experiencing lightheadedness when she gets up from a seated or laying position.  She has a wave of generalized weakness and feels she has to sit on the edge of  her bed or return to being seated for a minute or two for the symptoms to pass.  Today she woke feeling OK at 0700, and around 0730 she felt her chronic orthostatic symptoms come on again.  She states it was worse than ever and she had trouble even standing due to generalized weakness and lightheadedness.  She denies any focal weakness, sensory changes, vision changes, speech/language changes.  No CP or HA.    Diagnostic: CT head wo - nothing acute  Exam: NIHSS score: 1 1a LOC: 0  1b Questions: 0  1c Commands: 0  2 Gaze: 0  3 VF: 0  4 Face: 0  5a Motor arm left: 0  5b Motor arm right: 0  6a Motor leg left: 0  6b Motor leg right: 0  7 Ataxia: 0  8 Sensory: 1 (left arm less sensitive) 9: Language: 0  10: Speech: 0  11: Extinction: 0       Medical Decision Making:  - Extensive number of diagnosis or management options are considered above.   - Extensive amount of complex data reviewed.   - High risk of complication and/or morbidity or mortality are associated with differential diagnostic considerations above.  - There may be Uncertain outcome and increased probability of prolonged functional impairment or high probability of severe prolonged functional impairment associated with some of these differential diagnosis.   Medical Data Reviewed:  1.Data reviewed include clinical labs, radiology,  Medical Tests;   2.Tests results discussed w/performing or interpreting physician;   3.Obtaining/reviewing old medical records;  4.Obtaining case history from another source;  5.Independent review of image, tracing or specimen.    Patient was informed the Neurology Consult would happen  via telehealth (remote video) and consented to receiving care in this manner.

## 2017-04-29 NOTE — ED Provider Notes (Signed)
[place] DEPT Provider Note   CSN: [id number] Arrival date & time: 04/29/17  1034     History   Chief Complaint Chief Complaint  Patient presents with  . Weakness    HPI Carla Conway is a 84 y.o. female.  HPI Patient was in her normal state of health when she woke up this morning around 7:00.  She ambulated to the bathroom using her walker.  She states she sat in her chair and had acute onset dizziness.  She had difficulty ambulating with left leg weakness.  Denies any nausea.  No visual changes.  No speech changes.  Denies shortness of breath or chest pain.  She has ongoing left shoulder pain which she states is been present for several weeks.  She was seen by her oncologist yesterday and was doing well.  Taken off Leukeran and prednisone for CLL.  Endorses urinary frequency but denies dysuria, fever or chills. Past Medical History:  Diagnosis Date  . Asthma   . CAD (coronary artery disease)   . Hyperlipidemia   . Hypertension     Patient Active Problem List   Diagnosis Date Noted  . Abdominal pain 04/28/2017  . Elevated CK 04/01/2017  . AKI (acute kidney injury) ([place]) 03/31/2017  . Dehydration 03/31/2017  . Generalized weakness 03/31/2017  . External hemorrhoids without complication 97/05/6376  . Port-A-Cath in place 01/06/2017  . Gastritis 01/06/2017  . Poor memory 12/11/2016  . Dysuria 11/13/2016  . Anaphylaxis 10/29/2016  . Anaphylactoid reaction 10/29/2016  . Goals of care, counseling/discussion 10/28/2016  . Chronic kidney disease, stage III (moderate) ([place]) 10/17/2016  . Lymphoma, small lymphocytic ([place]) 10/15/2016  . Neutropenia ([place]) 09/23/2016  . CLL (chronic lymphocytic leukemia) ([place]) 05/11/2016  . Acute on chronic diastolic heart failure ([place]) 05/10/2016  . Influenza A 05/09/2016  . Lymphadenopathy of head and neck region 05/09/2016  . Lymphadenopathy, thoracic 05/09/2016  . COPD (chronic obstructive pulmonary disease)  ([place]) 05/09/2016  . Debility 05/09/2016  . Syncopal episodes 05/09/2016  . Syncope 05/08/2016  . HTN (hypertension) 05/18/2013  . CAD in native artery 05/18/2013    Past Surgical History:  Procedure Laterality Date  . CATARACT EXTRACTION    . CORONARY ANGIOPLASTY  10 -15 years ago   stent in [place] ,[place]  . IR FLUORO GUIDE PORT INSERTION RIGHT  10/23/2016  . IR US GUIDE VASC ACCESS RIGHT  10/23/2016    OB History    No data available       Home Medications    Prior to Admission medications   Medication Sig Start Date End Date Taking? Authorizing Provider  acetaminophen (TYLENOL) 500 MG tablet Take 650 mg by mouth daily as needed for mild pain or headache.    Yes [provider]  amLODipine (NORVASC) 5 MG tablet Take 1 tablet (5 mg total) by mouth daily. 04/03/17  Yes Adhikari Bk, Tamsen Meek, MD  COMBIVENT RESPIMAT 20-100 MCG/ACT AERS respimat Take 2 puffs by mouth every 6 (six) hours as needed for wheezing or shortness of breath.  04/16/13  Yes [provider]  isosorbide mononitrate (IMDUR) 30 MG 24 hr tablet Take 30 mg by mouth daily.  05/09/13  Yes [provider]  lidocaine-prilocaine (EMLA) cream Apply 1 application topically as needed. Patient taking differently: Apply 1 application topically as needed (For port-a-cath.).  10/27/16  Yes Gorsuch, Ni, MD  lovastatin (MEVACOR) 40 MG tablet Take 40 mg by mouth at bedtime.  05/09/13  Yes [provider]  metoprolol tartrate (LOPRESSOR) 25 MG tablet Take 0.5 tablets (12.5 mg total) by mouth 2 (two) times daily. 03/13/17 05/12/17 Yes Heath Lark, MD  Nutritional Supplements (ENSURE PO) Take 1 Can by mouth daily.   Yes [provider]  omeprazole (PRILOSEC) 20 MG capsule Take 1 capsule (20 mg total) by mouth daily. 01/06/17  Yes Gorsuch, Ni, MD  potassium chloride (K-DUR) 10 MEQ tablet Take 10 mEq by mouth daily. 03/24/16  Yes [provider]  QUEtiapine (SEROQUEL) 25 MG tablet Take 12.5 mg  by mouth at bedtime.    Yes [provider]  sertraline (ZOLOFT) 50 MG tablet Take 50 mg by mouth daily. 04/17/17  Yes [provider]  triamterene-hydrochlorothiazide (MAXZIDE-25) 37.5-25 MG tablet Take 0.5 tablets by mouth 2 (two) times daily. 04/17/17  Yes [provider]  LEUKERAN 2 MG tablet TAKE 1 TABLET (2 MG TOTAL) BY MOUTH DAILY. GIVE ON AN EMPTY STOMACH 1 HOUR BEFORE OR 2 HOURS AFTER MEALS. 01/27/17   Heath Lark, MD  ondansetron (ZOFRAN) 8 MG tablet Take 1 tablet (8 mg total) by mouth every 8 (eight) hours as needed for nausea. Patient not taking: Reported on 04/29/2017 10/27/16   Heath Lark, MD  predniSONE (DELTASONE) 5 MG tablet Take 1 tablet (5 mg total) by mouth daily with breakfast. 11/13/16   Heath Lark, MD  promethazine (PHENERGAN) 12.5 MG tablet Take 1 tablet (12.5 mg total) by mouth every 6 (six) hours as needed for nausea. Patient not taking: Reported on 04/29/2017 10/27/16   Heath Lark, MD    Family History Family History  Problem Relation Age of Onset  . Cancer Father        colon    Social History Social History   Tobacco Use  . Smoking status: Former Smoker    Types: Cigarettes    Start date: 05/18/1983  . Smokeless tobacco: Never Used  Substance Use Topics  . Alcohol use: No  . Drug use: No     Allergies   Rituximab   Review of Systems Review of Systems  Constitutional: Positive for fatigue. Negative for chills and fever.  HENT: Negative for trouble swallowing and voice change.   Eyes: Negative for visual disturbance.  Respiratory: Negative for cough and shortness of breath.   Cardiovascular: Negative for chest pain.  Gastrointestinal: Negative for abdominal pain, constipation, diarrhea, nausea and vomiting.  Genitourinary: Positive for frequency. Negative for difficulty urinating, dysuria, flank pain and hematuria.  Musculoskeletal: Positive for arthralgias and gait problem. Negative for neck pain.  Skin: Negative for rash  and wound.  Neurological: Positive for dizziness, weakness and light-headedness. Negative for speech difficulty, numbness and headaches.  All other systems reviewed and are negative.    Physical Exam Updated Vital Signs BP (!) 176/75   Pulse 74   Temp 98.1 F (36.7 C)   Resp 20   Ht 5' 6"  (1.676 m)   Wt 66.7 kg (147 lb)   SpO2 95%   BMI 23.73 kg/m   Physical Exam  Constitutional: She is oriented to person, place, and time. She appears well-developed and well-nourished. No distress.  HENT:  Head: Normocephalic and atraumatic.  Mouth/Throat: Oropharynx is clear and moist. No oropharyngeal exudate.  Cranial nerves II through XII grossly intact.  Eyes: EOM are normal. Pupils are equal, round, and reactive to light.  Few beats of horizontal nystagmus especially when looking left.  Neck: Normal range of motion. Neck supple.  Cardiovascular: Normal rate and regular rhythm. Exam reveals no  gallop and no friction rub.  No murmur heard. Pulmonary/Chest: Effort normal and breath sounds normal. No stridor. No respiratory distress. She has no wheezes. She has no rales. She exhibits no tenderness.  Abdominal: Soft. Bowel sounds are normal. There is no tenderness. There is no rebound and no guarding.  Musculoskeletal: Normal range of motion. She exhibits no edema or tenderness.  Lymphadenopathy:    She has no cervical adenopathy.  Neurological: She is alert and oriented to person, place, and time.  Patient is alert and oriented x3 with clear, goal oriented speech. Patient has 5/5 motor in bilateral upper and right lower extremities.  4/5 motor left lower extremity. sensation is intact to light touch.  Right-sided finger-to-nose is slow and deliberate.   Skin: Skin is warm and dry. Capillary refill takes less than 2 seconds. No rash noted. She is not diaphoretic. No erythema.  Psychiatric: She has a normal mood and affect. Her behavior is normal.  Nursing note and vitals reviewed.    ED  Treatments / Results  Labs (all labs ordered are listed, but only abnormal results are displayed) Labs Reviewed  CBC - Abnormal; Notable for the following components:      Result Value   Hemoglobin 11.4 (*)    HCT 34.6 (*)    RDW 15.8 (*)    All other components within normal limits  DIFFERENTIAL - Abnormal; Notable for the following components:   Neutro Abs 0.6 (*)    Lymphs Abs 7.0 (*)    Monocytes Absolute 2.4 (*)    All other components within normal limits  COMPREHENSIVE METABOLIC PANEL - Abnormal; Notable for the following components:   Potassium 3.4 (*)    Chloride 98 (*)    Glucose, Bld 101 (*)    Total Protein 6.3 (*)    ALT 12 (*)    GFR calc non Af Amer 57 (*)    All other components within normal limits  URINALYSIS, ROUTINE W REFLEX MICROSCOPIC - Abnormal; Notable for the following components:   Color, Urine STRAW (*)    All other components within normal limits  I-STAT CHEM 8, ED - Abnormal; Notable for the following components:   Potassium 3.4 (*)    Chloride 95 (*)    Hemoglobin 11.9 (*)    HCT 35.0 (*)    All other components within normal limits  ETHANOL  PROTIME-INR  APTT  RAPID URINE DRUG SCREEN, HOSP PERFORMED  CK  I-STAT TROPONIN, ED    EKG  EKG Interpretation  Date/Time:  Wednesday April 29 2017 11:01:01 EST Ventricular Rate:  77 PR Interval:    QRS Duration: 97 QT Interval:  387 QTC Calculation: 438 R Axis:   8 Text Interpretation:  Sinus rhythm Atrial premature complexes Prolonged PR interval RSR' in V1 or V2, right VCD or RVH Confirmed by Julianne Rice [phone]) on 04/29/2017 11:08:38 AM       Radiology Mr Brain Wo Contrast  Result Date: 04/29/2017 CLINICAL DATA:  Increasing weakness and dizziness with left arm and shoulder pain since this morning. Confusion. EXAM: MRI HEAD WITHOUT CONTRAST TECHNIQUE: Multiplanar, multiecho pulse sequences of the brain and surrounding structures were obtained without intravenous contrast.  COMPARISON:  Head CT earlier today FINDINGS: Brain: No acute infarction, hemorrhage, hydrocephalus, extra-axial collection or mass lesion. Chronic small-vessel disease with confluent gliosis in the cerebral white matter and patchy across the pons. Age congruent cerebral volume. Vascular: Major flow voids are preserved. Skull and upper cervical spine: Negative for marrow  lesion. Sinuses/Orbits: Bilateral cataract resection. Other: Best seen on sagittal T1 weighted imaging is enlarged homogeneous nodes in the bilateral upper neck. Patient has history of CLL. Progressively motion degraded study, best obtainable due to confusion. IMPRESSION: 1. No acute finding, including infarct. 2. Moderate to advanced chronic small vessel ischemia seen in the cerebral white matter and pons. 3. Enlarged upper cervical lymph nodes correlating with history of CLL. 4. Moderately motion degraded exam. Electronically Signed   By: Monte Fantasia M.D.   On: 04/29/2017 15:28   Ct Head Code Stroke Wo Contrast  Result Date: 04/29/2017 CLINICAL DATA:  Code stroke. Generalized weakness, particularly in the left leg. EXAM: CT HEAD WITHOUT CONTRAST TECHNIQUE: Contiguous axial images were obtained from the base of the skull through the vertex without intravenous contrast. COMPARISON:  03/31/2017 FINDINGS: Brain: There is no evidence of acute cortically based infarct, intracranial hemorrhage, mass, midline shift, or extra-axial fluid collection. Patchy hypoattenuation in the left caudate and lentiform nuclei is stable to minimally more prominent than on the prior study and likely reflects chronic lacunar infarcts. Patchy to confluent hypodensities in the cerebral white matter bilaterally are similar to the prior study and nonspecific but compatible with moderate chronic small vessel ischemic disease. A dilated perivascular space is noted in the inferior right basal ganglia. Mild generalized cerebral atrophy is not greater than expected for age.  Vascular: Calcified atherosclerosis at the skull base. No hyperdense vessel. Skull: No fracture or focal osseous lesion. Sinuses/Orbits: Partially visualized postsurgical changes in the paranasal sinuses. Clear mastoid air cells. Bilateral cataract extraction. Other: None. ASPECTS [facility] Stroke Program Early CT Score) - Ganglionic level infarction (caudate, lentiform nuclei, internal capsule, insula, M1-M3 cortex): 7 - Supraganglionic infarction (M4-M6 cortex): 3 Total score (0-10 with 10 being normal): 10 IMPRESSION: 1. No evidence of acute intracranial abnormality. 2. ASPECTS is 10. 3. Moderate chronic small vessel ischemic disease. These results were called by telephone at the time of interpretation on 04/29/2017 at 11:29 am to Dr. Julianne Rice , who verbally acknowledged these results. Electronically Signed   By: Logan Bores M.D.   On: 04/29/2017 11:30    Procedures Procedures (including critical care time)  Medications Ordered in ED Medications  sodium chloride 0.9 % bolus 500 mL (not administered)     Initial Impression / Assessment and Plan / ED Course  I have reviewed the triage vital signs and the nursing notes.  Pertinent labs & imaging results that were available during my care of the patient were reviewed by me and considered in my medical decision making (see chart for details).     Given left leg weakness and gait abnormality will call code stroke.  Last normal at 7:00 am. Evaluated by tele-neurology.  Code stroke was deactivated.  Patient continues to be unable to sit or stand.  Will get MRI brain.  MRI without acute findings.  Patient still is unable to sit up and stand up.  Discussed with hospitalist who will see patient and admit. Final Clinical Impressions(s) / ED Diagnoses   Final diagnoses:  Weakness    ED Discharge Orders    None       Julianne Rice, MD 04/29/17 [phone]

## 2017-04-29 NOTE — ED Triage Notes (Signed)
EMS reports from home, increasing weakness and dizziness with left arm and shoulder pain since this morning. Hx of asthma Hypertension and lymphoma.  BP 175/81 HR 78 Resp 16 Spo2 96 CBG 132

## 2017-04-29 NOTE — ED Notes (Signed)
Pt states she is too weak to sit or stand.

## 2017-04-30 ENCOUNTER — Observation Stay (HOSPITAL_COMMUNITY): Payer: Medicare Other

## 2017-04-30 ENCOUNTER — Observation Stay (HOSPITAL_BASED_OUTPATIENT_CLINIC_OR_DEPARTMENT_OTHER): Payer: Medicare Other

## 2017-04-30 DIAGNOSIS — C9190 Lymphoid leukemia, unspecified not having achieved remission: Secondary | ICD-10-CM

## 2017-04-30 DIAGNOSIS — R109 Unspecified abdominal pain: Secondary | ICD-10-CM

## 2017-04-30 DIAGNOSIS — R531 Weakness: Secondary | ICD-10-CM

## 2017-04-30 DIAGNOSIS — E876 Hypokalemia: Secondary | ICD-10-CM

## 2017-04-30 DIAGNOSIS — C9110 Chronic lymphocytic leukemia of B-cell type not having achieved remission: Secondary | ICD-10-CM

## 2017-04-30 DIAGNOSIS — I10 Essential (primary) hypertension: Secondary | ICD-10-CM | POA: Diagnosis not present

## 2017-04-30 DIAGNOSIS — J431 Panlobular emphysema: Secondary | ICD-10-CM | POA: Diagnosis not present

## 2017-04-30 DIAGNOSIS — G459 Transient cerebral ischemic attack, unspecified: Secondary | ICD-10-CM | POA: Diagnosis not present

## 2017-04-30 LAB — LIPID PANEL
CHOLESTEROL: 102 mg/dL (ref 0–200)
HDL: 27 mg/dL — ABNORMAL LOW (ref >40)
LDL Cholesterol: 54 mg/dL (ref 0–99)
Total CHOL/HDL Ratio: 3.8 RATIO
Triglycerides: 104 mg/dL (ref <150)
VLDL: 21 mg/dL (ref 0–40)

## 2017-04-30 LAB — BASIC METABOLIC PANEL
Anion gap: 6 (ref 5–15)
BUN: 13 mg/dL (ref 6–20)
CALCIUM: 8.6 mg/dL — AB (ref 8.9–10.3)
CO2: 26 mmol/L (ref 22–32)
Chloride: 104 mmol/L (ref 101–111)
Creatinine, Ser: 0.85 mg/dL (ref 0.44–1.00)
GFR calc Af Amer: >60 mL/min (ref >60)
GFR, EST NON AFRICAN AMERICAN: 59 mL/min — AB (ref >60)
GLUCOSE: 93 mg/dL (ref 65–99)
POTASSIUM: 3.1 mmol/L — AB (ref 3.5–5.1)
SODIUM: 136 mmol/L (ref 135–145)

## 2017-04-30 LAB — HEMOGLOBIN A1C
HEMOGLOBIN A1C: 5.9 % — AB (ref 4.8–5.6)
MEAN PLASMA GLUCOSE: 122.63 mg/dL

## 2017-04-30 MED ORDER — MAGNESIUM OXIDE 400 (241.3 MG) MG PO TABS
400.0000 mg | ORAL_TABLET | Freq: Two times a day (BID) | ORAL | Status: DC
  Administered 2017-04-30 – 2017-05-01 (×3): 400 mg via ORAL
  Filled 2017-04-30 (×3): qty 1

## 2017-04-30 MED ORDER — IOPAMIDOL (ISOVUE-300) INJECTION 61%
100.0000 mL | Freq: Once | INTRAVENOUS | Status: AC | PRN
  Administered 2017-04-30: 100 mL via INTRAVENOUS

## 2017-04-30 MED ORDER — POTASSIUM CHLORIDE CRYS ER 20 MEQ PO TBCR
20.0000 meq | EXTENDED_RELEASE_TABLET | Freq: Every day | ORAL | Status: DC
  Administered 2017-05-01: 20 meq via ORAL
  Filled 2017-04-30 (×2): qty 1

## 2017-04-30 MED ORDER — IOPAMIDOL (ISOVUE-300) INJECTION 61%
INTRAVENOUS | Status: AC
  Filled 2017-04-30: qty 100

## 2017-04-30 MED ORDER — IOPAMIDOL (ISOVUE-300) INJECTION 61%: 15.0000 mL | Freq: Once | INTRAVENOUS | Status: DC | PRN

## 2017-04-30 MED ORDER — IOPAMIDOL (ISOVUE-300) INJECTION 61%
INTRAVENOUS | Status: AC
  Filled 2017-04-30: qty 30

## 2017-04-30 MED ORDER — POTASSIUM CHLORIDE CRYS ER 20 MEQ PO TBCR
40.0000 meq | EXTENDED_RELEASE_TABLET | ORAL | Status: AC
  Administered 2017-04-30 (×2): 40 meq via ORAL
  Filled 2017-04-30 (×2): qty 2

## 2017-04-30 MED ORDER — TRIAMTERENE-HCTZ 37.5-25 MG PO TABS
0.5000 | ORAL_TABLET | Freq: Every day | ORAL | Status: DC
  Administered 2017-04-30 – 2017-05-01 (×2): 0.5 via ORAL
  Filled 2017-04-30 (×2): qty 0.5

## 2017-04-30 NOTE — Evaluation (Signed)
Physical Therapy Evaluation Patient Details Name: Carla Conway MRN: [id number] DOB: 1926/09/18 Today's Date: 04/30/2017   History of Present Illness  84 yo female admitted with dizziness, weakness. Hx of CLL, dementia, COPD, CKD, syncopal episodes  Clinical Impression  On eval, pt required Min assist for mobility. She walked ~100 feet with a RW. Pt presents with general weakness, decreased activity tolerance, and impaired gait and balance. She did c/o dizziness with sitting up at EOB. However,she denied any further dizziness during ambulation. She fatigues fairly easily and is fearful of falling. Recommend ST rehab at SNF unless family can provide 24 hour supervision/assist initially.     Follow Up Recommendations SNF(unless pt can arrange to have 24 hour supervision/assist initially until she returns to baseline)    Equipment Recommendations  None recommended by PT    Recommendations for Other Services       Precautions / Restrictions Precautions Precautions: Fall Restrictions Weight Bearing Restrictions: No      Mobility  Bed Mobility Overal bed mobility: Needs Assistance Bed Mobility: Supine to Sit     Supine to sit: HOB elevated;Min assist     General bed mobility comments: Assist for trunk to upright. Increased time. Pt c/o dizziness  Transfers Overall transfer level: Needs assistance Equipment used: Rolling walker (2 wheeled) Transfers: Sit to/from Stand Sit to Stand: Min assist         General transfer comment: Assist to steady. Cues for safety. Pt stood from toilet unassisted with use of grabbar.   Ambulation/Gait Ambulation/Gait assistance: Min assist Ambulation Distance (Feet): 100 Feet Assistive device: Rolling walker (2 wheeled) Gait Pattern/deviations: Step-through pattern;Decreased stride length     General Gait Details: Assist to steady pt and navigate safely with walker. Pt denied dizziness. She fatigued fairly easily and requested to return to  room  Stairs            Wheelchair Mobility    Modified Rankin (Stroke Patients Only)       Balance Overall balance assessment: Needs assistance         Standing balance support: Bilateral upper extremity supported Standing balance-Leahy Scale: Poor                               Pertinent Vitals/Pain Pain Assessment: No/denies pain    Home Living Family/patient expects to be discharged to:: Unsure Living Arrangements: Alone   Type of Home: Apartment Home Access: Level entry     Home Layout: One level Home Equipment: Cane - single point;Walker - 4 wheels      Prior Function Level of Independence: Independent with assistive device(s)   Gait / Transfers Assistance Needed: rollator for ambulation  ADL's / Homemaking Assistance Needed: Mod Ind with bathing, dressing  Comments: pt/family report pt living independently, using rollator and performing all ADLs independently     Hand Dominance        Extremity/Trunk Assessment   Upper Extremity Assessment Upper Extremity Assessment: Overall WFL for tasks assessed    Lower Extremity Assessment Lower Extremity Assessment: Generalized weakness    Cervical / Trunk Assessment Cervical / Trunk Assessment: Normal  Communication   Communication: HOH  Cognition Arousal/Alertness: Awake/alert Behavior During Therapy: WFL for tasks assessed/performed Overall Cognitive Status: History of cognitive impairments - at baseline  General Comments      Exercises     Assessment/Plan    PT Assessment Patient needs continued PT services  PT Problem List Decreased mobility;Decreased activity tolerance;Decreased balance;Decreased safety awareness;Decreased cognition       PT Treatment Interventions DME instruction;Gait training;Functional mobility training;Therapeutic activities;Patient/family education;Balance training;Therapeutic exercise    PT  Goals (Current goals can be found in the Care Plan section)  Acute Rehab PT Goals Patient Stated Goal: none stated PT Goal Formulation: With patient Time For Goal Achievement: 05/14/17 Potential to Achieve Goals: Good    Frequency Min 3X/week   Barriers to discharge        Co-evaluation               AM-PAC PT "6 Clicks" Daily Activity  Outcome Measure Difficulty turning over in bed (including adjusting bedclothes, sheets and blankets)?: None Difficulty moving from lying on back to sitting on the side of the bed? : Unable Difficulty sitting down on and standing up from a chair with arms (e.g., wheelchair, bedside commode, etc,.)?: A Little Help needed moving to and from a bed to chair (including a wheelchair)?: A Little Help needed walking in hospital room?: A Little Help needed climbing 3-5 steps with a railing? : A Little 6 Click Score: 17    End of Session Equipment Utilized During Treatment: Gait belt Activity Tolerance: Patient tolerated treatment well Patient left: in chair;with call bell/phone within reach;with chair alarm set   PT Visit Diagnosis: Muscle weakness (generalized) (M62.81);Difficulty in walking, not elsewhere classified (R26.2)    Time: 1157-2620 PT Time Calculation (min) (ACUTE ONLY): 15 min   Charges:   PT Evaluation $PT Eval Moderate Complexity: 1 Mod     PT G Codes:          Weston Anna, MPT Pager: [phone]

## 2017-04-30 NOTE — Care Management Obs Status (Signed)
[place] NOTIFICATION   Patient Details  Name: Carla Conway MRN: [id number] Date of Birth: 1926-12-19   Medicare Observation Status Notification Given:  Yes    Leeroy Cha, RN 04/30/2017, 10:57 AM

## 2017-04-30 NOTE — Progress Notes (Signed)
[facility]  PROGRESS NOTE  [facility] JTT:[id number] DOB: 05/03/1926 DOA: 04/29/2017 PCP: Lujean Amel, MD   Brief HPI:    84y/o woman with PMH significant for CAD, HTN, HLD, CLL (essentially on remission), mild dementia, depression and GERD; who presented to ED after waking up this morning with new sudden onset of left upper extremity weakness, with associated generalized debility and just no feeling right. Patient went to bed w/o any complaints and remember waking up to use the bathroom and not having any symptoms initially. After further questioning, she reported some ongoing weakness since she started using HCTZ for BP control, also reported decrease appetite lately     Subjective   Patient seen and examined, denies weakness of upper extremities. Complains of left shoulder pain.   Assessment/Plan:     1. Left upper extremity weakness- resolved, CT head and MRI negative for acute abnormality. Patient was hypokalemic so HCTZ was discontinued. Magnesium 1.7, phosphorus 3.5, hemoglobin A1c 5.9, lipid profile showed LDL 54, CK 56, total cholesterol 102. Echo and carotid Dopplers ordered. Neurology was consulted, and recommended TIA workup. Physical therapy recommended skilled nursing facility 2. Hyperlipidemia- lipid profile is within normal range, lovastatin currently on hold due to generalized weakness 3. Hypokalemia-HCTZ/triamterene has been held, will replace potassium and check BMP in a.m. 4. Hypertension- blood pressure is now elevated, will change the dose of Lopressor to 25 mg twice a day, restart Maxzide 25 at half the dose. 5. CLL- stable, followed by oncology as outpatient. 6. COPD- stable, no exacerbation. Continue when necessary DuoNeb. 7. History of CAD- continue beta blocker, Imdur, aspirin, statin currently on hold    DVT prophylaxis: Heparin  Code Status: Full code  Family Communication: No family present at bedside   Disposition Plan: Skilled nursing  facility   Consultants:  None  Procedures:  Echo  Carotid Dopplers  Continuous infusions     Antibiotics:   Anti-infectives (From admission, onward)   None       Objective   Vitals:   04/30/17 0339 04/30/17 0539 04/30/17 0814 04/30/17 0939  BP: (!) 150/54  (!) 156/54 (!) 162/80  Pulse: 69  67 69  Resp: 19  (!) 24   Temp: (!) 97.5 F (36.4 C)  97.8 F (36.6 C)   TempSrc: Oral  Oral   SpO2: 92%  93%   Weight:  65.9 kg (145 lb 4.5 oz)    Height:        Intake/Output Summary (Last 24 hours) at 04/30/2017 1302 Last data filed at 04/30/2017 0900 Gross per 24 hour  Intake 860 ml  Output -  Net 860 ml   Filed Weights   04/29/17 1053 04/29/17 2124 04/30/17 0539  Weight: 66.7 kg (147 lb) 66 kg (145 lb 8.1 oz) 65.9 kg (145 lb 4.5 oz)     Physical Examination:   Physical Exam: Eyes: No icterus, extraocular muscles intact  Mouth: Oral mucosa is moist, no lesions on palate,  Neck: Supple, no deformities, masses, or tenderness Lungs: Normal respiratory effort, bilateral clear to auscultation, no crackles or wheezes.  Heart: Regular rate and rhythm, S1 and S2 normal, no murmurs, rubs auscultated Abdomen: BS normoactive,soft,nondistended,non-tender to palpation,no organomegaly Extremities: No pretibial edema, no erythema, no cyanosis, no clubbing Neuro : Alert and oriented to time, place and person, No focal deficits      Data Reviewed: I have personally reviewed following labs and imaging studies  CBG: No results for input(s): GLUCAP in the last 168 hours.  CBC: Recent Labs  Lab 04/28/17 1238 04/29/17 1100 04/29/17 1143  WBC 12.5* 10.1  --   NEUTROABS 0.9* 0.6*  --   HGB 11.9 11.4* 11.9*  HCT 36.1 34.6* 35.0*  MCV 89.6 88.3  --   PLT 156 157  --     Basic Metabolic Panel: Recent Labs  Lab 04/28/17 1238 04/29/17 1100 04/29/17 1143 04/29/17 2130 04/30/17 0404  NA 134* 135 136  --  136  K 4.2 3.4* 3.4*  --  3.1*  CL 98 98* 95*  --  104   CO2 29 28  --   --  26  GLUCOSE 87 101* 99  --  93  BUN 15 14 12   --  13  CREATININE 0.98 0.88 0.90  --  0.85  CALCIUM 9.3 9.1  --   --  8.6*  MG  --   --   --  1.7  --   PHOS  --   --   --  3.5  --     No results found for this or any previous visit (from the past 240 hour(s)).   Liver Function Tests: Recent Labs  Lab 04/28/17 1238 04/29/17 1100  AST 16 19  ALT 12 12*  ALKPHOS 69 64  BILITOT 0.4 0.4  PROT 6.2* 6.3*  ALBUMIN 3.8 3.7   No results for input(s): LIPASE, AMYLASE in the last 168 hours. No results for input(s): AMMONIA in the last 168 hours.  Cardiac Enzymes: Recent Labs  Lab 04/29/17 1100  CKTOTAL 56   BNP (last 3 results) No results for input(s): BNP in the last 8760 hours.  ProBNP (last 3 results) No results for input(s): PROBNP in the last 8760 hours.    Studies: Mr Brain 42 Contrast  Result Date: 04/29/2017 CLINICAL DATA:  Increasing weakness and dizziness with left arm and shoulder pain since this morning. Confusion. EXAM: MRI HEAD WITHOUT CONTRAST TECHNIQUE: Multiplanar, multiecho pulse sequences of the brain and surrounding structures were obtained without intravenous contrast. COMPARISON:  Head CT earlier today FINDINGS: Brain: No acute infarction, hemorrhage, hydrocephalus, extra-axial collection or mass lesion. Chronic small-vessel disease with confluent gliosis in the cerebral white matter and patchy across the pons. Age congruent cerebral volume. Vascular: Major flow voids are preserved. Skull and upper cervical spine: Negative for marrow lesion. Sinuses/Orbits: Bilateral cataract resection. Other: Best seen on sagittal T1 weighted imaging is enlarged homogeneous nodes in the bilateral upper neck. Patient has history of CLL. Progressively motion degraded study, best obtainable due to confusion. IMPRESSION: 1. No acute finding, including infarct. 2. Moderate to advanced chronic small vessel ischemia seen in the cerebral white matter and pons. 3.  Enlarged upper cervical lymph nodes correlating with history of CLL. 4. Moderately motion degraded exam. Electronically Signed   By: Monte Fantasia M.D.   On: 04/29/2017 15:28   Ct Head Code Stroke Wo Contrast  Result Date: 04/29/2017 CLINICAL DATA:  Code stroke. Generalized weakness, particularly in the left leg. EXAM: CT HEAD WITHOUT CONTRAST TECHNIQUE: Contiguous axial images were obtained from the base of the skull through the vertex without intravenous contrast. COMPARISON:  03/31/2017 FINDINGS: Brain: There is no evidence of acute cortically based infarct, intracranial hemorrhage, mass, midline shift, or extra-axial fluid collection. Patchy hypoattenuation in the left caudate and lentiform nuclei is stable to minimally more prominent than on the prior study and likely reflects chronic lacunar infarcts. Patchy to confluent hypodensities in the cerebral white matter bilaterally are similar to the prior study  and nonspecific but compatible with moderate chronic small vessel ischemic disease. A dilated perivascular space is noted in the inferior right basal ganglia. Mild generalized cerebral atrophy is not greater than expected for age. Vascular: Calcified atherosclerosis at the skull base. No hyperdense vessel. Skull: No fracture or focal osseous lesion. Sinuses/Orbits: Partially visualized postsurgical changes in the paranasal sinuses. Clear mastoid air cells. Bilateral cataract extraction. Other: None. ASPECTS [facility] Stroke Program Early CT Score) - Ganglionic level infarction (caudate, lentiform nuclei, internal capsule, insula, M1-M3 cortex): 7 - Supraganglionic infarction (M4-M6 cortex): 3 Total score (0-10 with 10 being normal): 10 IMPRESSION: 1. No evidence of acute intracranial abnormality. 2. ASPECTS is 10. 3. Moderate chronic small vessel ischemic disease. These results were called by telephone at the time of interpretation on 04/29/2017 at 11:29 am to Dr. Julianne Rice , who verbally  acknowledged these results. Electronically Signed   By: Logan Bores M.D.   On: 04/29/2017 11:30    Scheduled Meds: . amLODipine  5 mg Oral Daily  . aspirin EC  81 mg Oral Daily  . heparin  5,000 Units Subcutaneous Q8H  . iopamidol      . isosorbide mononitrate  30 mg Oral Daily  . metoprolol tartrate  25 mg Oral BID  . pantoprazole  40 mg Oral Daily  . potassium chloride  40 mEq Oral Q4H  . QUEtiapine  12.5 mg Oral QHS  . sertraline  25 mg Oral Daily      Time spent: 25 min  [facility] Pager [phone]. If 7PM-7AM, please contact night-coverage at [email or web address], Office  [phone]  password [email or web address]  04/30/2017, 1:02 PM  LOS: 0 days

## 2017-04-30 NOTE — Evaluation (Signed)
Occupational Therapy Evaluation Patient Details Name: Carla Conway MRN: [id number] DOB: 09/25/1926 Today's Date: 04/30/2017    History of Present Illness 84 yo female admitted with dizziness, weakness. Hx of CLL, dementia, COPD, CKD, syncopal episodes   Clinical Impression   Pt was admitted for the above.  Pt's granddaughter present at time of evaluation and states that pt is much better than yesterday. She was active with [facility] therapies.  Pt needs mostly set up/min guard for ambulating at this time. Will follow in acute with supervision to mod I level goals.      Follow Up Recommendations  Home health OT    Equipment Recommendations  None recommended by OT    Recommendations for Other Services       Precautions / Restrictions Precautions Precautions: Fall Restrictions Weight Bearing Restrictions: No      Mobility Bed Mobility Overal bed mobility: Needs Assistance Bed Mobility: Supine to Sit     Supine to sit: HOB elevated;Min assist     General bed mobility comments: oob  Transfers Overall transfer level: Needs assistance Equipment used: Rolling walker (2 wheeled) Transfers: Sit to/from Stand Sit to Stand: Min guard         General transfer comment: for safety; stabilized RW; pt is used to a rollator    Balance Overall balance assessment: Needs assistance         Standing balance support: Bilateral upper extremity supported Standing balance-Leahy Scale: Poor                             ADL either performed or assessed with clinical judgement   ADL Overall ADL's : Needs assistance/impaired Eating/Feeding: Independent   Grooming: Brushing hair;Set up;Supervision/safety;Standing   Upper Body Bathing: Set up;Sitting   Lower Body Bathing: Supervison/ safety;Sit to/from stand   Upper Body Dressing : Set up;Sitting   Lower Body Dressing: Supervision/safety;Sit to/from stand Lower Body Dressing Details (indicate cue type and reason): pt had  very tight slipper socks. She was able to hook these over her toes but not pull up completely. She should be independent with looser socks/shoes Toilet Transfer: Magazine features editor Details (indicate cue type and reason): for safety Toileting- Clothing Manipulation and Hygiene: Supervision/safety;Sit to/from stand         General ADL Comments: pt is used to a rollator.  Min guard to stabilize RW when standing up     Vision         Perception     Praxis      Pertinent Vitals/Pain Pain Assessment: No/denies pain     Hand Dominance     Extremity/Trunk Assessment Upper Extremity Assessment Upper Extremity Assessment: Generalized weakness(c/o L shoulder pain; can lift to 90 without pain)      Cervical / Trunk Assessment Cervical / Trunk Assessment: Normal   Communication Communication Communication: HOH   Cognition Arousal/Alertness: Awake/alert Behavior During Therapy: WFL for tasks assessed/performed Overall Cognitive Status: History of cognitive impairments - at baseline                                     General Comments       Exercises     Shoulder Instructions      Home Living Family/patient expects to be discharged to:: Private residence Living Arrangements: Alone   Type of Home: Apartment Home Access: Level entry  Home Layout: One level     Bathroom Shower/Tub: Occupational psychologist: Standard     Home Equipment: Grab bars - toilet;Grab bars - tub/shower          Prior Functioning/Environment Level of Independence: Independent with assistive device(s)  Gait / Transfers Assistance Needed: rollator for ambulation ADL's / Homemaking Assistance Needed: Mod Ind with bathing, dressing   Comments: pt/family report pt living independently, using rollator and performing all ADLs independently.  Was active with home health therapies        OT Problem List: Decreased strength;Decreased activity  tolerance;Impaired balance (sitting and/or standing);Pain;Decreased cognition      OT Treatment/Interventions: Self-care/ADL training;DME and/or AE instruction;Patient/family education    OT Goals(Current goals can be found in the care plan section) Acute Rehab OT Goals Patient Stated Goal: none stated OT Goal Formulation: With patient/family Time For Goal Achievement: 05/14/17 Potential to Achieve Goals: Good ADL Goals Pt Will Transfer to Toilet: with modified independence;ambulating;regular height toilet;grab bars Additional ADL Goal #1: pt will gather clothes at supervison level and complete ADL without supervision  OT Frequency: Min 2X/week   Barriers to D/C:            Co-evaluation              AM-PAC PT "6 Clicks" Daily Activity     Outcome Measure Help from another person eating meals?: None Help from another person taking care of personal grooming?: A Little Help from another person toileting, which includes using toliet, bedpan, or urinal?: A Little Help from another person bathing (including washing, rinsing, drying)?: A Little Help from another person to put on and taking off regular upper body clothing?: A Little Help from another person to put on and taking off regular lower body clothing?: A Little 6 Click Score: 19   End of Session    Activity Tolerance: Patient tolerated treatment well Patient left: in chair;with call bell/phone within reach;with chair alarm set;with family/visitor present  OT Visit Diagnosis: Muscle weakness (generalized) (M62.81)                Time: 5974-1638 OT Time Calculation (min): 20 min Charges:  OT General Charges $OT Visit: 1 Visit OT Evaluation $OT Eval Low Complexity: 1 Low G-Codes:     Carla Conway, OTR/L [phone] 04/30/2017  Carla Conway 04/30/2017, 12:55 PM

## 2017-04-30 NOTE — Progress Notes (Signed)
Carotid artery duplex has been completed. 1-39% ICA stenosis bilaterally.  04/30/17 10:34 AM Carla Conway RVT

## 2017-04-30 NOTE — Progress Notes (Signed)
Carla Conway   DOB:09-Aug-1926   DQ#:[id number]      CLL (chronic lymphocytic leukemia) ([place])   05/02/2013 Initial Diagnosis    Carla Conway was found to have abnormal CBC from at least since 2015. According to the records from her primary care doctor that were faxed to the [facility], Carla Conway had leukocytosis at least since January 2015. On 05/02/2013, total white blood cell count was 28.3, normal hemoglobin 13.4 and platelet count 239,000. Carla Conway had predominantly Lymphocytosis. On 06/20/2013, a CBC show white count of 26.7, hemoglobin 12.9 and platelet count of 299,000 On admission, on 05/08/2016, white blood cell count was 38.4, hemoglobin 12.1 and platelet count of 210. Differential show predominantly lymphocytosis.      05/08/2016 Imaging    CT scan of chest 1. Negative for acute pulmonary embolism 2. Numerous pathologic nodes in the mediastinum, hila, axillae, supraclavicular regions and upper abdomen. The adenopathy is suspicious for malignancy such as lymphoma or CLL. If tissue diagnosis is desired, many of the supraclavicular and axillary nodes should be palpable and accessible to excisional biopsy. 3. Low-attenuation liver lesions, incompletely imaged and not characterized but more likely benign. 4. Small hiatal hernia.      09/22/2016 Pathology Results    Peripheral Blood Flow Cytometry - CHRONIC LYMPHOCYTIC LEUKEMIA. - SEE COMMENT. Diagnosis Comment: There is a population of monoclonal B-cells with expression of CD5 and CD23. The phenotype is consistent with chronic lymphocytic leukemia      09/22/2016 Pathology Results    FISH confirmed trisomy 12      10/21/2016 PET scan    1. Extensive adenopathy in the neck, chest, abdomen, and pelvis as detailed above, primarily Deauville 3 and Deauville 4. Mild splenomegaly. 2. High activity in some soft tissue prominence along the anal region, possibly physiologic or due to hemorrhoidal activity or urinary incontinence, but correlation with anorectal  exam recommended. 3. Focal abnormal accentuated high activity in the sigmoid colon along a region of abnormal stranding and diverticulosis, suspicious for active diverticulitis. Correlation with the patient's colon cancer screening history is recommended. If screening is not up-to-date, appropriate screening should be considered. 4. Aortic Atherosclerosis (ICD10-I70.0). Coronary atherosclerosis. 5. An exophytic high density lesion from the right kidney upper pole does not appear to be hypermetabolic and accordingly is most likely to be a benign complex cyst, but this lesion is not fully characterized on today' s exam. If the patient has hematuria or if otherwise clinically warranted, renal protocol MRI or CT examination could be utilized. 6. Some of the smaller hepatic lesions are nonspecific, but the larger hepatic lesions are photopenic and likely cysts.      10/23/2016 Procedure    Placement of single lumen port a cath via right internal jugular vein. The catheter tip lies at the cavo-atrial junction. A power injectable port a cath was placed and is ready for immediate use      10/29/2016 - 10/30/2016 Hospital Admission    Carla Conway was admitted after allergic reaction to Rituximab      10/29/2016 Adverse Reaction    Carla Conway received Rituximab complicated by allergic reaction. Treatment was abandoned      11/20/2016 - 04/28/2017 Chemotherapy    The patient is started on chlorambucil and low dose prednisone       Subjective: I was informed that the patient was admitted to the hospital due to weakness.  I just saw her several days ago with plan for outpatient PET CT scan.  I spoke with her daughter over  the telephone who confirmed the weakness. This morning, Carla Conway felt better Weakness is slightly improved and her left shoulder pain has improved. Her abdominal pain is better but Carla Conway has not been eating for several days.  Objective:  Vitals:   04/30/17 0339 04/30/17 0814  BP: (!) 150/54 (!) 156/54   Pulse: 69 67  Resp: 19 (!) 24  Temp: (!) 97.5 F (36.4 C) 97.8 F (36.6 C)  SpO2: 92% 93%     Intake/Output Summary (Last 24 hours) at 04/30/2017 0910 Last data filed at 04/30/2017 0600 Gross per 24 hour  Intake 860 ml  Output -  Net 860 ml    GENERAL:alert, no distress and comfortable SKIN: skin color, texture, turgor are normal, no rashes or significant lesions EYES: normal, Conjunctiva are pink and non-injected, sclera clear OROPHARYNX:no exudate, no erythema and lips, buccal mucosa, and tongue normal  NECK: supple, thyroid normal size, non-tender, without nodularity LYMPH:  no palpable lymphadenopathy in the cervical, axillary or inguinal LUNGS: clear to auscultation and percussion with normal breathing effort HEART: regular rate & rhythm and no murmurs and no lower extremity edema ABDOMEN:abdomen soft, non-tender and normal bowel sounds Musculoskeletal:no cyanosis of digits and no clubbing  NEURO: alert & oriented x 3 with fluent speech, no focal motor/sensory deficits.  Carla Conway is hard of hearing   Labs:  Lab Results  Component Value Date   WBC 10.1 04/29/2017   HGB 11.9 (L) 04/29/2017   HCT 35.0 (L) 04/29/2017   MCV 88.3 04/29/2017   PLT 157 04/29/2017   NEUTROABS 0.6 (L) 04/29/2017    Lab Results  Component Value Date   NA 136 04/30/2017   K 3.1 (L) 04/30/2017   CL 104 04/30/2017   CO2 26 04/30/2017    Studies:  Mr Brain Wo Contrast  Result Date: 04/29/2017 CLINICAL DATA:  Increasing weakness and dizziness with left arm and shoulder pain since this morning. Confusion. EXAM: MRI HEAD WITHOUT CONTRAST TECHNIQUE: Multiplanar, multiecho pulse sequences of the brain and surrounding structures were obtained without intravenous contrast. COMPARISON:  Head CT earlier today FINDINGS: Brain: No acute infarction, hemorrhage, hydrocephalus, extra-axial collection or mass lesion. Chronic small-vessel disease with confluent gliosis in the cerebral white matter and patchy  across the pons. Age congruent cerebral volume. Vascular: Major flow voids are preserved. Skull and upper cervical spine: Negative for marrow lesion. Sinuses/Orbits: Bilateral cataract resection. Other: Best seen on sagittal T1 weighted imaging is enlarged homogeneous nodes in the bilateral upper neck. Patient has history of CLL. Progressively motion degraded study, best obtainable due to confusion. IMPRESSION: 1. No acute finding, including infarct. 2. Moderate to advanced chronic small vessel ischemia seen in the cerebral white matter and pons. 3. Enlarged upper cervical lymph nodes correlating with history of CLL. 4. Moderately motion degraded exam. Electronically Signed   By: Monte Fantasia M.D.   On: 04/29/2017 15:28   Ct Head Code Stroke Wo Contrast  Result Date: 04/29/2017 CLINICAL DATA:  Code stroke. Generalized weakness, particularly in the left leg. EXAM: CT HEAD WITHOUT CONTRAST TECHNIQUE: Contiguous axial images were obtained from the base of the skull through the vertex without intravenous contrast. COMPARISON:  03/31/2017 FINDINGS: Brain: There is no evidence of acute cortically based infarct, intracranial hemorrhage, mass, midline shift, or extra-axial fluid collection. Patchy hypoattenuation in the left caudate and lentiform nuclei is stable to minimally more prominent than on the prior study and likely reflects chronic lacunar infarcts. Patchy to confluent hypodensities in the cerebral white matter bilaterally are  similar to the prior study and nonspecific but compatible with moderate chronic small vessel ischemic disease. A dilated perivascular space is noted in the inferior right basal ganglia. Mild generalized cerebral atrophy is not greater than expected for age. Vascular: Calcified atherosclerosis at the skull base. No hyperdense vessel. Skull: No fracture or focal osseous lesion. Sinuses/Orbits: Partially visualized postsurgical changes in the paranasal sinuses. Clear mastoid air cells.  Bilateral cataract extraction. Other: None. ASPECTS [facility] Stroke Program Early CT Score) - Ganglionic level infarction (caudate, lentiform nuclei, internal capsule, insula, M1-M3 cortex): 7 - Supraganglionic infarction (M4-M6 cortex): 3 Total score (0-10 with 10 being normal): 10 IMPRESSION: 1. No evidence of acute intracranial abnormality. 2. ASPECTS is 10. 3. Moderate chronic small vessel ischemic disease. These results were called by telephone at the time of interpretation on 04/29/2017 at 11:29 am to Dr. Julianne Rice , who verbally acknowledged these results. Electronically Signed   By: Logan Bores M.D.   On: 04/29/2017 11:30    Assessment & Plan:   CLL (chronic lymphocytic leukemia) ([facility]) The patient is very frail Carla Conway was recently hospitalized after a fall, now with repeated hospitalization for weakness The patient has severe intermittent abdominal pain of unknown etiology The pattern of pain and discomfort is not consistent with gastritis; malignant transformation of CLL cannot be excluded It could be due to residual disease Clinically, lymphocytosis has improved dramatically My original plan was to restage with PET/CT scan.  Given her frail status, and after much discussion with her daughter, Carla Conway is in agreement to proceed with CT scan of the chest, abdomen and pelvis with contrast We will cancel outpatient PET CT scan I would return to discuss test result with the patient and family  Abdominal pain Carla Conway has chronic intermittent periumbilical pain that could be related to residual disease I plan to repeat imaging study for further assessment I have recently discontinue her treatment  Weakness, possible TIA Will defer to primary service for workup  Severe uncontrolled hypertension Carla Conway is frail and sensitive to low-dose blood pressure medications Continue close monitoring  Discharge planning Unlikely to be discharged home today I will return to check on the patient and review  test results with her and family Please call if questions arise    Heath Lark, MD 04/30/2017  9:10 AM

## 2017-04-30 NOTE — CV Procedure (Signed)
2D echo attempted but pt in [place] per RN. Will have to try later

## 2017-05-01 ENCOUNTER — Observation Stay (HOSPITAL_BASED_OUTPATIENT_CLINIC_OR_DEPARTMENT_OTHER): Payer: Medicare Other

## 2017-05-01 DIAGNOSIS — R109 Unspecified abdominal pain: Secondary | ICD-10-CM | POA: Diagnosis not present

## 2017-05-01 DIAGNOSIS — I10 Essential (primary) hypertension: Secondary | ICD-10-CM | POA: Diagnosis not present

## 2017-05-01 DIAGNOSIS — E876 Hypokalemia: Secondary | ICD-10-CM | POA: Diagnosis not present

## 2017-05-01 DIAGNOSIS — R531 Weakness: Secondary | ICD-10-CM | POA: Diagnosis not present

## 2017-05-01 DIAGNOSIS — C9190 Lymphoid leukemia, unspecified not having achieved remission: Secondary | ICD-10-CM | POA: Diagnosis not present

## 2017-05-01 DIAGNOSIS — C9110 Chronic lymphocytic leukemia of B-cell type not having achieved remission: Secondary | ICD-10-CM | POA: Diagnosis not present

## 2017-05-01 LAB — BASIC METABOLIC PANEL
ANION GAP: 7 (ref 5–15)
BUN: 13 mg/dL (ref 6–20)
CALCIUM: 8.9 mg/dL (ref 8.9–10.3)
CO2: 27 mmol/L (ref 22–32)
CREATININE: 0.91 mg/dL (ref 0.44–1.00)
Chloride: 102 mmol/L (ref 101–111)
GFR, EST NON AFRICAN AMERICAN: 55 mL/min — AB (ref >60)
Glucose, Bld: 95 mg/dL (ref 65–99)
Potassium: 3.9 mmol/L (ref 3.5–5.1)
SODIUM: 136 mmol/L (ref 135–145)

## 2017-05-01 LAB — ECHOCARDIOGRAM COMPLETE
HEIGHTINCHES: 66 in
Weight: 2324.53 oz

## 2017-05-01 MED ORDER — METOPROLOL TARTRATE 25 MG PO TABS: 25.0000 mg | ORAL_TABLET | Freq: Two times a day (BID) | ORAL | 2 refills | Status: DC

## 2017-05-01 MED ORDER — TRIAMTERENE-HCTZ 37.5-25 MG PO TABS: 0.5000 | ORAL_TABLET | Freq: Every day | ORAL | 2 refills | Status: DC

## 2017-05-01 MED ORDER — ASPIRIN 81 MG PO TBEC: 81.0000 mg | DELAYED_RELEASE_TABLET | Freq: Every day | ORAL | 2 refills | Status: AC

## 2017-05-01 NOTE — NC FL2 (Signed)
[place] LEVEL OF CARE SCREENING TOOL     IDENTIFICATION  Patient Name: Carla Conway Birthdate: September 18, 1926 Sex: female Admission Date (Current Location): 04/29/2017  [facility] and [place] Number:  Herbalist and Address:  [facility],  [place] [address], [place]      Provider Number: [id number]  Attending Physician Name and Address:  Oswald Hillock, MD  Relative Name and Phone Number:       Current Level of Care: Hospital Recommended Level of Care: [place] Prior Approval Number:    Date Approved/Denied:   PASRR Number: [id number]  Discharge Plan: SNF    Current Diagnoses: Patient Active Problem List   Diagnosis Date Noted  . Hypokalemia 04/29/2017  . Left arm pain 04/29/2017  . TIA (transient ischemic attack) 04/29/2017  . Abdominal pain 04/28/2017  . Elevated CK 04/01/2017  . AKI (acute kidney injury) ([place]) 03/31/2017  . Dehydration 03/31/2017  . Generalized weakness 03/31/2017  . External hemorrhoids without complication 66/09/3014  . Port-A-Cath in place 01/06/2017  . Gastritis 01/06/2017  . Poor memory 12/11/2016  . Dysuria 11/13/2016  . Anaphylaxis 10/29/2016  . Anaphylactoid reaction 10/29/2016  . Goals of care, counseling/discussion 10/28/2016  . Chronic kidney disease, stage III (moderate) ([place]) 10/17/2016  . Lymphoma, small lymphocytic ([place]) 10/15/2016  . Neutropenia ([place]) 09/23/2016  . CLL (chronic lymphocytic leukemia) ([place]) 05/11/2016  . Acute on chronic diastolic heart failure ([place]) 05/10/2016  . Influenza A 05/09/2016  . Lymphadenopathy of head and neck region 05/09/2016  . Lymphadenopathy, thoracic 05/09/2016  . COPD (chronic obstructive pulmonary disease) ([place]) 05/09/2016  . Debility 05/09/2016  . Syncopal episodes 05/09/2016  . Syncope 05/08/2016  . HTN (hypertension) 05/18/2013  . CAD in native artery 05/18/2013    Orientation RESPIRATION BLADDER Height & Weight     Self,  Time, Situation, Place  Normal Continent Weight: 145 lb 4.5 oz (65.9 kg) Height:  5' 6"  (167.6 cm)  BEHAVIORAL SYMPTOMS/MOOD NEUROLOGICAL BOWEL NUTRITION STATUS        Diet(Heart)  AMBULATORY STATUS COMMUNICATION OF NEEDS Skin   Limited Assist Verbally Normal                       Personal Care Assistance Level of Assistance  Bathing, Feeding, Dressing Bathing Assistance: Limited assistance Feeding assistance: Independent Dressing Assistance: Limited assistance     Functional Limitations Info  Sight, Hearing, Speech Sight Info: Adequate Hearing Info: Impaired Speech Info: Adequate    SPECIAL CARE FACTORS FREQUENCY  PT (By licensed PT), OT (By licensed OT)     PT Frequency: 5x/week OT Frequency: 5x/week            Contractures Contractures Info: Not present    Additional Factors Info  Code Status, Allergies Code Status Info: Full Code Allergies Info: Rituximab           Current Medications (05/01/2017):  This is the current hospital active medication list Current Facility-Administered Medications  Medication Dose Route Frequency Provider Last Rate Last Dose  . acetaminophen (TYLENOL) tablet 650 mg  650 mg Oral Q6H PRN Barton Dubois, MD      . amLODipine (NORVASC) tablet 5 mg  5 mg Oral Daily Barton Dubois, MD   5 mg at 05/01/17 0956  . aspirin EC tablet 81 mg  81 mg Oral Daily Barton Dubois, MD   81 mg at 05/01/17 0955  . heparin injection 5,000 Units  5,000 Units Subcutaneous Q8H  Barton Dubois, MD   5,000 Units at 05/01/17 [phone]  . iopamidol (ISOVUE-300) 61 % injection 15 mL  15 mL Oral Once PRN Oswald Hillock, MD      . isosorbide mononitrate (IMDUR) 24 hr tablet 30 mg  30 mg Oral Daily Barton Dubois, MD   30 mg at 05/01/17 0955  . magnesium oxide (MAG-OX) tablet 400 mg  400 mg Oral BID Oswald Hillock, MD   400 mg at 05/01/17 0956  . metoprolol tartrate (LOPRESSOR) tablet 25 mg  25 mg Oral BID Barton Dubois, MD   25 mg at 05/01/17 0956  . ondansetron  (ZOFRAN) tablet 4 mg  4 mg Oral Q6H PRN Barton Dubois, MD       Or  . ondansetron [facility]) injection 4 mg  4 mg Intravenous Q6H PRN Barton Dubois, MD      . pantoprazole (PROTONIX) EC tablet 40 mg  40 mg Oral Daily Barton Dubois, MD   40 mg at 05/01/17 0956  . potassium chloride SA (K-DUR,KLOR-CON) CR tablet 20 mEq  20 mEq Oral Daily Oswald Hillock, MD   20 mEq at 05/01/17 0955  . QUEtiapine (SEROQUEL) tablet 12.5 mg  12.5 mg Oral QHS Barton Dubois, MD   12.5 mg at 04/30/17 2127  . sertraline (ZOLOFT) tablet 25 mg  25 mg Oral Daily Barton Dubois, MD   25 mg at 05/01/17 0955  . triamterene-hydrochlorothiazide (MAXZIDE-25) 37.5-25 MG per tablet 0.5 tablet  0.5 tablet Oral Daily Oswald Hillock, MD   0.5 tablet at 05/01/17 1518     Discharge Medications: Please see discharge summary for a list of discharge medications.  Relevant Imaging Results:  Relevant Lab Results:   Additional Information ssn:[id number]  Burnis Medin, LCSW

## 2017-05-01 NOTE — Discharge Summary (Signed)
Physician Discharge Summary  Carla Conway Pra OIT:[id number] DOB: Feb 05, 1927 DOA: 04/29/2017  PCP: Lujean Amel, MD  Admit date: 04/29/2017 Discharge date: 05/01/2017  Time spent: 35 minutes  Recommendations for Outpatient Follow-up:  1. Follow up PCP in 2 weeks  Discharge Diagnoses:  Active Problems:   HTN (hypertension)   COPD (chronic obstructive pulmonary disease) (HCC)   CLL (chronic lymphocytic leukemia) (HCC)   Poor memory   Gastritis   Generalized weakness   Hypokalemia   Left arm pain   TIA (transient ischemic attack)   Discharge Condition: Stable  Diet recommendation: heart healthy diet  Filed Weights   04/29/17 1053 04/29/17 2124 04/30/17 0539  Weight: 66.7 kg (147 lb) 66 kg (145 lb 8.1 oz) 65.9 kg (145 lb 4.5 oz)    History of present illness:  84y/o woman with PMH significant for CAD, HTN, HLD, CLL (essentially on remission), mild dementia, depression and GERD; who presented to ED after waking up this morning with new sudden onset of left upper extremity weakness, with associated generalized debility and just no feeling right. Patient went to bed w/o any complaints and remember waking up to use the bathroom and not having any symptoms initially. After further questioning, she reported some ongoing weakness since she started using HCTZ for BP control, also reported decrease appetite lately    Hospital Course:     1. Left upper extremity weakness- resolved, CT head and MRI negative for acute abnormality. Patient was hypokalemic so HCTZ was discontinued. Magnesium 1.7, phosphorus 3.5, hemoglobin A1c 5.9, lipid profile showed LDL 54, CK 56, total cholesterol 102. Echo and carotid Dopplers are unremarkable. Neurology was consulted, and recommended TIA workup. Physical therapy recommended skilled nursing facility, but insurance would not cover for her stay at rehab. She will be discharged home with HHPT 2. Hyperlipidemia- lipid profile is within normal range, continue   Lovastatin. 3. Hypokalemia- replete. 4. Hypertension- blood pressure is now elevated, will change the dose of Lopressor to 25 mg twice a day, restart Maxzide 25 at half the dose. 5. CLL- stable, followed by oncology as outpatient. Oncology to follow as outpatient to discuss treatment options. 6. COPD- stable, no exacerbation. Continue when necessary DuoNeb. 7. History of CAD- continue beta blocker, Imdur, aspirin, statin    Procedures: Echo Systolic function was   normal. The estimated ejection fraction was in the range of 55%   to 60%. Wall motion was normal; there were no regional wall   motion abnormalities. There was an increased relative   contribution of atrial contraction to ventricular filling, which   may be seen with aging. - Aortic valve: Noncoronary cusp mobility was mildly restricted.   Sclerosis without stenosis. - Pericardium, extracardiac: A trivial pericardial effusion was    identified.   Carotid doppler-Carotid artery duplex has been completed. 1-39% ICA stenosis bilaterally.     Consultations:  Oncology   Discharge Exam: Vitals:   04/30/17 2059 05/01/17 0354  BP: (!) 146/89 (!) 151/56  Pulse: 85 64  Resp: 18 18  Temp: 97.8 F (36.6 C) 97.7 F (36.5 C)  SpO2: 100% 92%    General: Appear in no acute distress Cardiovascular: S1S2 RRR Respiratory: Clear bilaterally  Discharge Instructions   Discharge Instructions    Diet - low sodium heart healthy   Complete by:  As directed    Increase activity slowly   Complete by:  As directed      Allergies as of 05/01/2017      Reactions  Rituximab Other (See Comments)   "She coded"/Upset stomach and blood pressure dropped      Medication List    TAKE these medications   acetaminophen 500 MG tablet Commonly known as:  TYLENOL Take 650 mg by mouth daily as needed for mild pain or headache.   amLODipine 5 MG tablet Commonly known as:  NORVASC Take 1 tablet (5 mg total) by mouth daily.    aspirin 81 MG EC tablet Take 1 tablet (81 mg total) by mouth daily. Start taking on:  05/02/2017   COMBIVENT RESPIMAT 20-100 MCG/ACT Aers respimat Generic drug:  Ipratropium-Albuterol Take 2 puffs by mouth every 6 (six) hours as needed for wheezing or shortness of breath.   ENSURE PO Take 1 Can by mouth daily.   isosorbide mononitrate 30 MG 24 hr tablet Commonly known as:  IMDUR Take 30 mg by mouth daily.   LEUKERAN 2 MG tablet Generic drug:  chlorambucil TAKE 1 TABLET (2 MG TOTAL) BY MOUTH DAILY. GIVE ON AN EMPTY STOMACH 1 HOUR BEFORE OR 2 HOURS AFTER MEALS.   lidocaine-prilocaine cream Commonly known as:  EMLA Apply 1 application topically as needed. What changed:  reasons to take this   lovastatin 40 MG tablet Commonly known as:  MEVACOR Take 40 mg by mouth at bedtime.   metoprolol tartrate 25 MG tablet Commonly known as:  LOPRESSOR Take 1 tablet (25 mg total) by mouth 2 (two) times daily. What changed:  how much to take   omeprazole 20 MG capsule Commonly known as:  PRILOSEC Take 1 capsule (20 mg total) by mouth daily.   ondansetron 8 MG tablet Commonly known as:  ZOFRAN Take 1 tablet (8 mg total) by mouth every 8 (eight) hours as needed for nausea.   potassium chloride 10 MEQ tablet Commonly known as:  K-DUR Take 10 mEq by mouth daily.   predniSONE 5 MG tablet Commonly known as:  DELTASONE Take 1 tablet (5 mg total) by mouth daily with breakfast.   QUEtiapine 25 MG tablet Commonly known as:  SEROQUEL Take 12.5 mg by mouth at bedtime.   sertraline 50 MG tablet Commonly known as:  ZOLOFT Take 50 mg by mouth daily.   triamterene-hydrochlorothiazide 37.5-25 MG tablet Commonly known as:  MAXZIDE-25 Take 0.5 tablets by mouth daily. What changed:  when to take this      Allergies  Allergen Reactions  . Rituximab Other (See Comments)    "She coded"/Upset stomach and blood pressure dropped      The results of significant diagnostics from this  hospitalization (including imaging, microbiology, ancillary and laboratory) are listed below for reference.    Significant Diagnostic Studies: Ct Chest W Contrast  Result Date: 04/30/2017 CLINICAL DATA:  Chronic lymphocytic leukemia. New onset of left upper extremity weakness. EXAM: CT CHEST, ABDOMEN, AND PELVIS WITH CONTRAST TECHNIQUE: Multidetector CT imaging of the chest, abdomen and pelvis was performed following the standard protocol during bolus administration of intravenous contrast. CONTRAST:  125m ISOVUE-300 IOPAMIDOL (ISOVUE-300) INJECTION 61% COMPARISON:  Multiple exams, including PET-CT from 10/21/2016 FINDINGS: CT CHEST FINDINGS Cardiovascular: Right Port-A-Cath tip: SVC. Coronary, aortic arch, and branch vessel atherosclerotic vascular disease. Suspected coronary artery stents. Mediastinum/Nodes: Bilateral supraclavicular, axillary, subpectoral, paratracheal, prevascular, subcarinal, hilar, and infrahilar adenopathy noted. Index right supraclavicular node 1.1 cm in short axis on image 5/2, formerly 2.1 cm. The axillary adenopathy is likewise improved. However, a subcarinal lymph node measures 2.3 cm in short axis on image 25/2 (formerly 1.8 cm). Lower paratracheal node 1.3  cm on image 18/2, formerly the same. The hilar and infrahilar adenopathy is difficult to compare due to the lack of IV contrast on the prior exam but appears roughly similar subjectively. Lungs/Pleura: Airway thickening is present, suggesting bronchitis or reactive airways disease. There is some airway plugging in the right lower lobe and associated bandlike scarring in the right lower lobe. Mild scarring or atelectasis in the left lower lobe along the hemidiaphragm. Musculoskeletal: Transverse sclerosis in the sternal body compatible with healing fracture. This was not present on 05/08/2016 or on 10/21/2016. chronic 75% compression fracture at T4. CT ABDOMEN PELVIS FINDINGS Hepatobiliary: Scattered hypodense hepatic lesions,  the larger lesions are compatible with cysts and are not appreciably changed from 10/21/2016; the smaller lesions are technically nonspecific although statistically likely to also be small cysts. Gallbladder unremarkable. Pancreas: Unremarkable Spleen: The spleen measures 12.0 by 5.5 by 11.8 cm (volume = 410 cm^3). 1.5 by 1.2 cm hypodense lesion posteriorly in the spleen on image 54/2, a 0.6 cm hypodense lesion in the spleen on image 56, and a 1.0 by 0.6 cm lesion inferiorly in the spleen hypermetabolic activity in the spleen on the prior PET-CT. Adrenals/Urinary Tract: Right kidney upper pole 1.9 by 1.8 cm lesion on image 7/7 measures 45 Hounsfield units on portal venous phase images and 49 Hounsfield units on delayed phase images., on the prior PET-CT this was clearly hyperdense as well with an SUV of 37 Hounsfield units. This is small enough to where it is difficult to tell if the lesion was truly photopenic. No urinary tract calculi are identified. Stomach/Bowel: Extensive sigmoid diverticulosis. Descending colon diverticulosis. As on the prior exam, the matted mesenteric edema and nodal conglomerate above the sigmoid colon make it difficult to confidently exclude the possibility of diverticulitis. Vascular/Lymphatic: Aortoiliac atherosclerotic vascular disease. There is considerable peripancreatic, porta hepatis, celiac chain, periaortic, common iliac, external iliac, right inguinal, mesenteric, and perirectal adenopathy. Index left periaortic lymph node 1.6 cm in short axis on image 65/2, formerly 2.1 cm. Index right external iliac node 1.7 cm in short axis on image 100/2, formerly 1.8 cm. Index right inguinal lymph node 1.7 cm in short axis on image 108/2, formerly 1.5 cm. There has been an increase in confluent mesenteric infiltration of the sigmoid colon with associated nodularity, which could be inflammatory in the setting of diverticulitis, or due to confluent nodal disease with a more infiltrative  pattern. This was present previously, but is denser and more striking today for example on image 95/2. There is some mild omental nodularity in the right upper quadrant. Reproductive: Unremarkable Other: No supplemental non-categorized findings. Musculoskeletal: Notable lumbar spondylosis and degenerative disc disease. Mild degenerative grade 1 retrolisthesis at L1-and L2-3. Suspected impingement at all levels between L2 and S1. IMPRESSION: 1. Mixed appearance with regard to the extensive nodal disease in the chest, abdomen, and pelvis. Areas of improved adenopathy include the supraclavicular, axillary, and periaortic adenopathy. Areas of worsening include the subcarinal and right inguinal adenopathy. Some of the nodes are very similar to their size on prior PET-CT from 10/21/2016. 2. There is advanced sigmoid diverticulosis and just cephalad to this, there is a region of nodularity and mesenteric infiltration which has progressed compared to 10/21/2016. This may well represent an infiltrative component of malignancy rather than necessarily being due to chronic diverticulitis. No extraluminal gas is identified. No abscess noted. 3. Upper normal splenic size. There are several hypodense nonspecific splenic lesions. These were not readily appreciable on prior noncontrast images, but no associated  hypermetabolic activity was previously seen. 4. Hyperdense 1.9 cm right kidney upper pole lesion is not entirely specific. This is probably a complex cyst. Attention on follow up studies suggested. 5. Transverse sclerosis in the sternal body compatible with late phase healing fracture. This was not present on the prior PET-CT from 10/21/2016. Reviewing the patient's records, she apparently received CPR after an anaphylactic reaction to chemotherapy on or around 10/29/2016, and it is possible (although speculative) that this represents residua from sternal trauma during CPR. Correlate with any extended soreness in the chest  after that episode. 6. Other imaging findings of potential clinical significance: Aortic Atherosclerosis (ICD10-I70.0). Coronary atherosclerosis. Airway thickening is present, suggesting bronchitis or reactive airways disease. Scattered hepatic cysts. Multilevel lumbar impingement due to spondylosis and degenerative disc disease. Electronically Signed   By: Van Clines M.D.   On: 04/30/2017 16:05   Mr Brain Wo Contrast  Result Date: 04/29/2017 CLINICAL DATA:  Increasing weakness and dizziness with left arm and shoulder pain since this morning. Confusion. EXAM: MRI HEAD WITHOUT CONTRAST TECHNIQUE: Multiplanar, multiecho pulse sequences of the brain and surrounding structures were obtained without intravenous contrast. COMPARISON:  Head CT earlier today FINDINGS: Brain: No acute infarction, hemorrhage, hydrocephalus, extra-axial collection or mass lesion. Chronic small-vessel disease with confluent gliosis in the cerebral white matter and patchy across the pons. Age congruent cerebral volume. Vascular: Major flow voids are preserved. Skull and upper cervical spine: Negative for marrow lesion. Sinuses/Orbits: Bilateral cataract resection. Other: Best seen on sagittal T1 weighted imaging is enlarged homogeneous nodes in the bilateral upper neck. Patient has history of CLL. Progressively motion degraded study, best obtainable due to confusion. IMPRESSION: 1. No acute finding, including infarct. 2. Moderate to advanced chronic small vessel ischemia seen in the cerebral white matter and pons. 3. Enlarged upper cervical lymph nodes correlating with history of CLL. 4. Moderately motion degraded exam. Electronically Signed   By: Monte Fantasia M.D.   On: 04/29/2017 15:28   Ct Abdomen Pelvis W Contrast  Result Date: 04/30/2017 CLINICAL DATA:  Chronic lymphocytic leukemia. New onset of left upper extremity weakness. EXAM: CT CHEST, ABDOMEN, AND PELVIS WITH CONTRAST TECHNIQUE: Multidetector CT imaging of the  chest, abdomen and pelvis was performed following the standard protocol during bolus administration of intravenous contrast. CONTRAST:  170m ISOVUE-300 IOPAMIDOL (ISOVUE-300) INJECTION 61% COMPARISON:  Multiple exams, including PET-CT from 10/21/2016 FINDINGS: CT CHEST FINDINGS Cardiovascular: Right Port-A-Cath tip: SVC. Coronary, aortic arch, and branch vessel atherosclerotic vascular disease. Suspected coronary artery stents. Mediastinum/Nodes: Bilateral supraclavicular, axillary, subpectoral, paratracheal, prevascular, subcarinal, hilar, and infrahilar adenopathy noted. Index right supraclavicular node 1.1 cm in short axis on image 5/2, formerly 2.1 cm. The axillary adenopathy is likewise improved. However, a subcarinal lymph node measures 2.3 cm in short axis on image 25/2 (formerly 1.8 cm). Lower paratracheal node 1.3 cm on image 18/2, formerly the same. The hilar and infrahilar adenopathy is difficult to compare due to the lack of IV contrast on the prior exam but appears roughly similar subjectively. Lungs/Pleura: Airway thickening is present, suggesting bronchitis or reactive airways disease. There is some airway plugging in the right lower lobe and associated bandlike scarring in the right lower lobe. Mild scarring or atelectasis in the left lower lobe along the hemidiaphragm. Musculoskeletal: Transverse sclerosis in the sternal body compatible with healing fracture. This was not present on 05/08/2016 or on 10/21/2016. chronic 75% compression fracture at T4. CT ABDOMEN PELVIS FINDINGS Hepatobiliary: Scattered hypodense hepatic lesions, the larger lesions are compatible with  cysts and are not appreciably changed from 10/21/2016; the smaller lesions are technically nonspecific although statistically likely to also be small cysts. Gallbladder unremarkable. Pancreas: Unremarkable Spleen: The spleen measures 12.0 by 5.5 by 11.8 cm (volume = 410 cm^3). 1.5 by 1.2 cm hypodense lesion posteriorly in the spleen on  image 54/2, a 0.6 cm hypodense lesion in the spleen on image 56, and a 1.0 by 0.6 cm lesion inferiorly in the spleen hypermetabolic activity in the spleen on the prior PET-CT. Adrenals/Urinary Tract: Right kidney upper pole 1.9 by 1.8 cm lesion on image 7/7 measures 45 Hounsfield units on portal venous phase images and 49 Hounsfield units on delayed phase images., on the prior PET-CT this was clearly hyperdense as well with an SUV of 37 Hounsfield units. This is small enough to where it is difficult to tell if the lesion was truly photopenic. No urinary tract calculi are identified. Stomach/Bowel: Extensive sigmoid diverticulosis. Descending colon diverticulosis. As on the prior exam, the matted mesenteric edema and nodal conglomerate above the sigmoid colon make it difficult to confidently exclude the possibility of diverticulitis. Vascular/Lymphatic: Aortoiliac atherosclerotic vascular disease. There is considerable peripancreatic, porta hepatis, celiac chain, periaortic, common iliac, external iliac, right inguinal, mesenteric, and perirectal adenopathy. Index left periaortic lymph node 1.6 cm in short axis on image 65/2, formerly 2.1 cm. Index right external iliac node 1.7 cm in short axis on image 100/2, formerly 1.8 cm. Index right inguinal lymph node 1.7 cm in short axis on image 108/2, formerly 1.5 cm. There has been an increase in confluent mesenteric infiltration of the sigmoid colon with associated nodularity, which could be inflammatory in the setting of diverticulitis, or due to confluent nodal disease with a more infiltrative pattern. This was present previously, but is denser and more striking today for example on image 95/2. There is some mild omental nodularity in the right upper quadrant. Reproductive: Unremarkable Other: No supplemental non-categorized findings. Musculoskeletal: Notable lumbar spondylosis and degenerative disc disease. Mild degenerative grade 1 retrolisthesis at L1-and L2-3.  Suspected impingement at all levels between L2 and S1. IMPRESSION: 1. Mixed appearance with regard to the extensive nodal disease in the chest, abdomen, and pelvis. Areas of improved adenopathy include the supraclavicular, axillary, and periaortic adenopathy. Areas of worsening include the subcarinal and right inguinal adenopathy. Some of the nodes are very similar to their size on prior PET-CT from 10/21/2016. 2. There is advanced sigmoid diverticulosis and just cephalad to this, there is a region of nodularity and mesenteric infiltration which has progressed compared to 10/21/2016. This may well represent an infiltrative component of malignancy rather than necessarily being due to chronic diverticulitis. No extraluminal gas is identified. No abscess noted. 3. Upper normal splenic size. There are several hypodense nonspecific splenic lesions. These were not readily appreciable on prior noncontrast images, but no associated hypermetabolic activity was previously seen. 4. Hyperdense 1.9 cm right kidney upper pole lesion is not entirely specific. This is probably a complex cyst. Attention on follow up studies suggested. 5. Transverse sclerosis in the sternal body compatible with late phase healing fracture. This was not present on the prior PET-CT from 10/21/2016. Reviewing the patient's records, she apparently received CPR after an anaphylactic reaction to chemotherapy on or around 10/29/2016, and it is possible (although speculative) that this represents residua from sternal trauma during CPR. Correlate with any extended soreness in the chest after that episode. 6. Other imaging findings of potential clinical significance: Aortic Atherosclerosis (ICD10-I70.0). Coronary atherosclerosis. Airway thickening is present, suggesting  bronchitis or reactive airways disease. Scattered hepatic cysts. Multilevel lumbar impingement due to spondylosis and degenerative disc disease. Electronically Signed   By: Van Clines  M.D.   On: 04/30/2017 16:05   Ct Head Code Stroke Wo Contrast  Result Date: 04/29/2017 CLINICAL DATA:  Code stroke. Generalized weakness, particularly in the left leg. EXAM: CT HEAD WITHOUT CONTRAST TECHNIQUE: Contiguous axial images were obtained from the base of the skull through the vertex without intravenous contrast. COMPARISON:  03/31/2017 FINDINGS: Brain: There is no evidence of acute cortically based infarct, intracranial hemorrhage, mass, midline shift, or extra-axial fluid collection. Patchy hypoattenuation in the left caudate and lentiform nuclei is stable to minimally more prominent than on the prior study and likely reflects chronic lacunar infarcts. Patchy to confluent hypodensities in the cerebral white matter bilaterally are similar to the prior study and nonspecific but compatible with moderate chronic small vessel ischemic disease. A dilated perivascular space is noted in the inferior right basal ganglia. Mild generalized cerebral atrophy is not greater than expected for age. Vascular: Calcified atherosclerosis at the skull base. No hyperdense vessel. Skull: No fracture or focal osseous lesion. Sinuses/Orbits: Partially visualized postsurgical changes in the paranasal sinuses. Clear mastoid air cells. Bilateral cataract extraction. Other: None. ASPECTS [facility] Stroke Program Early CT Score) - Ganglionic level infarction (caudate, lentiform nuclei, internal capsule, insula, M1-M3 cortex): 7 - Supraganglionic infarction (M4-M6 cortex): 3 Total score (0-10 with 10 being normal): 10 IMPRESSION: 1. No evidence of acute intracranial abnormality. 2. ASPECTS is 10. 3. Moderate chronic small vessel ischemic disease. These results were called by telephone at the time of interpretation on 04/29/2017 at 11:29 am to Dr. Julianne Rice , who verbally acknowledged these results. Electronically Signed   By: Logan Bores M.D.   On: 04/29/2017 11:30    Microbiology: No results found for this or any previous  visit (from the past 240 hour(s)).   Labs: Basic Metabolic Panel: Recent Labs  Lab 04/28/17 1238 04/29/17 1100 04/29/17 1143 04/29/17 2130 04/30/17 0404 05/01/17 0415  NA 134* 135 136  --  136 136  K 4.2 3.4* 3.4*  --  3.1* 3.9  CL 98 98* 95*  --  104 102  CO2 29 28  --   --  26 27  GLUCOSE 87 101* 99  --  93 95  BUN 15 14 12   --  13 13  CREATININE 0.98 0.88 0.90  --  0.85 0.91  CALCIUM 9.3 9.1  --   --  8.6* 8.9  MG  --   --   --  1.7  --   --   PHOS  --   --   --  3.5  --   --    Liver Function Tests: Recent Labs  Lab 04/28/17 1238 04/29/17 1100  AST 16 19  ALT 12 12*  ALKPHOS 69 64  BILITOT 0.4 0.4  PROT 6.2* 6.3*  ALBUMIN 3.8 3.7   No results for input(s): LIPASE, AMYLASE in the last 168 hours. No results for input(s): AMMONIA in the last 168 hours. CBC: Recent Labs  Lab 04/28/17 1238 04/29/17 1100 04/29/17 1143  WBC 12.5* 10.1  --   NEUTROABS 0.9* 0.6*  --   HGB 11.9 11.4* 11.9*  HCT 36.1 34.6* 35.0*  MCV 89.6 88.3  --   PLT 156 157  --    Cardiac Enzymes: Recent Labs  Lab 04/29/17 1100  CKTOTAL 56       Signed:  Marge Duncans  Darrick Meigs MD.  [facility] 05/01/2017, 1:59 PM

## 2017-05-01 NOTE — Progress Notes (Signed)
05/01/17  1635  Reviewed discharge instructions with patient and her daughter. Patient's daughter verbalized understanding of discharge instructions. Copy of discharge instructions given to patient.

## 2017-05-01 NOTE — Clinical Social Work Note (Signed)
Clinical Social Work Assessment  Patient Details  Name: Carla Conway MRN: [id number] Date of Birth: Oct 12, 1926  Date of referral:  05/01/17               Reason for consult:  Facility Placement                Permission sought to share information with:  Facility Sport and exercise psychologist, Family Supports Permission granted to share information::  Yes, Verbal Permission Granted  Name::     [name]  Agency::     Relationship::  Daughter  Contact Information:  [phone]  Housing/Transportation Living arrangements for the past 2 months:  Apartment Source of Information:  Patient, Adult Children Patient Interpreter Needed:  None Criminal Activity/Legal Involvement Pertinent to Current Situation/Hospitalization:  No - Comment as needed Significant Relationships:  Adult Children Lives with:  Self Do you feel safe going back to the place where you live?  (PT recommending SNF) Need for family participation in patient care:  Yes (Comment)  Care giving concerns:  Patient from home alone. Patient reported that she recently completed rehab at [facility]. Patient's daughter reported that patient currently gets HHPT/ST 2x/week in the home. PT recommending SNF.    Social Worker assessment / plan:  CSW spoke with patient at bedside regarding PT recommendation for SNF. Patient reported that she recently completed rehab and that she loved it. Patient reported that she is agreeable to return to SNF for more rehab. Patient's daughter reported that they would prefer for patient to return to [facility]. CSW explained about patient's insurance requiring prior authorization and inquired about patient's ability to private pay for SNF if insurance authorization is not received. Patient's daughter reported that patient is unable to private pay for SNF, noting patient only gets approx 1700/month from [facility] and has no savings. CSW agreed to follow up with [facility] regarding ability to offer patient a  bed and start insurance authorization.   CSW completed FL2 and contacted [facility] to review patient's referral.  CSW will continue to follow and assist patient with discharge planning.  Employment status:  Retired Nurse, adult PT Recommendations:  [place] / Referral to community resources:  [place]  Patient/Family's Response to care:  Patient/patient's daughter appreciative of CSW assistance with discharge planning. Patient/patient's daughter hopeful that insurance with authorize for patient to go to SNF.   Patient/Family's Understanding of and Emotional Response to Diagnosis, Current Treatment, and Prognosis:  Patient presented calm and pleasant. Patient reported that she prefers to be independent but is agreeable to going to a SNF again for rehab. Patient's daughter involved in patient's care and inquired about ALF placement. CSW agreed to provide patient's daughter with resources.   Emotional Assessment Appearance:  Appears stated age Attitude/Demeanor/Rapport:  Other(Cooperative) Affect (typically observed):  Calm, Pleasant Orientation:  Oriented to Self, Oriented to Place, Oriented to  Time, Oriented to Situation Alcohol / Substance use:  Not Applicable Psych involvement (Current and /or in the community):  No (Comment)  Discharge Needs  Concerns to be addressed:  Care Coordination Readmission within the last 30 days:  Yes Current discharge risk:  Physical Impairment Barriers to Discharge:  No Barriers Identified   Burnis Medin, LCSW 05/01/2017, 10:27 AM

## 2017-05-01 NOTE — Progress Notes (Signed)
CSW contacted [name] to follow up regarding patient's referral. Admissions staff member Narda Rutherford, reported that patient's information was reviewed and that patient does not have a skillable need. Staff reported that patient's insurance would not authorize rehab for weakness.   CSW provided update to patient/patient's daughter. Patient's daughter reported that they would have to discharge home since patient is not able to private pay for SNF. Patient's daughter agreeable to home health services. Patient inquired about hiring someone to come into the home to assist her. CSW informed patient/patient's daughter that RNCM would follow up with them and set up home health services.   CSW updated patient's attending MD.   [place] signing off, no other needs identified at this time.   Abundio Miu, [place] Social Worker [facility] Cell#: [phone]

## 2017-05-01 NOTE — Progress Notes (Signed)
Pt discharging home with daughter and Alvis Lemmings for [facility].

## 2017-05-01 NOTE — Progress Notes (Signed)
Carla Conway   DOB:Feb 21, 1927   DP#:[id number]    Subjective: She felt better, eating better yesterday. Left shoulder and abdominal pain are improving. Noted PT assessment  Objective:  Vitals:   04/30/17 2059 05/01/17 0354  BP: (!) 146/89 (!) 151/56  Pulse: 85 64  Resp: 18 18  Temp: 97.8 F (36.6 C) 97.7 F (36.5 C)  SpO2: 100% 92%     Intake/Output Summary (Last 24 hours) at 05/01/2017 4431 Last data filed at 04/30/2017 2328 Gross per 24 hour  Intake 360 ml  Output 525 ml  Net -165 ml    GENERAL:alert, no distress and comfortable SKIN: skin color, texture, turgor are normal, no rashes or significant lesions ABDOMEN:abdomen soft, non-tender and normal bowel sounds Musculoskeletal:no cyanosis of digits and no clubbing  NEURO: alert & oriented x 3 with fluent speech, very hard of hearing    Labs:  Lab Results  Component Value Date   WBC 10.1 04/29/2017   HGB 11.9 (L) 04/29/2017   HCT 35.0 (L) 04/29/2017   MCV 88.3 04/29/2017   PLT 157 04/29/2017   NEUTROABS 0.6 (L) 04/29/2017    Lab Results  Component Value Date   NA 136 05/01/2017   K 3.9 05/01/2017   CL 102 05/01/2017   CO2 27 05/01/2017    Studies: I have personally reviewed the imaging study Ct Chest W Contrast  Result Date: 04/30/2017 CLINICAL DATA:  Chronic lymphocytic leukemia. New onset of left upper extremity weakness. EXAM: CT CHEST, ABDOMEN, AND PELVIS WITH CONTRAST TECHNIQUE: Multidetector CT imaging of the chest, abdomen and pelvis was performed following the standard protocol during bolus administration of intravenous contrast. CONTRAST:  157m ISOVUE-300 IOPAMIDOL (ISOVUE-300) INJECTION 61% COMPARISON:  Multiple exams, including PET-CT from 10/21/2016 FINDINGS: CT CHEST FINDINGS Cardiovascular: Right Port-A-Cath tip: SVC. Coronary, aortic arch, and branch vessel atherosclerotic vascular disease. Suspected coronary artery stents. Mediastinum/Nodes: Bilateral supraclavicular, axillary, subpectoral,  paratracheal, prevascular, subcarinal, hilar, and infrahilar adenopathy noted. Index right supraclavicular node 1.1 cm in short axis on image 5/2, formerly 2.1 cm. The axillary adenopathy is likewise improved. However, a subcarinal lymph node measures 2.3 cm in short axis on image 25/2 (formerly 1.8 cm). Lower paratracheal node 1.3 cm on image 18/2, formerly the same. The hilar and infrahilar adenopathy is difficult to compare due to the lack of IV contrast on the prior exam but appears roughly similar subjectively. Lungs/Pleura: Airway thickening is present, suggesting bronchitis or reactive airways disease. There is some airway plugging in the right lower lobe and associated bandlike scarring in the right lower lobe. Mild scarring or atelectasis in the left lower lobe along the hemidiaphragm. Musculoskeletal: Transverse sclerosis in the sternal body compatible with healing fracture. This was not present on 05/08/2016 or on 10/21/2016. chronic 75% compression fracture at T4. CT ABDOMEN PELVIS FINDINGS Hepatobiliary: Scattered hypodense hepatic lesions, the larger lesions are compatible with cysts and are not appreciably changed from 10/21/2016; the smaller lesions are technically nonspecific although statistically likely to also be small cysts. Gallbladder unremarkable. Pancreas: Unremarkable Spleen: The spleen measures 12.0 by 5.5 by 11.8 cm (volume = 410 cm^3). 1.5 by 1.2 cm hypodense lesion posteriorly in the spleen on image 54/2, a 0.6 cm hypodense lesion in the spleen on image 56, and a 1.0 by 0.6 cm lesion inferiorly in the spleen hypermetabolic activity in the spleen on the prior PET-CT. Adrenals/Urinary Tract: Right kidney upper pole 1.9 by 1.8 cm lesion on image 7/7 measures 45 Hounsfield units on portal venous phase images  and 49 Hounsfield units on delayed phase images., on the prior PET-CT this was clearly hyperdense as well with an SUV of 37 Hounsfield units. This is small enough to where it is  difficult to tell if the lesion was truly photopenic. No urinary tract calculi are identified. Stomach/Bowel: Extensive sigmoid diverticulosis. Descending colon diverticulosis. As on the prior exam, the matted mesenteric edema and nodal conglomerate above the sigmoid colon make it difficult to confidently exclude the possibility of diverticulitis. Vascular/Lymphatic: Aortoiliac atherosclerotic vascular disease. There is considerable peripancreatic, porta hepatis, celiac chain, periaortic, common iliac, external iliac, right inguinal, mesenteric, and perirectal adenopathy. Index left periaortic lymph node 1.6 cm in short axis on image 65/2, formerly 2.1 cm. Index right external iliac node 1.7 cm in short axis on image 100/2, formerly 1.8 cm. Index right inguinal lymph node 1.7 cm in short axis on image 108/2, formerly 1.5 cm. There has been an increase in confluent mesenteric infiltration of the sigmoid colon with associated nodularity, which could be inflammatory in the setting of diverticulitis, or due to confluent nodal disease with a more infiltrative pattern. This was present previously, but is denser and more striking today for example on image 95/2. There is some mild omental nodularity in the right upper quadrant. Reproductive: Unremarkable Other: No supplemental non-categorized findings. Musculoskeletal: Notable lumbar spondylosis and degenerative disc disease. Mild degenerative grade 1 retrolisthesis at L1-and L2-3. Suspected impingement at all levels between L2 and S1. IMPRESSION: 1. Mixed appearance with regard to the extensive nodal disease in the chest, abdomen, and pelvis. Areas of improved adenopathy include the supraclavicular, axillary, and periaortic adenopathy. Areas of worsening include the subcarinal and right inguinal adenopathy. Some of the nodes are very similar to their size on prior PET-CT from 10/21/2016. 2. There is advanced sigmoid diverticulosis and just cephalad to this, there is a  region of nodularity and mesenteric infiltration which has progressed compared to 10/21/2016. This may well represent an infiltrative component of malignancy rather than necessarily being due to chronic diverticulitis. No extraluminal gas is identified. No abscess noted. 3. Upper normal splenic size. There are several hypodense nonspecific splenic lesions. These were not readily appreciable on prior noncontrast images, but no associated hypermetabolic activity was previously seen. 4. Hyperdense 1.9 cm right kidney upper pole lesion is not entirely specific. This is probably a complex cyst. Attention on follow up studies suggested. 5. Transverse sclerosis in the sternal body compatible with late phase healing fracture. This was not present on the prior PET-CT from 10/21/2016. Reviewing the patient's records, she apparently received CPR after an anaphylactic reaction to chemotherapy on or around 10/29/2016, and it is possible (although speculative) that this represents residua from sternal trauma during CPR. Correlate with any extended soreness in the chest after that episode. 6. Other imaging findings of potential clinical significance: Aortic Atherosclerosis (ICD10-I70.0). Coronary atherosclerosis. Airway thickening is present, suggesting bronchitis or reactive airways disease. Scattered hepatic cysts. Multilevel lumbar impingement due to spondylosis and degenerative disc disease. Electronically Signed   By: Van Clines M.D.   On: 04/30/2017 16:05   Mr Brain Wo Contrast  Result Date: 04/29/2017 CLINICAL DATA:  Increasing weakness and dizziness with left arm and shoulder pain since this morning. Confusion. EXAM: MRI HEAD WITHOUT CONTRAST TECHNIQUE: Multiplanar, multiecho pulse sequences of the brain and surrounding structures were obtained without intravenous contrast. COMPARISON:  Head CT earlier today FINDINGS: Brain: No acute infarction, hemorrhage, hydrocephalus, extra-axial collection or mass lesion.  Chronic small-vessel disease with confluent gliosis in the  cerebral white matter and patchy across the pons. Age congruent cerebral volume. Vascular: Major flow voids are preserved. Skull and upper cervical spine: Negative for marrow lesion. Sinuses/Orbits: Bilateral cataract resection. Other: Best seen on sagittal T1 weighted imaging is enlarged homogeneous nodes in the bilateral upper neck. Patient has history of CLL. Progressively motion degraded study, best obtainable due to confusion. IMPRESSION: 1. No acute finding, including infarct. 2. Moderate to advanced chronic small vessel ischemia seen in the cerebral white matter and pons. 3. Enlarged upper cervical lymph nodes correlating with history of CLL. 4. Moderately motion degraded exam. Electronically Signed   By: Monte Fantasia M.D.   On: 04/29/2017 15:28   Ct Abdomen Pelvis W Contrast  Result Date: 04/30/2017 CLINICAL DATA:  Chronic lymphocytic leukemia. New onset of left upper extremity weakness. EXAM: CT CHEST, ABDOMEN, AND PELVIS WITH CONTRAST TECHNIQUE: Multidetector CT imaging of the chest, abdomen and pelvis was performed following the standard protocol during bolus administration of intravenous contrast. CONTRAST:  160m ISOVUE-300 IOPAMIDOL (ISOVUE-300) INJECTION 61% COMPARISON:  Multiple exams, including PET-CT from 10/21/2016 FINDINGS: CT CHEST FINDINGS Cardiovascular: Right Port-A-Cath tip: SVC. Coronary, aortic arch, and branch vessel atherosclerotic vascular disease. Suspected coronary artery stents. Mediastinum/Nodes: Bilateral supraclavicular, axillary, subpectoral, paratracheal, prevascular, subcarinal, hilar, and infrahilar adenopathy noted. Index right supraclavicular node 1.1 cm in short axis on image 5/2, formerly 2.1 cm. The axillary adenopathy is likewise improved. However, a subcarinal lymph node measures 2.3 cm in short axis on image 25/2 (formerly 1.8 cm). Lower paratracheal node 1.3 cm on image 18/2, formerly the same. The  hilar and infrahilar adenopathy is difficult to compare due to the lack of IV contrast on the prior exam but appears roughly similar subjectively. Lungs/Pleura: Airway thickening is present, suggesting bronchitis or reactive airways disease. There is some airway plugging in the right lower lobe and associated bandlike scarring in the right lower lobe. Mild scarring or atelectasis in the left lower lobe along the hemidiaphragm. Musculoskeletal: Transverse sclerosis in the sternal body compatible with healing fracture. This was not present on 05/08/2016 or on 10/21/2016. chronic 75% compression fracture at T4. CT ABDOMEN PELVIS FINDINGS Hepatobiliary: Scattered hypodense hepatic lesions, the larger lesions are compatible with cysts and are not appreciably changed from 10/21/2016; the smaller lesions are technically nonspecific although statistically likely to also be small cysts. Gallbladder unremarkable. Pancreas: Unremarkable Spleen: The spleen measures 12.0 by 5.5 by 11.8 cm (volume = 410 cm^3). 1.5 by 1.2 cm hypodense lesion posteriorly in the spleen on image 54/2, a 0.6 cm hypodense lesion in the spleen on image 56, and a 1.0 by 0.6 cm lesion inferiorly in the spleen hypermetabolic activity in the spleen on the prior PET-CT. Adrenals/Urinary Tract: Right kidney upper pole 1.9 by 1.8 cm lesion on image 7/7 measures 45 Hounsfield units on portal venous phase images and 49 Hounsfield units on delayed phase images., on the prior PET-CT this was clearly hyperdense as well with an SUV of 37 Hounsfield units. This is small enough to where it is difficult to tell if the lesion was truly photopenic. No urinary tract calculi are identified. Stomach/Bowel: Extensive sigmoid diverticulosis. Descending colon diverticulosis. As on the prior exam, the matted mesenteric edema and nodal conglomerate above the sigmoid colon make it difficult to confidently exclude the possibility of diverticulitis. Vascular/Lymphatic: Aortoiliac  atherosclerotic vascular disease. There is considerable peripancreatic, porta hepatis, celiac chain, periaortic, common iliac, external iliac, right inguinal, mesenteric, and perirectal adenopathy. Index left periaortic lymph node 1.6 cm in short  axis on image 65/2, formerly 2.1 cm. Index right external iliac node 1.7 cm in short axis on image 100/2, formerly 1.8 cm. Index right inguinal lymph node 1.7 cm in short axis on image 108/2, formerly 1.5 cm. There has been an increase in confluent mesenteric infiltration of the sigmoid colon with associated nodularity, which could be inflammatory in the setting of diverticulitis, or due to confluent nodal disease with a more infiltrative pattern. This was present previously, but is denser and more striking today for example on image 95/2. There is some mild omental nodularity in the right upper quadrant. Reproductive: Unremarkable Other: No supplemental non-categorized findings. Musculoskeletal: Notable lumbar spondylosis and degenerative disc disease. Mild degenerative grade 1 retrolisthesis at L1-and L2-3. Suspected impingement at all levels between L2 and S1. IMPRESSION: 1. Mixed appearance with regard to the extensive nodal disease in the chest, abdomen, and pelvis. Areas of improved adenopathy include the supraclavicular, axillary, and periaortic adenopathy. Areas of worsening include the subcarinal and right inguinal adenopathy. Some of the nodes are very similar to their size on prior PET-CT from 10/21/2016. 2. There is advanced sigmoid diverticulosis and just cephalad to this, there is a region of nodularity and mesenteric infiltration which has progressed compared to 10/21/2016. This may well represent an infiltrative component of malignancy rather than necessarily being due to chronic diverticulitis. No extraluminal gas is identified. No abscess noted. 3. Upper normal splenic size. There are several hypodense nonspecific splenic lesions. These were not readily  appreciable on prior noncontrast images, but no associated hypermetabolic activity was previously seen. 4. Hyperdense 1.9 cm right kidney upper pole lesion is not entirely specific. This is probably a complex cyst. Attention on follow up studies suggested. 5. Transverse sclerosis in the sternal body compatible with late phase healing fracture. This was not present on the prior PET-CT from 10/21/2016. Reviewing the patient's records, she apparently received CPR after an anaphylactic reaction to chemotherapy on or around 10/29/2016, and it is possible (although speculative) that this represents residua from sternal trauma during CPR. Correlate with any extended soreness in the chest after that episode. 6. Other imaging findings of potential clinical significance: Aortic Atherosclerosis (ICD10-I70.0). Coronary atherosclerosis. Airway thickening is present, suggesting bronchitis or reactive airways disease. Scattered hepatic cysts. Multilevel lumbar impingement due to spondylosis and degenerative disc disease. Electronically Signed   By: Van Clines M.D.   On: 04/30/2017 16:05   Ct Head Code Stroke Wo Contrast  Result Date: 04/29/2017 CLINICAL DATA:  Code stroke. Generalized weakness, particularly in the left leg. EXAM: CT HEAD WITHOUT CONTRAST TECHNIQUE: Contiguous axial images were obtained from the base of the skull through the vertex without intravenous contrast. COMPARISON:  03/31/2017 FINDINGS: Brain: There is no evidence of acute cortically based infarct, intracranial hemorrhage, mass, midline shift, or extra-axial fluid collection. Patchy hypoattenuation in the left caudate and lentiform nuclei is stable to minimally more prominent than on the prior study and likely reflects chronic lacunar infarcts. Patchy to confluent hypodensities in the cerebral white matter bilaterally are similar to the prior study and nonspecific but compatible with moderate chronic small vessel ischemic disease. A dilated  perivascular space is noted in the inferior right basal ganglia. Mild generalized cerebral atrophy is not greater than expected for age. Vascular: Calcified atherosclerosis at the skull base. No hyperdense vessel. Skull: No fracture or focal osseous lesion. Sinuses/Orbits: Partially visualized postsurgical changes in the paranasal sinuses. Clear mastoid air cells. Bilateral cataract extraction. Other: None. ASPECTS [facility] Stroke Program Early CT Score) -  Ganglionic level infarction (caudate, lentiform nuclei, internal capsule, insula, M1-M3 cortex): 7 - Supraganglionic infarction (M4-M6 cortex): 3 Total score (0-10 with 10 being normal): 10 IMPRESSION: 1. No evidence of acute intracranial abnormality. 2. ASPECTS is 10. 3. Moderate chronic small vessel ischemic disease. These results were called by telephone at the time of interpretation on 04/29/2017 at 11:29 am to Dr. Julianne Rice , who verbally acknowledged these results. Electronically Signed   By: Logan Bores M.D.   On: 04/29/2017 11:30    Assessment & Plan:   CLL (chronic lymphocytic leukemia) (HCC) The patient is very frail She was recently hospitalized after a fall, now with repeated hospitalization for weakness CT imaging show overall positive response compared to the prior imaging study but she still have significant disease burden within her abdomen which I suspect is the cause of her intermittent abdominal pain, loss of appetite and weight loss. Based on my assessment, she has only partial response to treatment Her current outpatient regimen that consists of chlorambucil and prednisone is working very slowly but it is difficult to convince the patient to take further chemotherapy due to her fearfulness of receiving treatment. I have discussed goals of care and plan of care with her daughter. In the ideal world, I think it would be better to bring her in for infusional chemotherapy.  With the treatment of bendamustine along with Gazyva,  response rate is high and the majority of the patients I have treated in the past would achieve near complete remission within 3-6 months of treatment. However, as I mentioned before, it is difficult to convince the patient to receive further therapy. I would defer to her daughter to discuss with the patient.  Without chemotherapy, it is unlikely she will get better.  Her residual disease within her abdomen we would likely cause further anorexia, abdominal pain and weight loss and she will likely succumb to disease If the patient further declined chemotherapy, I would recommend palliative care and hospice  Abdominal pain She has chronic intermittent periumbilical pain that is likely caused by residual disease Continue supportive care for now  Weakness, possible TIA Will defer to primary service for workup I have reviewed outpatient PT assessment.  The patient is at high risk of recurrent falls. Her daughter is not able to provide 24-hour supervision. She would appreciate social worker consult to consider short-term skilled nursing facility/rehab followed by possible future assisted living placement I have consulted social worker to talk to the patient and family  Severe uncontrolled hypertension She is frail and sensitive to low-dose blood pressure medications Continue close monitoring  Discharge planning Unlikely to be discharged due to her weakness and the need for SNF I will return to talk to the patient and family next week  Heath Lark, MD 05/01/2017  8:22 AM

## 2017-05-01 NOTE — Progress Notes (Signed)
Occupational Therapy Treatment Patient Details Name: Carla Conway MRN: [id number] DOB: [birth year] Today's Date: 05/01/2017    History of present illness 84 yo female admitted with dizziness, weakness. Hx of CLL, dementia, COPD, CKD, syncopal episodes   OT comments  Making steady progress. Appropriate to DC home with S of family and continued HHOT.   Follow Up Recommendations  Home health OT    Equipment Recommendations  None recommended by OT    Recommendations for Other Services      Precautions / Restrictions Precautions Precautions: Fall       Mobility Bed Mobility Overal bed mobility: Modified Independent                Transfers Overall transfer level: Needs assistance Equipment used: Rolling walker (2 wheeled) Transfers: Sit to/from [facility] Sit to Stand: Supervision Stand pivot transfers: Supervision            Balance Overall balance assessment: Needs assistance   Sitting balance-Leahy Scale: Good       Standing balance-Leahy Scale: Fair                             ADL either performed or assessed with clinical judgement   ADL                                         General ADL Comments: Pt ambulated around room while packing up her belongings with S. No LOB observed. donned socks/shoes independently. Pt states " I know I have to do a better job of eating. My daughter let me have it"     Vision       Perception     Praxis      Cognition Arousal/Alertness: Awake/alert Behavior During Therapy: WFL for tasks assessed/performed Overall Cognitive Status: History of cognitive impairments - at baseline                                          Exercises     Shoulder Instructions       General Comments      Pertinent Vitals/ Pain       Pain Assessment: No/denies pain  Home Living                                          Prior  Functioning/Environment              Frequency  Min 2X/week        Progress Toward Goals  OT Goals(current goals can now be found in the care plan section)  Progress towards OT goals: Goals met/education completed, patient discharged from OT  Acute Rehab OT Goals Patient Stated Goal: to go home OT Goal Formulation: With patient Time For Goal Achievement: 05/14/17 Potential to Achieve Goals: Good ADL Goals Pt Will Transfer to Toilet: with modified independence;ambulating;regular height toilet;grab bars Additional ADL Goal #1: pt will gather clothes at supervison level and complete ADL without supervision  Plan Discharge plan remains appropriate    Co-evaluation                 AM-PAC PT "6  Clicks" Daily Activity     Outcome Measure   Help from another person eating meals?: None Help from another person taking care of personal grooming?: None Help from another person toileting, which includes using toliet, bedpan, or urinal?: A Little Help from another person bathing (including washing, rinsing, drying)?: A Little Help from another person to put on and taking off regular upper body clothing?: None Help from another person to put on and taking off regular lower body clothing?: None 6 Click Score: 22    End of Session Equipment Utilized During Treatment: Rolling walker  OT Visit Diagnosis: Muscle weakness (generalized) (M62.81)   Activity Tolerance Patient tolerated treatment well   Patient Left in chair;with call bell/phone within reach;with chair alarm set   Nurse Communication Mobility status        Time: 1353-1410 OT Time Calculation (min): 17 min  Charges: OT General Charges $OT Visit: 1 Visit OT Treatments $Self Care/Home Management : 8-22 mins  [facility], OT/L  [phone] 05/01/2017   Carla Conway,HILLARY 05/01/2017, 2:19 PM

## 2017-05-01 NOTE — Progress Notes (Signed)
  Echocardiogram 2D Echocardiogram has been performed.  Krayton Wortley L Androw 05/01/2017, 9:19 AM

## 2017-05-06 ENCOUNTER — Ambulatory Visit (HOSPITAL_COMMUNITY): Payer: Medicare Other

## 2017-05-07 ENCOUNTER — Inpatient Hospital Stay (HOSPITAL_BASED_OUTPATIENT_CLINIC_OR_DEPARTMENT_OTHER): Payer: Medicare Other | Admitting: Hematology and Oncology

## 2017-05-07 ENCOUNTER — Telehealth: Payer: Self-pay | Admitting: Hematology and Oncology

## 2017-05-07 ENCOUNTER — Encounter: Payer: Self-pay | Admitting: Hematology and Oncology

## 2017-05-07 DIAGNOSIS — C9110 Chronic lymphocytic leukemia of B-cell type not having achieved remission: Secondary | ICD-10-CM | POA: Diagnosis not present

## 2017-05-07 DIAGNOSIS — C9190 Lymphoid leukemia, unspecified not having achieved remission: Secondary | ICD-10-CM | POA: Diagnosis not present

## 2017-05-07 DIAGNOSIS — Z9221 Personal history of antineoplastic chemotherapy: Secondary | ICD-10-CM

## 2017-05-07 DIAGNOSIS — Z79899 Other long term (current) drug therapy: Secondary | ICD-10-CM

## 2017-05-07 DIAGNOSIS — R531 Weakness: Secondary | ICD-10-CM | POA: Diagnosis not present

## 2017-05-07 DIAGNOSIS — R1033 Periumbilical pain: Secondary | ICD-10-CM

## 2017-05-07 DIAGNOSIS — R109 Unspecified abdominal pain: Secondary | ICD-10-CM

## 2017-05-07 NOTE — Assessment & Plan Note (Signed)
Recent CT imaging showed positive response to treatment She is currently not symptomatic I recommend discontinuation of all chemotherapy and observe I plan to see her back in a month for further discussion and review plan of care

## 2017-05-07 NOTE — Assessment & Plan Note (Signed)
Her abdominal pain has stabilized since recent discontinuation of chemotherapy Recent CT scan showed persistent disease She will continue over-the-counter antitacids in case gastritis is a contributing factor We will observe for now Her daughter will call me if the patient have recurrence of severe pain

## 2017-05-07 NOTE — Assessment & Plan Note (Signed)
She has significant deconditioning and generalized weakness She will continue home physical therapy Recent evaluation excluded stroke

## 2017-05-07 NOTE — Progress Notes (Signed)
[place] OFFICE PROGRESS NOTE  Patient Care Team: Koirala, Dibas, MD as PCP - General (Family Medicine)  SUMMARY OF ONCOLOGIC HISTORY:   CLL (chronic lymphocytic leukemia) ([place])   05/02/2013 Initial Diagnosis    She was found to have abnormal CBC from at least since 2015. According to the records from her primary care doctor that were faxed to the [facility], she had leukocytosis at least since January 2015. On 05/02/2013, total white blood cell count was 28.3, normal hemoglobin 13.4 and platelet count 239,000. She had predominantly Lymphocytosis. On 06/20/2013, a CBC show white count of 26.7, hemoglobin 12.9 and platelet count of 299,000 On admission, on 05/08/2016, white blood cell count was 38.4, hemoglobin 12.1 and platelet count of 210. Differential show predominantly lymphocytosis.      05/08/2016 Imaging    CT scan of chest 1. Negative for acute pulmonary embolism 2. Numerous pathologic nodes in the mediastinum, hila, axillae, supraclavicular regions and upper abdomen. The adenopathy is suspicious for malignancy such as lymphoma or CLL. If tissue diagnosis is desired, many of the supraclavicular and axillary nodes should be palpable and accessible to excisional biopsy. 3. Low-attenuation liver lesions, incompletely imaged and not characterized but more likely benign. 4. Small hiatal hernia.      09/22/2016 Pathology Results    Peripheral Blood Flow Cytometry - CHRONIC LYMPHOCYTIC LEUKEMIA. - SEE COMMENT. Diagnosis Comment: There is a population of monoclonal B-cells with expression of CD5 and CD23. The phenotype is consistent with chronic lymphocytic leukemia      09/22/2016 Pathology Results    FISH confirmed trisomy 12      10/21/2016 PET scan    1. Extensive adenopathy in the neck, chest, abdomen, and pelvis as detailed above, primarily Deauville 3 and Deauville 4. Mild splenomegaly. 2. High activity in some soft tissue prominence along the anal region,  possibly physiologic or due to hemorrhoidal activity or urinary incontinence, but correlation with anorectal exam recommended. 3. Focal abnormal accentuated high activity in the sigmoid colon along a region of abnormal stranding and diverticulosis, suspicious for active diverticulitis. Correlation with the patient's colon cancer screening history is recommended. If screening is not up-to-date, appropriate screening should be considered. 4. Aortic Atherosclerosis (ICD10-I70.0). Coronary atherosclerosis. 5. An exophytic high density lesion from the right kidney upper pole does not appear to be hypermetabolic and accordingly is most likely to be a benign complex cyst, but this lesion is not fully characterized on today' s exam. If the patient has hematuria or if otherwise clinically warranted, renal protocol MRI or CT examination could be utilized. 6. Some of the smaller hepatic lesions are nonspecific, but the larger hepatic lesions are photopenic and likely cysts.      10/23/2016 Procedure    Placement of single lumen port a cath via right internal jugular vein. The catheter tip lies at the cavo-atrial junction. A power injectable port a cath was placed and is ready for immediate use      10/29/2016 - 10/30/2016 Hospital Admission    She was admitted after allergic reaction to Rituximab      10/29/2016 Adverse Reaction    She received Rituximab complicated by allergic reaction. Treatment was abandoned      11/20/2016 - 04/28/2017 Chemotherapy    The patient is started on chlorambucil and low dose prednisone      04/29/2017 - 05/01/2017 Hospital Admission    She was admitted to the hospital for management of abdominal pain and weakness.  04/30/2017 Imaging    1. Mixed appearance with regard to the extensive nodal disease in the chest, abdomen, and pelvis. Areas of improved adenopathy include the supraclavicular, axillary, and periaortic adenopathy. Areas of worsening include the subcarinal and  right inguinal adenopathy. Some of the nodes are very similar to their size on prior PET-CT from 10/21/2016. 2. There is advanced sigmoid diverticulosis and just cephalad to this, there is a region of nodularity and mesenteric infiltration which has progressed compared to 10/21/2016. This may well represent an infiltrative component of malignancy rather than necessarily being due to chronic diverticulitis. No extraluminal gas is identified. No abscess noted. 3. Upper normal splenic size. There are several hypodense nonspecific splenic lesions. These were not readily appreciable on prior noncontrast images, but no associated hypermetabolic activity was previously seen. 4. Hyperdense 1.9 cm right kidney upper pole lesion is not entirely specific. This is probably a complex cyst. Attention on follow up studies suggested. 5. Transverse sclerosis in the sternal body compatible with late phase healing fracture. This was not present on the prior PET-CT from 10/21/2016. Reviewing the patient's records, she apparently received CPR after an anaphylactic reaction to chemotherapy on or around 10/29/2016, and it is possible (although speculative) that this represents residua from sternal trauma during CPR. Correlate with any extended soreness in the chest after that episode. 6. Other imaging findings of potential clinical significance: Aortic Atherosclerosis (ICD10-I70.0). Coronary atherosclerosis. Airway thickening is present, suggesting bronchitis or reactive airways disease. Scattered hepatic cysts. Multilevel lumbar impingement due to spondylosis and degenerative disc disease.        INTERVAL HISTORY: Please see below for problem oriented charting. She returns for further follow-up with her daughter Since recent discharge from the hospital, she has lost some weight She denies recent recurrence of abdominal pain Her appetite is improving She denies recent nausea or constipation No recent fever or  chills She complained of generalized weakness  REVIEW OF SYSTEMS:   Constitutional: Denies fevers, chills  Eyes: Denies blurriness of vision Ears, nose, mouth, throat, and face: Denies mucositis or sore throat Respiratory: Denies cough, dyspnea or wheezes Cardiovascular: Denies palpitation, chest discomfort or lower extremity swelling Gastrointestinal:  Denies nausea, heartburn or change in bowel habits Skin: Denies abnormal skin rashes Lymphatics: Denies new lymphadenopathy or easy bruising Behavioral/Psych: Mood is stable, no new changes  All other systems were reviewed with the patient and are negative.  I have reviewed the past medical history, past surgical history, social history and family history with the patient and they are unchanged from previous note.  ALLERGIES:  is allergic to rituximab.  MEDICATIONS:  Current Outpatient Medications  Medication Sig Dispense Refill  . acetaminophen (TYLENOL) 500 MG tablet Take 650 mg by mouth daily as needed for mild pain or headache.     Marland Kitchen aspirin EC 81 MG EC tablet Take 1 tablet (81 mg total) by mouth daily. 30 tablet 2  . COMBIVENT RESPIMAT 20-100 MCG/ACT AERS respimat Take 2 puffs by mouth every 6 (six) hours as needed for wheezing or shortness of breath.     . isosorbide mononitrate (IMDUR) 30 MG 24 hr tablet Take 30 mg by mouth daily.     Marland Kitchen LEUKERAN 2 MG tablet TAKE 1 TABLET (2 MG TOTAL) BY MOUTH DAILY. GIVE ON AN EMPTY STOMACH 1 HOUR BEFORE OR 2 HOURS AFTER MEALS. 25 tablet 1  . lidocaine-prilocaine (EMLA) cream Apply 1 application topically as needed. (Patient taking differently: Apply 1 application topically as needed (For port-a-cath.). )  30 g 6  . lovastatin (MEVACOR) 40 MG tablet Take 40 mg by mouth at bedtime.     . metoprolol tartrate (LOPRESSOR) 25 MG tablet Take 1 tablet (25 mg total) by mouth 2 (two) times daily. 60 tablet 2  . Nutritional Supplements (ENSURE PO) Take 1 Can by mouth daily.    Marland Kitchen omeprazole (PRILOSEC) 20 MG  capsule Take 1 capsule (20 mg total) by mouth daily. 30 capsule 11  . ondansetron (ZOFRAN) 8 MG tablet Take 1 tablet (8 mg total) by mouth every 8 (eight) hours as needed for nausea. (Patient not taking: Reported on 04/29/2017) 30 tablet 3  . potassium chloride (K-DUR) 10 MEQ tablet Take 10 mEq by mouth daily.    . predniSONE (DELTASONE) 5 MG tablet Take 1 tablet (5 mg total) by mouth daily with breakfast. 60 tablet 1  . QUEtiapine (SEROQUEL) 25 MG tablet Take 12.5 mg by mouth at bedtime.     . sertraline (ZOLOFT) 50 MG tablet Take 50 mg by mouth daily.  6  . triamterene-hydrochlorothiazide (MAXZIDE-25) 37.5-25 MG tablet Take 0.5 tablets by mouth daily. 30 tablet 2   No current facility-administered medications for this visit.     PHYSICAL EXAMINATION: ECOG PERFORMANCE STATUS: 2 - Symptomatic, <50% confined to bed  Vitals:   05/07/17 1442  BP: (!) 155/67  Pulse: 66  Resp: 18  Temp: (!) 97.4 F (36.3 C)  SpO2: 94%   Filed Weights   05/07/17 1442  Weight: 144 lb 3.2 oz (65.4 kg)    GENERAL:alert, no distress and comfortable SKIN: skin color, texture, turgor are normal, no rashes or significant lesions EYES: normal, Conjunctiva are pink and non-injected, sclera clear NEURO: alert & oriented x 3 with fluent speech, no focal motor/sensory deficits  LABORATORY DATA:  I have reviewed the data as listed    Component Value Date/Time   NA 136 05/01/2017 0415   NA 138 02/17/2017 0810   K 3.9 05/01/2017 0415   K 3.7 02/17/2017 0810   CL 102 05/01/2017 0415   CO2 27 05/01/2017 0415   CO2 27 02/17/2017 0810   GLUCOSE 95 05/01/2017 0415   GLUCOSE 92 02/17/2017 0810   BUN 13 05/01/2017 0415   BUN 16.4 02/17/2017 0810   CREATININE 0.91 05/01/2017 0415   CREATININE 1.1 02/17/2017 0810   CALCIUM 8.9 05/01/2017 0415   CALCIUM 9.3 02/17/2017 0810   PROT 6.3 (L) 04/29/2017 1100   PROT 6.4 02/17/2017 0810   ALBUMIN 3.7 04/29/2017 1100   ALBUMIN 3.9 02/17/2017 0810   AST 19 04/29/2017  1100   AST 13 02/17/2017 0810   ALT 12 (L) 04/29/2017 1100   ALT 13 02/17/2017 0810   ALKPHOS 64 04/29/2017 1100   ALKPHOS 74 02/17/2017 0810   BILITOT 0.4 04/29/2017 1100   BILITOT 0.60 02/17/2017 0810   GFRNONAA 55 (L) 05/01/2017 0415   GFRAA >60 05/01/2017 0415    No results found for: SPEP, UPEP  Lab Results  Component Value Date   WBC 10.1 04/29/2017   NEUTROABS 0.6 (L) 04/29/2017   HGB 11.9 (L) 04/29/2017   HCT 35.0 (L) 04/29/2017   MCV 88.3 04/29/2017   PLT 157 04/29/2017      Chemistry      Component Value Date/Time   NA 136 05/01/2017 0415   NA 138 02/17/2017 0810   K 3.9 05/01/2017 0415   K 3.7 02/17/2017 0810   CL 102 05/01/2017 0415   CO2 27 05/01/2017 0415   CO2  27 02/17/2017 0810   BUN 13 05/01/2017 0415   BUN 16.4 02/17/2017 0810   CREATININE 0.91 05/01/2017 0415   CREATININE 1.1 02/17/2017 0810      Component Value Date/Time   CALCIUM 8.9 05/01/2017 0415   CALCIUM 9.3 02/17/2017 0810   ALKPHOS 64 04/29/2017 1100   ALKPHOS 74 02/17/2017 0810   AST 19 04/29/2017 1100   AST 13 02/17/2017 0810   ALT 12 (L) 04/29/2017 1100   ALT 13 02/17/2017 0810   BILITOT 0.4 04/29/2017 1100   BILITOT 0.60 02/17/2017 0810       RADIOGRAPHIC STUDIES: I have personally reviewed the radiological images as listed and agreed with the findings in the report. Ct Chest W Contrast  Result Date: 04/30/2017 CLINICAL DATA:  Chronic lymphocytic leukemia. New onset of left upper extremity weakness. EXAM: CT CHEST, ABDOMEN, AND PELVIS WITH CONTRAST TECHNIQUE: Multidetector CT imaging of the chest, abdomen and pelvis was performed following the standard protocol during bolus administration of intravenous contrast. CONTRAST:  131m ISOVUE-300 IOPAMIDOL (ISOVUE-300) INJECTION 61% COMPARISON:  Multiple exams, including PET-CT from 10/21/2016 FINDINGS: CT CHEST FINDINGS Cardiovascular: Right Port-A-Cath tip: SVC. Coronary, aortic arch, and branch vessel atherosclerotic vascular  disease. Suspected coronary artery stents. Mediastinum/Nodes: Bilateral supraclavicular, axillary, subpectoral, paratracheal, prevascular, subcarinal, hilar, and infrahilar adenopathy noted. Index right supraclavicular node 1.1 cm in short axis on image 5/2, formerly 2.1 cm. The axillary adenopathy is likewise improved. However, a subcarinal lymph node measures 2.3 cm in short axis on image 25/2 (formerly 1.8 cm). Lower paratracheal node 1.3 cm on image 18/2, formerly the same. The hilar and infrahilar adenopathy is difficult to compare due to the lack of IV contrast on the prior exam but appears roughly similar subjectively. Lungs/Pleura: Airway thickening is present, suggesting bronchitis or reactive airways disease. There is some airway plugging in the right lower lobe and associated bandlike scarring in the right lower lobe. Mild scarring or atelectasis in the left lower lobe along the hemidiaphragm. Musculoskeletal: Transverse sclerosis in the sternal body compatible with healing fracture. This was not present on 05/08/2016 or on 10/21/2016. chronic 75% compression fracture at T4. CT ABDOMEN PELVIS FINDINGS Hepatobiliary: Scattered hypodense hepatic lesions, the larger lesions are compatible with cysts and are not appreciably changed from 10/21/2016; the smaller lesions are technically nonspecific although statistically likely to also be small cysts. Gallbladder unremarkable. Pancreas: Unremarkable Spleen: The spleen measures 12.0 by 5.5 by 11.8 cm (volume = 410 cm^3). 1.5 by 1.2 cm hypodense lesion posteriorly in the spleen on image 54/2, a 0.6 cm hypodense lesion in the spleen on image 56, and a 1.0 by 0.6 cm lesion inferiorly in the spleen hypermetabolic activity in the spleen on the prior PET-CT. Adrenals/Urinary Tract: Right kidney upper pole 1.9 by 1.8 cm lesion on image 7/7 measures 45 Hounsfield units on portal venous phase images and 49 Hounsfield units on delayed phase images., on the prior PET-CT  this was clearly hyperdense as well with an SUV of 37 Hounsfield units. This is small enough to where it is difficult to tell if the lesion was truly photopenic. No urinary tract calculi are identified. Stomach/Bowel: Extensive sigmoid diverticulosis. Descending colon diverticulosis. As on the prior exam, the matted mesenteric edema and nodal conglomerate above the sigmoid colon make it difficult to confidently exclude the possibility of diverticulitis. Vascular/Lymphatic: Aortoiliac atherosclerotic vascular disease. There is considerable peripancreatic, porta hepatis, celiac chain, periaortic, common iliac, external iliac, right inguinal, mesenteric, and perirectal adenopathy. Index left periaortic lymph node 1.6  cm in short axis on image 65/2, formerly 2.1 cm. Index right external iliac node 1.7 cm in short axis on image 100/2, formerly 1.8 cm. Index right inguinal lymph node 1.7 cm in short axis on image 108/2, formerly 1.5 cm. There has been an increase in confluent mesenteric infiltration of the sigmoid colon with associated nodularity, which could be inflammatory in the setting of diverticulitis, or due to confluent nodal disease with a more infiltrative pattern. This was present previously, but is denser and more striking today for example on image 95/2. There is some mild omental nodularity in the right upper quadrant. Reproductive: Unremarkable Other: No supplemental non-categorized findings. Musculoskeletal: Notable lumbar spondylosis and degenerative disc disease. Mild degenerative grade 1 retrolisthesis at L1-and L2-3. Suspected impingement at all levels between L2 and S1. IMPRESSION: 1. Mixed appearance with regard to the extensive nodal disease in the chest, abdomen, and pelvis. Areas of improved adenopathy include the supraclavicular, axillary, and periaortic adenopathy. Areas of worsening include the subcarinal and right inguinal adenopathy. Some of the nodes are very similar to their size on prior  PET-CT from 10/21/2016. 2. There is advanced sigmoid diverticulosis and just cephalad to this, there is a region of nodularity and mesenteric infiltration which has progressed compared to 10/21/2016. This may well represent an infiltrative component of malignancy rather than necessarily being due to chronic diverticulitis. No extraluminal gas is identified. No abscess noted. 3. Upper normal splenic size. There are several hypodense nonspecific splenic lesions. These were not readily appreciable on prior noncontrast images, but no associated hypermetabolic activity was previously seen. 4. Hyperdense 1.9 cm right kidney upper pole lesion is not entirely specific. This is probably a complex cyst. Attention on follow up studies suggested. 5. Transverse sclerosis in the sternal body compatible with late phase healing fracture. This was not present on the prior PET-CT from 10/21/2016. Reviewing the patient's records, she apparently received CPR after an anaphylactic reaction to chemotherapy on or around 10/29/2016, and it is possible (although speculative) that this represents residua from sternal trauma during CPR. Correlate with any extended soreness in the chest after that episode. 6. Other imaging findings of potential clinical significance: Aortic Atherosclerosis (ICD10-I70.0). Coronary atherosclerosis. Airway thickening is present, suggesting bronchitis or reactive airways disease. Scattered hepatic cysts. Multilevel lumbar impingement due to spondylosis and degenerative disc disease. Electronically Signed   By: Van Clines M.D.   On: 04/30/2017 16:05   Mr Brain Wo Contrast  Result Date: 04/29/2017 CLINICAL DATA:  Increasing weakness and dizziness with left arm and shoulder pain since this morning. Confusion. EXAM: MRI HEAD WITHOUT CONTRAST TECHNIQUE: Multiplanar, multiecho pulse sequences of the brain and surrounding structures were obtained without intravenous contrast. COMPARISON:  Head CT earlier  today FINDINGS: Brain: No acute infarction, hemorrhage, hydrocephalus, extra-axial collection or mass lesion. Chronic small-vessel disease with confluent gliosis in the cerebral white matter and patchy across the pons. Age congruent cerebral volume. Vascular: Major flow voids are preserved. Skull and upper cervical spine: Negative for marrow lesion. Sinuses/Orbits: Bilateral cataract resection. Other: Best seen on sagittal T1 weighted imaging is enlarged homogeneous nodes in the bilateral upper neck. Patient has history of CLL. Progressively motion degraded study, best obtainable due to confusion. IMPRESSION: 1. No acute finding, including infarct. 2. Moderate to advanced chronic small vessel ischemia seen in the cerebral white matter and pons. 3. Enlarged upper cervical lymph nodes correlating with history of CLL. 4. Moderately motion degraded exam. Electronically Signed   By: Neva Seat.D.  On: 04/29/2017 15:28   Ct Abdomen Pelvis W Contrast  Result Date: 04/30/2017 CLINICAL DATA:  Chronic lymphocytic leukemia. New onset of left upper extremity weakness. EXAM: CT CHEST, ABDOMEN, AND PELVIS WITH CONTRAST TECHNIQUE: Multidetector CT imaging of the chest, abdomen and pelvis was performed following the standard protocol during bolus administration of intravenous contrast. CONTRAST:  19m ISOVUE-300 IOPAMIDOL (ISOVUE-300) INJECTION 61% COMPARISON:  Multiple exams, including PET-CT from 10/21/2016 FINDINGS: CT CHEST FINDINGS Cardiovascular: Right Port-A-Cath tip: SVC. Coronary, aortic arch, and branch vessel atherosclerotic vascular disease. Suspected coronary artery stents. Mediastinum/Nodes: Bilateral supraclavicular, axillary, subpectoral, paratracheal, prevascular, subcarinal, hilar, and infrahilar adenopathy noted. Index right supraclavicular node 1.1 cm in short axis on image 5/2, formerly 2.1 cm. The axillary adenopathy is likewise improved. However, a subcarinal lymph node measures 2.3 cm in short  axis on image 25/2 (formerly 1.8 cm). Lower paratracheal node 1.3 cm on image 18/2, formerly the same. The hilar and infrahilar adenopathy is difficult to compare due to the lack of IV contrast on the prior exam but appears roughly similar subjectively. Lungs/Pleura: Airway thickening is present, suggesting bronchitis or reactive airways disease. There is some airway plugging in the right lower lobe and associated bandlike scarring in the right lower lobe. Mild scarring or atelectasis in the left lower lobe along the hemidiaphragm. Musculoskeletal: Transverse sclerosis in the sternal body compatible with healing fracture. This was not present on 05/08/2016 or on 10/21/2016. chronic 75% compression fracture at T4. CT ABDOMEN PELVIS FINDINGS Hepatobiliary: Scattered hypodense hepatic lesions, the larger lesions are compatible with cysts and are not appreciably changed from 10/21/2016; the smaller lesions are technically nonspecific although statistically likely to also be small cysts. Gallbladder unremarkable. Pancreas: Unremarkable Spleen: The spleen measures 12.0 by 5.5 by 11.8 cm (volume = 410 cm^3). 1.5 by 1.2 cm hypodense lesion posteriorly in the spleen on image 54/2, a 0.6 cm hypodense lesion in the spleen on image 56, and a 1.0 by 0.6 cm lesion inferiorly in the spleen hypermetabolic activity in the spleen on the prior PET-CT. Adrenals/Urinary Tract: Right kidney upper pole 1.9 by 1.8 cm lesion on image 7/7 measures 45 Hounsfield units on portal venous phase images and 49 Hounsfield units on delayed phase images., on the prior PET-CT this was clearly hyperdense as well with an SUV of 37 Hounsfield units. This is small enough to where it is difficult to tell if the lesion was truly photopenic. No urinary tract calculi are identified. Stomach/Bowel: Extensive sigmoid diverticulosis. Descending colon diverticulosis. As on the prior exam, the matted mesenteric edema and nodal conglomerate above the sigmoid colon  make it difficult to confidently exclude the possibility of diverticulitis. Vascular/Lymphatic: Aortoiliac atherosclerotic vascular disease. There is considerable peripancreatic, porta hepatis, celiac chain, periaortic, common iliac, external iliac, right inguinal, mesenteric, and perirectal adenopathy. Index left periaortic lymph node 1.6 cm in short axis on image 65/2, formerly 2.1 cm. Index right external iliac node 1.7 cm in short axis on image 100/2, formerly 1.8 cm. Index right inguinal lymph node 1.7 cm in short axis on image 108/2, formerly 1.5 cm. There has been an increase in confluent mesenteric infiltration of the sigmoid colon with associated nodularity, which could be inflammatory in the setting of diverticulitis, or due to confluent nodal disease with a more infiltrative pattern. This was present previously, but is denser and more striking today for example on image 95/2. There is some mild omental nodularity in the right upper quadrant. Reproductive: Unremarkable Other: No supplemental non-categorized findings. Musculoskeletal: Notable lumbar  spondylosis and degenerative disc disease. Mild degenerative grade 1 retrolisthesis at L1-and L2-3. Suspected impingement at all levels between L2 and S1. IMPRESSION: 1. Mixed appearance with regard to the extensive nodal disease in the chest, abdomen, and pelvis. Areas of improved adenopathy include the supraclavicular, axillary, and periaortic adenopathy. Areas of worsening include the subcarinal and right inguinal adenopathy. Some of the nodes are very similar to their size on prior PET-CT from 10/21/2016. 2. There is advanced sigmoid diverticulosis and just cephalad to this, there is a region of nodularity and mesenteric infiltration which has progressed compared to 10/21/2016. This may well represent an infiltrative component of malignancy rather than necessarily being due to chronic diverticulitis. No extraluminal gas is identified. No abscess noted. 3.  Upper normal splenic size. There are several hypodense nonspecific splenic lesions. These were not readily appreciable on prior noncontrast images, but no associated hypermetabolic activity was previously seen. 4. Hyperdense 1.9 cm right kidney upper pole lesion is not entirely specific. This is probably a complex cyst. Attention on follow up studies suggested. 5. Transverse sclerosis in the sternal body compatible with late phase healing fracture. This was not present on the prior PET-CT from 10/21/2016. Reviewing the patient's records, she apparently received CPR after an anaphylactic reaction to chemotherapy on or around 10/29/2016, and it is possible (although speculative) that this represents residua from sternal trauma during CPR. Correlate with any extended soreness in the chest after that episode. 6. Other imaging findings of potential clinical significance: Aortic Atherosclerosis (ICD10-I70.0). Coronary atherosclerosis. Airway thickening is present, suggesting bronchitis or reactive airways disease. Scattered hepatic cysts. Multilevel lumbar impingement due to spondylosis and degenerative disc disease. Electronically Signed   By: Van Clines M.D.   On: 04/30/2017 16:05   Ct Head Code Stroke Wo Contrast  Result Date: 04/29/2017 CLINICAL DATA:  Code stroke. Generalized weakness, particularly in the left leg. EXAM: CT HEAD WITHOUT CONTRAST TECHNIQUE: Contiguous axial images were obtained from the base of the skull through the vertex without intravenous contrast. COMPARISON:  03/31/2017 FINDINGS: Brain: There is no evidence of acute cortically based infarct, intracranial hemorrhage, mass, midline shift, or extra-axial fluid collection. Patchy hypoattenuation in the left caudate and lentiform nuclei is stable to minimally more prominent than on the prior study and likely reflects chronic lacunar infarcts. Patchy to confluent hypodensities in the cerebral white matter bilaterally are similar to the  prior study and nonspecific but compatible with moderate chronic small vessel ischemic disease. A dilated perivascular space is noted in the inferior right basal ganglia. Mild generalized cerebral atrophy is not greater than expected for age. Vascular: Calcified atherosclerosis at the skull base. No hyperdense vessel. Skull: No fracture or focal osseous lesion. Sinuses/Orbits: Partially visualized postsurgical changes in the paranasal sinuses. Clear mastoid air cells. Bilateral cataract extraction. Other: None. ASPECTS [facility] Stroke Program Early CT Score) - Ganglionic level infarction (caudate, lentiform nuclei, internal capsule, insula, M1-M3 cortex): 7 - Supraganglionic infarction (M4-M6 cortex): 3 Total score (0-10 with 10 being normal): 10 IMPRESSION: 1. No evidence of acute intracranial abnormality. 2. ASPECTS is 10. 3. Moderate chronic small vessel ischemic disease. These results were called by telephone at the time of interpretation on 04/29/2017 at 11:29 am to Dr. Julianne Rice , who verbally acknowledged these results. Electronically Signed   By: Logan Bores M.D.   On: 04/29/2017 11:30    ASSESSMENT & PLAN:  CLL (chronic lymphocytic leukemia) (HCC) Recent CT imaging showed positive response to treatment She is currently not symptomatic I recommend  discontinuation of all chemotherapy and observe I plan to see her back in a month for further discussion and review plan of care  Generalized weakness She has significant deconditioning and generalized weakness She will continue home physical therapy Recent evaluation excluded stroke  Abdominal pain Her abdominal pain has stabilized since recent discontinuation of chemotherapy Recent CT scan showed persistent disease She will continue over-the-counter antitacids in case gastritis is a contributing factor We will observe for now Her daughter will call me if the patient have recurrence of severe pain   No orders of the defined types were  placed in this encounter.  All questions were answered. The patient knows to call the clinic with any problems, questions or concerns. No barriers to learning was detected. I spent 15 minutes counseling the patient face to face. The total time spent in the appointment was 20 minutes and more than 50% was on counseling and review of test results     Heath Lark, MD 05/07/2017 3:05 PM

## 2017-05-07 NOTE — Telephone Encounter (Signed)
Gave patient AVs and calendar of upcoming February appointments.

## 2017-05-13 ENCOUNTER — Emergency Department (HOSPITAL_COMMUNITY): Payer: Medicare Other

## 2017-05-13 ENCOUNTER — Other Ambulatory Visit: Payer: Self-pay

## 2017-05-13 ENCOUNTER — Emergency Department (HOSPITAL_COMMUNITY)
Admission: EM | Admit: 2017-05-13 | Discharge: 2017-05-13 | Discharge status: Against Medical Advice | Payer: Medicare Other | Attending: Emergency Medicine | Admitting: Emergency Medicine

## 2017-05-13 ENCOUNTER — Encounter (HOSPITAL_COMMUNITY): Payer: Self-pay

## 2017-05-13 DIAGNOSIS — Z5321 Procedure and treatment not carried out due to patient leaving prior to being seen by health care provider: Secondary | ICD-10-CM | POA: Insufficient documentation

## 2017-05-13 DIAGNOSIS — R079 Chest pain, unspecified: Secondary | ICD-10-CM | POA: Insufficient documentation

## 2017-05-13 LAB — BASIC METABOLIC PANEL
ANION GAP: 11 (ref 5–15)
BUN: 20 mg/dL (ref 6–20)
CO2: 25 mmol/L (ref 22–32)
Calcium: 9.4 mg/dL (ref 8.9–10.3)
Chloride: 97 mmol/L — ABNORMAL LOW (ref 101–111)
Creatinine, Ser: 0.97 mg/dL (ref 0.44–1.00)
GFR calc Af Amer: 59 mL/min — ABNORMAL LOW (ref >60)
GFR, EST NON AFRICAN AMERICAN: 51 mL/min — AB (ref >60)
GLUCOSE: 111 mg/dL — AB (ref 65–99)
POTASSIUM: 4 mmol/L (ref 3.5–5.1)
SODIUM: 133 mmol/L — AB (ref 135–145)

## 2017-05-13 LAB — I-STAT TROPONIN, ED: Troponin i, poc: 0 ng/mL (ref 0.00–0.08)

## 2017-05-13 LAB — CBC
HEMATOCRIT: 39 % (ref 36.0–46.0)
HEMOGLOBIN: 12.5 g/dL (ref 12.0–15.0)
MCH: 28.7 pg (ref 26.0–34.0)
MCHC: 32.1 g/dL (ref 30.0–36.0)
MCV: 89.7 fL (ref 78.0–100.0)
Platelets: 220 10*3/uL (ref 150–400)
RBC: 4.35 MIL/uL (ref 3.87–5.11)
RDW: 15.5 % (ref 11.5–15.5)
WBC: 16.3 10*3/uL — AB (ref 4.0–10.5)

## 2017-05-13 MED ORDER — ONDANSETRON 4 MG PO TBDP
4.0000 mg | ORAL_TABLET | Freq: Once | ORAL | Status: AC | PRN
  Administered 2017-05-13: 4 mg via ORAL
  Filled 2017-05-13: qty 1

## 2017-05-13 NOTE — ED Notes (Signed)
Patient pulled me aside in Lobby saying she is sick to her stomach. Chart reviewed. Burchinal for Zofran.

## 2017-05-13 NOTE — ED Triage Notes (Signed)
Patient from [place] facility for chest pain x 2 days. States that her pain is worse with inspiration and reports cough. Alert and oriented

## 2017-05-13 NOTE — ED Notes (Signed)
Pt. And family stated they were tired and wanted to leave. IV was discharged by another tech. Pt. has left.

## 2017-05-19 ENCOUNTER — Telehealth: Payer: Self-pay | Admitting: *Deleted

## 2017-05-19 NOTE — Telephone Encounter (Signed)
Received call from Con Memos, OT @ [facility] reporting pt's swollen lymph nodes on neck.   Spoke with Renee, and was informed that she noticed 2 swollen lymph nodes - size smaller than a quarter - on right side of pt's neck yesterday .   Pt denied pain, able to swallow fluids fine, has no appetite.   Daughter informed OT that pt has abdominal pain each time pt attempts to eat.   Informed Renee that message will be sent to Dr. Alvy Bimler.   Collaborative nurse will contact daughter with further instructions from Dr. Alvy Bimler.  Joseph Art stated she will inform daughter of above instructions. Renee's     Phone      [phone].

## 2017-05-20 ENCOUNTER — Telehealth: Payer: Self-pay | Admitting: *Deleted

## 2017-05-20 NOTE — Telephone Encounter (Signed)
LM with Dr Calton Dach note regarding concerns on 2/12. To call if has questions

## 2017-05-20 NOTE — Telephone Encounter (Signed)
Called daughter to follow up on call yesterday from OT.   They will keep appt as scheduled.

## 2017-05-20 NOTE — Telephone Encounter (Signed)
The LN in the neck is known to be lymphoma/CLL.  The pain in her abdomen is also likely due to lymphoma/CLL  We had extensive discussion about goals of care, etc. The patient did not want aggressive chemo due to fear from previous allergic reaction Her daughter [name] has to deferred a lot of decision to her mother. If patient does not want aggressive chemo, she will likely continue to have these symptoms. I have made a lot of suggestions in the past. I can bring her back again for discussion if that's what they want. Let me know what day/time works best for her daughter

## 2017-06-04 ENCOUNTER — Encounter: Payer: Self-pay | Admitting: Hematology and Oncology

## 2017-06-04 ENCOUNTER — Inpatient Hospital Stay: Payer: Medicare Other | Attending: Hematology and Oncology

## 2017-06-04 ENCOUNTER — Inpatient Hospital Stay (HOSPITAL_BASED_OUTPATIENT_CLINIC_OR_DEPARTMENT_OTHER): Payer: Medicare Other | Admitting: Hematology and Oncology

## 2017-06-04 ENCOUNTER — Other Ambulatory Visit: Payer: Self-pay | Admitting: Hematology and Oncology

## 2017-06-04 VITALS — BP 146/72 | HR 84 | Temp 97.7°F | Resp 18 | Ht 66.0 in | Wt 138.4 lb

## 2017-06-04 DIAGNOSIS — Z7982 Long term (current) use of aspirin: Secondary | ICD-10-CM | POA: Insufficient documentation

## 2017-06-04 DIAGNOSIS — K5909 Other constipation: Secondary | ICD-10-CM | POA: Insufficient documentation

## 2017-06-04 DIAGNOSIS — R630 Anorexia: Secondary | ICD-10-CM

## 2017-06-04 DIAGNOSIS — K449 Diaphragmatic hernia without obstruction or gangrene: Secondary | ICD-10-CM | POA: Diagnosis not present

## 2017-06-04 DIAGNOSIS — R634 Abnormal weight loss: Secondary | ICD-10-CM | POA: Diagnosis not present

## 2017-06-04 DIAGNOSIS — R1033 Periumbilical pain: Secondary | ICD-10-CM

## 2017-06-04 DIAGNOSIS — I700 Atherosclerosis of aorta: Secondary | ICD-10-CM | POA: Insufficient documentation

## 2017-06-04 DIAGNOSIS — R64 Cachexia: Secondary | ICD-10-CM

## 2017-06-04 DIAGNOSIS — R109 Unspecified abdominal pain: Secondary | ICD-10-CM

## 2017-06-04 DIAGNOSIS — Z79899 Other long term (current) drug therapy: Secondary | ICD-10-CM | POA: Diagnosis not present

## 2017-06-04 DIAGNOSIS — N183 Chronic kidney disease, stage 3 unspecified: Secondary | ICD-10-CM

## 2017-06-04 DIAGNOSIS — C9110 Chronic lymphocytic leukemia of B-cell type not having achieved remission: Secondary | ICD-10-CM | POA: Insufficient documentation

## 2017-06-04 DIAGNOSIS — C8300 Small cell B-cell lymphoma, unspecified site: Secondary | ICD-10-CM

## 2017-06-04 DIAGNOSIS — Z7189 Other specified counseling: Secondary | ICD-10-CM

## 2017-06-04 DIAGNOSIS — F329 Major depressive disorder, single episode, unspecified: Secondary | ICD-10-CM | POA: Insufficient documentation

## 2017-06-04 LAB — COMPREHENSIVE METABOLIC PANEL
ALBUMIN: 4.1 g/dL (ref 3.5–5.0)
ALK PHOS: 86 U/L (ref 40–150)
ALT: 13 U/L (ref 0–55)
ANION GAP: 11 (ref 3–11)
AST: 17 U/L (ref 5–34)
BILIRUBIN TOTAL: 0.5 mg/dL (ref 0.2–1.2)
BUN: 25 mg/dL (ref 7–26)
CALCIUM: 9.9 mg/dL (ref 8.4–10.4)
CO2: 27 mmol/L (ref 22–29)
Chloride: 101 mmol/L (ref 98–109)
Creatinine, Ser: 1.16 mg/dL — ABNORMAL HIGH (ref 0.60–1.10)
GFR calc Af Amer: 47 mL/min — ABNORMAL LOW (ref >60)
GFR calc non Af Amer: 41 mL/min — ABNORMAL LOW (ref >60)
GLUCOSE: 101 mg/dL (ref 70–140)
Potassium: 4 mmol/L (ref 3.5–5.1)
SODIUM: 139 mmol/L (ref 136–145)
Total Protein: 6.9 g/dL (ref 6.4–8.3)

## 2017-06-04 LAB — CBC WITH DIFFERENTIAL/PLATELET
BASOS ABS: 0.2 10*3/uL — AB (ref 0.0–0.1)
BASOS PCT: 1 %
EOS ABS: 0.3 10*3/uL (ref 0.0–0.5)
Eosinophils Relative: 2 %
HEMATOCRIT: 40.4 % (ref 34.8–46.6)
HEMOGLOBIN: 13.1 g/dL (ref 11.6–15.9)
Lymphocytes Relative: 90 %
Lymphs Abs: 16.1 10*3/uL — ABNORMAL HIGH (ref 0.9–3.3)
MCH: 28.6 pg (ref 25.1–34.0)
MCHC: 32.4 g/dL (ref 31.5–36.0)
MCV: 88.1 fL (ref 79.5–101.0)
Monocytes Absolute: 0.1 10*3/uL (ref 0.1–0.9)
Monocytes Relative: 0 %
NEUTROS ABS: 1.2 10*3/uL — AB (ref 1.5–6.5)
Neutrophils Relative %: 7 %
Platelets: 202 10*3/uL (ref 145–400)
RBC: 4.58 MIL/uL (ref 3.70–5.45)
RDW: 15.5 % — ABNORMAL HIGH (ref 11.2–14.5)
WBC: 17.9 10*3/uL — ABNORMAL HIGH (ref 3.9–10.3)

## 2017-06-04 MED ORDER — ACYCLOVIR 400 MG PO TABS: 400.0000 mg | ORAL_TABLET | Freq: Every day | ORAL | 3 refills | Status: AC

## 2017-06-04 MED ORDER — ONDANSETRON HCL 8 MG PO TABS: 8.0000 mg | ORAL_TABLET | Freq: Two times a day (BID) | ORAL | 1 refills | Status: DC | PRN

## 2017-06-04 MED ORDER — PROCHLORPERAZINE MALEATE 10 MG PO TABS
10.0000 mg | ORAL_TABLET | Freq: Four times a day (QID) | ORAL | 1 refills | Status: DC | PRN
Start: 2017-06-04 — End: 2018-03-29

## 2017-06-04 MED ORDER — PREDNISONE 20 MG PO TABS: 20.0000 mg | ORAL_TABLET | Freq: Every day | ORAL | 0 refills | Status: DC

## 2017-06-04 NOTE — Assessment & Plan Note (Addendum)
This is likely due to cancer I recommend prednisone therapy

## 2017-06-04 NOTE — Assessment & Plan Note (Signed)
She has significant disease progression with anorexia, weight loss and progressive lymphocytosis We have reviewed previous imaging study carefully I recommend trial of bendamustine as a single agent to be given's on day 1, 15 for cycle of every 28 days We discussed briefly the risks, benefits, side effects of treatment including risk of pancytopenia, infection, nausea and constipation and she agreed to proceed Treatment is strictly palliative in nature.

## 2017-06-04 NOTE — Assessment & Plan Note (Signed)
The patient has generalized deconditioning I spent a lot of time encouraging the patient and tries to view her outcome in a positive manner While the treatment is not curative, she will likely respond to treatment with preserved quality of life

## 2017-06-04 NOTE — Progress Notes (Signed)
[place] OFFICE PROGRESS NOTE  Patient Care Team: Koirala, Dibas, MD as PCP - General (Family Medicine)  ASSESSMENT & PLAN:  CLL (chronic lymphocytic leukemia) ([place]) She has significant disease progression with anorexia, weight loss and progressive lymphocytosis We have reviewed previous imaging study carefully I recommend trial of bendamustine as a single agent to be given's on day 1, 15 for cycle of every 28 days We discussed briefly the risks, benefits, side effects of treatment including risk of pancytopenia, infection, nausea and constipation and she agreed to proceed Treatment is strictly palliative in nature.  Abdominal pain This is likely due to cancer I recommend prednisone therapy  Chronic kidney disease, stage III (moderate) Creatinine is stable.  Monitor carefully  Malignant cachexia ([place]) I plan her to start her on low-dose prednisone therapy to stimulate appetite We will start with prednisone 20 mg daily with food  Goals of care, counseling/discussion While the treatment is not curable, I expect she could have preserve quality of life with single agent bendamustine I tried to encourage the patient to take the treatment  INTERVAL HISTORY: Please see below for problem oriented charting. She returns with her daughter, [name], for further evaluation She is noted to have progressive lymphadenopathy especially in the neck Her appetite is very poor and she has lost 5 pounds since the last time I saw her There were no recent infection No nausea or vomiting She has mild chronic constipation There were no recent report of fall The patient is clinically depressed but denies suicidal ideation  SUMMARY OF ONCOLOGIC HISTORY:   CLL (chronic lymphocytic leukemia) ([place])   05/02/2013 Initial Diagnosis    She was found to have abnormal CBC from at least since 2015. According to the records from her primary care doctor that were faxed to the [facility], she  had leukocytosis at least since January 2015. On 05/02/2013, total white blood cell count was 28.3, normal hemoglobin 13.4 and platelet count 239,000. She had predominantly Lymphocytosis. On 06/20/2013, a CBC show white count of 26.7, hemoglobin 12.9 and platelet count of 299,000 On admission, on 05/08/2016, white blood cell count was 38.4, hemoglobin 12.1 and platelet count of 210. Differential show predominantly lymphocytosis.      05/08/2016 Imaging    CT scan of chest 1. Negative for acute pulmonary embolism 2. Numerous pathologic nodes in the mediastinum, hila, axillae, supraclavicular regions and upper abdomen. The adenopathy is suspicious for malignancy such as lymphoma or CLL. If tissue diagnosis is desired, many of the supraclavicular and axillary nodes should be palpable and accessible to excisional biopsy. 3. Low-attenuation liver lesions, incompletely imaged and not characterized but more likely benign. 4. Small hiatal hernia.      09/22/2016 Pathology Results    Peripheral Blood Flow Cytometry - CHRONIC LYMPHOCYTIC LEUKEMIA. - SEE COMMENT. Diagnosis Comment: There is a population of monoclonal B-cells with expression of CD5 and CD23. The phenotype is consistent with chronic lymphocytic leukemia      09/22/2016 Pathology Results    FISH confirmed trisomy 12      10/21/2016 PET scan    1. Extensive adenopathy in the neck, chest, abdomen, and pelvis as detailed above, primarily Deauville 3 and Deauville 4. Mild splenomegaly. 2. High activity in some soft tissue prominence along the anal region, possibly physiologic or due to hemorrhoidal activity or urinary incontinence, but correlation with anorectal exam recommended. 3. Focal abnormal accentuated high activity in the sigmoid colon along a region of abnormal stranding and diverticulosis, suspicious  for active diverticulitis. Correlation with the patient's colon cancer screening history is recommended. If screening is not  up-to-date, appropriate screening should be considered. 4. Aortic Atherosclerosis (ICD10-I70.0). Coronary atherosclerosis. 5. An exophytic high density lesion from the right kidney upper pole does not appear to be hypermetabolic and accordingly is most likely to be a benign complex cyst, but this lesion is not fully characterized on today' s exam. If the patient has hematuria or if otherwise clinically warranted, renal protocol MRI or CT examination could be utilized. 6. Some of the smaller hepatic lesions are nonspecific, but the larger hepatic lesions are photopenic and likely cysts.      10/23/2016 Procedure    Placement of single lumen port a cath via right internal jugular vein. The catheter tip lies at the cavo-atrial junction. A power injectable port a cath was placed and is ready for immediate use      10/29/2016 - 10/30/2016 Hospital Admission    She was admitted after allergic reaction to Rituximab      10/29/2016 Adverse Reaction    She received Rituximab complicated by allergic reaction. Treatment was abandoned      11/20/2016 - 04/28/2017 Chemotherapy    The patient is started on chlorambucil and low dose prednisone      04/29/2017 - 05/01/2017 Hospital Admission    She was admitted to the hospital for management of abdominal pain and weakness.      04/30/2017 Imaging    1. Mixed appearance with regard to the extensive nodal disease in the chest, abdomen, and pelvis. Areas of improved adenopathy include the supraclavicular, axillary, and periaortic adenopathy. Areas of worsening include the subcarinal and right inguinal adenopathy. Some of the nodes are very similar to their size on prior PET-CT from 10/21/2016. 2. There is advanced sigmoid diverticulosis and just cephalad to this, there is a region of nodularity and mesenteric infiltration which has progressed compared to 10/21/2016. This may well represent an infiltrative component of malignancy rather than necessarily being due  to chronic diverticulitis. No extraluminal gas is identified. No abscess noted. 3. Upper normal splenic size. There are several hypodense nonspecific splenic lesions. These were not readily appreciable on prior noncontrast images, but no associated hypermetabolic activity was previously seen. 4. Hyperdense 1.9 cm right kidney upper pole lesion is not entirely specific. This is probably a complex cyst. Attention on follow up studies suggested. 5. Transverse sclerosis in the sternal body compatible with late phase healing fracture. This was not present on the prior PET-CT from 10/21/2016. Reviewing the patient's records, she apparently received CPR after an anaphylactic reaction to chemotherapy on or around 10/29/2016, and it is possible (although speculative) that this represents residua from sternal trauma during CPR. Correlate with any extended soreness in the chest after that episode. 6. Other imaging findings of potential clinical significance: Aortic Atherosclerosis (ICD10-I70.0). Coronary atherosclerosis. Airway thickening is present, suggesting bronchitis or reactive airways disease. Scattered hepatic cysts. Multilevel lumbar impingement due to spondylosis and degenerative disc disease.        REVIEW OF SYSTEMS:   Constitutional: Denies fevers, chills  Eyes: Denies blurriness of vision Ears, nose, mouth, throat, and face: Denies mucositis or sore throat Respiratory: Denies cough, dyspnea or wheezes Cardiovascular: Denies palpitation, chest discomfort or lower extremity swelling Gastrointestinal:  Denies nausea, heartburn or change in bowel habits Skin: Denies abnormal skin rashes Neurological:Denies numbness, tingling or new weaknesses Behavioral/Psych: Mood is stable, no new changes  All other systems were reviewed with the patient and are  negative.  I have reviewed the past medical history, past surgical history, social history and family history with the patient and they are  unchanged from previous note.  ALLERGIES:  is allergic to rituximab.  MEDICATIONS:  Current Outpatient Medications  Medication Sig Dispense Refill  . acetaminophen (TYLENOL) 500 MG tablet Take 650 mg by mouth daily as needed for mild pain or headache.     Marland Kitchen acyclovir (ZOVIRAX) 400 MG tablet Take 1 tablet (400 mg total) by mouth daily. 30 tablet 3  . aspirin EC 81 MG EC tablet Take 1 tablet (81 mg total) by mouth daily. 30 tablet 2  . COMBIVENT RESPIMAT 20-100 MCG/ACT AERS respimat Take 2 puffs by mouth every 6 (six) hours as needed for wheezing or shortness of breath.     . isosorbide mononitrate (IMDUR) 30 MG 24 hr tablet Take 30 mg by mouth daily.     Marland Kitchen lidocaine-prilocaine (EMLA) cream Apply 1 application topically as needed. (Patient taking differently: Apply 1 application topically as needed (For port-a-cath.). ) 30 g 6  . lovastatin (MEVACOR) 40 MG tablet Take 40 mg by mouth at bedtime.     . metoprolol tartrate (LOPRESSOR) 25 MG tablet Take 1 tablet (25 mg total) by mouth 2 (two) times daily. 60 tablet 2  . Nutritional Supplements (ENSURE PO) Take 1 Can by mouth daily.    Marland Kitchen omeprazole (PRILOSEC) 20 MG capsule Take 1 capsule (20 mg total) by mouth daily. 30 capsule 11  . ondansetron (ZOFRAN) 8 MG tablet Take 1 tablet (8 mg total) by mouth 2 (two) times daily as needed for refractory nausea / vomiting. Start on day 3 after chemotherapy. 30 tablet 1  . potassium chloride (K-DUR) 10 MEQ tablet Take 10 mEq by mouth daily.    . predniSONE (DELTASONE) 20 MG tablet Take 1 tablet (20 mg total) by mouth daily with breakfast. 30 tablet 0  . prochlorperazine (COMPAZINE) 10 MG tablet Take 1 tablet (10 mg total) by mouth every 6 (six) hours as needed (Nausea or vomiting). 30 tablet 1  . QUEtiapine (SEROQUEL) 25 MG tablet Take 12.5 mg by mouth at bedtime.     . sertraline (ZOLOFT) 50 MG tablet Take 50 mg by mouth daily.  6  . triamterene-hydrochlorothiazide (MAXZIDE-25) 37.5-25 MG tablet Take 0.5  tablets by mouth daily. 30 tablet 2   No current facility-administered medications for this visit.     PHYSICAL EXAMINATION: ECOG PERFORMANCE STATUS: 2 - Symptomatic, <50% confined to bed  Vitals:   06/04/17 1410  BP: (!) 146/72  Pulse: 84  Resp: 18  Temp: 97.7 F (36.5 C)  SpO2: 97%   Filed Weights   06/04/17 1410  Weight: 138 lb 6.4 oz (62.8 kg)    GENERAL:alert, no distress and comfortable.  She looks thin SKIN: skin color, texture, turgor are normal, no rashes or significant lesions EYES: normal, Conjunctiva are pink and non-injected, sclera clear OROPHARYNX:no exudate, no erythema and lips, buccal mucosa, and tongue normal  NECK: supple, thyroid normal size, non-tender, without nodularity LYMPH: Noted progressive lymphadenopathy in her axilla and the neck LUNGS: clear to auscultation and percussion with normal breathing effort HEART: regular rate & rhythm and no murmurs and no lower extremity edema ABDOMEN:abdomen soft, non-tender and normal bowel sounds Musculoskeletal:no cyanosis of digits and no clubbing  NEURO: alert & oriented x 3 with fluent speech, no focal motor/sensory deficits.  She has poor hearing  LABORATORY DATA:  I have reviewed the data as listed  Component Value Date/Time   NA 139 06/04/2017 1327   NA 138 02/17/2017 0810   K 4.0 06/04/2017 1327   K 3.7 02/17/2017 0810   CL 101 06/04/2017 1327   CO2 27 06/04/2017 1327   CO2 27 02/17/2017 0810   GLUCOSE 101 06/04/2017 1327   GLUCOSE 92 02/17/2017 0810   BUN 25 06/04/2017 1327   BUN 16.4 02/17/2017 0810   CREATININE 1.16 (H) 06/04/2017 1327   CREATININE 1.1 02/17/2017 0810   CALCIUM 9.9 06/04/2017 1327   CALCIUM 9.3 02/17/2017 0810   PROT 6.9 06/04/2017 1327   PROT 6.4 02/17/2017 0810   ALBUMIN 4.1 06/04/2017 1327   ALBUMIN 3.9 02/17/2017 0810   AST 17 06/04/2017 1327   AST 13 02/17/2017 0810   ALT 13 06/04/2017 1327   ALT 13 02/17/2017 0810   ALKPHOS 86 06/04/2017 1327   ALKPHOS 74  02/17/2017 0810   BILITOT 0.5 06/04/2017 1327   BILITOT 0.60 02/17/2017 0810   GFRNONAA 41 (L) 06/04/2017 1327   GFRAA 47 (L) 06/04/2017 1327    No results found for: SPEP, UPEP  Lab Results  Component Value Date   WBC 17.9 (H) 06/04/2017   NEUTROABS 1.2 (L) 06/04/2017   HGB 13.1 06/04/2017   HCT 40.4 06/04/2017   MCV 88.1 06/04/2017   PLT 202 06/04/2017      Chemistry      Component Value Date/Time   NA 139 06/04/2017 1327   NA 138 02/17/2017 0810   K 4.0 06/04/2017 1327   K 3.7 02/17/2017 0810   CL 101 06/04/2017 1327   CO2 27 06/04/2017 1327   CO2 27 02/17/2017 0810   BUN 25 06/04/2017 1327   BUN 16.4 02/17/2017 0810   CREATININE 1.16 (H) 06/04/2017 1327   CREATININE 1.1 02/17/2017 0810      Component Value Date/Time   CALCIUM 9.9 06/04/2017 1327   CALCIUM 9.3 02/17/2017 0810   ALKPHOS 86 06/04/2017 1327   ALKPHOS 74 02/17/2017 0810   AST 17 06/04/2017 1327   AST 13 02/17/2017 0810   ALT 13 06/04/2017 1327   ALT 13 02/17/2017 0810   BILITOT 0.5 06/04/2017 1327   BILITOT 0.60 02/17/2017 0810       RADIOGRAPHIC STUDIES: I have reviewed recent CT imaging with the patient and her daughter I have personally reviewed the radiological images as listed and agreed with the findings in the report. Dg Chest 2 View  Result Date: 05/13/2017 CLINICAL DATA:  Chest pain for 2 days which is worse with inspiration. Cough. EXAM: CHEST  2 VIEW COMPARISON:  CT chest, abdomen and pelvis 04/30/2017. Single-view of the chest 10/29/2016. FINDINGS: There is mild linear atelectasis in the lung bases. The lungs are otherwise clear. Heart size is normal. No pneumothorax or pleural effusion. Aortic atherosclerosis is noted. Port-A-Cath is in place. IMPRESSION: No acute disease.  Mild bibasilar atelectasis. Electronically Signed   By: Inge Rise M.D.   On: 05/13/2017 15:39    All questions were answered. The patient knows to call the clinic with any problems, questions or concerns.  No barriers to learning was detected.  I spent 30 minutes counseling the patient face to face. The total time spent in the appointment was 40 minutes and more than 50% was on counseling and review of test results  Heath Lark, MD 06/04/2017 2:34 PM

## 2017-06-04 NOTE — Assessment & Plan Note (Signed)
This is due to disease progression I plan to start her on prednisone therapy

## 2017-06-04 NOTE — Assessment & Plan Note (Signed)
This is stable We will monitor carefully

## 2017-06-04 NOTE — Assessment & Plan Note (Signed)
Creatinine is stable.  Monitor carefully

## 2017-06-04 NOTE — Assessment & Plan Note (Signed)
I plan her to start her on low-dose prednisone therapy to stimulate appetite We will start with prednisone 20 mg daily with food

## 2017-06-04 NOTE — Assessment & Plan Note (Addendum)
She has signs and symptoms of disease progression with anorexia, abdominal pain and further weight loss The patient has fear of allergic reaction due to previous unpleasant experience with rituximab I have discontinue previous plan and institute a new plan using bendamustine as a single agent to be given every other week on days 1 and 15 of a cycle of every 28 days I discussed briefly with the patient and her daughter the risk, benefits, side effects of treatment including mouth pancytopenia, nausea and constipation and ultimately, she agrees to proceed I plan to see her back again in 2 weeks before the start of treatment I recommend starting her on prednisone 20 mg daily to stabilize the disease I will monitor for tumor lysis syndrome carefully I will start her on prophylactic treatment with acyclovir

## 2017-06-04 NOTE — Assessment & Plan Note (Signed)
While the treatment is not curable, I expect she could have preserve quality of life with single agent bendamustine I tried to encourage the patient to take the treatment

## 2017-06-16 ENCOUNTER — Inpatient Hospital Stay: Payer: Medicare Other | Attending: Hematology and Oncology

## 2017-06-16 ENCOUNTER — Inpatient Hospital Stay (HOSPITAL_BASED_OUTPATIENT_CLINIC_OR_DEPARTMENT_OTHER): Payer: Medicare Other | Admitting: Hematology and Oncology

## 2017-06-16 ENCOUNTER — Inpatient Hospital Stay: Payer: Medicare Other

## 2017-06-16 ENCOUNTER — Encounter: Payer: Self-pay | Admitting: Hematology and Oncology

## 2017-06-16 DIAGNOSIS — N183 Chronic kidney disease, stage 3 unspecified: Secondary | ICD-10-CM

## 2017-06-16 DIAGNOSIS — Z79899 Other long term (current) drug therapy: Secondary | ICD-10-CM | POA: Diagnosis not present

## 2017-06-16 DIAGNOSIS — C8300 Small cell B-cell lymphoma, unspecified site: Secondary | ICD-10-CM

## 2017-06-16 DIAGNOSIS — R64 Cachexia: Secondary | ICD-10-CM | POA: Insufficient documentation

## 2017-06-16 DIAGNOSIS — C9110 Chronic lymphocytic leukemia of B-cell type not having achieved remission: Secondary | ICD-10-CM

## 2017-06-16 DIAGNOSIS — E876 Hypokalemia: Secondary | ICD-10-CM | POA: Diagnosis not present

## 2017-06-16 DIAGNOSIS — D709 Neutropenia, unspecified: Secondary | ICD-10-CM | POA: Insufficient documentation

## 2017-06-16 DIAGNOSIS — I10 Essential (primary) hypertension: Secondary | ICD-10-CM

## 2017-06-16 DIAGNOSIS — Z5111 Encounter for antineoplastic chemotherapy: Secondary | ICD-10-CM | POA: Insufficient documentation

## 2017-06-16 DIAGNOSIS — F419 Anxiety disorder, unspecified: Secondary | ICD-10-CM | POA: Diagnosis not present

## 2017-06-16 DIAGNOSIS — I129 Hypertensive chronic kidney disease with stage 1 through stage 4 chronic kidney disease, or unspecified chronic kidney disease: Secondary | ICD-10-CM | POA: Insufficient documentation

## 2017-06-16 DIAGNOSIS — Z95828 Presence of other vascular implants and grafts: Secondary | ICD-10-CM

## 2017-06-16 DIAGNOSIS — D708 Other neutropenia: Secondary | ICD-10-CM

## 2017-06-16 LAB — COMPREHENSIVE METABOLIC PANEL
ALT: 23 U/L (ref 0–55)
AST: 17 U/L (ref 5–34)
Albumin: 3.7 g/dL (ref 3.5–5.0)
Alkaline Phosphatase: 58 U/L (ref 40–150)
Anion gap: 9 (ref 3–11)
BUN: 26 mg/dL (ref 7–26)
CALCIUM: 9 mg/dL (ref 8.4–10.4)
CO2: 29 mmol/L (ref 22–29)
CREATININE: 1.06 mg/dL (ref 0.60–1.10)
Chloride: 99 mmol/L (ref 98–109)
GFR, EST AFRICAN AMERICAN: 53 mL/min — AB (ref >60)
GFR, EST NON AFRICAN AMERICAN: 45 mL/min — AB (ref >60)
Glucose, Bld: 88 mg/dL (ref 70–140)
Potassium: 3.6 mmol/L (ref 3.5–5.1)
Sodium: 137 mmol/L (ref 136–145)
Total Bilirubin: 0.3 mg/dL (ref 0.2–1.2)
Total Protein: 6 g/dL — ABNORMAL LOW (ref 6.4–8.3)

## 2017-06-16 LAB — CBC WITH DIFFERENTIAL/PLATELET
Basophils Absolute: 0.1 10*3/uL (ref 0.0–0.1)
Basophils Relative: 0 %
Eosinophils Absolute: 0.2 10*3/uL (ref 0.0–0.5)
Eosinophils Relative: 1 %
HCT: 38.4 % (ref 34.8–46.6)
Hemoglobin: 12.3 g/dL (ref 11.6–15.9)
Lymphocytes Relative: 95 %
Lymphs Abs: 25.9 10*3/uL — ABNORMAL HIGH (ref 0.9–3.3)
MCH: 28.3 pg (ref 25.1–34.0)
MCHC: 32.1 g/dL (ref 31.5–36.0)
MCV: 88.1 fL (ref 79.5–101.0)
Monocytes Absolute: 0.2 10*3/uL (ref 0.1–0.9)
Monocytes Relative: 1 %
Neutro Abs: 0.9 10*3/uL — ABNORMAL LOW (ref 1.5–6.5)
Neutrophils Relative %: 3 %
Platelets: 223 10*3/uL (ref 145–400)
RBC: 4.36 MIL/uL (ref 3.70–5.45)
RDW: 16.1 % — ABNORMAL HIGH (ref 11.2–14.5)
WBC: 27.3 10*3/uL — ABNORMAL HIGH (ref 3.9–10.3)

## 2017-06-16 LAB — LACTATE DEHYDROGENASE: LDH: 122 U/L — ABNORMAL LOW (ref 125–245)

## 2017-06-16 LAB — URIC ACID: Uric Acid, Serum: 4.2 mg/dL (ref 2.6–7.4)

## 2017-06-16 MED ORDER — SODIUM CHLORIDE 0.9 % IV SOLN
45.0000 mg/m2 | Freq: Once | INTRAVENOUS | Status: AC
  Administered 2017-06-16: 75 mg via INTRAVENOUS
  Filled 2017-06-16: qty 3

## 2017-06-16 MED ORDER — SODIUM CHLORIDE 0.9% FLUSH
10.0000 mL | INTRAVENOUS | Status: DC | PRN
  Administered 2017-06-16: 10 mL
  Filled 2017-06-16: qty 10

## 2017-06-16 MED ORDER — SODIUM CHLORIDE 0.9 % IV SOLN
Freq: Once | INTRAVENOUS | Status: AC
  Administered 2017-06-16: 14:00:00 via INTRAVENOUS

## 2017-06-16 MED ORDER — PALONOSETRON HCL INJECTION 0.25 MG/5ML
0.2500 mg | Freq: Once | INTRAVENOUS | Status: AC
  Administered 2017-06-16: 0.25 mg via INTRAVENOUS

## 2017-06-16 MED ORDER — DEXAMETHASONE SODIUM PHOSPHATE 10 MG/ML IJ SOLN
INTRAMUSCULAR | Status: AC
  Filled 2017-06-16: qty 1

## 2017-06-16 MED ORDER — SODIUM CHLORIDE 0.9% FLUSH
10.0000 mL | Freq: Once | INTRAVENOUS | Status: AC
  Administered 2017-06-16: 10 mL
  Filled 2017-06-16: qty 10

## 2017-06-16 MED ORDER — HEPARIN SOD (PORK) LOCK FLUSH 100 UNIT/ML IV SOLN
500.0000 [IU] | Freq: Once | INTRAVENOUS | Status: AC | PRN
  Administered 2017-06-16: 500 [IU]
  Filled 2017-06-16: qty 5

## 2017-06-16 MED ORDER — SODIUM CHLORIDE 0.9 % IV SOLN: 90.0000 mg/m2 | Freq: Once | INTRAVENOUS | Status: DC

## 2017-06-16 MED ORDER — PALONOSETRON HCL INJECTION 0.25 MG/5ML
INTRAVENOUS | Status: AC
  Filled 2017-06-16: qty 5

## 2017-06-16 MED ORDER — DEXAMETHASONE SODIUM PHOSPHATE 10 MG/ML IJ SOLN
10.0000 mg | Freq: Once | INTRAMUSCULAR | Status: AC
  Administered 2017-06-16: 10 mg via INTRAVENOUS

## 2017-06-16 MED ORDER — SODIUM CHLORIDE 0.9 % IV SOLN: 45.0000 mg/m2 | Freq: Once | INTRAVENOUS | Status: DC

## 2017-06-16 NOTE — Assessment & Plan Note (Signed)
Her serum creatinine is stable We will continue reduced dose treatment and monitor carefully

## 2017-06-16 NOTE — Patient Instructions (Signed)
[facility] Discharge Instructions for Patients Receiving Chemotherapy  Today you received the following chemotherapy agents [place].  To help prevent nausea and vomiting after your treatment, we encourage you to take your nausea medication as directed.   If you develop nausea and vomiting that is not controlled by your nausea medication, call the clinic.   BELOW ARE SYMPTOMS THAT SHOULD BE REPORTED IMMEDIATELY:  *FEVER GREATER THAN 100.5 F  *CHILLS WITH OR WITHOUT FEVER  NAUSEA AND VOMITING THAT IS NOT CONTROLLED WITH YOUR NAUSEA MEDICATION  *UNUSUAL SHORTNESS OF BREATH  *UNUSUAL BRUISING OR BLEEDING  TENDERNESS IN MOUTH AND THROAT WITH OR WITHOUT PRESENCE OF ULCERS  *URINARY PROBLEMS  *BOWEL PROBLEMS  UNUSUAL RASH Items with * indicate a potential emergency and should be followed up as soon as possible.  Feel free to call the clinic should you have any questions or concerns. The clinic phone number is [phone].  Please show the CHEMO ALERT CARD at check-in to the Emergency Department and triage nurse.   

## 2017-06-16 NOTE — Assessment & Plan Note (Signed)
She has poorly controlled hypertension due to anxiety We will continue close monitoring for now

## 2017-06-16 NOTE — Progress Notes (Signed)
Pt CrCl = 33 mL/min today.  I s/w Dr. Alvy Bimler and she'd like Bendamustine dose reduced by 50%. Orders changed accordingly. Kennith Center, Pharm.D., CPP 06/16/2017@2 :16 PM

## 2017-06-16 NOTE — Assessment & Plan Note (Signed)
She is doing well since the introduction of prednisone She has no side effects from prednisone and is gaining weight I recommend we continue at 20 mg daily for now with plan for slow taper in the future

## 2017-06-16 NOTE — Assessment & Plan Note (Signed)
She has mild persistent neutropenia due to active disease Will observe closely for now We will proceed with treatment with dose adjustment as above

## 2017-06-16 NOTE — Assessment & Plan Note (Signed)
The patient is very frail. She felt better since I put her on 20 mg of prednisone daily She has persistent disease and is neutropenic We have numerous discussions about goals of care and different plan of treatment Ultimately, the patient is in agreement to proceed with bendamustine. I will start with 50% dose adjustment for cycle 1 I will see her back in 2 weeks before day 15 of treatment

## 2017-06-16 NOTE — Progress Notes (Signed)
[place] OFFICE PROGRESS NOTE  Patient Care Team: Lujean Amel, MD as PCP - General (Family Medicine)  ASSESSMENT & PLAN:  CLL (chronic lymphocytic leukemia) ([place]) The patient is very frail. She felt better since I put her on 20 mg of prednisone daily She has persistent disease and is neutropenic We have numerous discussions about goals of care and different plan of treatment Ultimately, the patient is in agreement to proceed with bendamustine. I will start with 50% dose adjustment for cycle 1 I will see her back in 2 weeks before day 15 of treatment  Neutropenia ([place]) She has mild persistent neutropenia due to active disease Will observe closely for now We will proceed with treatment with dose adjustment as above  Chronic kidney disease, stage III (moderate) Her serum creatinine is stable We will continue reduced dose treatment and monitor carefully  HTN (hypertension) She has poorly controlled hypertension due to anxiety We will continue close monitoring for now  Malignant cachexia [facility]) She is doing well since the introduction of prednisone She has no side effects from prednisone and is gaining weight I recommend we continue at 20 mg daily for now with plan for slow taper in the future   No orders of the defined types were placed in this encounter.   INTERVAL HISTORY: Please see below for problem oriented charting. She is seen today, accompanied by her daughter The patient is very anxious about plan for chemotherapy treatment She felt a little better since I started her on prednisone 2 weeks ago She is eating better and has gained some weight The size of lymph node is smaller She denies recent infection, fever or chills She denies abdominal pain, nausea or constipation  SUMMARY OF ONCOLOGIC HISTORY:   CLL (chronic lymphocytic leukemia) ([place])   05/02/2013 Initial Diagnosis    She was found to have abnormal CBC from at least since  2015. According to the records from her primary care doctor that were faxed to the [facility], she had leukocytosis at least since January 2015. On 05/02/2013, total white blood cell count was 28.3, normal hemoglobin 13.4 and platelet count 239,000. She had predominantly Lymphocytosis. On 06/20/2013, a CBC show white count of 26.7, hemoglobin 12.9 and platelet count of 299,000 On admission, on 05/08/2016, white blood cell count was 38.4, hemoglobin 12.1 and platelet count of 210. Differential show predominantly lymphocytosis.      05/08/2016 Imaging    CT scan of chest 1. Negative for acute pulmonary embolism 2. Numerous pathologic nodes in the mediastinum, hila, axillae, supraclavicular regions and upper abdomen. The adenopathy is suspicious for malignancy such as lymphoma or CLL. If tissue diagnosis is desired, many of the supraclavicular and axillary nodes should be palpable and accessible to excisional biopsy. 3. Low-attenuation liver lesions, incompletely imaged and not characterized but more likely benign. 4. Small hiatal hernia.      09/22/2016 Pathology Results    Peripheral Blood Flow Cytometry - CHRONIC LYMPHOCYTIC LEUKEMIA. - SEE COMMENT. Diagnosis Comment: There is a population of monoclonal B-cells with expression of CD5 and CD23. The phenotype is consistent with chronic lymphocytic leukemia      09/22/2016 Pathology Results    FISH confirmed trisomy 12      10/21/2016 PET scan    1. Extensive adenopathy in the neck, chest, abdomen, and pelvis as detailed above, primarily Deauville 3 and Deauville 4. Mild splenomegaly. 2. High activity in some soft tissue prominence along the anal region, possibly physiologic or due to  hemorrhoidal activity or urinary incontinence, but correlation with anorectal exam recommended. 3. Focal abnormal accentuated high activity in the sigmoid colon along a region of abnormal stranding and diverticulosis, suspicious for active diverticulitis.  Correlation with the patient's colon cancer screening history is recommended. If screening is not up-to-date, appropriate screening should be considered. 4. Aortic Atherosclerosis (ICD10-I70.0). Coronary atherosclerosis. 5. An exophytic high density lesion from the right kidney upper pole does not appear to be hypermetabolic and accordingly is most likely to be a benign complex cyst, but this lesion is not fully characterized on today' s exam. If the patient has hematuria or if otherwise clinically warranted, renal protocol MRI or CT examination could be utilized. 6. Some of the smaller hepatic lesions are nonspecific, but the larger hepatic lesions are photopenic and likely cysts.      10/23/2016 Procedure    Placement of single lumen port a cath via right internal jugular vein. The catheter tip lies at the cavo-atrial junction. A power injectable port a cath was placed and is ready for immediate use      10/29/2016 - 10/30/2016 Hospital Admission    She was admitted after allergic reaction to Rituximab      10/29/2016 Adverse Reaction    She received Rituximab complicated by allergic reaction. Treatment was abandoned      11/20/2016 - 04/28/2017 Chemotherapy    The patient is started on chlorambucil and low dose prednisone      04/29/2017 - 05/01/2017 Hospital Admission    She was admitted to the hospital for management of abdominal pain and weakness.      04/30/2017 Imaging    1. Mixed appearance with regard to the extensive nodal disease in the chest, abdomen, and pelvis. Areas of improved adenopathy include the supraclavicular, axillary, and periaortic adenopathy. Areas of worsening include the subcarinal and right inguinal adenopathy. Some of the nodes are very similar to their size on prior PET-CT from 10/21/2016. 2. There is advanced sigmoid diverticulosis and just cephalad to this, there is a region of nodularity and mesenteric infiltration which has progressed compared to 10/21/2016.  This may well represent an infiltrative component of malignancy rather than necessarily being due to chronic diverticulitis. No extraluminal gas is identified. No abscess noted. 3. Upper normal splenic size. There are several hypodense nonspecific splenic lesions. These were not readily appreciable on prior noncontrast images, but no associated hypermetabolic activity was previously seen. 4. Hyperdense 1.9 cm right kidney upper pole lesion is not entirely specific. This is probably a complex cyst. Attention on follow up studies suggested. 5. Transverse sclerosis in the sternal body compatible with late phase healing fracture. This was not present on the prior PET-CT from 10/21/2016. Reviewing the patient's records, she apparently received CPR after an anaphylactic reaction to chemotherapy on or around 10/29/2016, and it is possible (although speculative) that this represents residua from sternal trauma during CPR. Correlate with any extended soreness in the chest after that episode. 6. Other imaging findings of potential clinical significance: Aortic Atherosclerosis (ICD10-I70.0). Coronary atherosclerosis. Airway thickening is present, suggesting bronchitis or reactive airways disease. Scattered hepatic cysts. Multilevel lumbar impingement due to spondylosis and degenerative disc disease.        REVIEW OF SYSTEMS:   Constitutional: Denies fevers, chills or abnormal weight loss Eyes: Denies blurriness of vision Ears, nose, mouth, throat, and face: Denies mucositis or sore throat Respiratory: Denies cough, dyspnea or wheezes Cardiovascular: Denies palpitation, chest discomfort or lower extremity swelling Gastrointestinal:  Denies nausea, heartburn or  change in bowel habits Skin: Denies abnormal skin rashes Neurological:Denies numbness, tingling or new weaknesses Behavioral/Psych: Mood is stable, no new changes  All other systems were reviewed with the patient and are negative.  I have reviewed  the past medical history, past surgical history, social history and family history with the patient and they are unchanged from previous note.  ALLERGIES:  is allergic to rituximab.  MEDICATIONS:  Current Outpatient Medications  Medication Sig Dispense Refill  . acetaminophen (TYLENOL) 500 MG tablet Take 650 mg by mouth daily as needed for mild pain or headache.     Marland Kitchen acyclovir (ZOVIRAX) 400 MG tablet Take 1 tablet (400 mg total) by mouth daily. 30 tablet 3  . aspirin EC 81 MG EC tablet Take 1 tablet (81 mg total) by mouth daily. 30 tablet 2  . COMBIVENT RESPIMAT 20-100 MCG/ACT AERS respimat Take 2 puffs by mouth every 6 (six) hours as needed for wheezing or shortness of breath.     . isosorbide mononitrate (IMDUR) 30 MG 24 hr tablet Take 30 mg by mouth daily.     Marland Kitchen lidocaine-prilocaine (EMLA) cream Apply 1 application topically as needed. (Patient taking differently: Apply 1 application topically as needed (For port-a-cath.). ) 30 g 6  . lovastatin (MEVACOR) 40 MG tablet Take 40 mg by mouth at bedtime.     . metoprolol tartrate (LOPRESSOR) 25 MG tablet Take 1 tablet (25 mg total) by mouth 2 (two) times daily. 60 tablet 2  . Nutritional Supplements (ENSURE PO) Take 1 Can by mouth daily.    Marland Kitchen omeprazole (PRILOSEC) 20 MG capsule Take 1 capsule (20 mg total) by mouth daily. 30 capsule 11  . ondansetron (ZOFRAN) 8 MG tablet Take 1 tablet (8 mg total) by mouth 2 (two) times daily as needed for refractory nausea / vomiting. Start on day 3 after chemotherapy. 30 tablet 1  . potassium chloride (K-DUR) 10 MEQ tablet Take 10 mEq by mouth daily.    . predniSONE (DELTASONE) 20 MG tablet Take 1 tablet (20 mg total) by mouth daily with breakfast. 30 tablet 0  . prochlorperazine (COMPAZINE) 10 MG tablet Take 1 tablet (10 mg total) by mouth every 6 (six) hours as needed (Nausea or vomiting). 30 tablet 1  . QUEtiapine (SEROQUEL) 25 MG tablet Take 12.5 mg by mouth at bedtime.     . sertraline (ZOLOFT) 50 MG  tablet Take 50 mg by mouth daily.  6  . triamterene-hydrochlorothiazide (MAXZIDE-25) 37.5-25 MG tablet Take 0.5 tablets by mouth daily. 30 tablet 2   No current facility-administered medications for this visit.    Facility-Administered Medications Ordered in Other Visits  Medication Dose Route Frequency Provider Last Rate Last Dose  . heparin lock flush 100 unit/mL  500 Units Intracatheter Once PRN Alvy Bimler, Guiliana Shor, MD      . sodium chloride flush (NS) 0.9 % injection 10 mL  10 mL Intracatheter PRN Alvy Bimler, Daneille Desilva, MD        PHYSICAL EXAMINATION: ECOG PERFORMANCE STATUS: 1 - Symptomatic but completely ambulatory  Vitals:   06/16/17 1211  BP: (!) 161/59  Pulse: 62  Resp: 18  Temp: (!) 97.5 F (36.4 C)  SpO2: 97%   Filed Weights   06/16/17 1211  Weight: 140 lb 3.2 oz (63.6 kg)    GENERAL:alert, no distress and comfortable SKIN: skin color, texture, turgor are normal, no rashes or significant lesions EYES: normal, Conjunctiva are pink and non-injected, sclera clear OROPHARYNX:no exudate, no erythema and lips, buccal mucosa, and  tongue normal  NECK: supple, thyroid normal size, non-tender, without nodularity LYMPH: The size of the lymphadenopathy has improved and getting smaller LUNGS: clear to auscultation and percussion with normal breathing effort HEART: regular rate & rhythm and no murmurs and no lower extremity edema ABDOMEN:abdomen soft, non-tender and normal bowel sounds Musculoskeletal:no cyanosis of digits and no clubbing  NEURO: alert & oriented x 3 with fluent speech, no focal motor/sensory deficits  LABORATORY DATA:  I have reviewed the data as listed    Component Value Date/Time   NA 137 06/16/2017 1114   NA 138 02/17/2017 0810   K 3.6 06/16/2017 1114   K 3.7 02/17/2017 0810   CL 99 06/16/2017 1114   CO2 29 06/16/2017 1114   CO2 27 02/17/2017 0810   GLUCOSE 88 06/16/2017 1114   GLUCOSE 92 02/17/2017 0810   BUN 26 06/16/2017 1114   BUN 16.4 02/17/2017 0810    CREATININE 1.06 06/16/2017 1114   CREATININE 1.1 02/17/2017 0810   CALCIUM 9.0 06/16/2017 1114   CALCIUM 9.3 02/17/2017 0810   PROT 6.0 (L) 06/16/2017 1114   PROT 6.4 02/17/2017 0810   ALBUMIN 3.7 06/16/2017 1114   ALBUMIN 3.9 02/17/2017 0810   AST 17 06/16/2017 1114   AST 13 02/17/2017 0810   ALT 23 06/16/2017 1114   ALT 13 02/17/2017 0810   ALKPHOS 58 06/16/2017 1114   ALKPHOS 74 02/17/2017 0810   BILITOT 0.3 06/16/2017 1114   BILITOT 0.60 02/17/2017 0810   GFRNONAA 45 (L) 06/16/2017 1114   GFRAA 53 (L) 06/16/2017 1114    No results found for: SPEP, UPEP  Lab Results  Component Value Date   WBC 27.3 (H) 06/16/2017   NEUTROABS 0.9 (L) 06/16/2017   HGB 12.3 06/16/2017   HCT 38.4 06/16/2017   MCV 88.1 06/16/2017   PLT 223 06/16/2017      Chemistry      Component Value Date/Time   NA 137 06/16/2017 1114   NA 138 02/17/2017 0810   K 3.6 06/16/2017 1114   K 3.7 02/17/2017 0810   CL 99 06/16/2017 1114   CO2 29 06/16/2017 1114   CO2 27 02/17/2017 0810   BUN 26 06/16/2017 1114   BUN 16.4 02/17/2017 0810   CREATININE 1.06 06/16/2017 1114   CREATININE 1.1 02/17/2017 0810      Component Value Date/Time   CALCIUM 9.0 06/16/2017 1114   CALCIUM 9.3 02/17/2017 0810   ALKPHOS 58 06/16/2017 1114   ALKPHOS 74 02/17/2017 0810   AST 17 06/16/2017 1114   AST 13 02/17/2017 0810   ALT 23 06/16/2017 1114   ALT 13 02/17/2017 0810   BILITOT 0.3 06/16/2017 1114   BILITOT 0.60 02/17/2017 0810       All questions were answered. The patient knows to call the clinic with any problems, questions or concerns. No barriers to learning was detected.  I spent 25 minutes counseling the patient face to face. The total time spent in the appointment was 30 minutes and more than 50% was on counseling and review of test results  Heath Lark, MD 06/16/2017 3:39 PM

## 2017-06-16 NOTE — Progress Notes (Signed)
Per Dr. Alvy Bimler, ok to proceed with treatment using CMP from 06/04/2017 and ANC 0.9.

## 2017-06-17 ENCOUNTER — Other Ambulatory Visit: Payer: Self-pay | Admitting: Hematology and Oncology

## 2017-06-30 ENCOUNTER — Encounter: Payer: Self-pay | Admitting: Hematology and Oncology

## 2017-06-30 ENCOUNTER — Inpatient Hospital Stay (HOSPITAL_BASED_OUTPATIENT_CLINIC_OR_DEPARTMENT_OTHER): Payer: Medicare Other | Admitting: Hematology and Oncology

## 2017-06-30 ENCOUNTER — Inpatient Hospital Stay: Payer: Medicare Other

## 2017-06-30 ENCOUNTER — Telehealth: Payer: Self-pay | Admitting: Hematology and Oncology

## 2017-06-30 DIAGNOSIS — Z79899 Other long term (current) drug therapy: Secondary | ICD-10-CM

## 2017-06-30 DIAGNOSIS — I129 Hypertensive chronic kidney disease with stage 1 through stage 4 chronic kidney disease, or unspecified chronic kidney disease: Secondary | ICD-10-CM | POA: Diagnosis not present

## 2017-06-30 DIAGNOSIS — D709 Neutropenia, unspecified: Secondary | ICD-10-CM

## 2017-06-30 DIAGNOSIS — C8300 Small cell B-cell lymphoma, unspecified site: Secondary | ICD-10-CM

## 2017-06-30 DIAGNOSIS — Z5111 Encounter for antineoplastic chemotherapy: Secondary | ICD-10-CM | POA: Diagnosis not present

## 2017-06-30 DIAGNOSIS — C9110 Chronic lymphocytic leukemia of B-cell type not having achieved remission: Secondary | ICD-10-CM

## 2017-06-30 DIAGNOSIS — R64 Cachexia: Secondary | ICD-10-CM

## 2017-06-30 DIAGNOSIS — I10 Essential (primary) hypertension: Secondary | ICD-10-CM

## 2017-06-30 DIAGNOSIS — D708 Other neutropenia: Secondary | ICD-10-CM

## 2017-06-30 DIAGNOSIS — F419 Anxiety disorder, unspecified: Secondary | ICD-10-CM

## 2017-06-30 DIAGNOSIS — E876 Hypokalemia: Secondary | ICD-10-CM | POA: Diagnosis not present

## 2017-06-30 DIAGNOSIS — N183 Chronic kidney disease, stage 3 (moderate): Secondary | ICD-10-CM

## 2017-06-30 DIAGNOSIS — Z95828 Presence of other vascular implants and grafts: Secondary | ICD-10-CM

## 2017-06-30 LAB — COMPREHENSIVE METABOLIC PANEL
ALBUMIN: 3.4 g/dL — AB (ref 3.5–5.0)
ALT: 12 U/L (ref 0–55)
ANION GAP: 8 (ref 3–11)
AST: 10 U/L (ref 5–34)
Alkaline Phosphatase: 40 U/L (ref 40–150)
BILIRUBIN TOTAL: 0.5 mg/dL (ref 0.2–1.2)
BUN: 23 mg/dL (ref 7–26)
CO2: 30 mmol/L — ABNORMAL HIGH (ref 22–29)
Calcium: 9.3 mg/dL (ref 8.4–10.4)
Chloride: 100 mmol/L (ref 98–109)
Creatinine, Ser: 0.93 mg/dL (ref 0.60–1.10)
GFR calc Af Amer: >60 mL/min (ref >60)
GFR, EST NON AFRICAN AMERICAN: 53 mL/min — AB (ref >60)
GLUCOSE: 84 mg/dL (ref 70–140)
Potassium: 3.1 mmol/L — ABNORMAL LOW (ref 3.5–5.1)
Sodium: 138 mmol/L (ref 136–145)
TOTAL PROTEIN: 5.8 g/dL — AB (ref 6.4–8.3)

## 2017-06-30 LAB — CBC WITH DIFFERENTIAL/PLATELET
BASOS PCT: 1 %
Basophils Absolute: 0.1 10*3/uL (ref 0.0–0.1)
Eosinophils Absolute: 0.1 10*3/uL (ref 0.0–0.5)
Eosinophils Relative: 1 %
HEMATOCRIT: 37.8 % (ref 34.8–46.6)
Hemoglobin: 12.3 g/dL (ref 11.6–15.9)
Lymphocytes Relative: 89 %
Lymphs Abs: 10.2 10*3/uL — ABNORMAL HIGH (ref 0.9–3.3)
MCH: 28.7 pg (ref 25.1–34.0)
MCHC: 32.6 g/dL (ref 31.5–36.0)
MCV: 88.1 fL (ref 79.5–101.0)
MONO ABS: 0.2 10*3/uL (ref 0.1–0.9)
Monocytes Relative: 2 %
NEUTROS ABS: 0.7 10*3/uL — AB (ref 1.5–6.5)
Neutrophils Relative %: 7 %
Platelets: 175 10*3/uL (ref 145–400)
RBC: 4.29 MIL/uL (ref 3.70–5.45)
RDW: 16.3 % — ABNORMAL HIGH (ref 11.2–14.5)
WBC: 11.4 10*3/uL — ABNORMAL HIGH (ref 3.9–10.3)

## 2017-06-30 LAB — LACTATE DEHYDROGENASE: LDH: 112 U/L — AB (ref 125–245)

## 2017-06-30 LAB — URIC ACID: URIC ACID, SERUM: 3.6 mg/dL (ref 2.6–7.4)

## 2017-06-30 MED ORDER — SODIUM CHLORIDE 0.9 % IV SOLN
45.0000 mg/m2 | Freq: Once | INTRAVENOUS | Status: AC
  Administered 2017-06-30: 75 mg via INTRAVENOUS
  Filled 2017-06-30: qty 3

## 2017-06-30 MED ORDER — DEXAMETHASONE SODIUM PHOSPHATE 10 MG/ML IJ SOLN
10.0000 mg | Freq: Once | INTRAMUSCULAR | Status: AC
  Administered 2017-06-30: 10 mg via INTRAVENOUS

## 2017-06-30 MED ORDER — PALONOSETRON HCL INJECTION 0.25 MG/5ML
INTRAVENOUS | Status: AC
  Filled 2017-06-30: qty 5

## 2017-06-30 MED ORDER — SODIUM CHLORIDE 0.9 % IV SOLN
Freq: Once | INTRAVENOUS | Status: AC
  Administered 2017-06-30: 12:00:00 via INTRAVENOUS

## 2017-06-30 MED ORDER — SODIUM CHLORIDE 0.9% FLUSH
10.0000 mL | Freq: Once | INTRAVENOUS | Status: AC
  Administered 2017-06-30: 10 mL
  Filled 2017-06-30: qty 10

## 2017-06-30 MED ORDER — DEXAMETHASONE SODIUM PHOSPHATE 10 MG/ML IJ SOLN
INTRAMUSCULAR | Status: AC
  Filled 2017-06-30: qty 1

## 2017-06-30 MED ORDER — HEPARIN SOD (PORK) LOCK FLUSH 100 UNIT/ML IV SOLN
500.0000 [IU] | Freq: Once | INTRAVENOUS | Status: AC | PRN
  Administered 2017-06-30: 500 [IU]
  Filled 2017-06-30: qty 5

## 2017-06-30 MED ORDER — SODIUM CHLORIDE 0.9% FLUSH
10.0000 mL | INTRAVENOUS | Status: DC | PRN
  Administered 2017-06-30: 10 mL
  Filled 2017-06-30: qty 10

## 2017-06-30 MED ORDER — PALONOSETRON HCL INJECTION 0.25 MG/5ML
0.2500 mg | Freq: Once | INTRAVENOUS | Status: AC
  Administered 2017-06-30: 0.25 mg via INTRAVENOUS

## 2017-06-30 NOTE — Telephone Encounter (Signed)
Gave patients daughter avs and calendar of upcoming April appointments.

## 2017-06-30 NOTE — Assessment & Plan Note (Signed)
She has poorly controlled hypertension due to anxiety We will continue close monitoring for now

## 2017-06-30 NOTE — Assessment & Plan Note (Signed)
The cause is unknown but could be due to poor oral intake I recommend potassium rich diet only

## 2017-06-30 NOTE — Progress Notes (Signed)
[facility] OFFICE PROGRESS NOTE  Patient Care Team: Koirala, Dibas, MD as PCP - General (Family Medicine)  ASSESSMENT & PLAN:  CLL (chronic lymphocytic leukemia) ([place]) She tolerated recent chemotherapy well Her appetite is stable and she has not lost any weight Lymphocytosis is improving I recommend we continue on chemotherapy every 2 weeks for at least 3 months before repeat imaging study She will continue low-dose prednisone daily as appetite stimulant and I will consider slow taper in the future  Neutropenia (HCC) She has mild persistent neutropenia due to active disease Will observe closely for now We will proceed with treatment with dose adjustment as above  HTN (hypertension) She has poorly controlled hypertension due to anxiety We will continue close monitoring for now  Hypokalemia The cause is unknown but could be due to poor oral intake I recommend potassium rich diet only  Malignant cachexia (HCC) The prednisone has improve her appetite and her weight is stable We will continue to same with plan for prednisone taper in the future   No orders of the defined types were placed in this encounter.   INTERVAL HISTORY: Please see below for problem oriented charting. She returns with her daughter for further follow-up She tolerated recent chemotherapy well No recent nausea, vomiting or changes in bowel habits She continues to have rare intermittent abdominal pain No recent infection She has noted that the lymph nodes are shrinking  SUMMARY OF ONCOLOGIC HISTORY:   CLL (chronic lymphocytic leukemia) ([place])   05/02/2013 Initial Diagnosis    She was found to have abnormal CBC from at least since 2015. According to the records from her primary care doctor that were faxed to the [facility], she had leukocytosis at least since January 2015. On 05/02/2013, total white blood cell count was 28.3, normal hemoglobin 13.4 and platelet count 239,000. She had  predominantly Lymphocytosis. On 06/20/2013, a CBC show white count of 26.7, hemoglobin 12.9 and platelet count of 299,000 On admission, on 05/08/2016, white blood cell count was 38.4, hemoglobin 12.1 and platelet count of 210. Differential show predominantly lymphocytosis.      05/08/2016 Imaging    CT scan of chest 1. Negative for acute pulmonary embolism 2. Numerous pathologic nodes in the mediastinum, hila, axillae, supraclavicular regions and upper abdomen. The adenopathy is suspicious for malignancy such as lymphoma or CLL. If tissue diagnosis is desired, many of the supraclavicular and axillary nodes should be palpable and accessible to excisional biopsy. 3. Low-attenuation liver lesions, incompletely imaged and not characterized but more likely benign. 4. Small hiatal hernia.      09/22/2016 Pathology Results    Peripheral Blood Flow Cytometry - CHRONIC LYMPHOCYTIC LEUKEMIA. - SEE COMMENT. Diagnosis Comment: There is a population of monoclonal B-cells with expression of CD5 and CD23. The phenotype is consistent with chronic lymphocytic leukemia      09/22/2016 Pathology Results    FISH confirmed trisomy 12      10/21/2016 PET scan    1. Extensive adenopathy in the neck, chest, abdomen, and pelvis as detailed above, primarily Deauville 3 and Deauville 4. Mild splenomegaly. 2. High activity in some soft tissue prominence along the anal region, possibly physiologic or due to hemorrhoidal activity or urinary incontinence, but correlation with anorectal exam recommended. 3. Focal abnormal accentuated high activity in the sigmoid colon along a region of abnormal stranding and diverticulosis, suspicious for active diverticulitis. Correlation with the patient's colon cancer screening history is recommended. If screening is not up-to-date, appropriate screening should  be considered. 4. Aortic Atherosclerosis (ICD10-I70.0). Coronary atherosclerosis. 5. An exophytic high density lesion from  the right kidney upper pole does not appear to be hypermetabolic and accordingly is most likely to be a benign complex cyst, but this lesion is not fully characterized on today' s exam. If the patient has hematuria or if otherwise clinically warranted, renal protocol MRI or CT examination could be utilized. 6. Some of the smaller hepatic lesions are nonspecific, but the larger hepatic lesions are photopenic and likely cysts.      10/23/2016 Procedure    Placement of single lumen port a cath via right internal jugular vein. The catheter tip lies at the cavo-atrial junction. A power injectable port a cath was placed and is ready for immediate use      10/29/2016 - 10/30/2016 Hospital Admission    She was admitted after allergic reaction to Rituximab      10/29/2016 Adverse Reaction    She received Rituximab complicated by allergic reaction. Treatment was abandoned      11/20/2016 - 04/28/2017 Chemotherapy    The patient is started on chlorambucil and low dose prednisone      04/29/2017 - 05/01/2017 Hospital Admission    She was admitted to the hospital for management of abdominal pain and weakness.      04/30/2017 Imaging    1. Mixed appearance with regard to the extensive nodal disease in the chest, abdomen, and pelvis. Areas of improved adenopathy include the supraclavicular, axillary, and periaortic adenopathy. Areas of worsening include the subcarinal and right inguinal adenopathy. Some of the nodes are very similar to their size on prior PET-CT from 10/21/2016. 2. There is advanced sigmoid diverticulosis and just cephalad to this, there is a region of nodularity and mesenteric infiltration which has progressed compared to 10/21/2016. This may well represent an infiltrative component of malignancy rather than necessarily being due to chronic diverticulitis. No extraluminal gas is identified. No abscess noted. 3. Upper normal splenic size. There are several hypodense nonspecific splenic lesions.  These were not readily appreciable on prior noncontrast images, but no associated hypermetabolic activity was previously seen. 4. Hyperdense 1.9 cm right kidney upper pole lesion is not entirely specific. This is probably a complex cyst. Attention on follow up studies suggested. 5. Transverse sclerosis in the sternal body compatible with late phase healing fracture. This was not present on the prior PET-CT from 10/21/2016. Reviewing the patient's records, she apparently received CPR after an anaphylactic reaction to chemotherapy on or around 10/29/2016, and it is possible (although speculative) that this represents residua from sternal trauma during CPR. Correlate with any extended soreness in the chest after that episode. 6. Other imaging findings of potential clinical significance: Aortic Atherosclerosis (ICD10-I70.0). Coronary atherosclerosis. Airway thickening is present, suggesting bronchitis or reactive airways disease. Scattered hepatic cysts. Multilevel lumbar impingement due to spondylosis and degenerative disc disease.        REVIEW OF SYSTEMS:   Constitutional: Denies fevers, chills or abnormal weight loss Eyes: Denies blurriness of vision Ears, nose, mouth, throat, and face: Denies mucositis or sore throat Respiratory: Denies cough, dyspnea or wheezes Cardiovascular: Denies palpitation, chest discomfort or lower extremity swelling Gastrointestinal:  Denies nausea, heartburn or change in bowel habits Skin: Denies abnormal skin rashes Lymphatics: Denies new lymphadenopathy or easy bruising Neurological:Denies numbness, tingling or new weaknesses Behavioral/Psych: Mood is stable, no new changes  All other systems were reviewed with the patient and are negative.  I have reviewed the past medical history, past surgical  history, social history and family history with the patient and they are unchanged from previous note.  ALLERGIES:  is allergic to rituximab.  MEDICATIONS:  Current  Outpatient Medications  Medication Sig Dispense Refill  . acetaminophen (TYLENOL) 500 MG tablet Take 650 mg by mouth daily as needed for mild pain or headache.     Marland Kitchen acyclovir (ZOVIRAX) 400 MG tablet Take 1 tablet (400 mg total) by mouth daily. 30 tablet 3  . aspirin EC 81 MG EC tablet Take 1 tablet (81 mg total) by mouth daily. 30 tablet 2  . COMBIVENT RESPIMAT 20-100 MCG/ACT AERS respimat Take 2 puffs by mouth every 6 (six) hours as needed for wheezing or shortness of breath.     . isosorbide mononitrate (IMDUR) 30 MG 24 hr tablet Take 30 mg by mouth daily.     Marland Kitchen lidocaine-prilocaine (EMLA) cream Apply 1 application topically as needed. (Patient taking differently: Apply 1 application topically as needed (For port-a-cath.). ) 30 g 6  . lovastatin (MEVACOR) 40 MG tablet Take 40 mg by mouth at bedtime.     . metoprolol tartrate (LOPRESSOR) 25 MG tablet Take 1 tablet (25 mg total) by mouth 2 (two) times daily. 60 tablet 2  . Nutritional Supplements (ENSURE PO) Take 1 Can by mouth daily.    Marland Kitchen omeprazole (PRILOSEC) 20 MG capsule Take 1 capsule (20 mg total) by mouth daily. 30 capsule 11  . ondansetron (ZOFRAN) 8 MG tablet Take 1 tablet (8 mg total) by mouth 2 (two) times daily as needed for refractory nausea / vomiting. Start on day 3 after chemotherapy. 30 tablet 1  . potassium chloride (K-DUR) 10 MEQ tablet Take 10 mEq by mouth daily.    . predniSONE (DELTASONE) 20 MG tablet TAKE 1 TABLET(20 MG) BY MOUTH DAILY WITH BREAKFAST 30 tablet 0  . prochlorperazine (COMPAZINE) 10 MG tablet Take 1 tablet (10 mg total) by mouth every 6 (six) hours as needed (Nausea or vomiting). 30 tablet 1  . QUEtiapine (SEROQUEL) 25 MG tablet Take 12.5 mg by mouth at bedtime.     . sertraline (ZOLOFT) 50 MG tablet Take 50 mg by mouth daily.  6  . triamterene-hydrochlorothiazide (MAXZIDE-25) 37.5-25 MG tablet Take 0.5 tablets by mouth daily. 30 tablet 2   No current facility-administered medications for this visit.     Facility-Administered Medications Ordered in Other Visits  Medication Dose Route Frequency Provider Last Rate Last Dose  . sodium chloride flush (NS) 0.9 % injection 10 mL  10 mL Intracatheter PRN Alvy Bimler, Keyondra Lagrand, MD   10 mL at 06/30/17 1329    PHYSICAL EXAMINATION: ECOG PERFORMANCE STATUS: 1 - Symptomatic but completely ambulatory  Vitals:   06/30/17 1008  BP: (!) 166/66  Pulse: 69  Resp: 18  Temp: 98.3 F (36.8 C)  SpO2: 97%   Filed Weights   06/30/17 1008  Weight: 139 lb 14.4 oz (63.5 kg)    GENERAL:alert, no distress and comfortable SKIN: skin color, texture, turgor are normal, no rashes or significant lesions EYES: normal, Conjunctiva are pink and non-injected, sclera clear OROPHARYNX:no exudate, no erythema and lips, buccal mucosa, and tongue normal  NECK: supple, thyroid normal size, non-tender, without nodularity LYMPH:  no palpable lymphadenopathy in the cervical, axillary or inguinal.  Previously palpable lymphadenopathy had disappeared LUNGS: clear to auscultation and percussion with normal breathing effort HEART: regular rate & rhythm and no murmurs and no lower extremity edema ABDOMEN:abdomen soft, non-tender and normal bowel sounds Musculoskeletal:no cyanosis of digits and no  clubbing  NEURO: alert & oriented x 3 with fluent speech, no focal motor/sensory deficits  LABORATORY DATA:  I have reviewed the data as listed    Component Value Date/Time   NA 138 06/30/2017 0926   NA 138 02/17/2017 0810   K 3.1 (L) 06/30/2017 0926   K 3.7 02/17/2017 0810   CL 100 06/30/2017 0926   CO2 30 (H) 06/30/2017 0926   CO2 27 02/17/2017 0810   GLUCOSE 84 06/30/2017 0926   GLUCOSE 92 02/17/2017 0810   BUN 23 06/30/2017 0926   BUN 16.4 02/17/2017 0810   CREATININE 0.93 06/30/2017 0926   CREATININE 1.1 02/17/2017 0810   CALCIUM 9.3 06/30/2017 0926   CALCIUM 9.3 02/17/2017 0810   PROT 5.8 (L) 06/30/2017 0926   PROT 6.4 02/17/2017 0810   ALBUMIN 3.4 (L) 06/30/2017 0926    ALBUMIN 3.9 02/17/2017 0810   AST 10 06/30/2017 0926   AST 13 02/17/2017 0810   ALT 12 06/30/2017 0926   ALT 13 02/17/2017 0810   ALKPHOS 40 06/30/2017 0926   ALKPHOS 74 02/17/2017 0810   BILITOT 0.5 06/30/2017 0926   BILITOT 0.60 02/17/2017 0810   GFRNONAA 53 (L) 06/30/2017 0926   GFRAA >60 06/30/2017 0926    No results found for: SPEP, UPEP  Lab Results  Component Value Date   WBC 11.4 (H) 06/30/2017   NEUTROABS 0.7 (L) 06/30/2017   HGB 12.3 06/30/2017   HCT 37.8 06/30/2017   MCV 88.1 06/30/2017   PLT 175 06/30/2017      Chemistry      Component Value Date/Time   NA 138 06/30/2017 0926   NA 138 02/17/2017 0810   K 3.1 (L) 06/30/2017 0926   K 3.7 02/17/2017 0810   CL 100 06/30/2017 0926   CO2 30 (H) 06/30/2017 0926   CO2 27 02/17/2017 0810   BUN 23 06/30/2017 0926   BUN 16.4 02/17/2017 0810   CREATININE 0.93 06/30/2017 0926   CREATININE 1.1 02/17/2017 0810      Component Value Date/Time   CALCIUM 9.3 06/30/2017 0926   CALCIUM 9.3 02/17/2017 0810   ALKPHOS 40 06/30/2017 0926   ALKPHOS 74 02/17/2017 0810   AST 10 06/30/2017 0926   AST 13 02/17/2017 0810   ALT 12 06/30/2017 0926   ALT 13 02/17/2017 0810   BILITOT 0.5 06/30/2017 0926   BILITOT 0.60 02/17/2017 0810      All questions were answered. The patient knows to call the clinic with any problems, questions or concerns. No barriers to learning was detected.  I spent 25 minutes counseling the patient face to face. The total time spent in the appointment was 30 minutes and more than 50% was on counseling and review of test results  Heath Lark, MD 06/30/2017 4:14 PM

## 2017-06-30 NOTE — Patient Instructions (Signed)
[facility] Discharge Instructions for Patients Receiving Chemotherapy  Today you received the following chemotherapy agents [place].  To help prevent nausea and vomiting after your treatment, we encourage you to take your nausea medication as directed.   If you develop nausea and vomiting that is not controlled by your nausea medication, call the clinic.   BELOW ARE SYMPTOMS THAT SHOULD BE REPORTED IMMEDIATELY:  *FEVER GREATER THAN 100.5 F  *CHILLS WITH OR WITHOUT FEVER  NAUSEA AND VOMITING THAT IS NOT CONTROLLED WITH YOUR NAUSEA MEDICATION  *UNUSUAL SHORTNESS OF BREATH  *UNUSUAL BRUISING OR BLEEDING  TENDERNESS IN MOUTH AND THROAT WITH OR WITHOUT PRESENCE OF ULCERS  *URINARY PROBLEMS  *BOWEL PROBLEMS  UNUSUAL RASH Items with * indicate a potential emergency and should be followed up as soon as possible.  Feel free to call the clinic should you have any questions or concerns. The clinic phone number is [phone].  Please show the CHEMO ALERT CARD at check-in to the Emergency Department and triage nurse.   

## 2017-06-30 NOTE — Assessment & Plan Note (Signed)
She has mild persistent neutropenia due to active disease Will observe closely for now We will proceed with treatment with dose adjustment as above

## 2017-06-30 NOTE — Assessment & Plan Note (Signed)
The prednisone has improve her appetite and her weight is stable We will continue to same with plan for prednisone taper in the future

## 2017-06-30 NOTE — Assessment & Plan Note (Signed)
She tolerated recent chemotherapy well Her appetite is stable and she has not lost any weight Lymphocytosis is improving I recommend we continue on chemotherapy every 2 weeks for at least 3 months before repeat imaging study She will continue low-dose prednisone daily as appetite stimulant and I will consider slow taper in the future

## 2017-07-14 ENCOUNTER — Inpatient Hospital Stay (HOSPITAL_BASED_OUTPATIENT_CLINIC_OR_DEPARTMENT_OTHER): Payer: Medicare Other | Admitting: Hematology and Oncology

## 2017-07-14 ENCOUNTER — Inpatient Hospital Stay: Payer: Medicare Other | Attending: Hematology and Oncology

## 2017-07-14 ENCOUNTER — Inpatient Hospital Stay: Payer: Medicare Other

## 2017-07-14 ENCOUNTER — Telehealth: Payer: Self-pay | Admitting: Hematology and Oncology

## 2017-07-14 ENCOUNTER — Encounter: Payer: Self-pay | Admitting: Hematology and Oncology

## 2017-07-14 VITALS — BP 145/65 | HR 97 | Temp 98.4°F | Resp 18 | Ht 66.0 in | Wt 141.1 lb

## 2017-07-14 DIAGNOSIS — E785 Hyperlipidemia, unspecified: Secondary | ICD-10-CM | POA: Insufficient documentation

## 2017-07-14 DIAGNOSIS — Z87891 Personal history of nicotine dependence: Secondary | ICD-10-CM | POA: Diagnosis not present

## 2017-07-14 DIAGNOSIS — D61818 Other pancytopenia: Secondary | ICD-10-CM | POA: Diagnosis not present

## 2017-07-14 DIAGNOSIS — C9110 Chronic lymphocytic leukemia of B-cell type not having achieved remission: Secondary | ICD-10-CM

## 2017-07-14 DIAGNOSIS — C8300 Small cell B-cell lymphoma, unspecified site: Secondary | ICD-10-CM

## 2017-07-14 DIAGNOSIS — I10 Essential (primary) hypertension: Secondary | ICD-10-CM | POA: Insufficient documentation

## 2017-07-14 DIAGNOSIS — Z79899 Other long term (current) drug therapy: Secondary | ICD-10-CM

## 2017-07-14 DIAGNOSIS — I2510 Atherosclerotic heart disease of native coronary artery without angina pectoris: Secondary | ICD-10-CM | POA: Insufficient documentation

## 2017-07-14 DIAGNOSIS — R1033 Periumbilical pain: Secondary | ICD-10-CM | POA: Diagnosis not present

## 2017-07-14 DIAGNOSIS — K529 Noninfective gastroenteritis and colitis, unspecified: Secondary | ICD-10-CM | POA: Insufficient documentation

## 2017-07-14 DIAGNOSIS — Z95828 Presence of other vascular implants and grafts: Secondary | ICD-10-CM

## 2017-07-14 DIAGNOSIS — J45909 Unspecified asthma, uncomplicated: Secondary | ICD-10-CM | POA: Insufficient documentation

## 2017-07-14 LAB — URIC ACID: Uric Acid, Serum: 3.7 mg/dL (ref 2.6–7.4)

## 2017-07-14 LAB — COMPREHENSIVE METABOLIC PANEL
ALT: 10 U/L (ref 0–55)
ANION GAP: 8 (ref 3–11)
AST: 9 U/L (ref 5–34)
Albumin: 2.7 g/dL — ABNORMAL LOW (ref 3.5–5.0)
Alkaline Phosphatase: 47 U/L (ref 40–150)
BUN: 22 mg/dL (ref 7–26)
CALCIUM: 8.8 mg/dL (ref 8.4–10.4)
CO2: 26 mmol/L (ref 22–29)
Chloride: 100 mmol/L (ref 98–109)
Creatinine, Ser: 0.97 mg/dL (ref 0.60–1.10)
GFR calc non Af Amer: 51 mL/min — ABNORMAL LOW (ref >60)
GFR, EST AFRICAN AMERICAN: 59 mL/min — AB (ref >60)
Glucose, Bld: 123 mg/dL (ref 70–140)
POTASSIUM: 3.5 mmol/L (ref 3.5–5.1)
Sodium: 134 mmol/L — ABNORMAL LOW (ref 136–145)
TOTAL PROTEIN: 5.5 g/dL — AB (ref 6.4–8.3)
Total Bilirubin: 0.5 mg/dL (ref 0.2–1.2)

## 2017-07-14 LAB — CBC WITH DIFFERENTIAL/PLATELET
Basophils Absolute: 0 10*3/uL (ref 0.0–0.1)
Basophils Relative: 1 %
EOS ABS: 0 10*3/uL (ref 0.0–0.5)
EOS PCT: 2 %
HCT: 32.8 % — ABNORMAL LOW (ref 34.8–46.6)
Hemoglobin: 10.7 g/dL — ABNORMAL LOW (ref 11.6–15.9)
LYMPHS ABS: 1.8 10*3/uL (ref 0.9–3.3)
Lymphocytes Relative: 74 %
MCH: 28.1 pg (ref 25.1–34.0)
MCHC: 32.7 g/dL (ref 31.5–36.0)
MCV: 86.2 fL (ref 79.5–101.0)
MONO ABS: 0.1 10*3/uL (ref 0.1–0.9)
MONOS PCT: 6 %
Neutro Abs: 0.4 10*3/uL — CL (ref 1.5–6.5)
Neutrophils Relative %: 17 %
Platelets: 196 10*3/uL (ref 145–400)
RBC: 3.8 MIL/uL (ref 3.70–5.45)
RDW: 16.3 % — AB (ref 11.2–14.5)
WBC: 2.4 10*3/uL — ABNORMAL LOW (ref 3.9–10.3)

## 2017-07-14 MED ORDER — SODIUM CHLORIDE 0.9% FLUSH
10.0000 mL | INTRAVENOUS | Status: DC | PRN
  Administered 2017-07-14: 10 mL via INTRAVENOUS
  Filled 2017-07-14: qty 10

## 2017-07-14 MED ORDER — SODIUM CHLORIDE 0.9% FLUSH
10.0000 mL | Freq: Once | INTRAVENOUS | Status: AC
  Administered 2017-07-14: 10 mL
  Filled 2017-07-14: qty 10

## 2017-07-14 MED ORDER — HEPARIN SOD (PORK) LOCK FLUSH 100 UNIT/ML IV SOLN
500.0000 [IU] | Freq: Once | INTRAVENOUS | Status: AC
  Administered 2017-07-14: 500 [IU] via INTRAVENOUS
  Filled 2017-07-14: qty 5

## 2017-07-14 NOTE — Telephone Encounter (Signed)
Gave avs and calendar ° °

## 2017-07-14 NOTE — Assessment & Plan Note (Addendum)
She continues to have intermittent abdominal pain which I suspect is related to disease process She is developing pancytopenia from treatment I recommend holding treatment today and reschedule in the month I plan to repeat imaging study after 2 cycles of chemotherapy In the meantime, we will continue conservative management I recommend prednisone taper by reducing prednisone to 20 mg alternate with 10 mg by mouth every other day with plan for future taper given positive response to treatment

## 2017-07-14 NOTE — Progress Notes (Signed)
[facility] OFFICE PROGRESS NOTE  Patient Care Team: Koirala, Dibas, MD as PCP - General (Family Medicine)  ASSESSMENT & PLAN:  CLL (chronic lymphocytic leukemia) ([place]) She continues to have intermittent abdominal pain which I suspect is related to disease process She is developing pancytopenia from treatment I recommend holding treatment today and reschedule in the month I plan to repeat imaging study after 2 cycles of chemotherapy In the meantime, we will continue conservative management I recommend prednisone taper by reducing prednisone to 20 mg alternate with 10 mg by mouth every other day with plan for future taper given positive response to treatment  Pancytopenia, acquired ([place]) She is developing severe pancytopenia from treatment She is not symptomatic from pancytopenia I plan to reduce the dose of chemotherapy in the future I would cancel treatment and reschedule in 4 weeks  Abdominal pain She has recurrent, intermittent abdominal pain likely due to disease process She is having positive response to treatment In the meantime, we discussed pain management The patient does not want any prescription pain medicine I recommend she continue taking over-the-counter analgesics that so far has worked well for her    No orders of the defined types were placed in this encounter.   INTERVAL HISTORY: Please see below for problem oriented charting. She complained of intermittent abdominal pain that comes and goes The pain is centered around the periumbilical region It is alleviated with over-the-counter analgesics It is interfering with eating She has not lost some weight She denies recent changes in bowel habits No new lymphadenopathy.  She denies recent infection  SUMMARY OF ONCOLOGIC HISTORY:   CLL (chronic lymphocytic leukemia) ([place])   05/02/2013 Initial Diagnosis    She was found to have abnormal CBC from at least since 2015. According to the records from  her primary care doctor that were faxed to the [facility], she had leukocytosis at least since January 2015. On 05/02/2013, total white blood cell count was 28.3, normal hemoglobin 13.4 and platelet count 239,000. She had predominantly Lymphocytosis. On 06/20/2013, a CBC show white count of 26.7, hemoglobin 12.9 and platelet count of 299,000 On admission, on 05/08/2016, white blood cell count was 38.4, hemoglobin 12.1 and platelet count of 210. Differential show predominantly lymphocytosis.      05/08/2016 Imaging    CT scan of chest 1. Negative for acute pulmonary embolism 2. Numerous pathologic nodes in the mediastinum, hila, axillae, supraclavicular regions and upper abdomen. The adenopathy is suspicious for malignancy such as lymphoma or CLL. If tissue diagnosis is desired, many of the supraclavicular and axillary nodes should be palpable and accessible to excisional biopsy. 3. Low-attenuation liver lesions, incompletely imaged and not characterized but more likely benign. 4. Small hiatal hernia.      09/22/2016 Pathology Results    Peripheral Blood Flow Cytometry - CHRONIC LYMPHOCYTIC LEUKEMIA. - SEE COMMENT. Diagnosis Comment: There is a population of monoclonal B-cells with expression of CD5 and CD23. The phenotype is consistent with chronic lymphocytic leukemia      09/22/2016 Pathology Results    FISH confirmed trisomy 12      10/21/2016 PET scan    1. Extensive adenopathy in the neck, chest, abdomen, and pelvis as detailed above, primarily Deauville 3 and Deauville 4. Mild splenomegaly. 2. High activity in some soft tissue prominence along the anal region, possibly physiologic or due to hemorrhoidal activity or urinary incontinence, but correlation with anorectal exam recommended. 3. Focal abnormal accentuated high activity in the sigmoid colon along  a region of abnormal stranding and diverticulosis, suspicious for active diverticulitis. Correlation with the patient's colon  cancer screening history is recommended. If screening is not up-to-date, appropriate screening should be considered. 4. Aortic Atherosclerosis (ICD10-I70.0). Coronary atherosclerosis. 5. An exophytic high density lesion from the right kidney upper pole does not appear to be hypermetabolic and accordingly is most likely to be a benign complex cyst, but this lesion is not fully characterized on today' s exam. If the patient has hematuria or if otherwise clinically warranted, renal protocol MRI or CT examination could be utilized. 6. Some of the smaller hepatic lesions are nonspecific, but the larger hepatic lesions are photopenic and likely cysts.      10/23/2016 Procedure    Placement of single lumen port a cath via right internal jugular vein. The catheter tip lies at the cavo-atrial junction. A power injectable port a cath was placed and is ready for immediate use      10/29/2016 - 10/30/2016 Hospital Admission    She was admitted after allergic reaction to Rituximab      10/29/2016 Adverse Reaction    She received Rituximab complicated by allergic reaction. Treatment was abandoned      11/20/2016 - 04/28/2017 Chemotherapy    The patient is started on chlorambucil and low dose prednisone      04/29/2017 - 05/01/2017 Hospital Admission    She was admitted to the hospital for management of abdominal pain and weakness.      04/30/2017 Imaging    1. Mixed appearance with regard to the extensive nodal disease in the chest, abdomen, and pelvis. Areas of improved adenopathy include the supraclavicular, axillary, and periaortic adenopathy. Areas of worsening include the subcarinal and right inguinal adenopathy. Some of the nodes are very similar to their size on prior PET-CT from 10/21/2016. 2. There is advanced sigmoid diverticulosis and just cephalad to this, there is a region of nodularity and mesenteric infiltration which has progressed compared to 10/21/2016. This may well represent an  infiltrative component of malignancy rather than necessarily being due to chronic diverticulitis. No extraluminal gas is identified. No abscess noted. 3. Upper normal splenic size. There are several hypodense nonspecific splenic lesions. These were not readily appreciable on prior noncontrast images, but no associated hypermetabolic activity was previously seen. 4. Hyperdense 1.9 cm right kidney upper pole lesion is not entirely specific. This is probably a complex cyst. Attention on follow up studies suggested. 5. Transverse sclerosis in the sternal body compatible with late phase healing fracture. This was not present on the prior PET-CT from 10/21/2016. Reviewing the patient's records, she apparently received CPR after an anaphylactic reaction to chemotherapy on or around 10/29/2016, and it is possible (although speculative) that this represents residua from sternal trauma during CPR. Correlate with any extended soreness in the chest after that episode. 6. Other imaging findings of potential clinical significance: Aortic Atherosclerosis (ICD10-I70.0). Coronary atherosclerosis. Airway thickening is present, suggesting bronchitis or reactive airways disease. Scattered hepatic cysts. Multilevel lumbar impingement due to spondylosis and degenerative disc disease.        REVIEW OF SYSTEMS:   Constitutional: Denies fevers, chills or abnormal weight loss Eyes: Denies blurriness of vision Ears, nose, mouth, throat, and face: Denies mucositis or sore throat Respiratory: Denies cough, dyspnea or wheezes Cardiovascular: Denies palpitation, chest discomfort or lower extremity swelling Gastrointestinal:  Denies nausea, heartburn or change in bowel habits Skin: Denies abnormal skin rashes Lymphatics: Denies new lymphadenopathy or easy bruising Neurological:Denies numbness, tingling or new weaknesses  Behavioral/Psych: Mood is stable, no new changes  All other systems were reviewed with the patient and are  negative.  I have reviewed the past medical history, past surgical history, social history and family history with the patient and they are unchanged from previous note.  ALLERGIES:  is allergic to rituximab.  MEDICATIONS:  Current Outpatient Medications  Medication Sig Dispense Refill  . acetaminophen (TYLENOL) 500 MG tablet Take 650 mg by mouth daily as needed for mild pain or headache.     Marland Kitchen acyclovir (ZOVIRAX) 400 MG tablet Take 1 tablet (400 mg total) by mouth daily. 30 tablet 3  . aspirin EC 81 MG EC tablet Take 1 tablet (81 mg total) by mouth daily. 30 tablet 2  . COMBIVENT RESPIMAT 20-100 MCG/ACT AERS respimat Take 2 puffs by mouth every 6 (six) hours as needed for wheezing or shortness of breath.     . isosorbide mononitrate (IMDUR) 30 MG 24 hr tablet Take 30 mg by mouth daily.     Marland Kitchen lidocaine-prilocaine (EMLA) cream Apply 1 application topically as needed. (Patient taking differently: Apply 1 application topically as needed (For port-a-cath.). ) 30 g 6  . lovastatin (MEVACOR) 40 MG tablet Take 40 mg by mouth at bedtime.     . metoprolol tartrate (LOPRESSOR) 25 MG tablet Take 1 tablet (25 mg total) by mouth 2 (two) times daily. 60 tablet 2  . Nutritional Supplements (ENSURE PO) Take 1 Can by mouth daily.    Marland Kitchen omeprazole (PRILOSEC) 20 MG capsule Take 1 capsule (20 mg total) by mouth daily. 30 capsule 11  . ondansetron (ZOFRAN) 8 MG tablet Take 1 tablet (8 mg total) by mouth 2 (two) times daily as needed for refractory nausea / vomiting. Start on day 3 after chemotherapy. 30 tablet 1  . potassium chloride (K-DUR) 10 MEQ tablet Take 10 mEq by mouth daily.    . predniSONE (DELTASONE) 20 MG tablet TAKE 1 TABLET(20 MG) BY MOUTH DAILY WITH BREAKFAST 30 tablet 0  . prochlorperazine (COMPAZINE) 10 MG tablet Take 1 tablet (10 mg total) by mouth every 6 (six) hours as needed (Nausea or vomiting). 30 tablet 1  . QUEtiapine (SEROQUEL) 25 MG tablet Take 12.5 mg by mouth at bedtime.     .  sertraline (ZOLOFT) 50 MG tablet Take 50 mg by mouth daily.  6  . triamterene-hydrochlorothiazide (MAXZIDE-25) 37.5-25 MG tablet Take 0.5 tablets by mouth daily. 30 tablet 2   Current Facility-Administered Medications  Medication Dose Route Frequency Provider Last Rate Last Dose  . sodium chloride flush (NS) 0.9 % injection 10 mL  10 mL Intravenous PRN Alvy Bimler, Imri Lor, MD   10 mL at 07/14/17 1205    PHYSICAL EXAMINATION: ECOG PERFORMANCE STATUS: 2 - Symptomatic, <50% confined to bed  Vitals:   07/14/17 1141  BP: (!) 145/65  Pulse: 97  Resp: 18  Temp: 98.4 F (36.9 C)  SpO2: 97%   Filed Weights   07/14/17 1141  Weight: 141 lb 1.6 oz (64 kg)    GENERAL:alert, no distress and comfortable SKIN: skin color, texture, turgor are normal, no rashes or significant lesions EYES: normal, Conjunctiva are pink and non-injected, sclera clear OROPHARYNX:no exudate, no erythema and lips, buccal mucosa, and tongue normal  NECK: supple, thyroid normal size, non-tender, without nodularity LYMPH: Palpable fullness in the left axillary region LUNGS: clear to auscultation and percussion with normal breathing effort HEART: regular rate & rhythm and no murmurs and no lower extremity edema ABDOMEN:abdomen soft, mild tenderness on  palpation without rebound or guarding Musculoskeletal:no cyanosis of digits and no clubbing  NEURO: alert & oriented x 3 with fluent speech, no focal motor/sensory deficits  LABORATORY DATA:  I have reviewed the data as listed    Component Value Date/Time   NA 134 (L) 07/14/2017 1120   NA 138 02/17/2017 0810   K 3.5 07/14/2017 1120   K 3.7 02/17/2017 0810   CL 100 07/14/2017 1120   CO2 26 07/14/2017 1120   CO2 27 02/17/2017 0810   GLUCOSE 123 07/14/2017 1120   GLUCOSE 92 02/17/2017 0810   BUN 22 07/14/2017 1120   BUN 16.4 02/17/2017 0810   CREATININE 0.97 07/14/2017 1120   CREATININE 1.1 02/17/2017 0810   CALCIUM 8.8 07/14/2017 1120   CALCIUM 9.3 02/17/2017 0810    PROT 5.5 (L) 07/14/2017 1120   PROT 6.4 02/17/2017 0810   ALBUMIN 2.7 (L) 07/14/2017 1120   ALBUMIN 3.9 02/17/2017 0810   AST 9 07/14/2017 1120   AST 13 02/17/2017 0810   ALT 10 07/14/2017 1120   ALT 13 02/17/2017 0810   ALKPHOS 47 07/14/2017 1120   ALKPHOS 74 02/17/2017 0810   BILITOT 0.5 07/14/2017 1120   BILITOT 0.60 02/17/2017 0810   GFRNONAA 51 (L) 07/14/2017 1120   GFRAA 59 (L) 07/14/2017 1120    No results found for: SPEP, UPEP  Lab Results  Component Value Date   WBC 2.4 (L) 07/14/2017   NEUTROABS 0.4 (LL) 07/14/2017   HGB 10.7 (L) 07/14/2017   HCT 32.8 (L) 07/14/2017   MCV 86.2 07/14/2017   PLT 196 07/14/2017      Chemistry      Component Value Date/Time   NA 134 (L) 07/14/2017 1120   NA 138 02/17/2017 0810   K 3.5 07/14/2017 1120   K 3.7 02/17/2017 0810   CL 100 07/14/2017 1120   CO2 26 07/14/2017 1120   CO2 27 02/17/2017 0810   BUN 22 07/14/2017 1120   BUN 16.4 02/17/2017 0810   CREATININE 0.97 07/14/2017 1120   CREATININE 1.1 02/17/2017 0810      Component Value Date/Time   CALCIUM 8.8 07/14/2017 1120   CALCIUM 9.3 02/17/2017 0810   ALKPHOS 47 07/14/2017 1120   ALKPHOS 74 02/17/2017 0810   AST 9 07/14/2017 1120   AST 13 02/17/2017 0810   ALT 10 07/14/2017 1120   ALT 13 02/17/2017 0810   BILITOT 0.5 07/14/2017 1120   BILITOT 0.60 02/17/2017 0810      All questions were answered. The patient knows to call the clinic with any problems, questions or concerns. No barriers to learning was detected.  I spent 20 minutes counseling the patient face to face. The total time spent in the appointment was 25 minutes and more than 50% was on counseling and review of test results  Heath Lark, MD 07/14/2017 12:21 PM

## 2017-07-14 NOTE — Assessment & Plan Note (Signed)
She is developing severe pancytopenia from treatment She is not symptomatic from pancytopenia I plan to reduce the dose of chemotherapy in the future I would cancel treatment and reschedule in 4 weeks

## 2017-07-14 NOTE — Assessment & Plan Note (Signed)
She has recurrent, intermittent abdominal pain likely due to disease process She is having positive response to treatment In the meantime, we discussed pain management The patient does not want any prescription pain medicine I recommend she continue taking over-the-counter analgesics that so far has worked well for her

## 2017-07-18 ENCOUNTER — Encounter (HOSPITAL_COMMUNITY): Payer: Self-pay | Admitting: Emergency Medicine

## 2017-07-18 ENCOUNTER — Inpatient Hospital Stay (HOSPITAL_COMMUNITY)
Admission: EM | Admit: 2017-07-18 | Discharge: 2017-07-23 | DRG: 385 | Disposition: A | Discharge status: Skilled Nursing Facility | Payer: Medicare Other | Attending: Internal Medicine | Admitting: Internal Medicine

## 2017-07-18 ENCOUNTER — Emergency Department (HOSPITAL_COMMUNITY): Payer: Medicare Other

## 2017-07-18 DIAGNOSIS — G893 Neoplasm related pain (acute) (chronic): Secondary | ICD-10-CM | POA: Diagnosis not present

## 2017-07-18 DIAGNOSIS — E785 Hyperlipidemia, unspecified: Secondary | ICD-10-CM | POA: Diagnosis present

## 2017-07-18 DIAGNOSIS — Z6822 Body mass index (BMI) 22.0-22.9, adult: Secondary | ICD-10-CM

## 2017-07-18 DIAGNOSIS — N179 Acute kidney failure, unspecified: Secondary | ICD-10-CM | POA: Diagnosis present

## 2017-07-18 DIAGNOSIS — D61810 Antineoplastic chemotherapy induced pancytopenia: Secondary | ICD-10-CM | POA: Diagnosis present

## 2017-07-18 DIAGNOSIS — R531 Weakness: Secondary | ICD-10-CM

## 2017-07-18 DIAGNOSIS — D61818 Other pancytopenia: Secondary | ICD-10-CM | POA: Diagnosis present

## 2017-07-18 DIAGNOSIS — C9110 Chronic lymphocytic leukemia of B-cell type not having achieved remission: Secondary | ICD-10-CM | POA: Diagnosis present

## 2017-07-18 DIAGNOSIS — H9319 Tinnitus, unspecified ear: Secondary | ICD-10-CM | POA: Diagnosis present

## 2017-07-18 DIAGNOSIS — N183 Chronic kidney disease, stage 3 unspecified: Secondary | ICD-10-CM | POA: Diagnosis present

## 2017-07-18 DIAGNOSIS — D899 Disorder involving the immune mechanism, unspecified: Secondary | ICD-10-CM | POA: Diagnosis present

## 2017-07-18 DIAGNOSIS — R059 Cough, unspecified: Secondary | ICD-10-CM

## 2017-07-18 DIAGNOSIS — H9192 Unspecified hearing loss, left ear: Secondary | ICD-10-CM | POA: Diagnosis present

## 2017-07-18 DIAGNOSIS — R627 Adult failure to thrive: Secondary | ICD-10-CM | POA: Diagnosis present

## 2017-07-18 DIAGNOSIS — E440 Moderate protein-calorie malnutrition: Secondary | ICD-10-CM | POA: Diagnosis present

## 2017-07-18 DIAGNOSIS — Z955 Presence of coronary angioplasty implant and graft: Secondary | ICD-10-CM

## 2017-07-18 DIAGNOSIS — K5792 Diverticulitis of intestine, part unspecified, without perforation or abscess without bleeding: Secondary | ICD-10-CM | POA: Diagnosis present

## 2017-07-18 DIAGNOSIS — Z87891 Personal history of nicotine dependence: Secondary | ICD-10-CM | POA: Diagnosis not present

## 2017-07-18 DIAGNOSIS — K5150 Left sided colitis without complications: Principal | ICD-10-CM | POA: Diagnosis present

## 2017-07-18 DIAGNOSIS — R05 Cough: Secondary | ICD-10-CM

## 2017-07-18 DIAGNOSIS — Z888 Allergy status to other drugs, medicaments and biological substances status: Secondary | ICD-10-CM

## 2017-07-18 DIAGNOSIS — K529 Noninfective gastroenteritis and colitis, unspecified: Secondary | ICD-10-CM | POA: Diagnosis not present

## 2017-07-18 DIAGNOSIS — I10 Essential (primary) hypertension: Secondary | ICD-10-CM | POA: Diagnosis present

## 2017-07-18 DIAGNOSIS — I129 Hypertensive chronic kidney disease with stage 1 through stage 4 chronic kidney disease, or unspecified chronic kidney disease: Secondary | ICD-10-CM | POA: Diagnosis present

## 2017-07-18 DIAGNOSIS — J441 Chronic obstructive pulmonary disease with (acute) exacerbation: Secondary | ICD-10-CM | POA: Diagnosis not present

## 2017-07-18 DIAGNOSIS — Z7982 Long term (current) use of aspirin: Secondary | ICD-10-CM

## 2017-07-18 DIAGNOSIS — Z79899 Other long term (current) drug therapy: Secondary | ICD-10-CM

## 2017-07-18 DIAGNOSIS — J449 Chronic obstructive pulmonary disease, unspecified: Secondary | ICD-10-CM | POA: Diagnosis present

## 2017-07-18 DIAGNOSIS — F329 Major depressive disorder, single episode, unspecified: Secondary | ICD-10-CM | POA: Diagnosis present

## 2017-07-18 DIAGNOSIS — I2510 Atherosclerotic heart disease of native coronary artery without angina pectoris: Secondary | ICD-10-CM | POA: Diagnosis present

## 2017-07-18 DIAGNOSIS — D709 Neutropenia, unspecified: Secondary | ICD-10-CM | POA: Diagnosis present

## 2017-07-18 DIAGNOSIS — G4700 Insomnia, unspecified: Secondary | ICD-10-CM | POA: Diagnosis present

## 2017-07-18 DIAGNOSIS — E860 Dehydration: Secondary | ICD-10-CM | POA: Diagnosis present

## 2017-07-18 LAB — COMPREHENSIVE METABOLIC PANEL
ALBUMIN: 3.2 g/dL — AB (ref 3.5–5.0)
ALK PHOS: 51 U/L (ref 38–126)
ALT: 12 U/L — ABNORMAL LOW (ref 14–54)
ANION GAP: 12 (ref 5–15)
AST: 13 U/L — ABNORMAL LOW (ref 15–41)
BILIRUBIN TOTAL: 0.9 mg/dL (ref 0.3–1.2)
BUN: 18 mg/dL (ref 6–20)
CALCIUM: 8.7 mg/dL — AB (ref 8.9–10.3)
CO2: 26 mmol/L (ref 22–32)
Chloride: 98 mmol/L — ABNORMAL LOW (ref 101–111)
Creatinine, Ser: 0.92 mg/dL (ref 0.44–1.00)
GFR calc Af Amer: >60 mL/min (ref >60)
GFR calc non Af Amer: 54 mL/min — ABNORMAL LOW (ref >60)
GLUCOSE: 103 mg/dL — AB (ref 65–99)
Potassium: 3.5 mmol/L (ref 3.5–5.1)
Sodium: 136 mmol/L (ref 135–145)
TOTAL PROTEIN: 6.2 g/dL — AB (ref 6.5–8.1)

## 2017-07-18 LAB — DIFFERENTIAL
BAND NEUTROPHILS: 0 %
BASOS PCT: 0 %
Basophils Absolute: 0 10*3/uL (ref 0.0–0.1)
Blasts: 0 %
Eosinophils Absolute: 0.1 10*3/uL (ref 0.0–0.7)
Eosinophils Relative: 1 %
LYMPHS ABS: 2 10*3/uL (ref 0.7–4.0)
LYMPHS PCT: 42 %
MONO ABS: 0.8 10*3/uL (ref 0.1–1.0)
Metamyelocytes Relative: 0 %
Monocytes Relative: 17 %
Myelocytes: 0 %
NRBC: 0 /100{WBCs}
Neutro Abs: 2 10*3/uL (ref 1.7–7.7)
Neutrophils Relative %: 39 %
Other: 0 %
PROMYELOCYTES RELATIVE: 1 %

## 2017-07-18 LAB — URINALYSIS, ROUTINE W REFLEX MICROSCOPIC
BILIRUBIN URINE: NEGATIVE
Glucose, UA: NEGATIVE mg/dL
Hgb urine dipstick: NEGATIVE
KETONES UR: NEGATIVE mg/dL
Leukocytes, UA: NEGATIVE
NITRITE: NEGATIVE
PROTEIN: NEGATIVE mg/dL
pH: 7 (ref 5.0–8.0)

## 2017-07-18 LAB — CBC
HCT: 36.3 % (ref 36.0–46.0)
HEMOGLOBIN: 11.8 g/dL — AB (ref 12.0–15.0)
MCH: 28.4 pg (ref 26.0–34.0)
MCHC: 32.5 g/dL (ref 30.0–36.0)
MCV: 87.3 fL (ref 78.0–100.0)
Platelets: 244 10*3/uL (ref 150–400)
RBC: 4.16 MIL/uL (ref 3.87–5.11)
RDW: 16.1 % — AB (ref 11.5–15.5)
WBC: 4.9 10*3/uL (ref 4.0–10.5)

## 2017-07-18 LAB — LIPASE, BLOOD: Lipase: 29 U/L (ref 11–51)

## 2017-07-18 LAB — I-STAT CG4 LACTIC ACID, ED: Lactic Acid, Venous: 0.53 mmol/L (ref 0.5–1.9)

## 2017-07-18 MED ORDER — SODIUM CHLORIDE 0.9 % IV BOLUS
1000.0000 mL | Freq: Once | INTRAVENOUS | Status: AC
  Administered 2017-07-18: 1000 mL via INTRAVENOUS

## 2017-07-18 MED ORDER — PANTOPRAZOLE SODIUM 40 MG PO TBEC
40.0000 mg | DELAYED_RELEASE_TABLET | Freq: Every day | ORAL | Status: DC
  Administered 2017-07-18 – 2017-07-23 (×6): 40 mg via ORAL
  Filled 2017-07-18 (×6): qty 1

## 2017-07-18 MED ORDER — ISOSORBIDE MONONITRATE ER 30 MG PO TB24
30.0000 mg | ORAL_TABLET | Freq: Every day | ORAL | Status: DC
  Administered 2017-07-18 – 2017-07-21 (×4): 30 mg via ORAL
  Filled 2017-07-18 (×4): qty 1

## 2017-07-18 MED ORDER — IOPAMIDOL (ISOVUE-300) INJECTION 61%
INTRAVENOUS | Status: AC
  Filled 2017-07-18: qty 100

## 2017-07-18 MED ORDER — PIPERACILLIN-TAZOBACTAM 4.5 G IVPB: 4.5000 g | Freq: Once | INTRAVENOUS | Status: DC

## 2017-07-18 MED ORDER — METOPROLOL TARTRATE 25 MG PO TABS
25.0000 mg | ORAL_TABLET | Freq: Two times a day (BID) | ORAL | Status: DC
  Administered 2017-07-18 – 2017-07-23 (×11): 25 mg via ORAL
  Filled 2017-07-18 (×11): qty 1

## 2017-07-18 MED ORDER — PREDNISONE 10 MG PO TABS
10.0000 mg | ORAL_TABLET | ORAL | Status: DC
  Administered 2017-07-19 – 2017-07-23 (×3): 10 mg via ORAL
  Filled 2017-07-18: qty 2
  Filled 2017-07-18 (×2): qty 1

## 2017-07-18 MED ORDER — PIPERACILLIN-TAZOBACTAM 3.375 G IVPB
3.3750 g | Freq: Three times a day (TID) | INTRAVENOUS | Status: DC
  Administered 2017-07-18 – 2017-07-23 (×14): 3.375 g via INTRAVENOUS
  Filled 2017-07-18 (×15): qty 50

## 2017-07-18 MED ORDER — ACETAMINOPHEN 500 MG PO TABS
500.0000 mg | ORAL_TABLET | Freq: Four times a day (QID) | ORAL | Status: DC
  Administered 2017-07-18 – 2017-07-23 (×19): 500 mg via ORAL
  Filled 2017-07-18 (×19): qty 1

## 2017-07-18 MED ORDER — SERTRALINE HCL 50 MG PO TABS
50.0000 mg | ORAL_TABLET | Freq: Every day | ORAL | Status: DC
  Administered 2017-07-18 – 2017-07-23 (×6): 50 mg via ORAL
  Filled 2017-07-18 (×6): qty 1

## 2017-07-18 MED ORDER — TRIAMTERENE-HCTZ 37.5-25 MG PO TABS
0.5000 | ORAL_TABLET | Freq: Every day | ORAL | Status: DC
  Administered 2017-07-18 – 2017-07-20 (×3): 0.5 via ORAL
  Filled 2017-07-18: qty 1
  Filled 2017-07-18: qty 0.5
  Filled 2017-07-18: qty 1

## 2017-07-18 MED ORDER — ENOXAPARIN SODIUM 40 MG/0.4ML ~~LOC~~ SOLN
40.0000 mg | SUBCUTANEOUS | Status: DC
  Administered 2017-07-18 – 2017-07-23 (×6): 40 mg via SUBCUTANEOUS
  Filled 2017-07-18 (×6): qty 0.4

## 2017-07-18 MED ORDER — ACETAMINOPHEN 325 MG PO TABS: 650.0000 mg | ORAL_TABLET | Freq: Four times a day (QID) | ORAL | Status: DC | PRN

## 2017-07-18 MED ORDER — IOPAMIDOL (ISOVUE-300) INJECTION 61%
100.0000 mL | Freq: Once | INTRAVENOUS | Status: AC | PRN
  Administered 2017-07-18: 80 mL via INTRAVENOUS

## 2017-07-18 MED ORDER — QUETIAPINE FUMARATE 25 MG PO TABS
12.5000 mg | ORAL_TABLET | Freq: Every day | ORAL | Status: DC
  Administered 2017-07-18 – 2017-07-22 (×5): 12.5 mg via ORAL
  Filled 2017-07-18 (×5): qty 1

## 2017-07-18 MED ORDER — MORPHINE SULFATE (PF) 2 MG/ML IV SOLN
1.0000 mg | INTRAVENOUS | Status: DC | PRN
  Administered 2017-07-18: 1 mg via INTRAVENOUS
  Filled 2017-07-18: qty 1

## 2017-07-18 MED ORDER — ACYCLOVIR 400 MG PO TABS
400.0000 mg | ORAL_TABLET | Freq: Every day | ORAL | Status: DC
  Administered 2017-07-18 – 2017-07-23 (×6): 400 mg via ORAL
  Filled 2017-07-18 (×6): qty 1

## 2017-07-18 MED ORDER — POTASSIUM CHLORIDE CRYS ER 10 MEQ PO TBCR
10.0000 meq | EXTENDED_RELEASE_TABLET | Freq: Every day | ORAL | Status: DC
  Administered 2017-07-18 – 2017-07-23 (×6): 10 meq via ORAL
  Filled 2017-07-18 (×12): qty 1

## 2017-07-18 MED ORDER — OXYCODONE HCL 5 MG PO TABS
2.5000 mg | ORAL_TABLET | ORAL | Status: DC | PRN
  Administered 2017-07-19: 2.5 mg via ORAL
  Filled 2017-07-18: qty 1

## 2017-07-18 MED ORDER — ASPIRIN EC 81 MG PO TBEC
81.0000 mg | DELAYED_RELEASE_TABLET | Freq: Every day | ORAL | Status: DC
  Administered 2017-07-18 – 2017-07-23 (×6): 81 mg via ORAL
  Filled 2017-07-18 (×6): qty 1

## 2017-07-18 MED ORDER — KETOROLAC TROMETHAMINE 15 MG/ML IJ SOLN
7.5000 mg | Freq: Three times a day (TID) | INTRAMUSCULAR | Status: AC
  Administered 2017-07-18 – 2017-07-20 (×6): 7.5 mg via INTRAVENOUS
  Filled 2017-07-18 (×6): qty 1

## 2017-07-18 MED ORDER — PIPERACILLIN-TAZOBACTAM 3.375 G IVPB 30 MIN
3.3750 g | Freq: Once | INTRAVENOUS | Status: AC
  Administered 2017-07-18: 3.375 g via INTRAVENOUS
  Filled 2017-07-18: qty 50

## 2017-07-18 MED ORDER — PRAVASTATIN SODIUM 20 MG PO TABS
40.0000 mg | ORAL_TABLET | Freq: Every day | ORAL | Status: DC
  Administered 2017-07-18 – 2017-07-22 (×4): 40 mg via ORAL
  Filled 2017-07-18 (×4): qty 2

## 2017-07-18 MED ORDER — FENTANYL CITRATE (PF) 100 MCG/2ML IJ SOLN
12.5000 ug | Freq: Once | INTRAMUSCULAR | Status: AC
  Administered 2017-07-18: 12.5 ug via INTRAVENOUS
  Filled 2017-07-18: qty 2

## 2017-07-18 MED ORDER — HYDROMORPHONE HCL 1 MG/ML IJ SOLN
0.5000 mg | INTRAMUSCULAR | Status: DC | PRN
  Administered 2017-07-18: 15:00:00 0.5 mg via INTRAVENOUS
  Filled 2017-07-18: qty 0.5

## 2017-07-18 MED ORDER — PREDNISONE 20 MG PO TABS
20.0000 mg | ORAL_TABLET | ORAL | Status: DC
  Administered 2017-07-18 – 2017-07-22 (×3): 20 mg via ORAL
  Filled 2017-07-18 (×3): qty 1

## 2017-07-18 NOTE — ED Provider Notes (Signed)
[facility] DEPT Provider Note   CSN: [id number] Arrival date & time: 07/18/17  7989     History   Chief Complaint Chief Complaint  Patient presents with  . Abdominal Pain    HPI Carla Conway is a 84 y.o. female.  HPI 84 yo female with history of CLL  presenting with crampy abdominal pain.  Pt went to onvolgist and described abdominal pain. Dr. Alvy Bimler felt the pain was secondary to disease process.  Pt pancytopenic from treatemetn for CLL.   Pt on crhonic steroids recently decreased.  No nausea no vomiting, no change in bowels.  The pain is crampy that is better when lying down.   Past Medical History:  Diagnosis Date  . Asthma   . CAD (coronary artery disease)   . Hyperlipidemia   . Hypertension     Patient Active Problem List   Diagnosis Date Noted  . Pancytopenia, acquired ([place]) 07/14/2017  . Malignant cachexia ([place]) 06/04/2017  . Hypokalemia 04/29/2017  . Left arm pain 04/29/2017  . TIA (transient ischemic attack) 04/29/2017  . Abdominal pain 04/28/2017  . Elevated CK 04/01/2017  . AKI (acute kidney injury) ([place]) 03/31/2017  . Dehydration 03/31/2017  . Generalized weakness 03/31/2017  . External hemorrhoids without complication 21/19/4174  . Port-A-Cath in place 01/06/2017  . Gastritis 01/06/2017  . Poor memory 12/11/2016  . Dysuria 11/13/2016  . Anaphylaxis 10/29/2016  . Anaphylactoid reaction 10/29/2016  . Goals of care, counseling/discussion 10/28/2016  . Chronic kidney disease, stage III (moderate) ([place]) 10/17/2016  . Lymphoma, small lymphocytic ([place]) 10/15/2016  . Neutropenia ([place]) 09/23/2016  . CLL (chronic lymphocytic leukemia) ([place]) 05/11/2016  . Acute on chronic diastolic heart failure ([place]) 05/10/2016  . Influenza A 05/09/2016  . Lymphadenopathy of head and neck region 05/09/2016  . Lymphadenopathy, thoracic 05/09/2016  . COPD (chronic obstructive pulmonary disease) ([place]) 05/09/2016  . Debility 05/09/2016  .  Syncopal episodes 05/09/2016  . Syncope 05/08/2016  . HTN (hypertension) 05/18/2013  . CAD in native artery 05/18/2013    Past Surgical History:  Procedure Laterality Date  . CATARACT EXTRACTION    . CORONARY ANGIOPLASTY  10 -15 years ago   stent in [place] ,[place]  . IR FLUORO GUIDE PORT INSERTION RIGHT  10/23/2016  . IR US GUIDE VASC ACCESS RIGHT  10/23/2016     OB History   None      Home Medications    Prior to Admission medications   Medication Sig Start Date End Date Taking? Authorizing Provider  acetaminophen (TYLENOL) 500 MG tablet Take 650 mg by mouth daily as needed for mild pain or headache.     [provider]  acyclovir (ZOVIRAX) 400 MG tablet Take 1 tablet (400 mg total) by mouth daily. 06/04/17   Heath Lark, MD  aspirin EC 81 MG EC tablet Take 1 tablet (81 mg total) by mouth daily. 05/02/17   Oswald Hillock, MD  COMBIVENT RESPIMAT 20-100 MCG/ACT AERS respimat Take 2 puffs by mouth every 6 (six) hours as needed for wheezing or shortness of breath.  04/16/13   [provider]  isosorbide mononitrate (IMDUR) 30 MG 24 hr tablet Take 30 mg by mouth daily.  05/09/13   [provider]  lidocaine-prilocaine (EMLA) cream Apply 1 application topically as needed. Patient taking differently: Apply 1 application topically as needed (For port-a-cath.).  10/27/16   Heath Lark, MD  lovastatin (MEVACOR) 40 MG tablet Take 40 mg by mouth at bedtime.  05/09/13  [provider]  metoprolol tartrate (LOPRESSOR) 25 MG tablet Take 1 tablet (25 mg total) by mouth 2 (two) times daily. 05/01/17   Oswald Hillock, MD  Nutritional Supplements (ENSURE PO) Take 1 Can by mouth daily.    [provider]  omeprazole (PRILOSEC) 20 MG capsule Take 1 capsule (20 mg total) by mouth daily. 01/06/17   Heath Lark, MD  ondansetron (ZOFRAN) 8 MG tablet Take 1 tablet (8 mg total) by mouth 2 (two) times daily as needed for refractory nausea / vomiting. Start on day 3 after  chemotherapy. 06/04/17   Heath Lark, MD  potassium chloride (K-DUR) 10 MEQ tablet Take 10 mEq by mouth daily. 03/24/16   [provider]  predniSONE (DELTASONE) 20 MG tablet TAKE 1 TABLET(20 MG) BY MOUTH DAILY WITH BREAKFAST 06/18/17   Heath Lark, MD  prochlorperazine (COMPAZINE) 10 MG tablet Take 1 tablet (10 mg total) by mouth every 6 (six) hours as needed (Nausea or vomiting). 06/04/17   Heath Lark, MD  QUEtiapine (SEROQUEL) 25 MG tablet Take 12.5 mg by mouth at bedtime.     [provider]  sertraline (ZOLOFT) 50 MG tablet Take 50 mg by mouth daily. 04/17/17   [provider]  triamterene-hydrochlorothiazide (MAXZIDE-25) 37.5-25 MG tablet Take 0.5 tablets by mouth daily. 05/01/17   Oswald Hillock, MD    Family History Family History  Problem Relation Age of Onset  . Cancer Father        colon    Social History Social History   Tobacco Use  . Smoking status: Former Smoker    Types: Cigarettes    Start date: 05/18/1983  . Smokeless tobacco: Never Used  Substance Use Topics  . Alcohol use: No  . Drug use: No     Allergies   Rituximab   Review of Systems Review of Systems  Constitutional: Negative for fatigue and fever.  HENT: Negative for congestion.   Gastrointestinal: Positive for abdominal pain. Negative for diarrhea, nausea and vomiting.     Physical Exam Updated Vital Signs BP 128/71 (BP Location: Right Arm)   Pulse (!) 117   Temp 97.9 F (36.6 C) (Oral)   Resp 20   SpO2 96%   Physical Exam  Constitutional: She is oriented to person, place, and time. She appears well-developed and well-nourished.  Elderly thin female.  HENT:  Head: Normocephalic and atraumatic.  Eyes: Right eye exhibits no discharge.  Cardiovascular: Normal rate and regular rhythm.  Pulmonary/Chest: Effort normal and breath sounds normal.  Abdominal: Normal appearance. There is tenderness.  Tenderness in bilateral lower quadrants  Neurological: She is oriented  to person, place, and time.  Skin: Skin is warm and dry. She is not diaphoretic.  Psychiatric: She has a normal mood and affect.  Nursing note and vitals reviewed.    ED Treatments / Results  Labs (all labs ordered are listed, but only abnormal results are displayed) Labs Reviewed  CBC - Abnormal; Notable for the following components:      Result Value   Hemoglobin 11.8 (*)    RDW 16.1 (*)    All other components within normal limits  LIPASE, BLOOD  COMPREHENSIVE METABOLIC PANEL  URINALYSIS, ROUTINE W REFLEX MICROSCOPIC    EKG None  Radiology No results found.  Procedures Procedures (including critical care time)  CRITICAL CARE Performed by: Gardiner Sleeper Total critical care time: 60 minutes Critical care time was exclusive of separately billable procedures and treating other patients. Critical care  was necessary to treat or prevent imminent or life-threatening deterioration. Critical care was time spent personally by me on the following activities: development of treatment plan with patient and/or surrogate as well as nursing, discussions with consultants, evaluation of patient's response to treatment, examination of patient, obtaining history from patient or surrogate, ordering and performing treatments and interventions, ordering and review of laboratory studies, ordering and review of radiographic studies, pulse oximetry and re-evaluation of patient's condition.   Medications Ordered in ED Medications - No data to display   Initial Impression / Assessment and Plan / ED Course  I have reviewed the triage vital signs and the nursing notes.  Pertinent labs & imaging results that were available during my care of the patient were reviewed by me and considered in my medical decision making (see chart for details).    84 yo female with history of CLL  presenting with crampy abdominal pain.  Pt went to onvolgist and described abdominal pain. Dr. Alvy Bimler felt the pain  esds secondary to disease process.  Pt pancytopenic from treatemetn for CLL.   Pt on crhonic steroids recently decreased.  No nausea no vomiting, no change in bowels.  The pain is crampy that is better when lying down.    10:39 AM Patient has tenderness of bilateral lower quadrants.  Concern for UTI versus colitis.  Will get CT abdomen pelvis, patient mildly tachycardic with pain.  Will give fluids, pain control.  11:27 AM Discussed with Dr. Jacqualyn Posey from radiology he reports there is diverticulitis however in the setting of neutropenia, this would qualify for Neutropenic enterocolitis, which has an increased chance of acute decompensation.  Will admit patient for IV antibiotics and continue monitoring although here she appears stable currently.   Final Clinical Impressions(s) / ED Diagnoses   Final diagnoses:  None    ED Discharge Orders    None       Macarthur Critchley, MD 07/19/17 [phone]

## 2017-07-18 NOTE — ED Triage Notes (Signed)
Patient here from home brought in by daughter with complaints of lower abdominal pain down into pelvis. Hx of CLL. Saw oncology for same. Increase pain today. Denies nausea, vomiting.

## 2017-07-18 NOTE — ED Notes (Signed)
ED TO INPATIENT HANDOFF REPORT  Name/Age/Gender Carla Conway 84 y.o. female  Code Status Code Status History    Date Active Date Inactive Code Status Order ID Comments User Context   04/29/2017 1812 05/01/2017 1952 Full Code [id number]  Barton Dubois, MD ED   03/31/2017 2208 04/03/2017 2108 Full Code [id number]  Etta Quill, DO ED   10/29/2016 1534 10/30/2016 1634 Full Code [id number]  Elmarie Shiley, MD Inpatient   05/09/2016 0049 05/11/2016 1904 Full Code [id number]  Roney Jaffe, MD Inpatient      Home/SNF/Other Home  Chief Complaint CA pt, - abd pain  Level of Care/Admitting Diagnosis ED Disposition    ED Disposition Condition [facility]: [facility] [[id number]]  Level of Care: Med-Surg [16]  Diagnosis: Enterocolitis [924268]  Admitting Physician: Elodia Florence [phone]  Attending Physician: Cephus Slater, A CALDWELL [phone]  PT Class (Do Not Modify): Observation [104]  PT Acc Code (Do Not Modify): Observation [10022]       Medical History Past Medical History:  Diagnosis Date  . Asthma   . CAD (coronary artery disease)   . Hyperlipidemia   . Hypertension     Allergies Allergies  Allergen Reactions  . Rituximab Other (See Comments)    "She coded"/Upset stomach and blood pressure dropped    IV Location/Drains/Wounds Patient Lines/Drains/Airways Status   Active Line/Drains/Airways    Name:   Placement date:   Placement time:   Site:   Days:   Implanted Port 10/23/16 Right Chest   10/23/16    0954    Chest   268   Peripheral IV 07/18/17 Left Antecubital   07/18/17    1115    Antecubital   less than 1          Labs/Imaging Results for orders placed or performed during the hospital encounter of 07/18/17 (from the past 48 hour(s))  Lipase, blood     Status: None   Collection Time: 07/18/17  9:45 AM  Result Value Ref Range   Lipase 29 11 - 51 U/L    Comment: Performed at [facility], [place] [address], [place], [place] [address]  Comprehensive metabolic panel     Status: Abnormal   Collection Time: 07/18/17  9:45 AM  Result Value Ref Range   Sodium 136 135 - 145 mmol/L   Potassium 3.5 3.5 - 5.1 mmol/L   Chloride 98 (L) 101 - 111 mmol/L   CO2 26 22 - 32 mmol/L   Glucose, Bld 103 (H) 65 - 99 mg/dL   BUN 18 6 - 20 mg/dL   Creatinine, Ser 0.92 0.44 - 1.00 mg/dL   Calcium 8.7 (L) 8.9 - 10.3 mg/dL   Total Protein 6.2 (L) 6.5 - 8.1 g/dL   Albumin 3.2 (L) 3.5 - 5.0 g/dL   AST 13 (L) 15 - 41 U/L   ALT 12 (L) 14 - 54 U/L   Alkaline Phosphatase 51 38 - 126 U/L   Total Bilirubin 0.9 0.3 - 1.2 mg/dL   GFR calc non Af Amer 54 (L) >60 mL/min   GFR calc Af Amer >60 >60 mL/min    Comment: (NOTE) The eGFR has been calculated using the CKD EPI equation. This calculation has not been validated in all clinical situations. eGFR's persistently <60 mL/min signify possible Chronic Kidney Disease.    Anion gap 12 5 - 15    Comment: Performed at [facility],  [place] [address], [place], [place] [address]  CBC     Status: Abnormal   Collection Time: 07/18/17  9:45 AM  Result Value Ref Range   WBC 4.9 4.0 - 10.5 K/uL   RBC 4.16 3.87 - 5.11 MIL/uL   Hemoglobin 11.8 (L) 12.0 - 15.0 g/dL   HCT 36.3 36.0 - 46.0 %   MCV 87.3 78.0 - 100.0 fL   MCH 28.4 26.0 - 34.0 pg   MCHC 32.5 30.0 - 36.0 g/dL   RDW 16.1 (H) 11.5 - 15.5 %   Platelets 244 150 - 400 K/uL    Comment: Performed at [facility], [place] [address], [place], [place] [address]  I-Stat CG4 Lactic Acid, ED     Status: None   Collection Time: 07/18/17 11:44 AM  Result Value Ref Range   Lactic Acid, Venous 0.53 0.5 - 1.9 mmol/L   Ct Abdomen Pelvis W Contrast  Result Date: 07/18/2017 CLINICAL DATA:  84 year old female with a history of CLL and new crampy abdominal pain EXAM: CT ABDOMEN AND PELVIS WITH CONTRAST TECHNIQUE: Multidetector CT imaging of the abdomen and pelvis was performed using the  standard protocol following bolus administration of intravenous contrast. CONTRAST:  47m ISOVUE-300 IOPAMIDOL (ISOVUE-300) INJECTION 61% COMPARISON:  04/30/2017, 10/21/2016, 05/08/2016 FINDINGS: Lower chest: No acute finding of the chest. Calcifications of left main, left anterior descending, circumflex, right coronary arteries. Hepatobiliary: Redemonstration of multiple low-density cystic structures of liver parenchyma, the largest in the left liver measuring 19 mm and 17 mm, most likely benign cysts. Unremarkable gallbladder Pancreas: Unremarkable pancreas Spleen: Low-density lesions at the posterior aspect of the spleen are unchanged compared to prior CT studies, with the largest measuring 9 mm, potentially related to the patient's primary diagnosis of CLL. Adrenals/Urinary Tract: Unremarkable appearance of the adrenal glands. Unremarkable left kidney with no hydronephrosis or nephrolithiasis. Right kidney with no hydronephrosis or nephrolithiasis. Low-density structure at the upper right kidney is unchanged over time, most likely a complex cyst. There are additional low-density lesions which are too small to characterize. Unremarkable course the bilateral ureters. Unremarkable urinary bladder. Stomach/Bowel: Inflammatory changes of the sigmoid colon and descending colon with no evidence of perforation. No abscess. There are extensive diverticular changes throughout this segment of colon. No evidence of obstruction. Cecum unremarkable.  Normal appendix. Small bowel is decompressed with no transition point. No evidence of obstruction. Unremarkable stomach. Small hiatal hernia. Vascular/Lymphatic: Extensive calcifications of the abdominal aorta. The bilateral iliac and proximal femoral arteries are patent. Calcifications of the mesenteric vasculature, with no evidence of obstruction. Calcifications at the origin the bilateral renal arteries, though remain patent. Redemonstration of extensive lymphadenopathy of the  abdomen including mesenteric, periaortic/periaortic nodes, and pelvic nodes. Overall, the size of lymph nodes has decreased when compared to PET CT of 10/21/2016. Reproductive: Unremarkable appearance of the uterus and adnexa. Other: Fat containing umbilical hernia. Musculoskeletal: Multilevel degenerative changes of the spine. No displaced fracture. IMPRESSION: Acute inflammatory changes of the descending colon and sigmoid colon, compatible with acute diverticulitis/neutropenic enterocolitis in this patient with leukopenia. Extensive lymph nodes within the abdomen/retroperitoneum, which are relatively decreased in size overall when compared to prior PET-CT of July 2018, likely reflecting treatment changes. Aortic atherosclerosis and associated coronary artery disease. Aortic Atherosclerosis (ICD10-I70.0). These results were called by telephone at the time of interpretation on 07/18/2017 at 11:27 am to Dr. CZenovia Jarred, who verbally acknowledged these results. Electronically Signed   By: JCorrie MckusickD.O.   On: 07/18/2017  11:27    Pending Labs Unresulted Labs (From admission, onward)   Start     Ordered   07/18/17 1142  Culture, blood (routine x 2)  BLOOD CULTURE X 2,   R     07/18/17 1141   07/18/17 0945  Differential  Once,   STAT     07/18/17 0945   07/18/17 0922  Urinalysis, Routine w reflex microscopic  STAT,   STAT     07/18/17 0921   Signed and Held  CBC  (enoxaparin (LOVENOX)    CrCl < 30 ml/min)  Once,   R    Comments:  Baseline for enoxaparin therapy IF NOT ALREADY DRAWN.  Notify MD if PLT < 100 K.    Signed and Held   Signed and Held  Creatinine, serum  (enoxaparin (LOVENOX)    CrCl < 30 ml/min)  Once,   R    Comments:  Baseline for enoxaparin therapy IF NOT ALREADY DRAWN.    Signed and Held   Signed and Held  Creatinine, serum  (enoxaparin (LOVENOX)    CrCl < 30 ml/min)  Weekly,   R    Comments:  while on enoxaparin therapy.    Signed and Held   Signed and Held   Comprehensive metabolic panel  Tomorrow morning,   R     Signed and Held   Signed and Held  Basic metabolic panel  Tomorrow morning,   R     Signed and Held      Vitals/Pain Today's Vitals   07/18/17 1143 07/18/17 1225 07/18/17 1226 07/18/17 1240  BP:  (!) 136/123  (!) 141/87  Pulse:  83  95  Resp:  (!) 25  18  Temp:      TempSrc:      SpO2:  96%  98%  PainSc: 7   6      Isolation Precautions No active isolations  Medications Medications  iopamidol (ISOVUE-300) 61 % injection (has no administration in time range)  morphine 2 MG/ML injection 1 mg (1 mg Intravenous Given 07/18/17 1226)  oxyCODONE (Oxy IR/ROXICODONE) immediate release tablet 2.5 mg (has no administration in time range)  piperacillin-tazobactam (ZOSYN) IVPB 3.375 g (has no administration in time range)  sodium chloride 0.9 % bolus 1,000 mL (1,000 mLs Intravenous New Bag/Given 07/18/17 1116)  fentaNYL (SUBLIMAZE) injection 12.5 mcg (12.5 mcg Intravenous Given 07/18/17 1116)  iopamidol (ISOVUE-300) 61 % injection 100 mL (80 mLs Intravenous Contrast Given 07/18/17 1051)  piperacillin-tazobactam (ZOSYN) IVPB 3.375 g (0 g Intravenous Stopped 07/18/17 1226)    Mobility walks with person assist

## 2017-07-18 NOTE — H&P (Signed)
History and Physical    Faydra Korman WRU:[id number] DOB: 07-27-1926 DOA: 07/18/2017  PCP: Lujean Amel, MD Patient coming from: home  I have personally briefly reviewed patient's old medical records in [place]  Chief Complaint: abdominal pain  HPI: Darleene Cumpian is a 84 y.o. female with medical history significant of coronary artery disease, hypertension, CLL presented with abdominal pain imaging concerning for diverticulitis versus neutropenic enterocolitis.  She notes her symptoms started about 1-1/2 weeks ago.  She noticed pain in her pelvic region.  Describes it as cramps and constant.  Nothing really makes the pain worse.  Tylenol might help improve the pain a little bit.  She denies ever having any fevers, chills or chills, nausea, vomiting, diarrhea, chest pain or shortness of breath.  She saw Dr. Alvy Bimler on 4/9 who noted that her abdominal pain has been recurrent and intermittent.  At that time it was thought to be due to her overall disease process.  She is receiving chemotherapy with Dr. Alvy Bimler.  Her most recent chemotherapy was held due to pancytopenia.    ED Course: Labs, imaging, antibiotics.  Admit for diverticulits vs neutropenic enterocolitis.  Review of Systems: As per HPI otherwise 10 point review of systems negative.   Past Medical History:  Diagnosis Date  . Asthma   . CAD (coronary artery disease)   . Hyperlipidemia   . Hypertension     Past Surgical History:  Procedure Laterality Date  . CATARACT EXTRACTION    . CORONARY ANGIOPLASTY  10 -15 years ago   stent in [place] ,[place]  . IR FLUORO GUIDE PORT INSERTION RIGHT  10/23/2016  . IR US GUIDE VASC ACCESS RIGHT  10/23/2016     reports that she has quit smoking. Her smoking use included cigarettes. She started smoking about 34 years ago. She has never used smokeless tobacco. She reports that she does not drink alcohol or use drugs.  Allergies  Allergen Reactions  . Rituximab Other (See Comments)   "She coded"/Upset stomach and blood pressure dropped    Family History  Problem Relation Age of Onset  . Cancer Father        colon   Prior to Admission medications   Medication Sig Start Date End Date Taking? Authorizing Provider  acetaminophen (TYLENOL) 500 MG tablet Take 650 mg by mouth daily as needed for mild pain or headache.    Yes [provider]  acyclovir (ZOVIRAX) 400 MG tablet Take 1 tablet (400 mg total) by mouth daily. 06/04/17  Yes Heath Lark, MD  aspirin EC 81 MG EC tablet Take 1 tablet (81 mg total) by mouth daily. 05/02/17  Yes Lama, Marge Duncans, MD  COMBIVENT RESPIMAT 20-100 MCG/ACT AERS respimat Take 2 puffs by mouth every 6 (six) hours as needed for wheezing or shortness of breath.  04/16/13  Yes [provider]  isosorbide mononitrate (IMDUR) 30 MG 24 hr tablet Take 30 mg by mouth daily.  05/09/13  Yes [provider]  lidocaine-prilocaine (EMLA) cream Apply 1 application topically as needed. Patient taking differently: Apply 1 application topically as needed (For port-a-cath.).  10/27/16  Yes Gorsuch, Ni, MD  lovastatin (MEVACOR) 40 MG tablet Take 40 mg by mouth at bedtime.  05/09/13  Yes [provider]  metoprolol tartrate (LOPRESSOR) 25 MG tablet Take 1 tablet (25 mg total) by mouth 2 (two) times daily. 05/01/17  Yes Lama, Marge Duncans, MD  montelukast (SINGULAIR) 10 MG tablet Take 10 mg by mouth at  bedtime. 05/15/17  Yes [provider]  Nutritional Supplements (ENSURE PO) Take 1 Can by mouth daily.   Yes [provider]  omeprazole (PRILOSEC) 20 MG capsule Take 1 capsule (20 mg total) by mouth daily. 01/06/17  Yes Gorsuch, Ni, MD  ondansetron (ZOFRAN) 8 MG tablet Take 1 tablet (8 mg total) by mouth 2 (two) times daily as needed for refractory nausea / vomiting. Start on day 3 after chemotherapy. 06/04/17  Yes Gorsuch, Ni, MD  potassium chloride (K-DUR) 10 MEQ tablet Take 10 mEq by mouth daily. 03/24/16  Yes [provider]    predniSONE (DELTASONE) 20 MG tablet TAKE 1 TABLET(20 MG) BY MOUTH DAILY WITH BREAKFAST Patient taking differently: TAKE 20 MG BY MOUTH EVERY OTHER DAY WITH BREAKFAST ALTERNATING WITH 10 MG EVERY OTHER DAY 06/18/17  Yes Heath Lark, MD  prochlorperazine (COMPAZINE) 10 MG tablet Take 1 tablet (10 mg total) by mouth every 6 (six) hours as needed (Nausea or vomiting). 06/04/17  Yes Gorsuch, Ni, MD  QUEtiapine (SEROQUEL) 25 MG tablet Take 12.5 mg by mouth at bedtime.    Yes [provider]  sertraline (ZOLOFT) 100 MG tablet Take 100 mg by mouth daily. 06/02/17  Yes [provider]  triamterene-hydrochlorothiazide (MAXZIDE-25) 37.5-25 MG tablet Take 0.5 tablets by mouth daily. 05/01/17  Yes Oswald Hillock, MD    Physical Exam: Vitals:   07/18/17 1130 07/18/17 1225 07/18/17 1240 07/18/17 1309  BP: (!) 154/66 (!) 136/123 (!) 141/87 (!) 158/58  Pulse: 95 83 95 79  Resp: (!) 24 (!) 25 18 14   Temp:    98.2 F (36.8 C)  TempSrc:    Oral  SpO2: 96% 96% 98% 99%    Constitutional: NAD, calm, comfortable, pleasant Vitals:   07/18/17 1130 07/18/17 1225 07/18/17 1240 07/18/17 1309  BP: (!) 154/66 (!) 136/123 (!) 141/87 (!) 158/58  Pulse: 95 83 95 79  Resp: (!) 24 (!) 25 18 14   Temp:    98.2 F (36.8 C)  TempSrc:    Oral  SpO2: 96% 96% 98% 99%   Eyes: PERRL, lids and conjunctivae normal ENMT: Mucous membranes are moist. Posterior pharynx clear of any exudate or lesions.Normal dentition.  Neck: normal, supple, no masses, no thyromegaly Respiratory: clear to auscultation bilaterally, no wheezing, no crackles. Normal respiratory effort. No accessory muscle use.  Cardiovascular: Regular rate and rhythm, no murmurs / rubs / gallops. No extremity edema. 2+ pedal pulses. No carotid bruits.  Abdomen: diffuse tenderness to palpation across lower quadrants.  No rebound or guarding. No hepatosplenomegaly. Bowel sounds positive.  Musculoskeletal: no clubbing / cyanosis. No joint deformity upper  and lower extremities. Good ROM, no contractures. Normal muscle tone.  Skin: no rashes, lesions, ulcers. No induration Neurologic: CN 2-12 grossly intact. Sensation intact. Strength 5/5 in all 4.  Psychiatric: Normal judgment and insight. Alert and oriented x 3. Normal mood.   Labs on Admission: I have personally reviewed following labs and imaging studies  CBC: Recent Labs  Lab 07/14/17 1120 07/18/17 0945  WBC 2.4* 4.9  NEUTROABS 0.4* 2.0  HGB 10.7* 11.8*  HCT 32.8* 36.3  MCV 86.2 87.3  PLT 196 270   Basic Metabolic Panel: Recent Labs  Lab 07/14/17 1120 07/18/17 0945  NA 134* 136  K 3.5 3.5  CL 100 98*  CO2 26 26  GLUCOSE 123 103*  BUN 22 18  CREATININE 0.97 0.92  CALCIUM 8.8 8.7*   GFR: Estimated Creatinine Clearance: 38.8 mL/min (by C-G formula based  on SCr of 0.92 mg/dL). Liver Function Tests: Recent Labs  Lab 07/14/17 1120 07/18/17 0945  AST 9 13*  ALT 10 12*  ALKPHOS 47 51  BILITOT 0.5 0.9  PROT 5.5* 6.2*  ALBUMIN 2.7* 3.2*   Recent Labs  Lab 07/18/17 0945  LIPASE 29   No results for input(s): AMMONIA in the last 168 hours. Coagulation Profile: No results for input(s): INR, PROTIME in the last 168 hours. Cardiac Enzymes: No results for input(s): CKTOTAL, CKMB, CKMBINDEX, TROPONINI in the last 168 hours. BNP (last 3 results) No results for input(s): PROBNP in the last 8760 hours. HbA1C: No results for input(s): HGBA1C in the last 72 hours. CBG: No results for input(s): GLUCAP in the last 168 hours. Lipid Profile: No results for input(s): CHOL, HDL, LDLCALC, TRIG, CHOLHDL, LDLDIRECT in the last 72 hours. Thyroid Function Tests: No results for input(s): TSH, T4TOTAL, FREET4, T3FREE, THYROIDAB in the last 72 hours. Anemia Panel: No results for input(s): VITAMINB12, FOLATE, FERRITIN, TIBC, IRON, RETICCTPCT in the last 72 hours. Urine analysis:    Component Value Date/Time   COLORURINE YELLOW 07/18/2017 1315   APPEARANCEUR CLEAR 07/18/2017 1315    LABSPEC >1.046 (H) 07/18/2017 1315   LABSPEC 1.015 11/13/2016 0848   PHURINE 7.0 07/18/2017 1315   GLUCOSEU NEGATIVE 07/18/2017 1315   GLUCOSEU Negative 11/13/2016 0848   HGBUR NEGATIVE 07/18/2017 1315   BILIRUBINUR NEGATIVE 07/18/2017 1315   BILIRUBINUR Negative 11/13/2016 0848   KETONESUR NEGATIVE 07/18/2017 1315   PROTEINUR NEGATIVE 07/18/2017 1315   UROBILINOGEN 0.2 11/13/2016 0848   NITRITE NEGATIVE 07/18/2017 1315   LEUKOCYTESUR NEGATIVE 07/18/2017 1315   LEUKOCYTESUR Large 11/13/2016 0848    Radiological Exams on Admission: Ct Abdomen Pelvis W Contrast  Result Date: 07/18/2017 CLINICAL DATA:  84 year old female with a history of CLL and new crampy abdominal pain EXAM: CT ABDOMEN AND PELVIS WITH CONTRAST TECHNIQUE: Multidetector CT imaging of the abdomen and pelvis was performed using the standard protocol following bolus administration of intravenous contrast. CONTRAST:  58m ISOVUE-300 IOPAMIDOL (ISOVUE-300) INJECTION 61% COMPARISON:  04/30/2017, 10/21/2016, 05/08/2016 FINDINGS: Lower chest: No acute finding of the chest. Calcifications of left main, left anterior descending, circumflex, right coronary arteries. Hepatobiliary: Redemonstration of multiple low-density cystic structures of liver parenchyma, the largest in the left liver measuring 19 mm and 17 mm, most likely benign cysts. Unremarkable gallbladder Pancreas: Unremarkable pancreas Spleen: Low-density lesions at the posterior aspect of the spleen are unchanged compared to prior CT studies, with the largest measuring 9 mm, potentially related to the patient's primary diagnosis of CLL. Adrenals/Urinary Tract: Unremarkable appearance of the adrenal glands. Unremarkable left kidney with no hydronephrosis or nephrolithiasis. Right kidney with no hydronephrosis or nephrolithiasis. Low-density structure at the upper right kidney is unchanged over time, most likely a complex cyst. There are additional low-density lesions which are too  small to characterize. Unremarkable course the bilateral ureters. Unremarkable urinary bladder. Stomach/Bowel: Inflammatory changes of the sigmoid colon and descending colon with no evidence of perforation. No abscess. There are extensive diverticular changes throughout this segment of colon. No evidence of obstruction. Cecum unremarkable.  Normal appendix. Small bowel is decompressed with no transition point. No evidence of obstruction. Unremarkable stomach. Small hiatal hernia. Vascular/Lymphatic: Extensive calcifications of the abdominal aorta. The bilateral iliac and proximal femoral arteries are patent. Calcifications of the mesenteric vasculature, with no evidence of obstruction. Calcifications at the origin the bilateral renal arteries, though remain patent. Redemonstration of extensive lymphadenopathy of the abdomen including mesenteric, periaortic/periaortic nodes, and  pelvic nodes. Overall, the size of lymph nodes has decreased when compared to PET CT of 10/21/2016. Reproductive: Unremarkable appearance of the uterus and adnexa. Other: Fat containing umbilical hernia. Musculoskeletal: Multilevel degenerative changes of the spine. No displaced fracture. IMPRESSION: Acute inflammatory changes of the descending colon and sigmoid colon, compatible with acute diverticulitis/neutropenic enterocolitis in this patient with leukopenia. Extensive lymph nodes within the abdomen/retroperitoneum, which are relatively decreased in size overall when compared to prior PET-CT of July 2018, likely reflecting treatment changes. Aortic atherosclerosis and associated coronary artery disease. Aortic Atherosclerosis (ICD10-I70.0). These results were called by telephone at the time of interpretation on 07/18/2017 at 11:27 am to Dr. Zenovia Jarred , who verbally acknowledged these results. Electronically Signed   By: Corrie Mckusick D.O.   On: 07/18/2017 11:27    EKG: Independently reviewed. pending  Assessment/Plan Active  Problems:   Enterocolitis  Diverticulitis vs Neutropenic Enterocolitis  Abdominal pain:  ANC has actually improved at this point, but was 400 on 4/9.  Imaging with inflammatory changes of the descending colon and sigmoid colon compatible with acute diverticulitis/neutropenic enterocolitis.  UA not c/w infection Zosyn Blood cx Will c/s oncology Pain control   Neutropenia: improved, follow  Chronic Lymphocytic Leukemia:  On chemo with Dr. Alvy Bimler.  Bendamustine.  Most recent treatment held in setting of pancytopenia. Continue prednisone Continue acyclovir   Anemia: mild, follow  CAD: continue ASA, statin  Depression  Insomnia: continue zoloft and seroquel  HTN: continue metop, imdur, maxzide  DVT prophylaxis: lovenox  Code Status: full code  Family Communication: daughter at bedside Disposition Plan: pending improvement Consults called: oncology Admission status: observation    Fayrene Helper MD [facility] Pager [phone]  If 7PM-7AM, please contact night-coverage [email or web address] Password [email or web address]  07/18/2017, 2:34 PM

## 2017-07-18 NOTE — ED Notes (Signed)
Pt requesting more pain meds./ MD made aware

## 2017-07-18 NOTE — ED Notes (Addendum)
Neutropenic precautions in place

## 2017-07-18 NOTE — ED Notes (Signed)
Pt attempted to obtain urine. Pt unable to void at this time. Pt transported to CT at this time. Will attempt to obtain urine after CT

## 2017-07-18 NOTE — Consult Note (Signed)
[place]  [phone] [phone]     ID: Mikinzie Maciejewski Pra DOB: Sep 30, 1926  MR#: [id number]  OBS#:[id number]  Patient Care Team: Lujean Amel, MD as PCP - General (Family Medicine) Chauncey Cruel, MD OTHER MD:  CHIEF COMPLAINT: abdominal pain  CURRENT TREATMENT: pip/tazo, analgesics   HISTORY OF CURRENT ILLNESS: Carla Conway has a long history of CLL and has been variously treated in the past, most recently with bendamustine and prednisone-- today being day 20 cycle 1 of benda. She has been neutropenic but fortunately her [place] is now normal.  She has significant intra-abdominal adenopathy and also a history of diverticular disease. She has been previously admitted for abdominal pain. This was present when the patient saw Dr Alvy Bimler most recently, 07/14/2017. They discussed pain management and OTC analgesics were suggested.  However the pain worsened over the past few days and brought the patient to the ED this AM, where CT abd/pelvis shows leftsided colitis, in the setting of extensive diverticular changes, with no evidence of perforation or obstruction. Intra-abdominal adenopathy is stable/ imnproved. The possibility of typhlitis was suggested and this very elderly patient who lives alone was appropriately admitted and started on pip/tazo, with cultures pending.  We were asked to evaluate and follow  INTERVAL HISTORY: I met with Carla Conway in her hospital room. She was alert and able to communicate well, despite significant hearing deficits. No family in room   REVIEW OF SYSTEMS: Her pain is peri-umbilical and in the lower quadrants primarily. She denies nausea or vomiting, BRBPR or melena. Denies cough, SOB, phlegm or pleurisy. Denies dysuria or frequency. A detailed ROS was otherwise non-contributory  PAST MEDICAL HISTORY: Past Medical History:  Diagnosis Date  . Asthma   . CAD (coronary artery disease)   . Hyperlipidemia   . Hypertension     PAST  SURGICAL HISTORY: Past Surgical History:  Procedure Laterality Date  . CATARACT EXTRACTION    . CORONARY ANGIOPLASTY  10 -15 years ago   stent in [place] ,[place]  . IR FLUORO GUIDE PORT INSERTION RIGHT  10/23/2016  . IR US GUIDE VASC ACCESS RIGHT  10/23/2016    FAMILY HISTORY Family History  Problem Relation Age of Onset  . Cancer Father        colon     SOCIAL HISTORY:  The patient is a widow and lives by herself. She has 5 children, but only one lives in town, her daughter Carla Conway, who teaches Phys Ed at the [employer]: the pt is not sure if she has a HCPOA   HEALTH MAINTENANCE: Social History   Tobacco Use  . Smoking status: Former Smoker    Types: Cigarettes    Start date: 05/18/1983  . Smokeless tobacco: Never Used  Substance Use Topics  . Alcohol use: No  . Drug use: No       Allergies  Allergen Reactions  . Rituximab Other (See Comments)    "She coded"/Upset stomach and blood pressure dropped    Current Facility-Administered Medications  Medication Dose Route Frequency Provider Last Rate Last Dose  . acetaminophen (TYLENOL) tablet 650 mg  650 mg Oral Q6H PRN Elodia Florence., MD      . acyclovir (ZOVIRAX) tablet 400 mg  400 mg Oral Daily Elodia Florence., MD   400 mg at 07/18/17 1504  . aspirin EC tablet 81 mg  81 mg Oral Daily Elodia Florence., MD   81 mg  at 07/18/17 1504  . enoxaparin (LOVENOX) injection 40 mg  40 mg Subcutaneous Q24H Elodia Florence., MD   40 mg at 07/18/17 1505  . HYDROmorphone (DILAUDID) injection 0.5 mg  0.5 mg Intravenous Q4H PRN Elodia Florence., MD   0.5 mg at 07/18/17 1503  . iopamidol (ISOVUE-300) 61 % injection           . isosorbide mononitrate (IMDUR) 24 hr tablet 30 mg  30 mg Oral Daily Elodia Florence., MD   30 mg at 07/18/17 1504  . metoprolol tartrate (LOPRESSOR) tablet 25 mg  25 mg Oral BID Elodia Florence., MD   25 mg at 07/18/17 1504  . oxyCODONE (Oxy  IR/ROXICODONE) immediate release tablet 2.5 mg  2.5 mg Oral Q4H PRN Elodia Florence., MD      . pantoprazole (PROTONIX) EC tablet 40 mg  40 mg Oral Daily Elodia Florence., MD   40 mg at 07/18/17 1504  . piperacillin-tazobactam (ZOSYN) IVPB 3.375 g  3.375 g Intravenous Q8H Adrian Saran, [place]      . potassium chloride (K-DUR) CR tablet 10 mEq  10 mEq Oral Q breakfast Elodia Florence., MD   10 mEq at 07/18/17 1504  . pravastatin (PRAVACHOL) tablet 40 mg  40 mg Oral q1800 Elodia Florence., MD      . Derrill Memo ON 07/19/2017] predniSONE (DELTASONE) tablet 10 mg  10 mg Oral Hetty Blend., MD      . predniSONE (DELTASONE) tablet 20 mg  20 mg Oral Hetty Blend., MD   20 mg at 07/18/17 1504  . QUEtiapine (SEROQUEL) tablet 12.5 mg  12.5 mg Oral QHS Elodia Florence., MD      . sertraline (ZOLOFT) tablet 50 mg  50 mg Oral Daily Elodia Florence., MD   50 mg at 07/18/17 1505  . triamterene-hydrochlorothiazide (MAXZIDE-25) 37.5-25 MG per tablet 0.5 tablet  0.5 tablet Oral Daily Elodia Florence., MD   0.5 tablet at 07/18/17 1505    OBJECTIVE: elderly White woman examined in bed  Vitals:   07/18/17 1240 07/18/17 1309  BP: (!) 141/87 (!) 158/58  Pulse: 95 79  Resp: 18 14  Temp:  98.2 F (36.8 C)  SpO2: 98% 99%     There is no height or weight on file to calculate BMI.   Wt Readings from Last 3 Encounters:  07/14/17 141 lb 1.6 oz (64 kg)  06/30/17 139 lb 14.4 oz (63.5 kg)  06/16/17 140 lb 3.2 oz (63.6 kg)    Ocular: Sclerae unicteric, EOMs intact Lungs no rales or rhonchi, auscultated anterolaterally Heart regular rate and rhythm Abd soft, tender diffusely but w/o rebound, no massed palpated, positive bowel sounds MSK no focal spinal tenderness, no joint edema Neuro: non-focal, well-oriented, appropriate affect Breasts: deferred   LAB RESULTS:  CMP     Component Value Date/Time   NA 136 07/18/2017 0945   NA 138 02/17/2017  0810   K 3.5 07/18/2017 0945   K 3.7 02/17/2017 0810   CL 98 (L) 07/18/2017 0945   CO2 26 07/18/2017 0945   CO2 27 02/17/2017 0810   GLUCOSE 103 (H) 07/18/2017 0945   GLUCOSE 92 02/17/2017 0810   BUN 18 07/18/2017 0945   BUN 16.4 02/17/2017 0810   CREATININE 0.92 07/18/2017 0945   CREATININE 1.1 02/17/2017 0810   CALCIUM 8.7 (L) 07/18/2017 0945  CALCIUM 9.3 02/17/2017 0810   PROT 6.2 (L) 07/18/2017 0945   PROT 6.4 02/17/2017 0810   ALBUMIN 3.2 (L) 07/18/2017 0945   ALBUMIN 3.9 02/17/2017 0810   AST 13 (L) 07/18/2017 0945   AST 13 02/17/2017 0810   ALT 12 (L) 07/18/2017 0945   ALT 13 02/17/2017 0810   ALKPHOS 51 07/18/2017 0945   ALKPHOS 74 02/17/2017 0810   BILITOT 0.9 07/18/2017 0945   BILITOT 0.60 02/17/2017 0810   GFRNONAA 54 (L) 07/18/2017 0945   GFRAA >60 07/18/2017 0945    No results found for: TOTALPROTELP, ALBUMINELP, A1GS, A2GS, BETS, BETA2SER, GAMS, MSPIKE, SPEI  No results found for: KPAFRELGTCHN, LAMBDASER, KAPLAMBRATIO  Lab Results  Component Value Date   WBC 4.9 07/18/2017   NEUTROABS 2.0 07/18/2017   HGB 11.8 (L) 07/18/2017   HCT 36.3 07/18/2017   MCV 87.3 07/18/2017   PLT 244 07/18/2017    @LASTCHEMISTRY @  No results found for: LABCA2  No components found for: ONGEXB284  No results for input(s): INR in the last 168 hours.  No results found for: LABCA2  No results found for: XLK440  No results found for: NUU725  No results found for: DGU440  No results found for: CA2729  No components found for: HGQUANT  No results found for: CEA1 / No results found for: CEA1   No results found for: AFPTUMOR  No results found for: CHROMOGRNA  No results found for: PSA1  Admission on 07/18/2017  Component Date Value Ref Range Status  . Lipase 07/18/2017 29  11 - 51 U/L Final   Performed at [facility], [place] [address], [place], [place] [address]  . Sodium 07/18/2017 136  135 - 145 mmol/L Final  . Potassium 07/18/2017 3.5   3.5 - 5.1 mmol/L Final  . Chloride 07/18/2017 98* 101 - 111 mmol/L Final  . CO2 07/18/2017 26  22 - 32 mmol/L Final  . Glucose, Bld 07/18/2017 103* 65 - 99 mg/dL Final  . BUN 07/18/2017 18  6 - 20 mg/dL Final  . Creatinine, Ser 07/18/2017 0.92  0.44 - 1.00 mg/dL Final  . Calcium 07/18/2017 8.7* 8.9 - 10.3 mg/dL Final  . Total Protein 07/18/2017 6.2* 6.5 - 8.1 g/dL Final  . Albumin 07/18/2017 3.2* 3.5 - 5.0 g/dL Final  . AST 07/18/2017 13* 15 - 41 U/L Final  . ALT 07/18/2017 12* 14 - 54 U/L Final  . Alkaline Phosphatase 07/18/2017 51  38 - 126 U/L Final  . Total Bilirubin 07/18/2017 0.9  0.3 - 1.2 mg/dL Final  . GFR calc non Af Amer 07/18/2017 54* >60 mL/min Final  . GFR calc Af Amer 07/18/2017 >60  >60 mL/min Final   Comment: (NOTE) The eGFR has been calculated using the CKD EPI equation. This calculation has not been validated in all clinical situations. eGFR's persistently <60 mL/min signify possible Chronic Kidney Disease.   Georgiann Hahn gap 07/18/2017 12  5 - 15 Final   Performed at [facility], [place] [address], [place], [place] [address]  . WBC 07/18/2017 4.9  4.0 - 10.5 K/uL Final  . RBC 07/18/2017 4.16  3.87 - 5.11 MIL/uL Final  . Hemoglobin 07/18/2017 11.8* 12.0 - 15.0 g/dL Final  . HCT 07/18/2017 36.3  36.0 - 46.0 % Final  . MCV 07/18/2017 87.3  78.0 - 100.0 fL Final  . MCH 07/18/2017 28.4  26.0 - 34.0 pg Final  . MCHC 07/18/2017 32.5  30.0 - 36.0 g/dL Final  .  RDW 07/18/2017 16.1* 11.5 - 15.5 % Final  . Platelets 07/18/2017 244  150 - 400 K/uL Final   Performed at [facility], [place] [address], [place], [place] [address]  . Color, Urine 07/18/2017 YELLOW  YELLOW Final  . APPearance 07/18/2017 CLEAR  CLEAR Final  . Specific Gravity, Urine 07/18/2017 >1.046* 1.005 - 1.030 Final  . pH 07/18/2017 7.0  5.0 - 8.0 Final  . Glucose, UA 07/18/2017 NEGATIVE  NEGATIVE mg/dL Final  . Hgb urine dipstick 07/18/2017 NEGATIVE  NEGATIVE Final  .  Bilirubin Urine 07/18/2017 NEGATIVE  NEGATIVE Final  . Ketones, ur 07/18/2017 NEGATIVE  NEGATIVE mg/dL Final  . Protein, ur 07/18/2017 NEGATIVE  NEGATIVE mg/dL Final  . Nitrite 07/18/2017 NEGATIVE  NEGATIVE Final  . Leukocytes, UA 07/18/2017 NEGATIVE  NEGATIVE Final   Performed at [place] [address] [place], [place] [address]  . Lactic Acid, Venous 07/18/2017 0.53  0.5 - 1.9 mmol/L Final  . Neutrophils Relative % 07/18/2017 39  % Final  . Lymphocytes Relative 07/18/2017 42  % Final  . Monocytes Relative 07/18/2017 17  % Final  . Eosinophils Relative 07/18/2017 1  % Final  . Basophils Relative 07/18/2017 0  % Final  . Band Neutrophils 07/18/2017 0  % Final  . Metamyelocytes Relative 07/18/2017 0  % Final  . Myelocytes 07/18/2017 0  % Final  . Promyelocytes Relative 07/18/2017 1  % Final  . Blasts 07/18/2017 0  % Final  . nRBC 07/18/2017 0  0 /100 WBC Final  . Other 07/18/2017 0  % Final  . Neutro Abs 07/18/2017 2.0  1.7 - 7.7 K/uL Final  . Lymphs Abs 07/18/2017 2.0  0.7 - 4.0 K/uL Final  . Monocytes Absolute 07/18/2017 0.8  0.1 - 1.0 K/uL Final  . Eosinophils Absolute 07/18/2017 0.1  0.0 - 0.7 K/uL Final  . Basophils Absolute 07/18/2017 0.0  0.0 - 0.1 K/uL Final  . RBC Morphology 07/18/2017 POLYCHROMASIA PRESENT   Final   Performed at [facility], [place] [address], [place], [place] [address]    (this displays the last labs from the last 3 days)  No results found for: TOTALPROTELP, ALBUMINELP, A1GS, A2GS, BETS, BETA2SER, GAMS, MSPIKE, SPEI (this displays SPEP labs)  No results found for: KPAFRELGTCHN, LAMBDASER, KAPLAMBRATIO (kappa/lambda light chains)  No results found for: HGBA, HGBA2QUANT, HGBFQUANT, HGBSQUAN (Hemoglobinopathy evaluation)   Lab Results  Component Value Date   LDH 112 (L) 06/30/2017    No results found for: IRON, TIBC, IRONPCTSAT (Iron and TIBC)  No results found for: FERRITIN  Urinalysis    Component  Value Date/Time   COLORURINE YELLOW 07/18/2017 1315   APPEARANCEUR CLEAR 07/18/2017 1315   LABSPEC >1.046 (H) 07/18/2017 1315   LABSPEC 1.015 11/13/2016 0848   PHURINE 7.0 07/18/2017 1315   GLUCOSEU NEGATIVE 07/18/2017 1315   GLUCOSEU Negative 11/13/2016 0848   HGBUR NEGATIVE 07/18/2017 1315   BILIRUBINUR NEGATIVE 07/18/2017 1315   BILIRUBINUR Negative 11/13/2016 0848   KETONESUR NEGATIVE 07/18/2017 1315   PROTEINUR NEGATIVE 07/18/2017 1315   UROBILINOGEN 0.2 11/13/2016 0848   NITRITE NEGATIVE 07/18/2017 1315   LEUKOCYTESUR NEGATIVE 07/18/2017 1315   LEUKOCYTESUR Large 11/13/2016 0848     STUDIES: Ct Abdomen Pelvis W Contrast  Result Date: 07/18/2017 CLINICAL DATA:  84 year old female with a history of CLL and new crampy abdominal pain EXAM: CT ABDOMEN AND PELVIS WITH CONTRAST TECHNIQUE: Multidetector CT imaging of the abdomen and pelvis was performed using the  standard protocol following bolus administration of intravenous contrast. CONTRAST:  66m ISOVUE-300 IOPAMIDOL (ISOVUE-300) INJECTION 61% COMPARISON:  04/30/2017, 10/21/2016, 05/08/2016 FINDINGS: Lower chest: No acute finding of the chest. Calcifications of left main, left anterior descending, circumflex, right coronary arteries. Hepatobiliary: Redemonstration of multiple low-density cystic structures of liver parenchyma, the largest in the left liver measuring 19 mm and 17 mm, most likely benign cysts. Unremarkable gallbladder Pancreas: Unremarkable pancreas Spleen: Low-density lesions at the posterior aspect of the spleen are unchanged compared to prior CT studies, with the largest measuring 9 mm, potentially related to the patient's primary diagnosis of CLL. Adrenals/Urinary Tract: Unremarkable appearance of the adrenal glands. Unremarkable left kidney with no hydronephrosis or nephrolithiasis. Right kidney with no hydronephrosis or nephrolithiasis. Low-density structure at the upper right kidney is unchanged over time, most likely a  complex cyst. There are additional low-density lesions which are too small to characterize. Unremarkable course the bilateral ureters. Unremarkable urinary bladder. Stomach/Bowel: Inflammatory changes of the sigmoid colon and descending colon with no evidence of perforation. No abscess. There are extensive diverticular changes throughout this segment of colon. No evidence of obstruction. Cecum unremarkable.  Normal appendix. Small bowel is decompressed with no transition point. No evidence of obstruction. Unremarkable stomach. Small hiatal hernia. Vascular/Lymphatic: Extensive calcifications of the abdominal aorta. The bilateral iliac and proximal femoral arteries are patent. Calcifications of the mesenteric vasculature, with no evidence of obstruction. Calcifications at the origin the bilateral renal arteries, though remain patent. Redemonstration of extensive lymphadenopathy of the abdomen including mesenteric, periaortic/periaortic nodes, and pelvic nodes. Overall, the size of lymph nodes has decreased when compared to PET CT of 10/21/2016. Reproductive: Unremarkable appearance of the uterus and adnexa. Other: Fat containing umbilical hernia. Musculoskeletal: Multilevel degenerative changes of the spine. No displaced fracture. IMPRESSION: Acute inflammatory changes of the descending colon and sigmoid colon, compatible with acute diverticulitis/neutropenic enterocolitis in this patient with leukopenia. Extensive lymph nodes within the abdomen/retroperitoneum, which are relatively decreased in size overall when compared to prior PET-CT of July 2018, likely reflecting treatment changes. Aortic atherosclerosis and associated coronary artery disease. Aortic Atherosclerosis (ICD10-I70.0). These results were called by telephone at the time of interpretation on 07/18/2017 at 11:27 am to Dr. CZenovia Jarred, who verbally acknowledged these results. Electronically Signed   By: JCorrie MckusickD.O.   On: 07/18/2017 11:27     ASSESSMENT: 84y.o. [place] woman with a history of chronic lymphoid leukemia definitively diagnosed 09/12/2016, currently being treated with bendamustine and prednisone, admitted with abdominal pain, condern re. Typhlitis  (1) currently day 20 cycle 1 bendamustine; continues on prednisone at 20 mg/ d  (a) currently not neutropenic  (2) left sided colitis, no evidence of pneumatosis, perforation or abscess;   (a) day 1 pip/tazo  (b) cultures pending  (3) pain: low tolerance to narcotics per patient-- will change to tylenol, toradol and follow  PLAN: MShezamay well have an early case of typhlitis, tough there was no pneumatosis noted on scan. The good news is her counts are recovering and I am hopeful with antibiotics, supportive care and analgesics she will do well and likely be discharge in a few days, if cultures negative  She is elderly, likely to tolerate narcotics poorly and has refused narcotics in past. We will keep the oxycodone as a last resort, d/c the dilaudid, and make tylenol and low-dose toradol RTC.  I appreciate your excellent care to this patient. Will follow with you  GChauncey Cruel MD   07/18/2017 3:58  PM Medical Oncology and Hematology [facility] [address] [place], [place] [address] Tel. [phone]    Fax. [phone]

## 2017-07-18 NOTE — Progress Notes (Signed)
Pharmacy Antibiotic Note  Carla Conway is a 84 y.o. female admitted on 07/18/2017 with IAI.  Pharmacy has been consulted for Zosyn dosing.  Plan: Zosyn 3.375g IV q8h (4 hour infusion). Will sign off    Temp (24hrs), Avg:97.9 F (36.6 C), Min:97.9 F (36.6 C), Max:97.9 F (36.6 C)  Recent Labs  Lab 07/14/17 1120 07/18/17 0945 07/18/17 1144  WBC 2.4* 4.9  --   CREATININE 0.97 0.92  --   LATICACIDVEN  --   --  0.53    Estimated Creatinine Clearance: 38.8 mL/min (by C-G formula based on SCr of 0.92 mg/dL).    Allergies  Allergen Reactions  . Rituximab Other (See Comments)    "She coded"/Upset stomach and blood pressure dropped     Thank you for allowing pharmacy to be a part of this patient's care.   Adrian Saran, PharmD, BCPS Pager [phone] 07/18/2017 12:09 PM

## 2017-07-19 ENCOUNTER — Other Ambulatory Visit: Payer: Self-pay

## 2017-07-19 DIAGNOSIS — C9190 Lymphoid leukemia, unspecified not having achieved remission: Secondary | ICD-10-CM

## 2017-07-19 DIAGNOSIS — I10 Essential (primary) hypertension: Secondary | ICD-10-CM

## 2017-07-19 DIAGNOSIS — N179 Acute kidney failure, unspecified: Secondary | ICD-10-CM | POA: Diagnosis not present

## 2017-07-19 DIAGNOSIS — D708 Other neutropenia: Secondary | ICD-10-CM

## 2017-07-19 DIAGNOSIS — C9110 Chronic lymphocytic leukemia of B-cell type not having achieved remission: Secondary | ICD-10-CM | POA: Diagnosis not present

## 2017-07-19 DIAGNOSIS — K529 Noninfective gastroenteritis and colitis, unspecified: Secondary | ICD-10-CM

## 2017-07-19 DIAGNOSIS — R531 Weakness: Secondary | ICD-10-CM | POA: Diagnosis not present

## 2017-07-19 DIAGNOSIS — G893 Neoplasm related pain (acute) (chronic): Secondary | ICD-10-CM | POA: Diagnosis not present

## 2017-07-19 LAB — CBC
HCT: 31 % — ABNORMAL LOW (ref 36.0–46.0)
Hemoglobin: 10.2 g/dL — ABNORMAL LOW (ref 12.0–15.0)
MCH: 28.8 pg (ref 26.0–34.0)
MCHC: 32.9 g/dL (ref 30.0–36.0)
MCV: 87.6 fL (ref 78.0–100.0)
PLATELETS: 217 10*3/uL (ref 150–400)
RBC: 3.54 MIL/uL — AB (ref 3.87–5.11)
RDW: 16.2 % — ABNORMAL HIGH (ref 11.5–15.5)
WBC: 3.1 10*3/uL — AB (ref 4.0–10.5)

## 2017-07-19 LAB — COMPREHENSIVE METABOLIC PANEL
ALT: 12 U/L — AB (ref 14–54)
AST: 11 U/L — ABNORMAL LOW (ref 15–41)
Albumin: 2.9 g/dL — ABNORMAL LOW (ref 3.5–5.0)
Alkaline Phosphatase: 45 U/L (ref 38–126)
Anion gap: 12 (ref 5–15)
BUN: 21 mg/dL — ABNORMAL HIGH (ref 6–20)
CALCIUM: 8.3 mg/dL — AB (ref 8.9–10.3)
CHLORIDE: 100 mmol/L — AB (ref 101–111)
CO2: 23 mmol/L (ref 22–32)
CREATININE: 1.05 mg/dL — AB (ref 0.44–1.00)
GFR, EST AFRICAN AMERICAN: 53 mL/min — AB (ref >60)
GFR, EST NON AFRICAN AMERICAN: 46 mL/min — AB (ref >60)
Glucose, Bld: 108 mg/dL — ABNORMAL HIGH (ref 65–99)
Potassium: 3.8 mmol/L (ref 3.5–5.1)
Sodium: 135 mmol/L (ref 135–145)
TOTAL PROTEIN: 5.4 g/dL — AB (ref 6.5–8.1)
Total Bilirubin: 0.6 mg/dL (ref 0.3–1.2)

## 2017-07-19 LAB — MAGNESIUM: MAGNESIUM: 1.8 mg/dL (ref 1.7–2.4)

## 2017-07-19 MED ORDER — SODIUM CHLORIDE 0.9 % IV SOLN
INTRAVENOUS | Status: DC
  Administered 2017-07-19 – 2017-07-20 (×2): via INTRAVENOUS

## 2017-07-19 MED ORDER — GUAIFENESIN-DM 100-10 MG/5ML PO SYRP: 5.0000 mL | ORAL_SOLUTION | ORAL | Status: DC | PRN

## 2017-07-19 MED ORDER — IPRATROPIUM-ALBUTEROL 0.5-2.5 (3) MG/3ML IN SOLN: 3.0000 mL | RESPIRATORY_TRACT | Status: DC | PRN

## 2017-07-19 MED ORDER — TRAMADOL HCL 50 MG PO TABS
50.0000 mg | ORAL_TABLET | Freq: Four times a day (QID) | ORAL | Status: DC
  Administered 2017-07-19 – 2017-07-23 (×14): 50 mg via ORAL
  Filled 2017-07-19 (×14): qty 1

## 2017-07-19 MED ORDER — IPRATROPIUM-ALBUTEROL 0.5-2.5 (3) MG/3ML IN SOLN
3.0000 mL | Freq: Four times a day (QID) | RESPIRATORY_TRACT | Status: DC
  Filled 2017-07-19: qty 3

## 2017-07-19 MED ORDER — TRAMADOL HCL 50 MG PO TABS
100.0000 mg | ORAL_TABLET | Freq: Four times a day (QID) | ORAL | Status: DC | PRN
  Administered 2017-07-19: 100 mg via ORAL
  Filled 2017-07-19: qty 2

## 2017-07-19 MED ORDER — LORATADINE 10 MG PO TABS
10.0000 mg | ORAL_TABLET | Freq: Every day | ORAL | Status: DC
  Administered 2017-07-19 – 2017-07-23 (×5): 10 mg via ORAL
  Filled 2017-07-19 (×5): qty 1

## 2017-07-19 NOTE — Progress Notes (Signed)
PT states she is not having trouble breathing and wishes to not take scheduled treatments but would like the flexibility of PRN if needed. She does take Albuterol at home for Asthma. I encouraged PT to call for RT if she feels like she would take a treatment. RN aware.

## 2017-07-19 NOTE — Progress Notes (Signed)
PROGRESS NOTE    Carla Conway Pra  GMW:[id number] DOB: July 02, 1926 DOA: 07/18/2017 PCP: Lujean Amel, MD   Brief Narrative:  Carla Conway is a 84 y.o. female with medical history significant of coronary artery disease, hypertension, CLL presented with abdominal pain imaging concerning for diverticulitis vs enterocolitis.     Assessment & Plan:   Active Problems:   Enterocolitis   Diverticulitis and Enterocolitis: Probably secondary to neutropenia and infection.  abd pain is persistent, nausea and vomiting has improved  Resume IV zosyn, gentle hydration.  Follow  Up blood cultures.  Appreciate oncology recommendations.     CLL: Follows up with Dr Alvy Bimler .  Resume prednisone and acyclovir.    CAD: No chest pain or sob.   Depression and insomnia: Resume zoloft and seroquel.    Hypertension: Well controlled.  Resume home meds metoprolol and imdur.    Acute renal failure:  Probably from dehydration and diarrhea, gently hydrate and repeat renal parameters in am.    Cough with productive sputum with some wheezing posteriorly.  Sputum cultures.  Afebrile, normal wbc count.    Normocytic anemia: Hemoglobin stable at 10.    DVT prophylaxis: scd's Code Status: full code.  Family Communication: daughter at bedside.  Disposition Plan: pending resolution of diverticulitis    Consultants:    Oncology.   Procedures: none.   Antimicrobials: zosyn   Subjective: Requesting pain meds for abdominal pain.   Objective: Vitals:   07/19/17 0500 07/19/17 0937 07/19/17 1000 07/19/17 1349  BP:  (!) 153/69  (!) 162/67  Pulse:  74  75  Resp:  14  14  Temp:  99 F (37.2 C)  98.6 F (37 C)  TempSrc:  Oral  Oral  SpO2:  95%  97%  Weight: 67.7 kg (149 lb 4 oz)  67.6 kg (149 lb)   Height:   5' 6"  (1.676 m)     Intake/Output Summary (Last 24 hours) at 07/19/2017 1608 Last data filed at 07/19/2017 1351 Gross per 24 hour  Intake 1010 ml  Output 200 ml  Net 810 ml    Filed Weights   07/19/17 0500 07/19/17 1000  Weight: 67.7 kg (149 lb 4 oz) 67.6 kg (149 lb)    Examination:  General exam: Appears calm and comfortable  Respiratory system: Clear to auscultation. Respiratory effort normal. Cardiovascular system: S1 & S2 heard, RRR. No JVD, murmurs, rubs, gallops or clicks. No pedal edema. Gastrointestinal system: Abdomen is soft non distended, tender in the lower quadrant bowel sounds heard. . Central nervous system: Alert and oriented. No focal neurological deficits. Extremities: Symmetric 5 x 5 power. Skin: No rashes, lesions or ulcers Psychiatry: Judgement and insight appear normal. Mood & affect appropriate.     Data Reviewed: I have personally reviewed following labs and imaging studies  CBC: Recent Labs  Lab 07/14/17 1120 07/18/17 0945 07/19/17 0502  WBC 2.4* 4.9 3.1*  NEUTROABS 0.4* 2.0  --   HGB 10.7* 11.8* 10.2*  HCT 32.8* 36.3 31.0*  MCV 86.2 87.3 87.6  PLT 196 244 440   Basic Metabolic Panel: Recent Labs  Lab 07/14/17 1120 07/18/17 0945 07/19/17 0502  NA 134* 136 135  K 3.5 3.5 3.8  CL 100 98* 100*  CO2 26 26 23   GLUCOSE 123 103* 108*  BUN 22 18 21*  CREATININE 0.97 0.92 1.05*  CALCIUM 8.8 8.7* 8.3*  MG  --   --  1.8   GFR: Estimated Creatinine Clearance: 34 mL/min (A) (by  C-G formula based on SCr of 1.05 mg/dL (H)). Liver Function Tests: Recent Labs  Lab 07/14/17 1120 07/18/17 0945 07/19/17 0502  AST 9 13* 11*  ALT 10 12* 12*  ALKPHOS 47 51 45  BILITOT 0.5 0.9 0.6  PROT 5.5* 6.2* 5.4*  ALBUMIN 2.7* 3.2* 2.9*   Recent Labs  Lab 07/18/17 0945  LIPASE 29   No results for input(s): AMMONIA in the last 168 hours. Coagulation Profile: No results for input(s): INR, PROTIME in the last 168 hours. Cardiac Enzymes: No results for input(s): CKTOTAL, CKMB, CKMBINDEX, TROPONINI in the last 168 hours. BNP (last 3 results) No results for input(s): PROBNP in the last 8760 hours. HbA1C: No results for  input(s): HGBA1C in the last 72 hours. CBG: No results for input(s): GLUCAP in the last 168 hours. Lipid Profile: No results for input(s): CHOL, HDL, LDLCALC, TRIG, CHOLHDL, LDLDIRECT in the last 72 hours. Thyroid Function Tests: No results for input(s): TSH, T4TOTAL, FREET4, T3FREE, THYROIDAB in the last 72 hours. Anemia Panel: No results for input(s): VITAMINB12, FOLATE, FERRITIN, TIBC, IRON, RETICCTPCT in the last 72 hours. Sepsis Labs: Recent Labs  Lab 07/18/17 1144  LATICACIDVEN 0.53    Recent Results (from the past 240 hour(s))  Culture, blood (routine x 2)     Status: None (Preliminary result)   Collection Time: 07/18/17 11:42 AM  Result Value Ref Range Status   Specimen Description BLOOD LEFT ANTECUBITAL  Final   Special Requests   Final    BOTTLES DRAWN AEROBIC AND ANAEROBIC Blood Culture adequate volume Performed at [facility] [address] [place], [place] [address]    Culture   Final    NO GROWTH < 24 HOURS Performed at [facility] [address], [place], [place] [address]    Report Status PENDING  Incomplete  Culture, blood (routine x 2)     Status: None (Preliminary result)   Collection Time: 07/18/17 11:47 AM  Result Value Ref Range Status   Specimen Description BLOOD RIGHT ANTECUBITAL  Final   Special Requests   Final    BOTTLES DRAWN AEROBIC AND ANAEROBIC Blood Culture adequate volume Performed at [facility] [address] [place], [place] [address]    Culture   Final    NO GROWTH < 24 HOURS Performed at [facility] [address] [place], [place] [address]    Report Status PENDING  Incomplete  Culture, expectorated sputum-assessment     Status: None (Preliminary result)   Collection Time: 07/19/17  9:47 AM  Result Value Ref Range Status   Specimen Description SPUTUM  Final   Special Requests NONE  Final   Sputum evaluation   Final    Sputum specimen not acceptable for testing.  Please  recollect.   NOTIFIED K.MOOREFIELD RN 07/19/2017 1238 JR Performed at [facility], [place] [address], [place], [place] [address]    Report Status PENDING  Incomplete         Radiology Studies: Ct Abdomen Pelvis W Contrast  Result Date: 07/18/2017 CLINICAL DATA:  84 year old female with a history of CLL and new crampy abdominal pain EXAM: CT ABDOMEN AND PELVIS WITH CONTRAST TECHNIQUE: Multidetector CT imaging of the abdomen and pelvis was performed using the standard protocol following bolus administration of intravenous contrast. CONTRAST:  69m ISOVUE-300 IOPAMIDOL (ISOVUE-300) INJECTION 61% COMPARISON:  04/30/2017, 10/21/2016, 05/08/2016 FINDINGS: Lower chest: No acute finding of the chest. Calcifications of left main, left anterior descending,  circumflex, right coronary arteries. Hepatobiliary: Redemonstration of multiple low-density cystic structures of liver parenchyma, the largest in the left liver measuring 19 mm and 17 mm, most likely benign cysts. Unremarkable gallbladder Pancreas: Unremarkable pancreas Spleen: Low-density lesions at the posterior aspect of the spleen are unchanged compared to prior CT studies, with the largest measuring 9 mm, potentially related to the patient's primary diagnosis of CLL. Adrenals/Urinary Tract: Unremarkable appearance of the adrenal glands. Unremarkable left kidney with no hydronephrosis or nephrolithiasis. Right kidney with no hydronephrosis or nephrolithiasis. Low-density structure at the upper right kidney is unchanged over time, most likely a complex cyst. There are additional low-density lesions which are too small to characterize. Unremarkable course the bilateral ureters. Unremarkable urinary bladder. Stomach/Bowel: Inflammatory changes of the sigmoid colon and descending colon with no evidence of perforation. No abscess. There are extensive diverticular changes throughout this segment of colon. No evidence of obstruction. Cecum  unremarkable.  Normal appendix. Small bowel is decompressed with no transition point. No evidence of obstruction. Unremarkable stomach. Small hiatal hernia. Vascular/Lymphatic: Extensive calcifications of the abdominal aorta. The bilateral iliac and proximal femoral arteries are patent. Calcifications of the mesenteric vasculature, with no evidence of obstruction. Calcifications at the origin the bilateral renal arteries, though remain patent. Redemonstration of extensive lymphadenopathy of the abdomen including mesenteric, periaortic/periaortic nodes, and pelvic nodes. Overall, the size of lymph nodes has decreased when compared to PET CT of 10/21/2016. Reproductive: Unremarkable appearance of the uterus and adnexa. Other: Fat containing umbilical hernia. Musculoskeletal: Multilevel degenerative changes of the spine. No displaced fracture. IMPRESSION: Acute inflammatory changes of the descending colon and sigmoid colon, compatible with acute diverticulitis/neutropenic enterocolitis in this patient with leukopenia. Extensive lymph nodes within the abdomen/retroperitoneum, which are relatively decreased in size overall when compared to prior PET-CT of July 2018, likely reflecting treatment changes. Aortic atherosclerosis and associated coronary artery disease. Aortic Atherosclerosis (ICD10-I70.0). These results were called by telephone at the time of interpretation on 07/18/2017 at 11:27 am to Dr. Zenovia Jarred , who verbally acknowledged these results. Electronically Signed   By: Corrie Mckusick D.O.   On: 07/18/2017 11:27        Scheduled Meds: . acetaminophen  500 mg Oral QID  . acyclovir  400 mg Oral Daily  . aspirin EC  81 mg Oral Daily  . enoxaparin (LOVENOX) injection  40 mg Subcutaneous Q24H  . isosorbide mononitrate  30 mg Oral Daily  . ketorolac  7.5 mg Intravenous Q8H  . loratadine  10 mg Oral Daily  . metoprolol tartrate  25 mg Oral BID  . pantoprazole  40 mg Oral Daily  . potassium  chloride  10 mEq Oral Q breakfast  . pravastatin  40 mg Oral q1800  . predniSONE  10 mg Oral QODAY  . predniSONE  20 mg Oral QODAY  . QUEtiapine  12.5 mg Oral QHS  . sertraline  50 mg Oral Daily  . traMADol  50 mg Oral Q6H  . triamterene-hydrochlorothiazide  0.5 tablet Oral Daily   Continuous Infusions: . sodium chloride 75 mL/hr at 07/19/17 0948  . piperacillin-tazobactam (ZOSYN)  IV 3.375 g (07/19/17 1340)     LOS: 0 days    Time spent: 35 minutes.     Hosie Poisson, MD [facility] Pager [phone] If 7PM-7AM, please contact night-coverage [email or web address] Password [facility] 07/19/2017, 4:08 PM

## 2017-07-19 NOTE — Progress Notes (Signed)
Carla Conway   DOB:Apr 22, 1926   EQ#:[id number]   WLN#:[id number]  Subjective:  In bed all day yesterday; had BM about 2 hours ago, normal in color and consistency; pain is only "10% better" on current meds; no family in room   Objective: elderly White woman examined in bed Vitals:   07/19/17 0434 07/19/17 0937  BP: (!) 152/69 (!) 153/69  Pulse: 65 74  Resp: 18 14  Temp: 98.6 F (37 C) 99 F (37.2 C)  SpO2: 96% 95%    Body mass index is 24.05 kg/m.  Intake/Output Summary (Last 24 hours) at 07/19/2017 1209 Last data filed at 07/19/2017 0619 Gross per 24 hour  Intake 290 ml  Output 450 ml  Net -160 ml     Sclerae unicteric  Lungs no rales or wheezes--auscultated anterolaterally  Heart regular rate and rhythm  Abdomen soft, mildly tender, no rebound, +BS  Neuro nonfocal, positive affect    CBG (last 3)  No results for input(s): GLUCAP in the last 72 hours.   Labs:  Lab Results  Component Value Date   WBC 3.1 (L) 07/19/2017   HGB 10.2 (L) 07/19/2017   HCT 31.0 (L) 07/19/2017   MCV 87.6 07/19/2017   PLT 217 07/19/2017   NEUTROABS 2.0 07/18/2017    @LASTCHEMISTRY @  Urine Studies No results for input(s): UHGB, CRYS in the last 72 hours.  Invalid input(s): UACOL, UAPR, USPG, UPH, UTP, UGL, UKET, UBIL, UNIT, UROB, ULEU, UEPI, UWBC, URBC, UBAC, CAST, UCOM, BILUA  Basic Metabolic Panel: Recent Labs  Lab 07/14/17 1120 07/18/17 0945 07/19/17 0502  NA 134* 136 135  K 3.5 3.5 3.8  CL 100 98* 100*  CO2 26 26 23   GLUCOSE 123 103* 108*  BUN 22 18 21*  CREATININE 0.97 0.92 1.05*  CALCIUM 8.8 8.7* 8.3*  MG  --   --  1.8   GFR Estimated Creatinine Clearance: 34 mL/min (A) (by C-G formula based on SCr of 1.05 mg/dL (H)). Liver Function Tests: Recent Labs  Lab 07/14/17 1120 07/18/17 0945 07/19/17 0502  AST 9 13* 11*  ALT 10 12* 12*  ALKPHOS 47 51 45  BILITOT 0.5 0.9 0.6  PROT 5.5* 6.2* 5.4*  ALBUMIN 2.7* 3.2* 2.9*   Recent Labs  Lab 07/18/17 0945  LIPASE  29   No results for input(s): AMMONIA in the last 168 hours. Coagulation profile No results for input(s): INR, PROTIME in the last 168 hours.  CBC: Recent Labs  Lab 07/14/17 1120 07/18/17 0945 07/19/17 0502  WBC 2.4* 4.9 3.1*  NEUTROABS 0.4* 2.0  --   HGB 10.7* 11.8* 10.2*  HCT 32.8* 36.3 31.0*  MCV 86.2 87.3 87.6  PLT 196 244 217   Cardiac Enzymes: No results for input(s): CKTOTAL, CKMB, CKMBINDEX, TROPONINI in the last 168 hours. BNP: Invalid input(s): POCBNP CBG: No results for input(s): GLUCAP in the last 168 hours. D-Dimer No results for input(s): DDIMER in the last 72 hours. Hgb A1c No results for input(s): HGBA1C in the last 72 hours. Lipid Profile No results for input(s): CHOL, HDL, LDLCALC, TRIG, CHOLHDL, LDLDIRECT in the last 72 hours. Thyroid function studies No results for input(s): TSH, T4TOTAL, T3FREE, THYROIDAB in the last 72 hours.  Invalid input(s): FREET3 Anemia work up No results for input(s): VITAMINB12, FOLATE, FERRITIN, TIBC, IRON, RETICCTPCT in the last 72 hours. Microbiology Recent Results (from the past 240 hour(s))  Culture, blood (routine x 2)     Status: None (Preliminary result)   Collection Time:  07/18/17 11:42 AM  Result Value Ref Range Status   Specimen Description BLOOD LEFT ANTECUBITAL  Final   Special Requests   Final    BOTTLES DRAWN AEROBIC AND ANAEROBIC Blood Culture adequate volume Performed at [facility] [address], [place], [place] [address]    Culture   Final    NO GROWTH < 24 HOURS Performed at [facility] [address], [place], [place] [address]    Report Status PENDING  Incomplete  Culture, blood (routine x 2)     Status: None (Preliminary result)   Collection Time: 07/18/17 11:47 AM  Result Value Ref Range Status   Specimen Description BLOOD RIGHT ANTECUBITAL  Final   Special Requests   Final    BOTTLES DRAWN AEROBIC AND ANAEROBIC Blood Culture adequate volume Performed at  [facility] [address], [place], [place] [address]    Culture   Final    NO GROWTH < 24 HOURS Performed at [facility] [address], [place], [place] [address]    Report Status PENDING  Incomplete      Studies:  Ct Abdomen Pelvis W Contrast  Result Date: 07/18/2017 CLINICAL DATA:  84 year old female with a history of CLL and new crampy abdominal pain EXAM: CT ABDOMEN AND PELVIS WITH CONTRAST TECHNIQUE: Multidetector CT imaging of the abdomen and pelvis was performed using the standard protocol following bolus administration of intravenous contrast. CONTRAST:  9m ISOVUE-300 IOPAMIDOL (ISOVUE-300) INJECTION 61% COMPARISON:  04/30/2017, 10/21/2016, 05/08/2016 FINDINGS: Lower chest: No acute finding of the chest. Calcifications of left main, left anterior descending, circumflex, right coronary arteries. Hepatobiliary: Redemonstration of multiple low-density cystic structures of liver parenchyma, the largest in the left liver measuring 19 mm and 17 mm, most likely benign cysts. Unremarkable gallbladder Pancreas: Unremarkable pancreas Spleen: Low-density lesions at the posterior aspect of the spleen are unchanged compared to prior CT studies, with the largest measuring 9 mm, potentially related to the patient's primary diagnosis of CLL. Adrenals/Urinary Tract: Unremarkable appearance of the adrenal glands. Unremarkable left kidney with no hydronephrosis or nephrolithiasis. Right kidney with no hydronephrosis or nephrolithiasis. Low-density structure at the upper right kidney is unchanged over time, most likely a complex cyst. There are additional low-density lesions which are too small to characterize. Unremarkable course the bilateral ureters. Unremarkable urinary bladder. Stomach/Bowel: Inflammatory changes of the sigmoid colon and descending colon with no evidence of perforation. No abscess. There are extensive diverticular changes throughout this segment of colon. No  evidence of obstruction. Cecum unremarkable.  Normal appendix. Small bowel is decompressed with no transition point. No evidence of obstruction. Unremarkable stomach. Small hiatal hernia. Vascular/Lymphatic: Extensive calcifications of the abdominal aorta. The bilateral iliac and proximal femoral arteries are patent. Calcifications of the mesenteric vasculature, with no evidence of obstruction. Calcifications at the origin the bilateral renal arteries, though remain patent. Redemonstration of extensive lymphadenopathy of the abdomen including mesenteric, periaortic/periaortic nodes, and pelvic nodes. Overall, the size of lymph nodes has decreased when compared to PET CT of 10/21/2016. Reproductive: Unremarkable appearance of the uterus and adnexa. Other: Fat containing umbilical hernia. Musculoskeletal: Multilevel degenerative changes of the spine. No displaced fracture. IMPRESSION: Acute inflammatory changes of the descending colon and sigmoid colon, compatible with acute diverticulitis/neutropenic enterocolitis in this patient with leukopenia. Extensive lymph nodes within the abdomen/retroperitoneum, which are relatively decreased in size overall when compared to prior PET-CT of July 2018, likely reflecting treatment changes. Aortic atherosclerosis and associated coronary artery disease.  Aortic Atherosclerosis (ICD10-I70.0). These results were called by telephone at the time of interpretation on 07/18/2017 at 11:27 am to Dr. Zenovia Jarred , who verbally acknowledged these results. Electronically Signed   By: Corrie Mckusick D.O.   On: 07/18/2017 11:27    Assessment: 84 y.o. [place] woman with a history of chronic lymphoid leukemia definitively diagnosed 09/12/2016, currently being treated with bendamustine and prednisone, admitted with abdominal pain, condern re. Typhlitis  (1) currently day 21 cycle 1 bendamustine; continues on prednisone at 20 mg/ d             (a) currently not neutropenic  (2)  left sided colitis, no evidence of pneumatosis, perforation or abscess;              (a) day 2 pip/tazo             (b) cultures negative so far  (3) pain: low tolerance to narcotics per patient--   (a) on RTC tylenol + toradol as of 07/18/2017, with some relief  (b) added tramadol QID o4/14/2019   Plan:  Sharlie's pain is marginally better controlled; I am going to add ultram and see how she does over the next 24 h. Meanwhile it is encouraging that she had a normal BM, remains AF (on steroids), and has stable VSS.  Encouraged her to get OOBTC today.     Chauncey Cruel, MD 07/19/2017  12:09 PM Medical Oncology and Hematology [facility] [address] [place], [place] [address] Tel. [phone]    Fax. [phone]

## 2017-07-20 DIAGNOSIS — K529 Noninfective gastroenteritis and colitis, unspecified: Secondary | ICD-10-CM | POA: Diagnosis not present

## 2017-07-20 DIAGNOSIS — N179 Acute kidney failure, unspecified: Secondary | ICD-10-CM | POA: Diagnosis not present

## 2017-07-20 DIAGNOSIS — Z6823 Body mass index (BMI) 23.0-23.9, adult: Secondary | ICD-10-CM

## 2017-07-20 DIAGNOSIS — D61818 Other pancytopenia: Secondary | ICD-10-CM

## 2017-07-20 DIAGNOSIS — C9110 Chronic lymphocytic leukemia of B-cell type not having achieved remission: Secondary | ICD-10-CM | POA: Diagnosis not present

## 2017-07-20 DIAGNOSIS — I10 Essential (primary) hypertension: Secondary | ICD-10-CM | POA: Diagnosis not present

## 2017-07-20 DIAGNOSIS — C9190 Lymphoid leukemia, unspecified not having achieved remission: Secondary | ICD-10-CM | POA: Diagnosis not present

## 2017-07-20 DIAGNOSIS — E440 Moderate protein-calorie malnutrition: Secondary | ICD-10-CM

## 2017-07-20 LAB — EXPECTORATED SPUTUM ASSESSMENT W REFEX TO RESP CULTURE

## 2017-07-20 LAB — BASIC METABOLIC PANEL
ANION GAP: 9 (ref 5–15)
BUN: 21 mg/dL — AB (ref 6–20)
CO2: 25 mmol/L (ref 22–32)
Calcium: 8.9 mg/dL (ref 8.9–10.3)
Chloride: 103 mmol/L (ref 101–111)
Creatinine, Ser: 1.29 mg/dL — ABNORMAL HIGH (ref 0.44–1.00)
GFR calc Af Amer: 41 mL/min — ABNORMAL LOW (ref >60)
GFR, EST NON AFRICAN AMERICAN: 36 mL/min — AB (ref >60)
GLUCOSE: 138 mg/dL — AB (ref 65–99)
Potassium: 4.9 mmol/L (ref 3.5–5.1)
Sodium: 137 mmol/L (ref 135–145)

## 2017-07-20 LAB — EXPECTORATED SPUTUM ASSESSMENT W GRAM STAIN, RFLX TO RESP C

## 2017-07-20 MED ORDER — ENSURE ENLIVE PO LIQD
237.0000 mL | Freq: Two times a day (BID) | ORAL | Status: DC
  Administered 2017-07-20 – 2017-07-22 (×5): 237 mL via ORAL

## 2017-07-20 MED ORDER — ADULT MULTIVITAMIN W/MINERALS CH
1.0000 | ORAL_TABLET | Freq: Every day | ORAL | Status: DC
  Administered 2017-07-20 – 2017-07-23 (×4): 1 via ORAL
  Filled 2017-07-20 (×4): qty 1

## 2017-07-20 NOTE — Progress Notes (Signed)
Carla Conway   DOB:October 29, 1926   ZO#:[id number]    Assessment & Plan:  CLL Treatment was placed on hold recently due to pancytopenia She is responding to treatment with radiographic response Continue supportive care  Colitis She is responding to IV antibiotics.  Continue the same.  Acquired pancytopenia She is not neutropenic. She is not symptomatic from anemia.  Continue treatment as scheduled  Moderate protein calorie malnutrition Secondary to pain of her abdomen Continue nutritional supplement as tolerated  Discharge planning She will likely be here over the next few days receiving intravenous antibiotic treatment I will continue to follow and update her daughter  Heath Lark, MD 07/20/2017  8:13 AM   Subjective:  She continues to complain of lower abdominal pain.  Denies nausea.  She had normal bowel movement 2 days ago.  She is responding well to treatment.  Pancytopenia is improving.  She has been afebrile.  Summary of oncologic history:   CLL (chronic lymphocytic leukemia) ([place])   05/02/2013 Initial Diagnosis    She was found to have abnormal CBC from at least since 2015. According to the records from her primary care doctor that were faxed to the [facility], she had leukocytosis at least since January 2015. On 05/02/2013, total white blood cell count was 28.3, normal hemoglobin 13.4 and platelet count 239,000. She had predominantly Lymphocytosis. On 06/20/2013, a CBC show white count of 26.7, hemoglobin 12.9 and platelet count of 299,000 On admission, on 05/08/2016, white blood cell count was 38.4, hemoglobin 12.1 and platelet count of 210. Differential show predominantly lymphocytosis.      05/08/2016 Imaging    CT scan of chest 1. Negative for acute pulmonary embolism 2. Numerous pathologic nodes in the mediastinum, hila, axillae, supraclavicular regions and upper abdomen. The adenopathy is suspicious for malignancy such as lymphoma or CLL. If tissue diagnosis is  desired, many of the supraclavicular and axillary nodes should be palpable and accessible to excisional biopsy. 3. Low-attenuation liver lesions, incompletely imaged and not characterized but more likely benign. 4. Small hiatal hernia.      09/22/2016 Pathology Results    Peripheral Blood Flow Cytometry - CHRONIC LYMPHOCYTIC LEUKEMIA. - SEE COMMENT. Diagnosis Comment: There is a population of monoclonal B-cells with expression of CD5 and CD23. The phenotype is consistent with chronic lymphocytic leukemia      09/22/2016 Pathology Results    FISH confirmed trisomy 12      10/21/2016 PET scan    1. Extensive adenopathy in the neck, chest, abdomen, and pelvis as detailed above, primarily Deauville 3 and Deauville 4. Mild splenomegaly. 2. High activity in some soft tissue prominence along the anal region, possibly physiologic or due to hemorrhoidal activity or urinary incontinence, but correlation with anorectal exam recommended. 3. Focal abnormal accentuated high activity in the sigmoid colon along a region of abnormal stranding and diverticulosis, suspicious for active diverticulitis. Correlation with the patient's colon cancer screening history is recommended. If screening is not up-to-date, appropriate screening should be considered. 4. Aortic Atherosclerosis (ICD10-I70.0). Coronary atherosclerosis. 5. An exophytic high density lesion from the right kidney upper pole does not appear to be hypermetabolic and accordingly is most likely to be a benign complex cyst, but this lesion is not fully characterized on today' s exam. If the patient has hematuria or if otherwise clinically warranted, renal protocol MRI or CT examination could be utilized. 6. Some of the smaller hepatic lesions are nonspecific, but the larger hepatic lesions are photopenic and likely cysts.  10/23/2016 Procedure    Placement of single lumen port a cath via right internal jugular vein. The catheter tip lies at the  cavo-atrial junction. A power injectable port a cath was placed and is ready for immediate use      10/29/2016 - 10/30/2016 Hospital Admission    She was admitted after allergic reaction to Rituximab      10/29/2016 Adverse Reaction    She received Rituximab complicated by allergic reaction. Treatment was abandoned      11/20/2016 - 04/28/2017 Chemotherapy    The patient is started on chlorambucil and low dose prednisone      04/29/2017 - 05/01/2017 Hospital Admission    She was admitted to the hospital for management of abdominal pain and weakness.      04/30/2017 Imaging    1. Mixed appearance with regard to the extensive nodal disease in the chest, abdomen, and pelvis. Areas of improved adenopathy include the supraclavicular, axillary, and periaortic adenopathy. Areas of worsening include the subcarinal and right inguinal adenopathy. Some of the nodes are very similar to their size on prior PET-CT from 10/21/2016. 2. There is advanced sigmoid diverticulosis and just cephalad to this, there is a region of nodularity and mesenteric infiltration which has progressed compared to 10/21/2016. This may well represent an infiltrative component of malignancy rather than necessarily being due to chronic diverticulitis. No extraluminal gas is identified. No abscess noted. 3. Upper normal splenic size. There are several hypodense nonspecific splenic lesions. These were not readily appreciable on prior noncontrast images, but no associated hypermetabolic activity was previously seen. 4. Hyperdense 1.9 cm right kidney upper pole lesion is not entirely specific. This is probably a complex cyst. Attention on follow up studies suggested. 5. Transverse sclerosis in the sternal body compatible with late phase healing fracture. This was not present on the prior PET-CT from 10/21/2016. Reviewing the patient's records, she apparently received CPR after an anaphylactic reaction to chemotherapy on or around 10/29/2016,  and it is possible (although speculative) that this represents residua from sternal trauma during CPR. Correlate with any extended soreness in the chest after that episode. 6. Other imaging findings of potential clinical significance: Aortic Atherosclerosis (ICD10-I70.0). Coronary atherosclerosis. Airway thickening is present, suggesting bronchitis or reactive airways disease. Scattered hepatic cysts. Multilevel lumbar impingement due to spondylosis and degenerative disc disease.         Objective:  Vitals:   07/19/17 2140 07/20/17 0606  BP: (!) 152/71 (!) 150/70  Pulse: 71 62  Resp: 18 16  Temp: 97.9 F (36.6 C) 98.3 F (36.8 C)  SpO2: 92% 92%     Intake/Output Summary (Last 24 hours) at 07/20/2017 0813 Last data filed at 07/20/2017 0748 Gross per 24 hour  Intake 3052.9 ml  Output 140 ml  Net 2912.9 ml    GENERAL:alert, no distress and comfortable SKIN: skin color, texture, turgor are normal, no rashes or significant lesions EYES: normal, Conjunctiva are pink and non-injected, sclera clear OROPHARYNX:no exudate, no erythema and lips, buccal mucosa, and tongue normal  NECK: supple, thyroid normal size, non-tender, without nodularity LYMPH:  no palpable lymphadenopathy in the cervical, axillary or inguinal LUNGS: clear to auscultation and percussion with normal breathing effort HEART: regular rate & rhythm and no murmurs and no lower extremity edema ABDOMEN:abdomen soft, mild tenderness on palpation without rebound or guarding Musculoskeletal:no cyanosis of digits and no clubbing  NEURO: alert & oriented x 3 with fluent speech, no focal motor/sensory deficits   Labs:  Lab Results  Component Value Date   WBC 3.1 (L) 07/19/2017   HGB 10.2 (L) 07/19/2017   HCT 31.0 (L) 07/19/2017   MCV 87.6 07/19/2017   PLT 217 07/19/2017   NEUTROABS 2.0 07/18/2017    Lab Results  Component Value Date   NA 135 07/19/2017   K 3.8 07/19/2017   CL 100 (L) 07/19/2017   CO2 23  07/19/2017    Studies:  Ct Abdomen Pelvis W Contrast  Result Date: 07/18/2017 CLINICAL DATA:  84 year old female with a history of CLL and new crampy abdominal pain EXAM: CT ABDOMEN AND PELVIS WITH CONTRAST TECHNIQUE: Multidetector CT imaging of the abdomen and pelvis was performed using the standard protocol following bolus administration of intravenous contrast. CONTRAST:  77m ISOVUE-300 IOPAMIDOL (ISOVUE-300) INJECTION 61% COMPARISON:  04/30/2017, 10/21/2016, 05/08/2016 FINDINGS: Lower chest: No acute finding of the chest. Calcifications of left main, left anterior descending, circumflex, right coronary arteries. Hepatobiliary: Redemonstration of multiple low-density cystic structures of liver parenchyma, the largest in the left liver measuring 19 mm and 17 mm, most likely benign cysts. Unremarkable gallbladder Pancreas: Unremarkable pancreas Spleen: Low-density lesions at the posterior aspect of the spleen are unchanged compared to prior CT studies, with the largest measuring 9 mm, potentially related to the patient's primary diagnosis of CLL. Adrenals/Urinary Tract: Unremarkable appearance of the adrenal glands. Unremarkable left kidney with no hydronephrosis or nephrolithiasis. Right kidney with no hydronephrosis or nephrolithiasis. Low-density structure at the upper right kidney is unchanged over time, most likely a complex cyst. There are additional low-density lesions which are too small to characterize. Unremarkable course the bilateral ureters. Unremarkable urinary bladder. Stomach/Bowel: Inflammatory changes of the sigmoid colon and descending colon with no evidence of perforation. No abscess. There are extensive diverticular changes throughout this segment of colon. No evidence of obstruction. Cecum unremarkable.  Normal appendix. Small bowel is decompressed with no transition point. No evidence of obstruction. Unremarkable stomach. Small hiatal hernia. Vascular/Lymphatic: Extensive  calcifications of the abdominal aorta. The bilateral iliac and proximal femoral arteries are patent. Calcifications of the mesenteric vasculature, with no evidence of obstruction. Calcifications at the origin the bilateral renal arteries, though remain patent. Redemonstration of extensive lymphadenopathy of the abdomen including mesenteric, periaortic/periaortic nodes, and pelvic nodes. Overall, the size of lymph nodes has decreased when compared to PET CT of 10/21/2016. Reproductive: Unremarkable appearance of the uterus and adnexa. Other: Fat containing umbilical hernia. Musculoskeletal: Multilevel degenerative changes of the spine. No displaced fracture. IMPRESSION: Acute inflammatory changes of the descending colon and sigmoid colon, compatible with acute diverticulitis/neutropenic enterocolitis in this patient with leukopenia. Extensive lymph nodes within the abdomen/retroperitoneum, which are relatively decreased in size overall when compared to prior PET-CT of July 2018, likely reflecting treatment changes. Aortic atherosclerosis and associated coronary artery disease. Aortic Atherosclerosis (ICD10-I70.0). These results were called by telephone at the time of interpretation on 07/18/2017 at 11:27 am to Dr. CZenovia Jarred, who verbally acknowledged these results. Electronically Signed   By: JCorrie MckusickD.O.   On: 07/18/2017 11:27

## 2017-07-20 NOTE — Progress Notes (Signed)
PROGRESS NOTE    Carla Conway Pra  MMN:[id number] DOB: July 30, 1926 DOA: 07/18/2017 PCP: Lujean Amel, MD   Brief Narrative:  Carla Conway is a 84 y.o. female with medical history significant of coronary artery disease, hypertension, CLL presented with abdominal pain imaging concerning for diverticulitis vs enterocolitis.     Assessment & Plan:   Active Problems:   HTN (hypertension)   CLL (chronic lymphocytic leukemia) ([facility])   Neutropenia ([facility])   AKI (acute kidney injury) ([place])   Enterocolitis   Diverticulitis and Enterocolitis: abd pain , nausea and vomiting has improved but not resolved.  Resume IV zosyn, gentle hydration.  Follow  Up blood cultures. Advance diet as tolerated. Currently on soft diet.  Appreciate oncology recommendations.     CLL: Follows up with Dr Alvy Bimler .  Resume prednisone and acyclovir.   Moderate malnutrition:  Nutrition consulted and recommendations given.   CAD: No chest pain or sob.   Depression and insomnia: Resume zoloft and seroquel.    Hypertension: Slightly elevated than baseline. Probably from abd pain. Monitor.  Resume home meds metoprolol and imdur.    Acute renal failure:  Probably from dehydration and diarrhea, gently hydrate and repeat renal parameters are pending.  Continue with gentle hydration.  Stop triamterene and hydrochlorothiazide.   Cough with productive sputum with some wheezing posteriorly.  Sputum cultures.  Afebrile, normal wbc count.    Normocytic anemia: Hemoglobin stable at 10. Repeat CBC in am.    DVT prophylaxis: scd's Code Status: full code.  Family Communication: none at bedside.  Disposition Plan: pending resolution of diverticulitis    Consultants:    Oncology.   Procedures: none.   Antimicrobials: zosyn   Subjective: abd pain has improved. No nausea or vomiting.   Objective: Vitals:   07/19/17 1349 07/19/17 2140 07/20/17 0606 07/20/17 0633  BP: (!) 162/67 (!) 152/71 (!)  150/70   Pulse: 75 71 62   Resp: 14 18 16    Temp: 98.6 F (37 C) 97.9 F (36.6 C) 98.3 F (36.8 C)   TempSrc: Oral Oral Oral   SpO2: 97% 92% 92%   Weight:   66.1 kg (145 lb 11.6 oz) 65.9 kg (145 lb 4.5 oz)  Height:        Intake/Output Summary (Last 24 hours) at 07/20/2017 1047 Last data filed at 07/20/2017 0748 Gross per 24 hour  Intake 3052.9 ml  Output 140 ml  Net 2912.9 ml   Filed Weights   07/19/17 1000 07/20/17 0606 07/20/17 0633  Weight: 67.6 kg (149 lb) 66.1 kg (145 lb 11.6 oz) 65.9 kg (145 lb 4.5 oz)    Examination:  General exam: Appears calm and comfortable not in distress.  Respiratory system: Clear to auscultation. Respiratory effort normal. No wheezing or rhonchi.  Cardiovascular system: S1 & S2 heard, RRR. No JVD,  No pedal edema. Gastrointestinal system: Abdomen is soft mildly tender int he lower quadrant, bowel sounds heard.  Central nervous system: Alert and oriented. Non focal.  Extremities: Symmetric 5 x 5 power. Skin: No rashes, lesions or ulcers Psychiatry: . Mood & affect appropriate.     Data Reviewed: I have personally reviewed following labs and imaging studies  CBC: Recent Labs  Lab 07/14/17 1120 07/18/17 0945 07/19/17 0502  WBC 2.4* 4.9 3.1*  NEUTROABS 0.4* 2.0  --   HGB 10.7* 11.8* 10.2*  HCT 32.8* 36.3 31.0*  MCV 86.2 87.3 87.6  PLT 196 244 903   Basic Metabolic Panel: Recent Labs  Lab  07/14/17 1120 07/18/17 0945 07/19/17 0502  NA 134* 136 135  K 3.5 3.5 3.8  CL 100 98* 100*  CO2 26 26 23   GLUCOSE 123 103* 108*  BUN 22 18 21*  CREATININE 0.97 0.92 1.05*  CALCIUM 8.8 8.7* 8.3*  MG  --   --  1.8   GFR: Estimated Creatinine Clearance: 34 mL/min (A) (by C-G formula based on SCr of 1.05 mg/dL (H)). Liver Function Tests: Recent Labs  Lab 07/14/17 1120 07/18/17 0945 07/19/17 0502  AST 9 13* 11*  ALT 10 12* 12*  ALKPHOS 47 51 45  BILITOT 0.5 0.9 0.6  PROT 5.5* 6.2* 5.4*  ALBUMIN 2.7* 3.2* 2.9*   Recent Labs  Lab  07/18/17 0945  LIPASE 29   No results for input(s): AMMONIA in the last 168 hours. Coagulation Profile: No results for input(s): INR, PROTIME in the last 168 hours. Cardiac Enzymes: No results for input(s): CKTOTAL, CKMB, CKMBINDEX, TROPONINI in the last 168 hours. BNP (last 3 results) No results for input(s): PROBNP in the last 8760 hours. HbA1C: No results for input(s): HGBA1C in the last 72 hours. CBG: No results for input(s): GLUCAP in the last 168 hours. Lipid Profile: No results for input(s): CHOL, HDL, LDLCALC, TRIG, CHOLHDL, LDLDIRECT in the last 72 hours. Thyroid Function Tests: No results for input(s): TSH, T4TOTAL, FREET4, T3FREE, THYROIDAB in the last 72 hours. Anemia Panel: No results for input(s): VITAMINB12, FOLATE, FERRITIN, TIBC, IRON, RETICCTPCT in the last 72 hours. Sepsis Labs: Recent Labs  Lab 07/18/17 1144  LATICACIDVEN 0.53    Recent Results (from the past 240 hour(s))  Culture, blood (routine x 2)     Status: None (Preliminary result)   Collection Time: 07/18/17 11:42 AM  Result Value Ref Range Status   Specimen Description BLOOD LEFT ANTECUBITAL  Final   Special Requests   Final    BOTTLES DRAWN AEROBIC AND ANAEROBIC Blood Culture adequate volume Performed at [facility] [address] [place], [place] [address]    Culture   Final    NO GROWTH < 24 HOURS Performed at [facility] [address] [place], [place] [address]    Report Status PENDING  Incomplete  Culture, blood (routine x 2)     Status: None (Preliminary result)   Collection Time: 07/18/17 11:47 AM  Result Value Ref Range Status   Specimen Description BLOOD RIGHT ANTECUBITAL  Final   Special Requests   Final    BOTTLES DRAWN AEROBIC AND ANAEROBIC Blood Culture adequate volume Performed at [facility] [address], [place], [place] [address]    Culture   Final    NO GROWTH < 24 HOURS Performed at [facility] [address], [place], [place] [address]    Report Status PENDING  Incomplete  Culture, expectorated sputum-assessment     Status: None   Collection Time: 07/19/17  9:47 AM  Result Value Ref Range Status   Specimen Description SPUTUM  Final   Special Requests NONE  Final   Sputum evaluation   Final    Sputum specimen not acceptable for testing.  Please recollect.   NOTIFIED K.MOOREFIELD RN 07/19/2017 1238 JR Performed at [facility], [place] [address], [place], [place] [address]    Report Status 07/20/2017 FINAL  Final         Radiology Studies: Ct Abdomen Pelvis W Contrast  Result Date: 07/18/2017 CLINICAL DATA:  84 year old female with a history  of CLL and new crampy abdominal pain EXAM: CT ABDOMEN AND PELVIS WITH CONTRAST TECHNIQUE: Multidetector CT imaging of the abdomen and pelvis was performed using the standard protocol following bolus administration of intravenous contrast. CONTRAST:  55m ISOVUE-300 IOPAMIDOL (ISOVUE-300) INJECTION 61% COMPARISON:  04/30/2017, 10/21/2016, 05/08/2016 FINDINGS: Lower chest: No acute finding of the chest. Calcifications of left main, left anterior descending, circumflex, right coronary arteries. Hepatobiliary: Redemonstration of multiple low-density cystic structures of liver parenchyma, the largest in the left liver measuring 19 mm and 17 mm, most likely benign cysts. Unremarkable gallbladder Pancreas: Unremarkable pancreas Spleen: Low-density lesions at the posterior aspect of the spleen are unchanged compared to prior CT studies, with the largest measuring 9 mm, potentially related to the patient's primary diagnosis of CLL. Adrenals/Urinary Tract: Unremarkable appearance of the adrenal glands. Unremarkable left kidney with no hydronephrosis or nephrolithiasis. Right kidney with no hydronephrosis or nephrolithiasis. Low-density structure at the upper right kidney is unchanged over time, most likely a complex cyst. There are additional  low-density lesions which are too small to characterize. Unremarkable course the bilateral ureters. Unremarkable urinary bladder. Stomach/Bowel: Inflammatory changes of the sigmoid colon and descending colon with no evidence of perforation. No abscess. There are extensive diverticular changes throughout this segment of colon. No evidence of obstruction. Cecum unremarkable.  Normal appendix. Small bowel is decompressed with no transition point. No evidence of obstruction. Unremarkable stomach. Small hiatal hernia. Vascular/Lymphatic: Extensive calcifications of the abdominal aorta. The bilateral iliac and proximal femoral arteries are patent. Calcifications of the mesenteric vasculature, with no evidence of obstruction. Calcifications at the origin the bilateral renal arteries, though remain patent. Redemonstration of extensive lymphadenopathy of the abdomen including mesenteric, periaortic/periaortic nodes, and pelvic nodes. Overall, the size of lymph nodes has decreased when compared to PET CT of 10/21/2016. Reproductive: Unremarkable appearance of the uterus and adnexa. Other: Fat containing umbilical hernia. Musculoskeletal: Multilevel degenerative changes of the spine. No displaced fracture. IMPRESSION: Acute inflammatory changes of the descending colon and sigmoid colon, compatible with acute diverticulitis/neutropenic enterocolitis in this patient with leukopenia. Extensive lymph nodes within the abdomen/retroperitoneum, which are relatively decreased in size overall when compared to prior PET-CT of July 2018, likely reflecting treatment changes. Aortic atherosclerosis and associated coronary artery disease. Aortic Atherosclerosis (ICD10-I70.0). These results were called by telephone at the time of interpretation on 07/18/2017 at 11:27 am to Dr. CZenovia Jarred, who verbally acknowledged these results. Electronically Signed   By: JCorrie MckusickD.O.   On: 07/18/2017 11:27        Scheduled Meds: .  acetaminophen  500 mg Oral QID  . acyclovir  400 mg Oral Daily  . aspirin EC  81 mg Oral Daily  . enoxaparin (LOVENOX) injection  40 mg Subcutaneous Q24H  . isosorbide mononitrate  30 mg Oral Daily  . loratadine  10 mg Oral Daily  . metoprolol tartrate  25 mg Oral BID  . pantoprazole  40 mg Oral Daily  . potassium chloride  10 mEq Oral Q breakfast  . pravastatin  40 mg Oral q1800  . predniSONE  10 mg Oral QODAY  . predniSONE  20 mg Oral QODAY  . QUEtiapine  12.5 mg Oral QHS  . sertraline  50 mg Oral Daily  . traMADol  50 mg Oral Q6H  . triamterene-hydrochlorothiazide  0.5 tablet Oral Daily   Continuous Infusions: . sodium chloride 75 mL/hr at 07/20/17 0344  . piperacillin-tazobactam (ZOSYN)  IV 3.375 g (07/20/17 0518)     LOS: 0  days    Time spent: 35 minutes.     Hosie Poisson, MD [facility] Pager [phone] If 7PM-7AM, please contact night-coverage [email or web address] Password [facility] 07/20/2017, 10:47 AM
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
## 2017-07-21 ENCOUNTER — Observation Stay (HOSPITAL_COMMUNITY): Payer: Medicare Other

## 2017-07-21 DIAGNOSIS — K529 Noninfective gastroenteritis and colitis, unspecified: Secondary | ICD-10-CM | POA: Diagnosis not present

## 2017-07-21 DIAGNOSIS — E440 Moderate protein-calorie malnutrition: Secondary | ICD-10-CM | POA: Diagnosis not present

## 2017-07-21 DIAGNOSIS — J431 Panlobular emphysema: Secondary | ICD-10-CM | POA: Diagnosis not present

## 2017-07-21 DIAGNOSIS — R531 Weakness: Secondary | ICD-10-CM | POA: Diagnosis not present

## 2017-07-21 DIAGNOSIS — C9190 Lymphoid leukemia, unspecified not having achieved remission: Secondary | ICD-10-CM | POA: Diagnosis not present

## 2017-07-21 DIAGNOSIS — D61818 Other pancytopenia: Secondary | ICD-10-CM | POA: Diagnosis not present

## 2017-07-21 DIAGNOSIS — C9110 Chronic lymphocytic leukemia of B-cell type not having achieved remission: Secondary | ICD-10-CM | POA: Diagnosis not present

## 2017-07-21 LAB — BASIC METABOLIC PANEL
Anion gap: 10 (ref 5–15)
BUN: 19 mg/dL (ref 6–20)
CALCIUM: 8.3 mg/dL — AB (ref 8.9–10.3)
CO2: 25 mmol/L (ref 22–32)
CREATININE: 1.02 mg/dL — AB (ref 0.44–1.00)
Chloride: 102 mmol/L (ref 101–111)
GFR, EST AFRICAN AMERICAN: 55 mL/min — AB (ref >60)
GFR, EST NON AFRICAN AMERICAN: 47 mL/min — AB (ref >60)
Glucose, Bld: 73 mg/dL (ref 65–99)
Potassium: 3.8 mmol/L (ref 3.5–5.1)
SODIUM: 137 mmol/L (ref 135–145)

## 2017-07-21 LAB — CBC WITH DIFFERENTIAL/PLATELET
BASOS ABS: 0 10*3/uL (ref 0.0–0.1)
BASOS PCT: 0 %
EOS ABS: 0 10*3/uL (ref 0.0–0.7)
Eosinophils Relative: 1 %
HCT: 27.9 % — ABNORMAL LOW (ref 36.0–46.0)
Hemoglobin: 9.1 g/dL — ABNORMAL LOW (ref 12.0–15.0)
Lymphocytes Relative: 59 %
Lymphs Abs: 2.1 10*3/uL (ref 0.7–4.0)
MCH: 28.8 pg (ref 26.0–34.0)
MCHC: 32.6 g/dL (ref 30.0–36.0)
MCV: 88.3 fL (ref 78.0–100.0)
MONO ABS: 0.3 10*3/uL (ref 0.1–1.0)
Monocytes Relative: 8 %
NEUTROS PCT: 32 %
Neutro Abs: 1.1 10*3/uL — ABNORMAL LOW (ref 1.7–7.7)
PLATELETS: 212 10*3/uL (ref 150–400)
RBC: 3.16 MIL/uL — AB (ref 3.87–5.11)
RDW: 16.2 % — ABNORMAL HIGH (ref 11.5–15.5)
WBC: 3.5 10*3/uL — AB (ref 4.0–10.5)

## 2017-07-21 MED ORDER — IPRATROPIUM-ALBUTEROL 0.5-2.5 (3) MG/3ML IN SOLN: 3.0000 mL | Freq: Four times a day (QID) | RESPIRATORY_TRACT | Status: DC

## 2017-07-21 MED ORDER — SODIUM CHLORIDE 0.9% FLUSH
10.0000 mL | Freq: Two times a day (BID) | INTRAVENOUS | Status: DC
  Administered 2017-07-22: 10 mL

## 2017-07-21 MED ORDER — IPRATROPIUM-ALBUTEROL 0.5-2.5 (3) MG/3ML IN SOLN
3.0000 mL | Freq: Four times a day (QID) | RESPIRATORY_TRACT | Status: DC
  Administered 2017-07-21: 3 mL via RESPIRATORY_TRACT
  Filled 2017-07-21 (×2): qty 3

## 2017-07-21 MED ORDER — HYDRALAZINE HCL 20 MG/ML IJ SOLN: 5.0000 mg | INTRAMUSCULAR | Status: DC | PRN

## 2017-07-21 MED ORDER — ISOSORBIDE MONONITRATE ER 60 MG PO TB24
60.0000 mg | ORAL_TABLET | Freq: Every day | ORAL | Status: DC
  Administered 2017-07-22 – 2017-07-23 (×2): 60 mg via ORAL
  Filled 2017-07-21 (×2): qty 1

## 2017-07-21 MED ORDER — IPRATROPIUM-ALBUTEROL 0.5-2.5 (3) MG/3ML IN SOLN
3.0000 mL | Freq: Three times a day (TID) | RESPIRATORY_TRACT | Status: DC
  Administered 2017-07-22 – 2017-07-23 (×4): 3 mL via RESPIRATORY_TRACT
  Filled 2017-07-21 (×4): qty 3

## 2017-07-21 NOTE — Progress Notes (Signed)
Carla Conway   DOB:[birth year]   YO#:[id number]    Assessment & Plan:   CLL Treatment was placed on hold recently due to pancytopenia She is responding to treatment with radiographic response Continue supportive care  Colitis She is responding to IV antibiotics.  Continue the same.  Acquired pancytopenia She is not neutropenic. She is not symptomatic from anemia.  Monitor closely  Moderate protein calorie malnutrition Secondary to pain ini her abdomen Continue nutritional supplement as tolerated  Discharge planning Hopefully, she can be discharged home tomorrow She has appointment to see me in the outpatient clinic in 2 weeks I will sign off.  Please call if questions arise  Heath Lark, MD 07/21/2017  1:31 PM   Subjective:  She felt better today.  Abdominal pain has reduced substantially.  She denies nausea or vomiting.  Objective:  Vitals:   07/21/17 0827 07/21/17 0900  BP: (!) 194/86 (!) 167/60  Pulse: 71 73  Resp:    Temp:    SpO2: 96% 97%     Intake/Output Summary (Last 24 hours) at 07/21/2017 1331 Last data filed at 07/21/2017 1000 Gross per 24 hour  Intake 3710 ml  Output 200 ml  Net 3510 ml    GENERAL:alert, no distress and comfortable SKIN: skin color, texture, turgor are normal, no rashes or significant lesions ABDOMEN:abdomen soft, non-tender and normal bowel sounds Musculoskeletal:no cyanosis of digits and no clubbing  NEURO: alert & oriented x 3 with fluent speech, no focal motor/sensory deficits   Labs:  Lab Results  Component Value Date   WBC 3.5 (L) 07/21/2017   HGB 9.1 (L) 07/21/2017   HCT 27.9 (L) 07/21/2017   MCV 88.3 07/21/2017   PLT 212 07/21/2017   NEUTROABS 1.1 (L) 07/21/2017    Lab Results  Component Value Date   NA 137 07/21/2017   K 3.8 07/21/2017   CL 102 07/21/2017   CO2 25 07/21/2017    Studies:  Dg Chest 2 View  Result Date: 07/21/2017 CLINICAL DATA:  Increased cough and congestion, history of CLL EXAM: CHEST - 2  VIEW COMPARISON:  Chest x-ray of 05/13/2016 FINDINGS: Bibasilar linear atelectasis or scarring remains. No pneumonia is seen. There may be tiny pleural effusions blunting the posterior costophrenic angles. Mediastinal and hilar contours are stable and the heart is mildly enlarged. Moderate thoracic aortic atherosclerosis is present. Right-sided Port-A-Cath tip overlies the mid lower SVC. There are degenerative changes present within the right shoulder. IMPRESSION: 1. Bibasilar linear atelectasis or scarring remains. 2. Can't exclude tiny pleural effusions. 3. Port-A-Cath tip overlies the mid lower SVC. Electronically Signed   By: Ivar Drape M.D.   On: 07/21/2017 11:51

## 2017-07-21 NOTE — Evaluation (Signed)
Physical Therapy Evaluation Patient Details Name: Carla Conway MRN: [id number] DOB: 11/26/1926 Today's Date: 07/21/2017   History of Present Illness  84 yo female admitted with enterocolitis. hx of CLL, dementia, COPD, CKD, syncopal episodes  Clinical Impression  On eval, pt required Min assist for mobility. She walked ~100 feet with a RW. Pt presents with general weakness, decreased activity tolerance, and impaired gait and balance. Pt c/o lightheadedness and nausea during session. Pt does not feel she can manage at home alone at this time. Recommend ST rehab at SNF. Will follow and progress activity as tolerated.     Follow Up Recommendations SNF    Equipment Recommendations  None recommended by PT    Recommendations for Other Services       Precautions / Restrictions Precautions Precautions: Fall Restrictions Weight Bearing Restrictions: No      Mobility  Bed Mobility Overal bed mobility: Modified Independent                Transfers Overall transfer level: Needs assistance Equipment used: Rolling walker (2 wheeled) Transfers: Sit to/from Stand Sit to Stand: Min guard         General transfer comment: close guard for safety. VCs hand placement.   Ambulation/Gait Ambulation/Gait assistance: Min assist Ambulation Distance (Feet): 100 Feet Assistive device: Rolling walker (2 wheeled) Gait Pattern/deviations: Step-through pattern;Decreased stride length     General Gait Details: Assist to stabilize throughout distance. Pt c/o nausea and lightheadedness. Slow gait speed. Some fatigue noted.   Stairs            Wheelchair Mobility    Modified Rankin (Stroke Patients Only)       Balance Overall balance assessment: Mild deficits observed, not formally tested                                           Pertinent Vitals/Pain Pain Assessment: No/denies pain    Home Living Family/patient expects to be discharged to:: Private  residence Living Arrangements: Alone   Type of Home: Apartment Home Access: Level entry     Home Layout: One level Home Equipment: Walker - 4 wheels      Prior Function Level of Independence: Independent with assistive device(s)         Comments: pt/family report pt living independently, using rollator and performing all ADLs independently.       Hand Dominance        Extremity/Trunk Assessment   Upper Extremity Assessment Upper Extremity Assessment: Generalized weakness    Lower Extremity Assessment Lower Extremity Assessment: Generalized weakness    Cervical / Trunk Assessment Cervical / Trunk Assessment: Normal  Communication   Communication: HOH  Cognition Arousal/Alertness: Awake/alert Behavior During Therapy: WFL for tasks assessed/performed Overall Cognitive Status: History of cognitive impairments - at baseline                                        General Comments      Exercises     Assessment/Plan    PT Assessment Patient needs continued PT services  PT Problem List Decreased mobility;Decreased strength;Decreased balance;Decreased activity tolerance;Decreased cognition       PT Treatment Interventions DME instruction;Gait training;Functional mobility training;Therapeutic activities;Balance training;Patient/family education;Therapeutic exercise    PT Goals (Current goals can be found  in the Care Plan section)  Acute Rehab PT Goals Patient Stated Goal: to feel better PT Goal Formulation: With patient Time For Goal Achievement: 08/04/17 Potential to Achieve Goals: Good    Frequency Min 3X/week   Barriers to discharge Decreased caregiver support      Co-evaluation               AM-PAC PT "6 Clicks" Daily Activity  Outcome Measure Difficulty turning over in bed (including adjusting bedclothes, sheets and blankets)?: None Difficulty moving from lying on back to sitting on the side of the bed? : None Difficulty  sitting down on and standing up from a chair with arms (e.g., wheelchair, bedside commode, etc,.)?: A Little Help needed moving to and from a bed to chair (including a wheelchair)?: A Little Help needed walking in hospital room?: A Little Help needed climbing 3-5 steps with a railing? : A Little 6 Click Score: 20    End of Session Equipment Utilized During Treatment: Gait belt Activity Tolerance: Patient limited by fatigue(limited by nausea) Patient left: in bed;with call bell/phone within reach;with bed alarm set   PT Visit Diagnosis: Muscle weakness (generalized) (M62.81);Difficulty in walking, not elsewhere classified (R26.2)    Time: 5001-6429 PT Time Calculation (min) (ACUTE ONLY): 12 min   Charges:   PT Evaluation $PT Eval Moderate Complexity: 1 Mod     PT G Codes:          Weston Anna, MPT Pager: [phone]

## 2017-07-21 NOTE — Progress Notes (Signed)
PROGRESS NOTE    Carla Conway Pra  LKG:[id number] DOB: 09/02/1926 DOA: 07/18/2017 PCP: Lujean Amel, MD   Brief Narrative:  Carla Conway is a 84 y.o. female with medical history significant of coronary artery disease, hypertension, CLL presented with abdominal pain imaging concerning for diverticulitis vs enterocolitis.     Assessment & Plan:   Active Problems:   HTN (hypertension)   CLL (chronic lymphocytic leukemia) (HCC)   Neutropenia (HCC)   AKI (acute kidney injury) ([place])   Enterocolitis   Malnutrition of moderate degree   Diverticulitis and Enterocolitis: abd pain , nausea and vomiting has improved but not resolved.  Resume IV zosyn day 3, resume IV fluids.  So far the blood cultures have been negative. She was initially started on clears and able to advance to regular diet today.  Plan to transition to oral antibiotics in am.  Appreciate oncology recommendations.     CLL: Follows up with Dr Alvy Bimler .  Resume prednisone and acyclovir.   Moderate malnutrition:  Nutrition consulted and recommendations given.   CAD: No chest pain  Depression and insomnia: Resume zoloft and seroquel.    Hypertension: Slightly elevated than baseline. Probably from abd pain. Monitor.  Resume home meds metoprolol , increase the dose of imdur.    Acute renal failure:  Probably from dehydration and diarrhea, gently hydrate and repeat renal parameters show improvement.  Continue with gentle hydration.  Stop triamterene and hydrochlorothiazide.   Cough with productive sputum with some wheezing posteriorly/ Mild  COPD exacerbation.  Sputum cultures. CXR ordered to rule out pneumonia ,  Afebrile, normal wbc count.  Ordered duonebs on admission, bu she refused, reordered them today. If her wheezing and cough doesn't improve with duonebs , will need to increased the prednisone to 40 mg daily   Normocytic anemia: Hemoglobin stable between 9 to 10.   DVT prophylaxis: lovenox.  Code  Status: full code.  Family Communication:discussed with daughter at bedside.  Disposition Plan: pending resolution of diverticulitis    Consultants:    Oncology.   Procedures: none.   Antimicrobials: zosyn   Subjective: abd pain has improved. Reports having some dizziness associated with nausea, recommended PT evaluation.   Objective: Vitals:   07/21/17 0645 07/21/17 0825 07/21/17 0827 07/21/17 0900  BP: (!) 183/86  (!) 194/86 (!) 167/60  Pulse: 65  71 73  Resp: 18     Temp: 97.9 F (36.6 C) 98.2 F (36.8 C)    TempSrc: Oral Oral    SpO2: 95%  96% 97%  Weight:      Height:        Intake/Output Summary (Last 24 hours) at 07/21/2017 1133 Last data filed at 07/21/2017 1000 Gross per 24 hour  Intake 3710 ml  Output 200 ml  Net 3510 ml   Filed Weights   07/20/17 0606 07/20/17 0633 07/21/17 0519  Weight: 66.1 kg (145 lb 11.6 oz) 65.9 kg (145 lb 4.5 oz) 66.1 kg (145 lb 11.6 oz)    Examination:  General exam: in good spirits, comfortable.  Respiratory system: bilateral post wheezing heard, air entry fair.  Cardiovascular system: S1 & S2 heard, RRR. No JVD,  No pedal edema. Gastrointestinal system: Abdomen is soft mild to no tenderness, bowel sounds good, no abd distention.  Central nervous system: Alert and oriented. Non focal.  Extremities: Symmetric 5 x 5 power. Skin: No rashes, lesions or ulcers Psychiatry: . Mood & affect appropriate.     Data Reviewed: I have personally reviewed  following labs and imaging studies  CBC: Recent Labs  Lab 07/18/17 0945 07/19/17 0502 07/21/17 0428  WBC 4.9 3.1* 3.5*  NEUTROABS 2.0  --  1.1*  HGB 11.8* 10.2* 9.1*  HCT 36.3 31.0* 27.9*  MCV 87.3 87.6 88.3  PLT 244 217 161   Basic Metabolic Panel: Recent Labs  Lab 07/18/17 0945 07/19/17 0502 07/20/17 1352 07/21/17 0428  NA 136 135 137 137  K 3.5 3.8 4.9 3.8  CL 98* 100* 103 102  CO2 26 23 25 25   GLUCOSE 103* 108* 138* 73  BUN 18 21* 21* 19  CREATININE 0.92  1.05* 1.29* 1.02*  CALCIUM 8.7* 8.3* 8.9 8.3*  MG  --  1.8  --   --    GFR: Estimated Creatinine Clearance: 35 mL/min (A) (by C-G formula based on SCr of 1.02 mg/dL (H)). Liver Function Tests: Recent Labs  Lab 07/18/17 0945 07/19/17 0502  AST 13* 11*  ALT 12* 12*  ALKPHOS 51 45  BILITOT 0.9 0.6  PROT 6.2* 5.4*  ALBUMIN 3.2* 2.9*   Recent Labs  Lab 07/18/17 0945  LIPASE 29   No results for input(s): AMMONIA in the last 168 hours. Coagulation Profile: No results for input(s): INR, PROTIME in the last 168 hours. Cardiac Enzymes: No results for input(s): CKTOTAL, CKMB, CKMBINDEX, TROPONINI in the last 168 hours. BNP (last 3 results) No results for input(s): PROBNP in the last 8760 hours. HbA1C: No results for input(s): HGBA1C in the last 72 hours. CBG: No results for input(s): GLUCAP in the last 168 hours. Lipid Profile: No results for input(s): CHOL, HDL, LDLCALC, TRIG, CHOLHDL, LDLDIRECT in the last 72 hours. Thyroid Function Tests: No results for input(s): TSH, T4TOTAL, FREET4, T3FREE, THYROIDAB in the last 72 hours. Anemia Panel: No results for input(s): VITAMINB12, FOLATE, FERRITIN, TIBC, IRON, RETICCTPCT in the last 72 hours. Sepsis Labs: Recent Labs  Lab 07/18/17 1144  LATICACIDVEN 0.53    Recent Results (from the past 240 hour(s))  Culture, blood (routine x 2)     Status: None (Preliminary result)   Collection Time: 07/18/17 11:42 AM  Result Value Ref Range Status   Specimen Description BLOOD LEFT ANTECUBITAL  Final   Special Requests   Final    BOTTLES DRAWN AEROBIC AND ANAEROBIC Blood Culture adequate volume Performed at [facility] [address], [place], [place] [address]    Culture   Final    NO GROWTH 2 DAYS Performed at [facility] [address], [place], [place] [address]    Report Status PENDING  Incomplete  Culture, blood (routine x 2)     Status: None (Preliminary result)   Collection Time: 07/18/17 11:47 AM    Result Value Ref Range Status   Specimen Description BLOOD RIGHT ANTECUBITAL  Final   Special Requests   Final    BOTTLES DRAWN AEROBIC AND ANAEROBIC Blood Culture adequate volume Performed at [facility] [address], [place], [place] [address]    Culture   Final    NO GROWTH 2 DAYS Performed at [facility] [address], [place], [place] [address]    Report Status PENDING  Incomplete  Culture, expectorated sputum-assessment     Status: None   Collection Time: 07/19/17  9:47 AM  Result Value Ref Range Status   Specimen Description SPUTUM  Final   Special Requests NONE  Final   Sputum evaluation   Final    Sputum specimen not acceptable  for testing.  Please recollect.   NOTIFIED K.MOOREFIELD RN 07/19/2017 1238 JR Performed at [facility], [place] [address], [place], [place] [address]    Report Status 07/20/2017 FINAL  Final         Radiology Studies: No results found.      Scheduled Meds: . acetaminophen  500 mg Oral QID  . acyclovir  400 mg Oral Daily  . aspirin EC  81 mg Oral Daily  . enoxaparin (LOVENOX) injection  40 mg Subcutaneous Q24H  . feeding supplement (ENSURE ENLIVE)  237 mL Oral BID BM  . ipratropium-albuterol  3 mL Nebulization Q6H  . isosorbide mononitrate  30 mg Oral Daily  . loratadine  10 mg Oral Daily  . metoprolol tartrate  25 mg Oral BID  . multivitamin with minerals  1 tablet Oral Daily  . pantoprazole  40 mg Oral Daily  . potassium chloride  10 mEq Oral Q breakfast  . pravastatin  40 mg Oral q1800  . predniSONE  10 mg Oral QODAY  . predniSONE  20 mg Oral QODAY  . QUEtiapine  12.5 mg Oral QHS  . sertraline  50 mg Oral Daily  . traMADol  50 mg Oral Q6H   Continuous Infusions: . sodium chloride 75 mL/hr at 07/21/17 1000  . piperacillin-tazobactam (ZOSYN)  IV Stopped (07/21/17 0931)     LOS: 0 days    Time spent: 35 minutes.     Hosie Poisson, MD [facility] Pager  [phone] If 7PM-7AM, please contact night-coverage [email or web address] Password [facility] 07/21/2017, 11:33 AM

## 2017-07-21 NOTE — Progress Notes (Signed)
PT Cancellation Note  Patient Details Name: Carla Conway MRN: [id number] DOB: [birth year]   Cancelled Treatment:    Reason Eval/Treat Not Completed: Patient at procedure or test/unavailable. Will check back when schedule allows. Thanks.    Weston Anna, MPT Pager: [phone]

## 2017-07-22 DIAGNOSIS — H9192 Unspecified hearing loss, left ear: Secondary | ICD-10-CM | POA: Diagnosis present

## 2017-07-22 DIAGNOSIS — Z6822 Body mass index (BMI) 22.0-22.9, adult: Secondary | ICD-10-CM | POA: Diagnosis not present

## 2017-07-22 DIAGNOSIS — D708 Other neutropenia: Secondary | ICD-10-CM | POA: Diagnosis not present

## 2017-07-22 DIAGNOSIS — H9319 Tinnitus, unspecified ear: Secondary | ICD-10-CM | POA: Diagnosis present

## 2017-07-22 DIAGNOSIS — I129 Hypertensive chronic kidney disease with stage 1 through stage 4 chronic kidney disease, or unspecified chronic kidney disease: Secondary | ICD-10-CM | POA: Diagnosis present

## 2017-07-22 DIAGNOSIS — D61810 Antineoplastic chemotherapy induced pancytopenia: Secondary | ICD-10-CM | POA: Diagnosis present

## 2017-07-22 DIAGNOSIS — R627 Adult failure to thrive: Secondary | ICD-10-CM | POA: Diagnosis present

## 2017-07-22 DIAGNOSIS — E440 Moderate protein-calorie malnutrition: Secondary | ICD-10-CM | POA: Diagnosis present

## 2017-07-22 DIAGNOSIS — Z7982 Long term (current) use of aspirin: Secondary | ICD-10-CM | POA: Diagnosis not present

## 2017-07-22 DIAGNOSIS — K529 Noninfective gastroenteritis and colitis, unspecified: Secondary | ICD-10-CM | POA: Diagnosis not present

## 2017-07-22 DIAGNOSIS — N179 Acute kidney failure, unspecified: Secondary | ICD-10-CM | POA: Diagnosis present

## 2017-07-22 DIAGNOSIS — C9110 Chronic lymphocytic leukemia of B-cell type not having achieved remission: Secondary | ICD-10-CM | POA: Diagnosis present

## 2017-07-22 DIAGNOSIS — D61818 Other pancytopenia: Secondary | ICD-10-CM | POA: Diagnosis not present

## 2017-07-22 DIAGNOSIS — Z79899 Other long term (current) drug therapy: Secondary | ICD-10-CM | POA: Diagnosis not present

## 2017-07-22 DIAGNOSIS — K5150 Left sided colitis without complications: Secondary | ICD-10-CM | POA: Diagnosis present

## 2017-07-22 DIAGNOSIS — E785 Hyperlipidemia, unspecified: Secondary | ICD-10-CM | POA: Diagnosis present

## 2017-07-22 DIAGNOSIS — G4700 Insomnia, unspecified: Secondary | ICD-10-CM | POA: Diagnosis present

## 2017-07-22 DIAGNOSIS — Z87891 Personal history of nicotine dependence: Secondary | ICD-10-CM | POA: Diagnosis not present

## 2017-07-22 DIAGNOSIS — N183 Chronic kidney disease, stage 3 (moderate): Secondary | ICD-10-CM | POA: Diagnosis present

## 2017-07-22 DIAGNOSIS — Z955 Presence of coronary angioplasty implant and graft: Secondary | ICD-10-CM | POA: Diagnosis not present

## 2017-07-22 DIAGNOSIS — F329 Major depressive disorder, single episode, unspecified: Secondary | ICD-10-CM | POA: Diagnosis present

## 2017-07-22 DIAGNOSIS — J441 Chronic obstructive pulmonary disease with (acute) exacerbation: Secondary | ICD-10-CM | POA: Diagnosis not present

## 2017-07-22 DIAGNOSIS — C9190 Lymphoid leukemia, unspecified not having achieved remission: Secondary | ICD-10-CM | POA: Diagnosis not present

## 2017-07-22 DIAGNOSIS — D899 Disorder involving the immune mechanism, unspecified: Secondary | ICD-10-CM | POA: Diagnosis present

## 2017-07-22 DIAGNOSIS — I2510 Atherosclerotic heart disease of native coronary artery without angina pectoris: Secondary | ICD-10-CM | POA: Diagnosis present

## 2017-07-22 DIAGNOSIS — E860 Dehydration: Secondary | ICD-10-CM | POA: Diagnosis present

## 2017-07-22 DIAGNOSIS — K5792 Diverticulitis of intestine, part unspecified, without perforation or abscess without bleeding: Secondary | ICD-10-CM | POA: Diagnosis present

## 2017-07-22 MED ORDER — MONTELUKAST SODIUM 10 MG PO TABS
10.0000 mg | ORAL_TABLET | Freq: Every day | ORAL | Status: DC
  Administered 2017-07-22: 10 mg via ORAL
  Filled 2017-07-22 (×2): qty 1

## 2017-07-22 MED ORDER — SACCHAROMYCES BOULARDII 250 MG PO CAPS
250.0000 mg | ORAL_CAPSULE | Freq: Two times a day (BID) | ORAL | Status: DC
  Administered 2017-07-22 – 2017-07-23 (×3): 250 mg via ORAL
  Filled 2017-07-22 (×3): qty 1

## 2017-07-22 NOTE — Progress Notes (Signed)
PROGRESS NOTE  Carla Conway [facility] MHD:[id number] DOB: [birth year] DOA: 07/18/2017 PCP: Lujean Amel, MD  HPI/Recap of past 24 hours:  C/p ab pain, no fever, no n/v, no diarrhea today She report ear ringing on the right side, report dizziness when she turns her head  Daughter at bedside  Assessment/Plan: Active Problems:   HTN (hypertension)   COPD (chronic obstructive pulmonary disease) (HCC)   CLL (chronic lymphocytic leukemia) (HCC)   Neutropenia ([place])   Chronic kidney disease, stage III (moderate) (HCC)   AKI (acute kidney injury) ([place])   Dehydration   Generalized weakness   Pancytopenia, acquired ([place])   Enterocolitis   Malnutrition of moderate degree  Diverticulitis and Enterocolitis/neutropenic colitis?/immunosuppressed status -she presented with significant ab pain, n/v. And neutropenia -CT ab/pel on admission "Acute inflammatory changes of the descending colon and sigmoid colon, compatible with acute diverticulitis/neutropenic enterocolitis in this patient with leukopenia." -no more vomiting, nausea is improving, still has ab pain, last bm was liquid, no bm today -continue zosyn, consider transition to augmentin -home meds maxzide held since admission, she is on ivf due to poor oral intake  Acute kidney insufficiency on CKDIII Bun 21/cr 1.29 on admission Improved on ivf, bun 19/cr 1.02 today continue hold maxzide,  renal dosing meds  CLL:  Has been on multiple line of treatment, recently on bendamustine and prednisone Treatment on held due to pancytopnenia Ct on admission " Extensive lymph nodes within the abdomen/retroperitoneum, which are relatively decreased in size overall when compared to prior PET-CT of July 2018, likely reflecting treatment changes." She had neutropenia, anc 400 on 4/9, today anc 1000 oncology consulted, input appreciated. She is continue on acyclovir prophylasix, she is continued on prednisione 63m /268mon alternative days. Oncology to direct  on prednisone taper.  CAD, h/o stent in 2000 and 2005 No chest pain Continue on asa/betablocker/statin/imdur    Dizziness, positional, with tinnitus, hearing loss left ear, right ear earing hearing aids -prn meclizine, outpatient ENT follow up\   Non-severe (moderate) malnutrition in context of chronic illness Nutrition input apprecaited  Depression and insomnia: Resume zoloft and seroquel  H/o ASthma/copd: prior smoker,  Stable on home meds No wheezing  FTT: previously lives in a senior living, she is seen by PT, she will benefit SNF placement at discharge.   Code Status: full  Family Communication: patient and daughter at bedside  Disposition Plan: SNF, hopefully on 4/18   Consultants:  oncology  Procedures:  none  Antibiotics:  Zosyn from admission   Objective: BP (!) 144/68 (BP Location: Right Arm)   Pulse 66   Temp 97.7 F (36.5 C) (Oral)   Resp 14   Ht 5' 6"  (1.676 m)   Wt 66.1 kg (145 lb 11.6 oz)   SpO2 98%   BMI 23.52 kg/m   Intake/Output Summary (Last 24 hours) at 07/22/2017 1625 Last data filed at 07/22/2017 1355 Gross per 24 hour  Intake 2893.75 ml  Output -  Net 2893.75 ml   Filed Weights   07/20/17 0606 07/20/17 0633 07/21/17 0519  Weight: 66.1 kg (145 lb 11.6 oz) 65.9 kg (145 lb 4.5 oz) 66.1 kg (145 lb 11.6 oz)    Exam: Patient is examined daily including today on 07/22/2017, exams remain the same as of yesterday except that has changed    General:  NAD, frail, hard of hearing, mild memory impairment, very pleasant, appear younger than stated age. No nystagmus on exam  Cardiovascular: RRR  Respiratory: CTABL  Abdomen: hyperactive bowel  sounds, mild diffuse tender, soft, no guarding, no rebound  Musculoskeletal: No Edema  Neuro: alert, oriented   Data Reviewed: Basic Metabolic Panel: Recent Labs  Lab 07/18/17 0945 07/19/17 0502 07/20/17 1352 07/21/17 0428  NA 136 135 137 137  K 3.5 3.8 4.9 3.8  CL 98* 100* 103  102  CO2 26 23 25 25   GLUCOSE 103* 108* 138* 73  BUN 18 21* 21* 19  CREATININE 0.92 1.05* 1.29* 1.02*  CALCIUM 8.7* 8.3* 8.9 8.3*  MG  --  1.8  --   --    Liver Function Tests: Recent Labs  Lab 07/18/17 0945 07/19/17 0502  AST 13* 11*  ALT 12* 12*  ALKPHOS 51 45  BILITOT 0.9 0.6  PROT 6.2* 5.4*  ALBUMIN 3.2* 2.9*   Recent Labs  Lab 07/18/17 0945  LIPASE 29   No results for input(s): AMMONIA in the last 168 hours. CBC: Recent Labs  Lab 07/18/17 0945 07/19/17 0502 07/21/17 0428  WBC 4.9 3.1* 3.5*  NEUTROABS 2.0  --  1.1*  HGB 11.8* 10.2* 9.1*  HCT 36.3 31.0* 27.9*  MCV 87.3 87.6 88.3  PLT 244 217 212   Cardiac Enzymes:   No results for input(s): CKTOTAL, CKMB, CKMBINDEX, TROPONINI in the last 168 hours. BNP (last 3 results) No results for input(s): BNP in the last 8760 hours.  ProBNP (last 3 results) No results for input(s): PROBNP in the last 8760 hours.  CBG: No results for input(s): GLUCAP in the last 168 hours.  Recent Results (from the past 240 hour(s))  Culture, blood (routine x 2)     Status: None (Preliminary result)   Collection Time: 07/18/17 11:42 AM  Result Value Ref Range Status   Specimen Description BLOOD LEFT ANTECUBITAL  Final   Special Requests   Final    BOTTLES DRAWN AEROBIC AND ANAEROBIC Blood Culture adequate volume Performed at [place] [facility] [address] [place], [place] [address]    Culture   Final    NO GROWTH 4 DAYS Performed at [facility], [place] [address] [place], [place] [address]    Report Status PENDING  Incomplete  Culture, blood (routine x 2)     Status: None (Preliminary result)   Collection Time: 07/18/17 11:47 AM  Result Value Ref Range Status   Specimen Description BLOOD RIGHT ANTECUBITAL  Final   Special Requests   Final    BOTTLES DRAWN AEROBIC AND ANAEROBIC Blood Culture adequate volume Performed at [place] [address], [place], [place] [address]     Culture   Final    NO GROWTH 4 DAYS Performed at [facility], [place] [address] [place], [place] [address]    Report Status PENDING  Incomplete  Culture, expectorated sputum-assessment     Status: None   Collection Time: 07/19/17  9:47 AM  Result Value Ref Range Status   Specimen Description SPUTUM  Final   Special Requests NONE  Final   Sputum evaluation   Final    Sputum specimen not acceptable for testing.  Please recollect.   NOTIFIED K.MOOREFIELD RN 07/19/2017 1238 JR Performed at [facility], [place] [address], [place],  [address]    Report Status 07/20/2017 FINAL  Final     Studies: No results found.  Scheduled Meds: . acetaminophen  500 mg Oral QID  . acyclovir  400 mg Oral Daily  . aspirin EC  81 mg Oral Daily  . enoxaparin (  LOVENOX) injection  40 mg Subcutaneous Q24H  . feeding supplement (ENSURE ENLIVE)  237 mL Oral BID BM  . ipratropium-albuterol  3 mL Nebulization TID  . isosorbide mononitrate  60 mg Oral Daily  . loratadine  10 mg Oral Daily  . metoprolol tartrate  25 mg Oral BID  . multivitamin with minerals  1 tablet Oral Daily  . pantoprazole  40 mg Oral Daily  . potassium chloride  10 mEq Oral Q breakfast  . pravastatin  40 mg Oral q1800  . predniSONE  10 mg Oral QODAY  . predniSONE  20 mg Oral QODAY  . QUEtiapine  12.5 mg Oral QHS  . saccharomyces boulardii  250 mg Oral BID  . sertraline  50 mg Oral Daily  . sodium chloride flush  10-40 mL Intracatheter Q12H  . traMADol  50 mg Oral Q6H    Continuous Infusions: . sodium chloride 75 mL/hr at 07/21/17 1000  . piperacillin-tazobactam (ZOSYN)  IV 3.375 g (07/22/17 1355)     Time spent: 25 mins I have personally reviewed and interpreted on  07/22/2017 daily labs, tele strips, imagings as discussed above under date review session and assessment and plans.  I reviewed all nursing notes, pharmacy notes, consultant notes,  vitals, pertinent old records  I have discussed plan  of care as described above with RN , patient and family on 07/22/2017   Florencia Reasons MD, PhD  [facility] Pager [phone]. If 7PM-7AM, please contact night-coverage at [email or web address], password [facility] 07/22/2017, 4:25 PM  LOS: 0 days

## 2017-07-22 NOTE — Clinical Social Work Note (Signed)
Clinical Social Work Assessment  Patient Details  Name: Carla Conway MRN: [id number] Date of Birth: 11-05-1926  Date of referral:  07/22/17               Reason for consult:  Facility Placement                Permission sought to share information with:  Family Supports, Case Freight forwarder, Chartered certified accountant granted to share information::  Yes, Verbal Permission Granted  Name::     [name]  Agency::  SNF  Relationship::  daughter  Contact Information:     Housing/Transportation Living arrangements for the past 2 months:  Apartment Source of Information:  Patient Patient Interpreter Needed:  None Criminal Activity/Legal Involvement Pertinent to Current Situation/Hospitalization:  No - Comment as needed Significant Relationships:  Adult Children Lives with:  Self Do you feel safe going back to the place where you live?  Yes Need for family participation in patient care:  Yes (Comment)  Care giving concerns:  Patient reports that she lives alone. Patient states that she is able to cook, but does not eat. Patient stated that her children bring her food, but she doesn't feel it is their responsibility to take care of her.    Social Worker assessment / plan:  LCSW following for SNF placement.   Patient admitted for abdominal pain.  Patient reports that she lives alone. At baseline she reports that she uses a walker. Patient does not drive. Patient states that she has two steps to her apartment entrance, but she always has assistance when leaving or returning home. Patient reports she is independent in her ADLs. Patient states that she does not eat like she supposed to and believes that is why she keeps getting sick.   PT recommending SNF at dc. Patient is agreeable to SNF and prefers [facility].  PLAN: Patient will go to SNF at dc.   Employment status:  Retired Nurse, adult PT Recommendations:  [place] /  Referral to community resources:     Patient/Family's Response to care:  Patient is thankful for LCSW visit.   Patient/Family's Understanding of and Emotional Response to Diagnosis, Current Treatment, and Prognosis:  Patient is understanding of diagnosis and agreeable to treatment plan. Patient feels that she is a burden and repeatedly stated "I don't want to take advantage of people" when talking about receiving help from her family and going to SNF. LCSW explained to patient that her insurance should cover her SNF stay. Patient stated that she understood.   Emotional Assessment Appearance:  Appears stated age Attitude/Demeanor/Rapport:    Affect (typically observed):  Accepting, Calm Orientation:  Oriented to Self, Oriented to Place, Oriented to  Time, Oriented to Situation Alcohol / Substance use:  Not Applicable Psych involvement (Current and /or in the community):  No (Comment)  Discharge Needs  Concerns to be addressed:  No discharge needs identified Readmission within the last 30 days:  No Current discharge risk:  Lives alone Barriers to Discharge:  Continued Medical Work up, No SNF bed   Servando Snare, LCSW 07/22/2017, 11:02 AM

## 2017-07-22 NOTE — NC FL2 (Signed)
[place] LEVEL OF CARE SCREENING TOOL     IDENTIFICATION  Patient Name: Carla Conway [facility] Birthdate: Jan 05, 1927 Sex: female Admission Date (Current Location): 07/18/2017  [facility] and [place] Number:  Herbalist and Address:  [facility], [place] [address], [place], [place] [address]      Provider Number: [id number]  Attending Physician Name and Address:  Florencia Reasons, MD  Relative Name and Phone Number:       Current Level of Care: Hospital Recommended Level of Care: [place] Prior Approval Number:    Date Approved/Denied: 07/22/17 PASRR Number: [id number]  Discharge Plan: SNF    Current Diagnoses: Patient Active Problem List   Diagnosis Date Noted  . Malnutrition of moderate degree 07/20/2017  . Enterocolitis 07/18/2017  . Pancytopenia, acquired ([place]) 07/14/2017  . 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 Hypokalemia 04/29/2017  . Left arm pain 04/29/2017  . TIA (transient ischemic attack) 04/29/2017  . Abdominal pain 04/28/2017  . Elevated CK 04/01/2017  . AKI (acute kidney injury) ([place]) 03/31/2017  . Dehydration 03/31/2017  . Generalized weakness 03/31/2017  . External hemorrhoids without complication 97/05/6376  . Port-A-Cath in place 01/06/2017  . Gastritis 01/06/2017  . Poor memory 12/11/2016  . Dysuria 11/13/2016  . Anaphylaxis 10/29/2016  . Anaphylactoid reaction 10/29/2016  . Goals of care, counseling/discussion 10/28/2016  . Chronic kidney disease, stage III (moderate) ([place]) 10/17/2016  . 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 Neutropenia ([place]) 09/23/2016  . CLL (chronic lymphocytic leukemia) ([place]) 05/11/2016  . Acute on chronic diastolic heart failure ([place]) 05/10/2016  . Influenza A 05/09/2016  . Lymphadenopathy of head and neck region 05/09/2016  . Lymphadenopathy, thoracic 05/09/2016  . COPD (chronic obstructive pulmonary disease) ([place]) 05/09/2016  . Debility 05/09/2016  . Syncopal  episodes 05/09/2016  . Syncope 05/08/2016  . HTN (hypertension) 05/18/2013  . CAD in native artery 05/18/2013    Orientation RESPIRATION BLADDER Height & Weight     Self, Time, Situation, Place  Normal Continent Weight: 145 lb 11.6 oz (66.1 kg) Height:  5' 6"  (167.6 cm)  BEHAVIORAL SYMPTOMS/MOOD NEUROLOGICAL BOWEL NUTRITION STATUS        Diet(See dc summary)  AMBULATORY STATUS COMMUNICATION OF NEEDS Skin   Extensive Assist Verbally Normal                       Personal Care Assistance Level of Assistance  Bathing, Feeding, Dressing Bathing Assistance: Limited assistance Feeding assistance: Independent Dressing Assistance: Limited assistance     Functional Limitations Info  Sight, Hearing, Speech Sight Info: Adequate Hearing Info: Impaired(Right, deaf, left hearing aid) Speech Info: Adequate    SPECIAL CARE FACTORS FREQUENCY  PT (By licensed PT), OT (By licensed OT)     PT Frequency: 5x/week OT Frequency: 5x/week            Contractures Contractures Info: Not present    Additional Factors Info  Code Status, Allergies Code Status Info: Full Allergies Info: Rituximab           Current Medications (07/22/2017):  This is the current hospital active medication list Current Facility-Administered Medications  Medication Dose Route Frequency Provider Last Rate Last Dose  . 0.9 %  sodium chloride infusion   Intravenous Continuous Hosie Poisson, MD 75 mL/hr at 07/21/17 1000    . acetaminophen (TYLENOL) tablet 500 mg  500 mg Oral QID Magrinat, Virgie Dad, MD   500 mg at 07/21/17 2101  .  acyclovir (ZOVIRAX) tablet 400 mg  400 mg Oral Daily Elodia Florence., MD   400 mg at 07/21/17 0947  . aspirin EC tablet 81 mg  81 mg Oral Daily Elodia Florence., MD   81 mg at 07/21/17 0947  . enoxaparin (LOVENOX) injection 40 mg  40 mg Subcutaneous Q24H Elodia Florence., MD   40 mg at 07/21/17 1349  . feeding supplement (ENSURE ENLIVE) (ENSURE ENLIVE) liquid 237 mL   237 mL Oral BID BM Hosie Poisson, MD   237 mL at 07/21/17 1942  . guaiFENesin-dextromethorphan (ROBITUSSIN DM) 100-10 MG/5ML syrup 5 mL  5 mL Oral Q4H PRN Hosie Poisson, MD      . hydrALAZINE (APRESOLINE) injection 5 mg  5 mg Intravenous Q4H PRN Hosie Poisson, MD      . ipratropium-albuterol (DUONEB) 0.5-2.5 (3) MG/3ML nebulizer solution 3 mL  3 mL Nebulization TID Hosie Poisson, MD   3 mL at 07/22/17 0830  . isosorbide mononitrate (IMDUR) 24 hr tablet 60 mg  60 mg Oral Daily Hosie Poisson, MD      . loratadine (CLARITIN) tablet 10 mg  10 mg Oral Daily Hosie Poisson, MD   10 mg at 07/21/17 0947  . metoprolol tartrate (LOPRESSOR) tablet 25 mg  25 mg Oral BID Elodia Florence., MD   25 mg at 07/21/17 2101  . multivitamin with minerals tablet 1 tablet  1 tablet Oral Daily Hosie Poisson, MD   1 tablet at 07/21/17 0947  . oxyCODONE (Oxy IR/ROXICODONE) immediate release tablet 2.5 mg  2.5 mg Oral Q4H PRN Elodia Florence., MD   2.5 mg at 07/19/17 1958  . pantoprazole (PROTONIX) EC tablet 40 mg  40 mg Oral Daily Elodia Florence., MD   40 mg at 07/21/17 0947  . piperacillin-tazobactam (ZOSYN) IVPB 3.375 g  3.375 g Intravenous Q8H Adrian Saran, RPH 12.5 mL/hr at 07/22/17 0509 3.375 g at 07/22/17 0509  . potassium chloride (K-DUR) CR tablet 10 mEq  10 mEq Oral Q breakfast Elodia Florence., MD   10 mEq at 07/22/17 0846  . pravastatin (PRAVACHOL) tablet 40 mg  40 mg Oral q1800 Elodia Florence., MD   40 mg at 07/20/17 1801  . predniSONE (DELTASONE) tablet 10 mg  10 mg Oral Hetty Blend., MD   10 mg at 07/21/17 0947  . predniSONE (DELTASONE) tablet 20 mg  20 mg Oral Hetty Blend., MD   20 mg at 07/20/17 0956  . QUEtiapine (SEROQUEL) tablet 12.5 mg  12.5 mg Oral QHS Elodia Florence., MD   12.5 mg at 07/21/17 2101  . sertraline (ZOLOFT) tablet 50 mg  50 mg Oral Daily Elodia Florence., MD   50 mg at 07/21/17 0947  . sodium chloride flush (NS)  0.9 % injection 10-40 mL  10-40 mL Intracatheter Q12H Hosie Poisson, MD      . traMADol Veatrice Bourbon) tablet 100 mg  100 mg Oral Q6H PRN Hosie Poisson, MD   100 mg at 07/19/17 1123  . traMADol (ULTRAM) tablet 50 mg  50 mg Oral Q6H Magrinat, Virgie Dad, MD   50 mg at 07/22/17 0509     Discharge Medications: Please see discharge summary for a list of discharge medications.  Relevant Imaging Results:  Relevant Lab Results:   Additional Information ssn:[id number]  Servando Snare, LCSW

## 2017-07-23 LAB — CBC WITH DIFFERENTIAL/PLATELET
Basophils Absolute: 0 10*3/uL (ref 0.0–0.1)
Basophils Relative: 0 %
EOS ABS: 0 10*3/uL (ref 0.0–0.7)
Eosinophils Relative: 1 %
HCT: 29 % — ABNORMAL LOW (ref 36.0–46.0)
Hemoglobin: 9.6 g/dL — ABNORMAL LOW (ref 12.0–15.0)
LYMPHS ABS: 2.4 10*3/uL (ref 0.7–4.0)
Lymphocytes Relative: 66 %
MCH: 28.9 pg (ref 26.0–34.0)
MCHC: 33.1 g/dL (ref 30.0–36.0)
MCV: 87.3 fL (ref 78.0–100.0)
MONO ABS: 0.2 10*3/uL (ref 0.1–1.0)
Monocytes Relative: 6 %
Neutro Abs: 1 10*3/uL — ABNORMAL LOW (ref 1.7–7.7)
Neutrophils Relative %: 27 %
PLATELETS: 207 10*3/uL (ref 150–400)
RBC: 3.32 MIL/uL — ABNORMAL LOW (ref 3.87–5.11)
RDW: 16.3 % — AB (ref 11.5–15.5)
WBC: 3.6 10*3/uL — AB (ref 4.0–10.5)

## 2017-07-23 LAB — CULTURE, BLOOD (ROUTINE X 2)
Culture: NO GROWTH
Culture: NO GROWTH
Special Requests: ADEQUATE
Special Requests: ADEQUATE

## 2017-07-23 LAB — BASIC METABOLIC PANEL
Anion gap: 12 (ref 5–15)
BUN: 21 mg/dL — ABNORMAL HIGH (ref 6–20)
CALCIUM: 8.8 mg/dL — AB (ref 8.9–10.3)
CO2: 30 mmol/L (ref 22–32)
Chloride: 97 mmol/L — ABNORMAL LOW (ref 101–111)
Creatinine, Ser: 0.95 mg/dL (ref 0.44–1.00)
GFR calc Af Amer: 60 mL/min — ABNORMAL LOW (ref >60)
GFR, EST NON AFRICAN AMERICAN: 52 mL/min — AB (ref >60)
GLUCOSE: 89 mg/dL (ref 65–99)
POTASSIUM: 3.7 mmol/L (ref 3.5–5.1)
SODIUM: 139 mmol/L (ref 135–145)

## 2017-07-23 LAB — MAGNESIUM: MAGNESIUM: 1.8 mg/dL (ref 1.7–2.4)

## 2017-07-23 MED ORDER — ISOSORBIDE MONONITRATE ER 30 MG PO TB24: 60.0000 mg | ORAL_TABLET | Freq: Every day | ORAL | 0 refills | Status: DC

## 2017-07-23 MED ORDER — SACCHAROMYCES BOULARDII 250 MG PO CAPS: 250.0000 mg | ORAL_CAPSULE | Freq: Two times a day (BID) | ORAL | 0 refills | Status: AC

## 2017-07-23 MED ORDER — AMOXICILLIN-POT CLAVULANATE 500-125 MG PO TABS: 1.0000 | ORAL_TABLET | Freq: Two times a day (BID) | ORAL | 0 refills | Status: AC

## 2017-07-23 NOTE — Discharge Summary (Signed)
Discharge Summary  Carla Conway ZDG:[id number] DOB: 1926/10/11  PCP: Lujean Amel, MD  Admit date: 07/18/2017 Discharge date: 07/23/2017  Time spent: 34mns, more than 50% time spent on coordination of care. Patient is to d/c to SNF.  Recommendations for Outpatient Follow-up:  1. F/u with SNF MD for hospital discharge follow up, repeat cbc/bmp at follow up 2. F/u with oncology Dr gAlvy Bimler3. F/u with ENT for tinnitus, dizziness   Discharge Diagnoses:  Active Hospital Problems   Diagnosis Date Noted  . Malnutrition of moderate degree 07/20/2017  . Enterocolitis 07/18/2017  . Pancytopenia, acquired ([facility] 07/14/2017  . AKI (acute kidney injury) ([facility] 03/31/2017  . Generalized weakness 03/31/2017  . Dehydration 03/31/2017  . Chronic kidney disease, stage III (moderate) ([facility] 10/17/2016  . Neutropenia ([facility] 09/23/2016  . CLL (chronic lymphocytic leukemia) ([facility] 05/11/2016  . COPD (chronic obstructive pulmonary disease) ([facility] 05/09/2016  . HTN (hypertension) 05/18/2013    Resolved Hospital Problems  No resolved problems to display.    Discharge Condition: stable  Diet recommendation: heart healthy  Filed Weights   07/20/17 0951804/16/19 0519 07/23/17 0628  Weight: 65.9 kg (145 lb 4.5 oz) 66.1 kg (145 lb 11.6 oz) 64.3 kg (141 lb 12.1 oz)    History of present illness:  PCP: Koirala, Dibas, MD Patient coming from: home (senior living)  I have personally briefly reviewed [place] Chief Complaint: abdominal pain  HPI: MNeyda Durangois a 84y.o. female with medical history significant of coronary artery disease, hypertension, CLL presented with abdominal pain imaging concerning for diverticulitis versus neutropenic enterocolitis.  She notes her symptoms started about 1-1/2 weeks ago.  She noticed pain in her pelvic region.  Describes it as cramps and constant.  Nothing really makes the pain worse.  Tylenol might help improve the  pain a little bit.  She denies ever having any fevers, chills or chills, nausea, vomiting, diarrhea, chest pain or shortness of breath.  She saw Dr. GAlvy Bimleron 4/9 who noted that her abdominal pain has been recurrent and intermittent.  At that time it was thought to be due to her overall disease process.  She is receiving chemotherapy with Dr. GAlvy Bimler  Her most recent chemotherapy was held due to pancytopenia.    ED Course: Labs, imaging, antibiotics.  Admit for diverticulits vs neutropenic enterocolitis.    Hospital Course:  Active Problems:   HTN (hypertension)   COPD (chronic obstructive pulmonary disease) (HCC)   CLL (chronic lymphocytic leukemia) (HCC)   Neutropenia (HCC)   Chronic kidney disease, stage III (moderate) (HCC)   AKI (acute kidney injury) ([facility]   Dehydration   Generalized weakness   Pancytopenia, acquired ([facility]   Enterocolitis   Malnutrition of moderate degree  Diverticulitis and Enterocolitis/neutropenic colitis?/immunosuppressed status -she presented with significant ab pain, n/v. And neutropenia -CT ab/pel on admission "Acute inflammatory changes of the descending colon and sigmoid colon, compatible with acute diverticulitis/neutropenic enterocolitis in this patient with leukopenia." -no more vomiting, nausea is improving,  ab pain resolved, diarrhea resolved, no bm today -she is treated with  Zosyn in the hospital, she is discharged on augmentin -home meds maxzide held since admission, she received ivf in the hospital due to poor oral intake  Acute kidney insufficiency on CKDII-III Bun 21/cr 1.29 on admission Improved on ivf, bun 21/cr 0.95 at discharge. continue hold maxzide,  renal dosing meds  CLL:  Has been on multiple line of treatment, recently on bendamustine  and prednisone Treatment on held due to pancytopnenia Ct on admission " Extensive lymph nodes within the abdomen/retroperitoneum, which are relatively decreased in size overall when compared  to prior PET-CT of July 2018, likely reflecting treatment changes." She had neutropenia, anc 400 on 4/9, today anc 1000 oncology consulted, input appreciated. She is continue on acyclovir prophylasix, she is continued on prednisione 35m /232mon alternative days. Oncology to direct on prednisone taper.  CAD, h/o stent in 2000 and 2005 No chest pain Continue on asa/betablocker/statin/imdur    Dizziness, positional, with tinnitus, hearing loss left ear, right ear earing hearing aids -prn meclizine, outpatient ENT follow up   Non-severe (moderate) malnutrition in context of chronic illness Nutrition input apprecaited  Depression and insomnia: Resume zoloft and seroquel  H/o ASthma/copd: prior smoker,  Stable on home meds No wheezing, no cough, no hypoxia  FTT: previously lives in a senior living, she is seen by PT, she will benefit SNF placement at discharge.   Code Status: full  Family Communication: patient   Disposition Plan: SNF on 4/18   Consultants:  oncology  Procedures:  none  Antibiotics:  Zosyn from admission   Discharge Exam: BP (!) 155/65   Pulse 65   Temp (!) 97.3 F (36.3 C) (Oral)   Resp 16   Ht 5' 6"  (1.676 Carla)   Wt 64.3 kg (141 lb 12.1 oz)   SpO2 96%   BMI 22.88 kg/Carla   General: NAD, frail, chronically ill appearing, hard of hearing Cardiovascular: RRR Respiratory: CTABL  Discharge Instructions You were cared for by a hospitalist during your hospital stay. If you have any questions about your discharge medications or the care you received while you were in the hospital after you are discharged, you can call the unit and asked to speak with the hospitalist on call if the hospitalist that took care of you is not available. Once you are discharged, your primary care physician will handle any further medical issues. Please note that NO REFILLS for any discharge medications will be authorized once you are discharged, as it is  imperative that you return to your primary care physician (or establish a relationship with a primary care physician if you do not have one) for your aftercare needs so that they can reassess your need for medications and monitor your lab values.  Discharge Instructions    Diet general   Complete by:  As directed    Increase activity slowly   Complete by:  As directed      Allergies as of 07/23/2017      Reactions   Rituximab Other (See Comments)   "She coded"/Upset stomach and blood pressure dropped      Medication List    STOP taking these medications   triamterene-hydrochlorothiazide 37.5-25 MG tablet Commonly known as:  MAXZIDE-25     TAKE these medications   acetaminophen 500 MG tablet Commonly known as:  TYLENOL Take 650 mg by mouth daily as needed for mild pain or headache.   acyclovir 400 MG tablet Commonly known as:  ZOVIRAX Take 1 tablet (400 mg total) by mouth daily.   amoxicillin-clavulanate 500-125 MG tablet Commonly known as:  AUGMENTIN Take 1 tablet (500 mg total) by mouth 2 (two) times daily for 4 days.   aspirin 81 MG EC tablet Take 1 tablet (81 mg total) by mouth daily.   COMBIVENT RESPIMAT 20-100 MCG/ACT Aers respimat Generic drug:  Ipratropium-Albuterol Take 2 puffs by mouth every 6 (six) hours as needed  for wheezing or shortness of breath.   ENSURE PO Take 1 Can by mouth daily.   isosorbide mononitrate 30 MG 24 hr tablet Commonly known as:  IMDUR Take 2 tablets (60 mg total) by mouth daily. What changed:  how much to take   lidocaine-prilocaine cream Commonly known as:  EMLA Apply 1 application topically as needed. What changed:  reasons to take this   lovastatin 40 MG tablet Commonly known as:  MEVACOR Take 40 mg by mouth at bedtime.   metoprolol tartrate 25 MG tablet Commonly known as:  LOPRESSOR Take 1 tablet (25 mg total) by mouth 2 (two) times daily.   montelukast 10 MG tablet Commonly known as:  SINGULAIR Take 10 mg by mouth  at bedtime.   omeprazole 20 MG capsule Commonly known as:  PRILOSEC Take 1 capsule (20 mg total) by mouth daily.   ondansetron 8 MG tablet Commonly known as:  ZOFRAN Take 1 tablet (8 mg total) by mouth 2 (two) times daily as needed for refractory nausea / vomiting. Start on day 3 after chemotherapy.   potassium chloride 10 MEQ tablet Commonly known as:  K-DUR Take 10 mEq by mouth daily.   predniSONE 20 MG tablet Commonly known as:  DELTASONE TAKE 1 TABLET(20 MG) BY MOUTH DAILY WITH BREAKFAST What changed:  See the new instructions.   prochlorperazine 10 MG tablet Commonly known as:  COMPAZINE Take 1 tablet (10 mg total) by mouth every 6 (six) hours as needed (Nausea or vomiting).   QUEtiapine 25 MG tablet Commonly known as:  SEROQUEL Take 12.5 mg by mouth at bedtime.   saccharomyces boulardii 250 MG capsule Commonly known as:  FLORASTOR Take 1 capsule (250 mg total) by mouth 2 (two) times daily.   sertraline 100 MG tablet Commonly known as:  ZOLOFT Take 100 mg by mouth daily.      Allergies  Allergen Reactions  . Rituximab Other (See Comments)    "She coded"/Upset stomach and blood pressure dropped   Follow-up Information    follow up with ENT for dizziness,ear ringing Follow up.        Heath Lark, MD Follow up on 08/05/2017.   Specialty:  Hematology and Oncology Contact information: [place] [address] [phone]            The results of significant diagnostics from this hospitalization (including imaging, microbiology, ancillary and laboratory) are listed below for reference.    Significant Diagnostic Studies: Dg Chest 2 View  Result Date: 07/21/2017 CLINICAL DATA:  Increased cough and congestion, history of CLL EXAM: CHEST - 2 VIEW COMPARISON:  Chest x-ray of 05/13/2016 FINDINGS: Bibasilar linear atelectasis or scarring remains. No pneumonia is seen. There may be tiny pleural effusions blunting the posterior  costophrenic angles. Mediastinal and hilar contours are stable and the heart is mildly enlarged. Moderate thoracic aortic atherosclerosis is present. Right-sided Port-A-Cath tip overlies the mid lower SVC. There are degenerative changes present within the right shoulder. IMPRESSION: 1. Bibasilar linear atelectasis or scarring remains. 2. Can't exclude tiny pleural effusions. 3. Port-A-Cath tip overlies the mid lower SVC. Electronically Signed   By: Ivar Drape CarlaD.   On: 07/21/2017 11:51   Ct Abdomen Pelvis W Contrast  Result Date: 07/18/2017 CLINICAL DATA:  84 year old female with a history of CLL and new crampy abdominal pain EXAM: CT ABDOMEN AND PELVIS WITH CONTRAST TECHNIQUE: Multidetector CT imaging of the abdomen and pelvis was performed using the standard protocol following bolus administration of  intravenous contrast. CONTRAST:  71m ISOVUE-300 IOPAMIDOL (ISOVUE-300) INJECTION 61% COMPARISON:  04/30/2017, 10/21/2016, 05/08/2016 FINDINGS: Lower chest: No acute finding of the chest. Calcifications of left main, left anterior descending, circumflex, right coronary arteries. Hepatobiliary: Redemonstration of multiple low-density cystic structures of liver parenchyma, the largest in the left liver measuring 19 mm and 17 mm, most likely benign cysts. Unremarkable gallbladder Pancreas: Unremarkable pancreas Spleen: Low-density lesions at the posterior aspect of the spleen are unchanged compared to prior CT studies, with the largest measuring 9 mm, potentially related to the patient's primary diagnosis of CLL. Adrenals/Urinary Tract: Unremarkable appearance of the adrenal glands. Unremarkable left kidney with no hydronephrosis or nephrolithiasis. Right kidney with no hydronephrosis or nephrolithiasis. Low-density structure at the upper right kidney is unchanged over time, most likely a complex cyst. There are additional low-density lesions which are too small to characterize. Unremarkable course the bilateral  ureters. Unremarkable urinary bladder. Stomach/Bowel: Inflammatory changes of the sigmoid colon and descending colon with no evidence of perforation. No abscess. There are extensive diverticular changes throughout this segment of colon. No evidence of obstruction. Cecum unremarkable.  Normal appendix. Small bowel is decompressed with no transition point. No evidence of obstruction. Unremarkable stomach. Small hiatal hernia. Vascular/Lymphatic: Extensive calcifications of the abdominal aorta. The bilateral iliac and proximal femoral arteries are patent. Calcifications of the mesenteric vasculature, with no evidence of obstruction. Calcifications at the origin the bilateral renal arteries, though remain patent. Redemonstration of extensive lymphadenopathy of the abdomen including mesenteric, periaortic/periaortic nodes, and pelvic nodes. Overall, the size of lymph nodes has decreased when compared to PET CT of 10/21/2016. Reproductive: Unremarkable appearance of the uterus and adnexa. Other: Fat containing umbilical hernia. Musculoskeletal: Multilevel degenerative changes of the spine. No displaced fracture. IMPRESSION: Acute inflammatory changes of the descending colon and sigmoid colon, compatible with acute diverticulitis/neutropenic enterocolitis in this patient with leukopenia. Extensive lymph nodes within the abdomen/retroperitoneum, which are relatively decreased in size overall when compared to prior PET-CT of July 2018, likely reflecting treatment changes. Aortic atherosclerosis and associated coronary artery disease. Aortic Atherosclerosis (ICD10-I70.0). These results were called by telephone at the time of interpretation on 07/18/2017 at 11:27 am to Dr. CZenovia Jarred, who verbally acknowledged these results. Electronically Signed   By: JCorrie MckusickD.O.   On: 07/18/2017 11:27    Microbiology: Recent Results (from the past 240 hour(s))  Culture, blood (routine x 2)     Status: None (Preliminary  result)   Collection Time: 07/18/17 11:42 AM  Result Value Ref Range Status   Specimen Description BLOOD LEFT ANTECUBITAL  Final   Special Requests   Final    BOTTLES DRAWN AEROBIC AND ANAEROBIC Blood Culture adequate volume Performed at [facility] [place] [address]   Culture   Final    NO GROWTH 4 DAYS Performed at [facility] [address] [place] [address]   Report Status PENDING  Incomplete  Culture, blood (routine x 2)     Status: None (Preliminary result)   Collection Time: 07/18/17 11:47 AM  Result Value Ref Range Status   Specimen Description BLOOD RIGHT ANTECUBITAL  Final   Special Requests   Final    BOTTLES DRAWN AEROBIC AND ANAEROBIC Blood Culture adequate volume Performed at [address] [place]  [id number]   Culture   Final    NO GROWTH 4 DAYS Performed at [facility] [address]  [address] [place], [place] [address]    Report Status PENDING  Incomplete  Culture, expectorated sputum-assessment     Status: None   Collection Time: 07/19/17  9:47 AM  Result Value Ref Range Status   Specimen Description SPUTUM  Final   Special Requests NONE  Final   Sputum evaluation   Final    Sputum specimen not acceptable for testing.  Please recollect.   NOTIFIED K.MOOREFIELD RN 07/19/2017 1238 JR Performed at [facility], [place] [address] [place], [place] [address]    Report Status 07/20/2017 FINAL  Final     Labs: Basic Metabolic Panel: Recent Labs  Lab 07/18/17 0945 07/19/17 0502 07/20/17 1352 07/21/17 0428 07/23/17 0451  NA 136 135 137 137 139  K 3.5 3.8 4.9 3.8 3.7  CL 98* 100* 103 102 97*  CO2 26 23 25 25 30   GLUCOSE 103* 108* 138* 73 89  BUN 18 21* 21* 19 21*  CREATININE 0.92 1.05* 1.29* 1.02* 0.95  CALCIUM 8.7* 8.3* 8.9 8.3* 8.8*  MG  --  1.8  --   --  1.8   Liver Function Tests: Recent Labs  Lab 07/18/17 0945  07/19/17 0502  AST 13* 11*  ALT 12* 12*  ALKPHOS 51 45  BILITOT 0.9 0.6  PROT 6.2* 5.4*  ALBUMIN 3.2* 2.9*   Recent Labs  Lab 07/18/17 0945  LIPASE 29   No results for input(s): AMMONIA in the last 168 hours. CBC: Recent Labs  Lab 07/18/17 0945 07/19/17 0502 07/21/17 0428 07/23/17 0451  WBC 4.9 3.1* 3.5* 3.6*  NEUTROABS 2.0  --  1.1* 1.0*  HGB 11.8* 10.2* 9.1* 9.6*  HCT 36.3 31.0* 27.9* 29.0*  MCV 87.3 87.6 88.3 87.3  PLT 244 217 212 207   Cardiac Enzymes: No results for input(s): CKTOTAL, CKMB, CKMBINDEX, TROPONINI in the last 168 hours. BNP: BNP (last 3 results) No results for input(s): BNP in the last 8760 hours.  ProBNP (last 3 results) No results for input(s): PROBNP in the last 8760 hours.  CBG: No results for input(s): GLUCAP in the last 168 hours.     Signed:  Florencia Reasons MD, PhD  [facility] 07/23/2017, 11:09 AM

## 2017-07-23 NOTE — Progress Notes (Signed)
Physical Therapy Treatment Patient Details Name: Carla Conway MRN: [id number] DOB: 05-Jan-1927 Today's Date: 07/23/2017    History of Present Illness 84 yo female admitted with enterocolitis. hx of CLL, dementia, COPD, CKD, syncopal episodes    PT Comments    Pt was up sitting in recliner. She c/o not feeling well/ "nervous/shaky". RN in room-requested orthostatic vitals-see vitals section. Pt was able to walk ~60 feet on today. Decreased activity tolerance noted. Assisted pt back to bed at end of session. Will continue to follow.     Follow Up Recommendations  SNF     Equipment Recommendations  None recommended by PT    Recommendations for Other Services       Precautions / Restrictions Precautions Precautions: Fall Restrictions Weight Bearing Restrictions: No    Mobility  Bed Mobility Overal bed mobility: Modified Independent                Transfers Overall transfer level: Needs assistance Equipment used: Rolling walker (2 wheeled) Transfers: Sit to/from Stand Sit to Stand: Min guard         General transfer comment: close guard for safety. VCs hand placement.   Ambulation/Gait Ambulation/Gait assistance: Min assist Ambulation Distance (Feet): 60 Feet Assistive device: Rolling walker (2 wheeled) Gait Pattern/deviations: Step-through pattern;Decreased stride length     General Gait Details: Assist to stabilize throughout distance. Pt c/o feeling weak. Slow gait speed. Some fatigue noted. Unable to tolerate any further ambulation   Stairs             Wheelchair Mobility    Modified Rankin (Stroke Patients Only)       Balance Overall balance assessment: Needs assistance         Standing balance support: Bilateral upper extremity supported Standing balance-Leahy Scale: Poor                              Cognition Arousal/Alertness: Awake/alert Behavior During Therapy: WFL for tasks assessed/performed Overall Cognitive  Status: History of cognitive impairments - at baseline                                        Exercises      General Comments        Pertinent Vitals/Pain Pain Assessment: No/denies pain    Home Living                      Prior Function            PT Goals (current goals can now be found in the care plan section) Progress towards PT goals: Progressing toward goals    Frequency    Min 3X/week      PT Plan Current plan remains appropriate    Co-evaluation              AM-PAC PT "6 Clicks" Daily Activity  Outcome Measure  Difficulty turning over in bed (including adjusting bedclothes, sheets and blankets)?: None Difficulty moving from lying on back to sitting on the side of the bed? : None Difficulty sitting down on and standing up from a chair with arms (e.g., wheelchair, bedside commode, etc,.)?: A Little Help needed moving to and from a bed to chair (including a wheelchair)?: A Little Help needed walking in hospital room?: A Little Help needed climbing 3-5 steps with  a railing? : A Little 6 Click Score: 20    End of Session Equipment Utilized During Treatment: Gait belt Activity Tolerance: Patient limited by fatigue(pt c/o not feeling well/shaky) Patient left: in bed;with call bell/phone within reach;with bed alarm set   PT Visit Diagnosis: Muscle weakness (generalized) (M62.81);Difficulty in walking, not elsewhere classified (R26.2)     Time: 8346-2194 PT Time Calculation (min) (ACUTE ONLY): 19 min  Charges:  $Gait Training: 8-22 mins                    G Codes:         Weston Anna, MPT Pager: [phone]

## 2017-07-23 NOTE — Progress Notes (Signed)
Patient has bed at [name].  LCSW will follow for dc needs.   Carolin Coy [place]

## 2017-07-23 NOTE — Clinical Social Work Placement (Signed)
    Patient has bed at [name].  LCSW confirmed bed with facility. Room 208.  LCSW faxed dc docs to facility.   Patient will transport by car.  RN Report #: [id number]  [place] CSW [phone]   CLINICAL SOCIAL WORK PLACEMENT  NOTE  Date:  07/23/2017  Patient Details  Name: Carla Conway MRN: [id number] Date of Birth: May 03, 1926  Clinical Social Work is seeking post-discharge placement for this patient at the [place] level of care (*CSW will initial, date and re-position this form in  chart as items are completed):  Yes   Patient/family provided with [place] Work Department's list of facilities offering this level of care within the geographic area requested by the patient (or if unable, by the patient's family).  Yes   Patient/family informed of their freedom to choose among providers that offer the needed level of care, that participate in Medicare, Medicaid or managed care program needed by the patient, have an available bed and are willing to accept the patient.  Yes   Patient/family informed of [facility]'s ownership interest in [facility] and [facility], as well as of the fact that they are under no obligation to receive care at these facilities.  PASRR submitted to EDS on       PASRR number received on 07/22/17     Existing PASRR number confirmed on       FL2 transmitted to all facilities in geographic area requested by pt/family on 07/22/17     FL2 transmitted to all facilities within larger geographic area on       Patient informed that his/her managed care company has contracts with or will negotiate with certain facilities, including the following:        Yes   Patient/family informed of bed offers received.  Patient chooses bed at [facility]     Physician recommends and patient chooses bed at      Patient to be transferred to [facility] on  07/23/17.  Patient to be transferred to facility by [facility]     Patient family notified on 07/23/17 of transfer.  Name of family member notified:  [name]     PHYSICIAN Please prepare priority discharge summary, including medications     Additional Comment:    _______________________________________________ Servando Snare, LCSW 07/23/2017, 3:38 PM

## 2017-07-23 NOTE — Progress Notes (Signed)
Assessment unchanged. Pt and daughter verbalized understanding of dc instructions, the daughter through teach back. No scripts. Daughter plans to transport pt to [name]. Report called and given to [facility], nurse at facility. Pt discharged via wc to front entrance to mee daughter and awaiting vehicle to carry to [name].

## 2017-07-27 ENCOUNTER — Inpatient Hospital Stay: Payer: Medicare Other

## 2017-07-27 ENCOUNTER — Inpatient Hospital Stay (HOSPITAL_BASED_OUTPATIENT_CLINIC_OR_DEPARTMENT_OTHER): Payer: Medicare Other | Admitting: Medical

## 2017-07-27 ENCOUNTER — Telehealth: Payer: Self-pay | Admitting: *Deleted

## 2017-07-27 VITALS — BP 160/69 | HR 79 | Temp 98.0°F | Resp 17 | Ht 66.0 in | Wt 138.2 lb

## 2017-07-27 DIAGNOSIS — I2510 Atherosclerotic heart disease of native coronary artery without angina pectoris: Secondary | ICD-10-CM | POA: Diagnosis not present

## 2017-07-27 DIAGNOSIS — R1084 Generalized abdominal pain: Secondary | ICD-10-CM

## 2017-07-27 DIAGNOSIS — C9110 Chronic lymphocytic leukemia of B-cell type not having achieved remission: Secondary | ICD-10-CM

## 2017-07-27 DIAGNOSIS — K529 Noninfective gastroenteritis and colitis, unspecified: Secondary | ICD-10-CM | POA: Diagnosis not present

## 2017-07-27 DIAGNOSIS — D61818 Other pancytopenia: Secondary | ICD-10-CM | POA: Diagnosis not present

## 2017-07-27 DIAGNOSIS — Z87891 Personal history of nicotine dependence: Secondary | ICD-10-CM | POA: Diagnosis not present

## 2017-07-27 DIAGNOSIS — I10 Essential (primary) hypertension: Secondary | ICD-10-CM | POA: Diagnosis not present

## 2017-07-27 DIAGNOSIS — Z79899 Other long term (current) drug therapy: Secondary | ICD-10-CM

## 2017-07-27 DIAGNOSIS — R1033 Periumbilical pain: Secondary | ICD-10-CM

## 2017-07-27 DIAGNOSIS — E785 Hyperlipidemia, unspecified: Secondary | ICD-10-CM | POA: Diagnosis not present

## 2017-07-27 DIAGNOSIS — J45909 Unspecified asthma, uncomplicated: Secondary | ICD-10-CM | POA: Diagnosis not present

## 2017-07-27 LAB — CBC WITH DIFFERENTIAL (CANCER CENTER ONLY)
BASOS ABS: 0.1 10*3/uL (ref 0.0–0.1)
Basophils Relative: 0 %
Eosinophils Absolute: 0 10*3/uL (ref 0.0–0.5)
Eosinophils Relative: 0 %
HCT: 36.7 % (ref 34.8–46.6)
HEMOGLOBIN: 11.9 g/dL (ref 11.6–15.9)
LYMPHS PCT: 31 %
Lymphs Abs: 3.8 10*3/uL — ABNORMAL HIGH (ref 0.9–3.3)
MCH: 28.4 pg (ref 25.1–34.0)
MCHC: 32.6 g/dL (ref 31.5–36.0)
MCV: 87.2 fL (ref 79.5–101.0)
MONO ABS: 0.1 10*3/uL (ref 0.1–0.9)
Monocytes Relative: 1 %
NEUTROS ABS: 8.3 10*3/uL — AB (ref 1.5–6.5)
NEUTROS PCT: 68 %
Platelet Count: 374 10*3/uL (ref 145–400)
RBC: 4.21 MIL/uL (ref 3.70–5.45)
RDW: 17 % — AB (ref 11.2–14.5)
WBC Count: 12.3 10*3/uL — ABNORMAL HIGH (ref 3.9–10.3)

## 2017-07-27 MED ORDER — AZITHROMYCIN 500 MG PO TABS: 500.0000 mg | ORAL_TABLET | Freq: Every day | ORAL | 0 refills | Status: AC

## 2017-07-27 NOTE — Telephone Encounter (Signed)
Returned call to patient's daughter after voicemail received.  "[name] calling to ask about my mother's severe abdominal pain.  If someone would cal me to determine if I need to take her to the ED.  [phone]."     "Dr. Alvy Bimler told us to call for any problems.  She's 84 years old, called me saying she hurts really bad and needs to go somewhere to get help. Pain is to her left lower abdomen, tender to touch,  [name] giving two Tylenol every six hours is not working.  She spent last week in the hospital with bowel infection on IV antibiotics.  Discharged Friday, I wonder if the infection has come back.  I wonder if I should take her to the ED to be re-scanned.  Verbal order received and read back from Dr. Benay Spice for Symptom Management visit today or tomorrow.   Daughter reports having a meeting at [place] from 1:00 - 2:00 pm to discuss whether she'll will stay at [facility] and can get her here after this meeting today.  scheduling message sent for [facility] visit this afternoon.

## 2017-07-27 NOTE — Patient Instructions (Signed)
Implanted Port Home Guide An implanted port is a type of central line that is placed under the skin. Central lines are used to provide IV access when treatment or nutrition needs to be given through a person's veins. Implanted ports are used for long-term IV access. An implanted port may be placed because:  You need IV medicine that would be irritating to the small veins in your hands or arms.  You need long-term IV medicines, such as antibiotics.  You need IV nutrition for a long period.  You need frequent blood draws for lab tests.  You need dialysis.  Implanted ports are usually placed in the chest area, but they can also be placed in the upper arm, the abdomen, or the leg. An implanted port has two main parts:  Reservoir. The reservoir is round and will appear as a small, raised area under your skin. The reservoir is the part where a needle is inserted to give medicines or draw blood.  Catheter. The catheter is a thin, flexible tube that extends from the reservoir. The catheter is placed into a large vein. Medicine that is inserted into the reservoir goes into the catheter and then into the vein.  How will I care for my incision site? Do not get the incision site wet. Bathe or shower as directed by your health care provider. How is my port accessed? Special steps must be taken to access the port:  Before the port is accessed, a numbing cream can be placed on the skin. This helps numb the skin over the port site.  Your health care provider uses a sterile technique to access the port. ? Your health care provider must put on a mask and sterile gloves. ? The skin over your port is cleaned carefully with an antiseptic and allowed to dry. ? The port is gently pinched between sterile gloves, and a needle is inserted into the port.  Only "non-coring" port needles should be used to access the port. Once the port is accessed, a blood return should be checked. This helps ensure that the port  is in the vein and is not clogged.  If your port needs to remain accessed for a constant infusion, a clear (transparent) bandage will be placed over the needle site. The bandage and needle will need to be changed every week, or as directed by your health care provider.  Keep the bandage covering the needle clean and dry. Do not get it wet. Follow your health care provider's instructions on how to take a shower or bath while the port is accessed.  If your port does not need to stay accessed, no bandage is needed over the port.  What is flushing? Flushing helps keep the port from getting clogged. Follow your health care provider's instructions on how and when to flush the port. Ports are usually flushed with saline solution or a medicine called heparin. The need for flushing will depend on how the port is used.  If the port is used for intermittent medicines or blood draws, the port will need to be flushed: ? After medicines have been given. ? After blood has been drawn. ? As part of routine maintenance.  If a constant infusion is running, the port may not need to be flushed.  How long will my port stay implanted? The port can stay in for as long as your health care provider thinks it is needed. When it is time for the port to come out, surgery will be   done to remove it. The procedure is similar to the one performed when the port was put in. When should I seek immediate medical care? When you have an implanted port, you should seek immediate medical care if:  You notice a bad smell coming from the incision site.  You have swelling, redness, or drainage at the incision site.  You have more swelling or pain at the port site or the surrounding area.  You have a fever that is not controlled with medicine.  This information is not intended to replace advice given to you by your health care provider. Make sure you discuss any questions you have with your health care provider. Document  Released: 03/24/2005 Document Revised: 08/30/2015 Document Reviewed: 11/29/2012 Elsevier Interactive Patient Education  2017 Else[employer] Inc[employer]  

## 2017-07-28 ENCOUNTER — Ambulatory Visit: Payer: Medicare Other

## 2017-07-28 ENCOUNTER — Other Ambulatory Visit: Payer: Medicare Other

## 2017-07-29 NOTE — Progress Notes (Signed)
[facility] Progress Note   Destenie Conway [id number] February 28, 1927 84 y.o.  [name] is managed by Dr. Heath Lark  Actively treated with chemotherapy: yes  Current Therapy: [place]   Assessment: Plan:    Generalized abdominal pain - Plan: CBC with Differential (Cancer Center Only)  Enteritis - Plan: azithromycin (ZITHROMAX) 500 MG tablet   Generalized abdominal pain: A CBC was collected today.  The results showed a WBC of 12.3 with an ANC of 8.3.  The patient denies any fevers chills or sweats.  She was recently hospitalized for enteritis and was treated with IV Zosyn and discharged home on Augmentin.  She did not have a recurrence of her abdominal pain until she began Augmentin.  Plans will be to stop Augmentin and to transition to azithromycin 500 mg p.o. once daily x5 days.  Enteritis: Patient was told to stop Augmentin and to begin azithromycin 500 mg once daily for 5 days.  Please see After Visit Summary for patient specific instructions.  Future Appointments  Date Time Provider [place]  08/05/2017 11:00 AM [facility] LAB 1 [facility] None  08/05/2017 11:15 AM [facility] FLUSH NURSE 2 [facility] None  08/05/2017 11:45 AM Heath Lark, MD [facility] None  08/05/2017  1:15 PM [facility] E15 [facility] None    Orders Placed This Encounter  Procedures  . CBC with Differential (Cancer Center Only)       Subjective:   Patient ID:  Carla Conway is a 84 y.o. (DOB 1926-11-01) female.  Chief Complaint:  Chief Complaint  Patient presents with  . Abdominal Pain    HPI Carla Conway is an 84 year old female with a history of chronic lymphocytic leukemia who is managed by Dr. Heath Lark and has been treated with Verl Dicker.  She was hospitalized from 07/18/2017 through 07/24/2017 for abdominal pain and was diagnosed with enteritis.  Patient was treated with Zosyn while she was hospitalized with improvement in her pain.  Her pain did not recur until she  was discharged to rehab and was started on Augmentin.  She presents with her daughter today who plans to remove her from [name] where she was discharged for rehab.  The patient's daughter states that she does not feel that anything is being done for her there that could not be done at home.  The patient reports having left lower quadrant pain but is having no nausea, vomiting, diarrhea, constipation, fevers, chills, or sweats.  She is scheduled to see Dr. Heath Lark in follow-up on 08/05/2017.  Medications: I have reviewed the patient's current medications.  Allergies:  Allergies  Allergen Reactions  . Rituximab Other (See Comments)    "She coded"/Upset stomach and blood pressure dropped    Past Medical History:  Diagnosis Date  . Asthma   . CAD (coronary artery disease)   . Hyperlipidemia   . Hypertension     Past Surgical History:  Procedure Laterality Date  . CATARACT EXTRACTION    . CORONARY ANGIOPLASTY  10 -15 years ago   stent in [place] ,[place]  . IR FLUORO GUIDE PORT INSERTION RIGHT  10/23/2016  . IR US GUIDE VASC ACCESS RIGHT  10/23/2016    Family History  Problem Relation Age of Onset  . Cancer Father        colon    Social History   Socioeconomic History  . Marital status: Widowed    Spouse name: Not on file  . Number of children: 5  . Years of education:  Not on file  . Highest education level: Not on file  Occupational History  . Not on file  Social Needs  . Financial resource strain: Not on file  . Food insecurity:    Worry: Not on file    Inability: Not on file  . Transportation needs:    Medical: Not on file    Non-medical: Not on file  Tobacco Use  . Smoking status: Former Smoker    Types: Cigarettes    Start date: 05/18/1983  . Smokeless tobacco: Never Used  Substance and Sexual Activity  . Alcohol use: No  . Drug use: No  . Sexual activity: Not on file  Lifestyle  . Physical activity:    Days per week: Not on file    Minutes per  session: Not on file  . Stress: Not on file  Relationships  . Social connections:    Talks on phone: Not on file    Gets together: Not on file    Attends religious service: Not on file    Active member of club or organization: Not on file    Attends meetings of clubs or organizations: Not on file    Relationship status: Not on file  . Intimate partner violence:    Fear of current or ex partner: Not on file    Emotionally abused: Not on file    Physically abused: Not on file    Forced sexual activity: Not on file  Other Topics Concern  . Not on file  Social History Narrative   Lives in retirement home.      Past Medical History, Surgical history, Social history, and Family history were reviewed and updated as appropriate.   Please see review of systems for further details on the patient's review from today.   Review of Systems:  Review of Systems  Constitutional: Negative for appetite change, chills, diaphoresis and fever.  HENT: Negative for trouble swallowing.   Respiratory: Negative for chest tightness and shortness of breath.   Cardiovascular: Negative for chest pain, palpitations and leg swelling.  Gastrointestinal: Positive for abdominal pain. Negative for abdominal distention, blood in stool, constipation, diarrhea, nausea and vomiting.    Objective:   Physical Exam:  BP (!) 160/69 (BP Location: Left Arm, Patient Position: Sitting)   Pulse 79   Temp 98 F (36.7 C) (Oral)   Resp 17   Ht 5' 6"  (1.676 m)   Wt 138 lb 3.2 oz (62.7 kg)   SpO2 97%   BMI 22.31 kg/m  ECOG: 1  Physical Exam  Constitutional: No distress.  HENT:  Head: Normocephalic and atraumatic.  Mouth/Throat: No oropharyngeal exudate.  Neck: Normal range of motion. Neck supple.  Cardiovascular: Normal rate, regular rhythm and normal heart sounds. Exam reveals no gallop and no friction rub.  No murmur heard. Pulmonary/Chest: Effort normal and breath sounds normal. No respiratory distress. She has  no wheezes. She has no rales.  Abdominal: Soft. Bowel sounds are normal. She exhibits no distension, no ascites and no mass. There is no tenderness. There is no rebound and no guarding.  Lymphadenopathy:    She has no cervical adenopathy.  Neurological: Coordination normal.  Skin: Skin is warm and dry. No rash noted. She is not diaphoretic. No erythema.  Psychiatric: She has a normal mood and affect. Her behavior is normal. Judgment and thought content normal.    Lab Review:     Component Value Date/Time   NA 139 07/23/2017 0451   NA 138  02/17/2017 0810   K 3.7 07/23/2017 0451   K 3.7 02/17/2017 0810   CL 97 (L) 07/23/2017 0451   CO2 30 07/23/2017 0451   CO2 27 02/17/2017 0810   GLUCOSE 89 07/23/2017 0451   GLUCOSE 92 02/17/2017 0810   BUN 21 (H) 07/23/2017 0451   BUN 16.4 02/17/2017 0810   CREATININE 0.95 07/23/2017 0451   CREATININE 1.1 02/17/2017 0810   CALCIUM 8.8 (L) 07/23/2017 0451   CALCIUM 9.3 02/17/2017 0810   PROT 5.4 (L) 07/19/2017 0502   PROT 6.4 02/17/2017 0810   ALBUMIN 2.9 (L) 07/19/2017 0502   ALBUMIN 3.9 02/17/2017 0810   AST 11 (L) 07/19/2017 0502   AST 13 02/17/2017 0810   ALT 12 (L) 07/19/2017 0502   ALT 13 02/17/2017 0810   ALKPHOS 45 07/19/2017 0502   ALKPHOS 74 02/17/2017 0810   BILITOT 0.6 07/19/2017 0502   BILITOT 0.60 02/17/2017 0810   GFRNONAA 52 (L) 07/23/2017 0451   GFRAA 60 (L) 07/23/2017 0451       Component Value Date/Time   WBC 12.3 (H) 07/27/2017 1603   WBC 3.6 (L) 07/23/2017 0451   RBC 4.21 07/27/2017 1603   HGB 11.9 07/27/2017 1603   HGB 12.4 02/17/2017 0810   HCT 36.7 07/27/2017 1603   HCT 37.9 02/17/2017 0810   PLT 374 07/27/2017 1603   PLT 189 02/17/2017 0810   MCV 87.2 07/27/2017 1603   MCV 92.4 02/17/2017 0810   MCH 28.4 07/27/2017 1603   MCHC 32.6 07/27/2017 1603   RDW 17.0 (H) 07/27/2017 1603   RDW 15.7 (H) 02/17/2017 0810   LYMPHSABS 3.8 (H) 07/27/2017 1603   LYMPHSABS 8.5 (H) 02/17/2017 0810   MONOABS 0.1  07/27/2017 1603   MONOABS 0.0 (L) 02/17/2017 0810   EOSABS 0.0 07/27/2017 1603   EOSABS 0.1 02/17/2017 0810   BASOSABS 0.1 07/27/2017 1603   BASOSABS 0.0 02/17/2017 0810   -------------------------------  Imaging from last 24 hours (if applicable):  Radiology interpretation: Dg Chest 2 View  Result Date: 07/21/2017 CLINICAL DATA:  Increased cough and congestion, history of CLL EXAM: CHEST - 2 VIEW COMPARISON:  Chest x-ray of 05/13/2016 FINDINGS: Bibasilar linear atelectasis or scarring remains. No pneumonia is seen. There may be tiny pleural effusions blunting the posterior costophrenic angles. Mediastinal and hilar contours are stable and the heart is mildly enlarged. Moderate thoracic aortic atherosclerosis is present. Right-sided Port-A-Cath tip overlies the mid lower SVC. There are degenerative changes present within the right shoulder. IMPRESSION: 1. Bibasilar linear atelectasis or scarring remains. 2. Can't exclude tiny pleural effusions. 3. Port-A-Cath tip overlies the mid lower SVC. Electronically Signed   By: Ivar Drape M.D.   On: 07/21/2017 11:51   Ct Abdomen Pelvis W Contrast  Result Date: 07/18/2017 CLINICAL DATA:  84 year old female with a history of CLL and new crampy abdominal pain EXAM: CT ABDOMEN AND PELVIS WITH CONTRAST TECHNIQUE: Multidetector CT imaging of the abdomen and pelvis was performed using the standard protocol following bolus administration of intravenous contrast. CONTRAST:  45m ISOVUE-300 IOPAMIDOL (ISOVUE-300) INJECTION 61% COMPARISON:  04/30/2017, 10/21/2016, 05/08/2016 FINDINGS: Lower chest: No acute finding of the chest. Calcifications of left main, left anterior descending, circumflex, right coronary arteries. Hepatobiliary: Redemonstration of multiple low-density cystic structures of liver parenchyma, the largest in the left liver measuring 19 mm and 17 mm, most likely benign cysts. Unremarkable gallbladder Pancreas: Unremarkable pancreas Spleen: Low-density  lesions at the posterior aspect of the spleen are unchanged compared to prior CT studies,  with the largest measuring 9 mm, potentially related to the patient's primary diagnosis of CLL. Adrenals/Urinary Tract: Unremarkable appearance of the adrenal glands. Unremarkable left kidney with no hydronephrosis or nephrolithiasis. Right kidney with no hydronephrosis or nephrolithiasis. Low-density structure at the upper right kidney is unchanged over time, most likely a complex cyst. There are additional low-density lesions which are too small to characterize. Unremarkable course the bilateral ureters. Unremarkable urinary bladder. Stomach/Bowel: Inflammatory changes of the sigmoid colon and descending colon with no evidence of perforation. No abscess. There are extensive diverticular changes throughout this segment of colon. No evidence of obstruction. Cecum unremarkable.  Normal appendix. Small bowel is decompressed with no transition point. No evidence of obstruction. Unremarkable stomach. Small hiatal hernia. Vascular/Lymphatic: Extensive calcifications of the abdominal aorta. The bilateral iliac and proximal femoral arteries are patent. Calcifications of the mesenteric vasculature, with no evidence of obstruction. Calcifications at the origin the bilateral renal arteries, though remain patent. Redemonstration of extensive lymphadenopathy of the abdomen including mesenteric, periaortic/periaortic nodes, and pelvic nodes. Overall, the size of lymph nodes has decreased when compared to PET CT of 10/21/2016. Reproductive: Unremarkable appearance of the uterus and adnexa. Other: Fat containing umbilical hernia. Musculoskeletal: Multilevel degenerative changes of the spine. No displaced fracture. IMPRESSION: Acute inflammatory changes of the descending colon and sigmoid colon, compatible with acute diverticulitis/neutropenic enterocolitis in this patient with leukopenia. Extensive lymph nodes within the  abdomen/retroperitoneum, which are relatively decreased in size overall when compared to prior PET-CT of July 2018, likely reflecting treatment changes. Aortic atherosclerosis and associated coronary artery disease. Aortic Atherosclerosis (ICD10-I70.0). These results were called by telephone at the time of interpretation on 07/18/2017 at 11:27 am to Dr. Zenovia Jarred , who verbally acknowledged these results. Electronically Signed   By: Corrie Mckusick D.O.   On: 07/18/2017 11:27

## 2017-08-05 ENCOUNTER — Telehealth: Payer: Self-pay | Admitting: Hematology and Oncology

## 2017-08-05 ENCOUNTER — Inpatient Hospital Stay: Payer: Medicare Other

## 2017-08-05 ENCOUNTER — Inpatient Hospital Stay: Payer: Medicare Other | Attending: Hematology and Oncology

## 2017-08-05 ENCOUNTER — Inpatient Hospital Stay (HOSPITAL_BASED_OUTPATIENT_CLINIC_OR_DEPARTMENT_OTHER): Payer: Medicare Other | Admitting: Hematology and Oncology

## 2017-08-05 DIAGNOSIS — F419 Anxiety disorder, unspecified: Secondary | ICD-10-CM | POA: Insufficient documentation

## 2017-08-05 DIAGNOSIS — Z9221 Personal history of antineoplastic chemotherapy: Secondary | ICD-10-CM | POA: Insufficient documentation

## 2017-08-05 DIAGNOSIS — Z79899 Other long term (current) drug therapy: Secondary | ICD-10-CM

## 2017-08-05 DIAGNOSIS — D61818 Other pancytopenia: Secondary | ICD-10-CM | POA: Insufficient documentation

## 2017-08-05 DIAGNOSIS — Z95828 Presence of other vascular implants and grafts: Secondary | ICD-10-CM

## 2017-08-05 DIAGNOSIS — C9110 Chronic lymphocytic leukemia of B-cell type not having achieved remission: Secondary | ICD-10-CM

## 2017-08-05 DIAGNOSIS — I10 Essential (primary) hypertension: Secondary | ICD-10-CM | POA: Insufficient documentation

## 2017-08-05 DIAGNOSIS — R1033 Periumbilical pain: Secondary | ICD-10-CM

## 2017-08-05 DIAGNOSIS — N183 Chronic kidney disease, stage 3 unspecified: Secondary | ICD-10-CM

## 2017-08-05 DIAGNOSIS — R64 Cachexia: Secondary | ICD-10-CM | POA: Insufficient documentation

## 2017-08-05 DIAGNOSIS — Z87891 Personal history of nicotine dependence: Secondary | ICD-10-CM | POA: Diagnosis not present

## 2017-08-05 DIAGNOSIS — E785 Hyperlipidemia, unspecified: Secondary | ICD-10-CM | POA: Diagnosis not present

## 2017-08-05 DIAGNOSIS — R531 Weakness: Secondary | ICD-10-CM | POA: Insufficient documentation

## 2017-08-05 DIAGNOSIS — K529 Noninfective gastroenteritis and colitis, unspecified: Secondary | ICD-10-CM

## 2017-08-05 DIAGNOSIS — I2510 Atherosclerotic heart disease of native coronary artery without angina pectoris: Secondary | ICD-10-CM | POA: Diagnosis not present

## 2017-08-05 DIAGNOSIS — J45909 Unspecified asthma, uncomplicated: Secondary | ICD-10-CM | POA: Insufficient documentation

## 2017-08-05 DIAGNOSIS — C8300 Small cell B-cell lymphoma, unspecified site: Secondary | ICD-10-CM

## 2017-08-05 LAB — COMPREHENSIVE METABOLIC PANEL
ALBUMIN: 3.5 g/dL (ref 3.5–5.0)
ALK PHOS: 53 U/L (ref 40–150)
ALT: 18 U/L (ref 0–55)
ANION GAP: 8 (ref 3–11)
AST: 15 U/L (ref 5–34)
BUN: 32 mg/dL — ABNORMAL HIGH (ref 7–26)
CALCIUM: 9.2 mg/dL (ref 8.4–10.4)
CO2: 28 mmol/L (ref 22–29)
Chloride: 101 mmol/L (ref 98–109)
Creatinine, Ser: 1.22 mg/dL — ABNORMAL HIGH (ref 0.60–1.10)
GFR calc non Af Amer: 38 mL/min — ABNORMAL LOW (ref >60)
GFR, EST AFRICAN AMERICAN: 44 mL/min — AB (ref >60)
GLUCOSE: 132 mg/dL (ref 70–140)
POTASSIUM: 3.6 mmol/L (ref 3.5–5.1)
SODIUM: 137 mmol/L (ref 136–145)
Total Bilirubin: 0.4 mg/dL (ref 0.2–1.2)
Total Protein: 5.8 g/dL — ABNORMAL LOW (ref 6.4–8.3)

## 2017-08-05 LAB — CBC WITH DIFFERENTIAL/PLATELET
Basophils Absolute: 0.1 10*3/uL (ref 0.0–0.1)
Basophils Relative: 0 %
EOS ABS: 0.1 10*3/uL (ref 0.0–0.5)
EOS PCT: 1 %
HCT: 36.1 % (ref 34.8–46.6)
HEMOGLOBIN: 11.8 g/dL (ref 11.6–15.9)
Lymphocytes Relative: 54 %
Lymphs Abs: 7.3 10*3/uL — ABNORMAL HIGH (ref 0.9–3.3)
MCH: 28.6 pg (ref 25.1–34.0)
MCHC: 32.6 g/dL (ref 31.5–36.0)
MCV: 87.5 fL (ref 79.5–101.0)
MONO ABS: 0.1 10*3/uL (ref 0.1–0.9)
MONOS PCT: 1 %
Neutro Abs: 6 10*3/uL (ref 1.5–6.5)
Neutrophils Relative %: 44 %
Platelets: 237 10*3/uL (ref 145–400)
RBC: 4.13 MIL/uL (ref 3.70–5.45)
RDW: 17.8 % — AB (ref 11.2–14.5)
WBC: 13.5 10*3/uL — ABNORMAL HIGH (ref 3.9–10.3)

## 2017-08-05 LAB — URIC ACID: Uric Acid, Serum: 3.9 mg/dL (ref 2.6–7.4)

## 2017-08-05 MED ORDER — HEPARIN SOD (PORK) LOCK FLUSH 100 UNIT/ML IV SOLN
500.0000 [IU] | Freq: Once | INTRAVENOUS | Status: AC
  Administered 2017-08-05: 500 [IU]
  Filled 2017-08-05: qty 5

## 2017-08-05 MED ORDER — SODIUM CHLORIDE 0.9% FLUSH
10.0000 mL | Freq: Once | INTRAVENOUS | Status: AC
  Administered 2017-08-05: 10 mL
  Filled 2017-08-05: qty 10

## 2017-08-05 MED ORDER — SODIUM CHLORIDE 0.9 % IV SOLN
Freq: Once | INTRAVENOUS | Status: AC
  Administered 2017-08-05: 14:00:00 via INTRAVENOUS

## 2017-08-05 NOTE — Patient Instructions (Signed)
Dehydration, Adult Dehydration is a condition in which there is not enough fluid or water in the body. This happens when you lose more fluids than you take in. Important organs, such as the kidneys, brain, and heart, cannot function without a proper amount of fluids. Any loss of fluids from the body can lead to dehydration. Dehydration can range from mild to severe. This condition should be treated right away to prevent it from becoming severe. What are the causes? This condition may be caused by:  Vomiting.  Diarrhea.  Excessive sweating, such as from heat exposure or exercise.  Not drinking enough fluid, especially: ? When ill. ? While doing activity that requires a lot of energy.  Excessive urination.  Fever.  Infection.  Certain medicines, such as medicines that cause the body to lose excess fluid (diuretics).  Inability to access safe drinking water.  Reduced physical ability to get adequate water and food.  What increases the risk? This condition is more likely to develop in people:  Who have a poorly controlled long-term (chronic) illness, such as diabetes, heart disease, or kidney disease.  Who are age 60 or older.  Who are disabled.  Who live in a place with high altitude.  Who play endurance sports.  What are the signs or symptoms? Symptoms of mild dehydration may include:  Thirst.  Dry lips.  Slightly dry mouth.  Dry, warm skin.  Dizziness. Symptoms of moderate dehydration may include:  Very dry mouth.  Muscle cramps.  Dark urine. Urine may be the color of tea.  Decreased urine production.  Decreased tear production.  Heartbeat that is irregular or faster than normal (palpitations).  Headache.  Light-headedness, especially when you stand up from a sitting position.  Fainting (syncope). Symptoms of severe dehydration may include:  Changes in skin, such as: ? Cold and clammy skin. ? Blotchy (mottled) or pale skin. ? Skin that does  not quickly return to normal after being lightly pinched and released (poor skin turgor).  Changes in body fluids, such as: ? Extreme thirst. ? No tear production. ? Inability to sweat when body temperature is high, such as in hot weather. ? Very little urine production.  Changes in vital signs, such as: ? Weak pulse. ? Pulse that is more than 100 beats a minute when sitting still. ? Rapid breathing. ? Low blood pressure.  Other changes, such as: ? Sunken eyes. ? Cold hands and feet. ? Confusion. ? Lack of energy (lethargy). ? Difficulty waking up from sleep. ? Short-term weight loss. ? Unconsciousness. How is this diagnosed? This condition is diagnosed based on your symptoms and a physical exam. Blood and urine tests may be done to help confirm the diagnosis. How is this treated? Treatment for this condition depends on the severity. Mild or moderate dehydration can often be treated at home. Treatment should be started right away. Do not wait until dehydration becomes severe. Severe dehydration is an emergency and it needs to be treated in a hospital. Treatment for mild dehydration may include:  Drinking more fluids.  Replacing salts and minerals in your blood (electrolytes) that you may have lost. Treatment for moderate dehydration may include:  Drinking an oral rehydration solution (ORS). This is a drink that helps you replace fluids and electrolytes (rehydrate). It can be found at pharmacies and retail stores. Treatment for severe dehydration may include:  Receiving fluids through an IV tube.  Receiving an electrolyte solution through a feeding tube that is passed through your nose  and into your stomach (nasogastric tube, or NG tube).  Correcting any abnormalities in electrolytes.  Treating the underlying cause of dehydration. Follow these instructions at home:  If directed by your health care provider, drink an ORS: ? Make an ORS by following instructions on the  package. ? Start by drinking small amounts, about  cup (120 mL) every 5-10 minutes. ? Slowly increase how much you drink until you have taken the amount recommended by your health care provider.  Drink enough clear fluid to keep your urine clear or pale yellow. If you were told to drink an ORS, finish the ORS first, then start slowly drinking other clear fluids. Drink fluids such as: ? Water. Do not drink only water. Doing that can lead to having too little salt (sodium) in the body (hyponatremia). ? Ice chips. ? Fruit juice that you have added water to (diluted fruit juice). ? Low-calorie sports drinks.  Avoid: ? Alcohol. ? Drinks that contain a lot of sugar. These include high-calorie sports drinks, fruit juice that is not diluted, and soda. ? Caffeine. ? Foods that are greasy or contain a lot of fat or sugar.  Take over-the-counter and prescription medicines only as told by your health care provider.  Do not take sodium tablets. This can lead to having too much sodium in the body (hypernatremia).  Eat foods that contain a healthy balance of electrolytes, such as bananas, oranges, potatoes, tomatoes, and spinach.  Keep all follow-up visits as told by your health care provider. This is important. Contact a health care provider if:  You have abdominal pain that: ? Gets worse. ? Stays in one area (localizes).  You have a rash.  You have a stiff neck.  You are more irritable than usual.  You are sleepier or more difficult to wake up than usual.  You feel weak or dizzy.  You feel very thirsty.  You have urinated only a small amount of very dark urine over 6-8 hours. Get help right away if:  You have symptoms of severe dehydration.  You cannot drink fluids without vomiting.  Your symptoms get worse with treatment.  You have a fever.  You have a severe headache.  You have vomiting or diarrhea that: ? Gets worse. ? Does not go away.  You have blood or green matter  (bile) in your vomit.  You have blood in your stool. This may cause stool to look black and tarry.  You have not urinated in 6-8 hours.  You faint.  Your heart rate while sitting still is over 100 beats a minute.  You have trouble breathing. This information is not intended to replace advice given to you by your health care provider. Make sure you discuss any questions you have with your health care provider. Document Released: 03/24/2005 Document Revised: 10/19/2015 Document Reviewed: 05/18/2015 Elsevier Interactive Patient Education  [employer].

## 2017-08-05 NOTE — Telephone Encounter (Signed)
Gave avs and calendar ° °

## 2017-08-06 ENCOUNTER — Encounter: Payer: Self-pay | Admitting: Hematology and Oncology

## 2017-08-06 ENCOUNTER — Telehealth: Payer: Self-pay

## 2017-08-06 NOTE — Assessment & Plan Note (Signed)
She has intermittent acute on chronic renal failure secondary to dehydration and poor oral intake I recommend IV fluid support today and weekly if possible at home if needed She agree with the plan of care

## 2017-08-06 NOTE — Progress Notes (Signed)
[facility] OFFICE PROGRESS NOTE  Patient Care Team: Koirala, Dibas, MD as PCP - General (Family Medicine) Heath Lark, MD as Consulting Physician (Hematology and Oncology)  ASSESSMENT & PLAN:  CLL (chronic lymphocytic leukemia) ([place]) The patient has ongoing issues with recurrent abdominal pain secondary to colitis I do not believe this is related to CLL I would like to hold off chemotherapy for now I recommend prednisone daily at 10 mg and plan to see her back in 3 weeks for further assessment Recent CT imaging showed positive response to chemotherapy  Enterocolitis She has recurrent abdominal pain. Recent CT scan showed enterocolitis Her pain relapse upon discontinuation of antibiotic treatment Clinically, I do not believe she needs antibiotic treatment today I recommend conservative management with IV fluids and bowel rest if possible I will consult advanced home care to deliver IV fluids 2-3 times a week for supportive management  Generalized weakness She has generalized weakness and debility since recurrent hospitalization She is back at home She is getting home physical therapy We will complement home care with advanced home care nursing visit at least once a week to check on her and to deliver supportive care at home if possible The patient and her daughter agree with the plan of care  Chronic kidney disease, stage III (moderate) ([place]) She has intermittent acute on chronic renal failure secondary to dehydration and poor oral intake I recommend IV fluid support today and weekly if possible at home if needed She agree with the plan of care   Orders Placed This Encounter  Procedures  . Ambulatory referral to Home Health    Referral Priority:   Routine    Referral Type:   Home Health Care    Referral Reason:   Specialty Services Required    Requested Specialty:   [place]    Number of Visits Requested:   1    INTERVAL HISTORY: Please see  below for problem oriented charting. She returns with her daughter for further follow-up. The patient has poor hearing She completed another course of antibiotics a week ago She has intermittent periumbilical abdominal pain, similar to her previous pain while she was hospitalized She denies nausea, diarrhea, changes in bowel habits or fever Her appetite is stable with prednisone therapy Her daughter has expressed concern and she will be leaving away for vacation and felt that her mother is weak She was just recently discharged from skilled nursing facility  [place]:   CLL (chronic lymphocytic leukemia) ([place])   05/02/2013 Initial Diagnosis    She was found to have abnormal CBC from at least since 2015. According to the records from her primary care doctor that were faxed to the [facility], she had leukocytosis at least since January 2015. On 05/02/2013, total white blood cell count was 28.3, normal hemoglobin 13.4 and platelet count 239,000. She had predominantly Lymphocytosis. On 06/20/2013, a CBC show white count of 26.7, hemoglobin 12.9 and platelet count of 299,000 On admission, on 05/08/2016, white blood cell count was 38.4, hemoglobin 12.1 and platelet count of 210. Differential show predominantly lymphocytosis.      05/08/2016 Imaging    CT scan of chest 1. Negative for acute pulmonary embolism 2. Numerous pathologic nodes in the mediastinum, hila, axillae, supraclavicular regions and upper abdomen. The adenopathy is suspicious for malignancy such as lymphoma or CLL. If tissue diagnosis is desired, many of the supraclavicular and axillary nodes should be palpable and accessible to excisional biopsy. 3.  Low-attenuation liver lesions, incompletely imaged and not characterized but more likely benign. 4. Small hiatal hernia.      09/22/2016 Pathology Results    Peripheral Blood Flow Cytometry - CHRONIC LYMPHOCYTIC LEUKEMIA. - SEE COMMENT. Diagnosis  Comment: There is a population of monoclonal B-cells with expression of CD5 and CD23. The phenotype is consistent with chronic lymphocytic leukemia      09/22/2016 Pathology Results    FISH confirmed trisomy 12      10/21/2016 PET scan    1. Extensive adenopathy in the neck, chest, abdomen, and pelvis as detailed above, primarily Deauville 3 and Deauville 4. Mild splenomegaly. 2. High activity in some soft tissue prominence along the anal region, possibly physiologic or due to hemorrhoidal activity or urinary incontinence, but correlation with anorectal exam recommended. 3. Focal abnormal accentuated high activity in the sigmoid colon along a region of abnormal stranding and diverticulosis, suspicious for active diverticulitis. Correlation with the patient's colon cancer screening history is recommended. If screening is not up-to-date, appropriate screening should be considered. 4. Aortic Atherosclerosis (ICD10-I70.0). Coronary atherosclerosis. 5. An exophytic high density lesion from the right kidney upper pole does not appear to be hypermetabolic and accordingly is most likely to be a benign complex cyst, but this lesion is not fully characterized on today' s exam. If the patient has hematuria or if otherwise clinically warranted, renal protocol MRI or CT examination could be utilized. 6. Some of the smaller hepatic lesions are nonspecific, but the larger hepatic lesions are photopenic and likely cysts.      10/23/2016 Procedure    Placement of single lumen port a cath via right internal jugular vein. The catheter tip lies at the cavo-atrial junction. A power injectable port a cath was placed and is ready for immediate use      10/29/2016 - 10/30/2016 Hospital Admission    She was admitted after allergic reaction to Rituximab      10/29/2016 Adverse Reaction    She received Rituximab complicated by allergic reaction. Treatment was abandoned      11/20/2016 - 04/28/2017 Chemotherapy    The  patient is started on chlorambucil and low dose prednisone      04/29/2017 - 05/01/2017 Hospital Admission    She was admitted to the hospital for management of abdominal pain and weakness.      04/30/2017 Imaging    1. Mixed appearance with regard to the extensive nodal disease in the chest, abdomen, and pelvis. Areas of improved adenopathy include the supraclavicular, axillary, and periaortic adenopathy. Areas of worsening include the subcarinal and right inguinal adenopathy. Some of the nodes are very similar to their size on prior PET-CT from 10/21/2016. 2. There is advanced sigmoid diverticulosis and just cephalad to this, there is a region of nodularity and mesenteric infiltration which has progressed compared to 10/21/2016. This may well represent an infiltrative component of malignancy rather than necessarily being due to chronic diverticulitis. No extraluminal gas is identified. No abscess noted. 3. Upper normal splenic size. There are several hypodense nonspecific splenic lesions. These were not readily appreciable on prior noncontrast images, but no associated hypermetabolic activity was previously seen. 4. Hyperdense 1.9 cm right kidney upper pole lesion is not entirely specific. This is probably a complex cyst. Attention on follow up studies suggested. 5. Transverse sclerosis in the sternal body compatible with late phase healing fracture. This was not present on the prior PET-CT from 10/21/2016. Reviewing the patient's records, she apparently received CPR after an anaphylactic reaction  to chemotherapy on or around 10/29/2016, and it is possible (although speculative) that this represents residua from sternal trauma during CPR. Correlate with any extended soreness in the chest after that episode. 6. Other imaging findings of potential clinical significance: Aortic Atherosclerosis (ICD10-I70.0). Coronary atherosclerosis. Airway thickening is present, suggesting bronchitis or reactive  airways disease. Scattered hepatic cysts. Multilevel lumbar impingement due to spondylosis and degenerative disc disease.       07/18/2017 - 07/23/2017 Hospital Admission    She was admitted to the hospital for enterocolitis      07/18/2017 Imaging    Ct abdomen and pelvis Acute inflammatory changes of the descending colon and sigmoid colon, compatible with acute diverticulitis/neutropenic enterocolitis in this patient with leukopenia.  Extensive lymph nodes within the abdomen/retroperitoneum, which are relatively decreased in size overall when compared to prior PET-CT of July 2018, likely reflecting treatment changes.  Aortic atherosclerosis and associated coronary artery disease. Aortic Atherosclerosis (ICD10-I70.0).       REVIEW OF SYSTEMS:   Constitutional: Denies fevers, chills or abnormal weight loss Eyes: Denies blurriness of vision Ears, nose, mouth, throat, and face: Denies mucositis or sore throat Respiratory: Denies cough, dyspnea or wheezes Cardiovascular: Denies palpitation, chest discomfort or lower extremity swelling Skin: Denies abnormal skin rashes Lymphatics: Denies new lymphadenopathy or easy bruising Neurological:Denies numbness, tingling or new weaknesses Behavioral/Psych: Mood is stable, no new changes  All other systems were reviewed with the patient and are negative.  I have reviewed the past medical history, past surgical history, social history and family history with the patient and they are unchanged from previous note.  ALLERGIES:  is allergic to rituximab.  MEDICATIONS:  Current Outpatient Medications  Medication Sig Dispense Refill  . acetaminophen (TYLENOL) 500 MG tablet Take 650 mg by mouth daily as needed for mild pain or headache.     Marland Kitchen acyclovir (ZOVIRAX) 400 MG tablet Take 1 tablet (400 mg total) by mouth daily. 30 tablet 3  . aspirin EC 81 MG EC tablet Take 1 tablet (81 mg total) by mouth daily. 30 tablet 2  . COMBIVENT RESPIMAT 20-100  MCG/ACT AERS respimat Take 2 puffs by mouth every 6 (six) hours as needed for wheezing or shortness of breath.     . isosorbide mononitrate (IMDUR) 30 MG 24 hr tablet Take 2 tablets (60 mg total) by mouth daily. 60 tablet 0  . lidocaine-prilocaine (EMLA) cream Apply 1 application topically as needed. (Patient taking differently: Apply 1 application topically as needed (For port-a-cath.). ) 30 g 6  . lovastatin (MEVACOR) 40 MG tablet Take 40 mg by mouth at bedtime.     . metoprolol tartrate (LOPRESSOR) 25 MG tablet Take 1 tablet (25 mg total) by mouth 2 (two) times daily. 60 tablet 2  . montelukast (SINGULAIR) 10 MG tablet Take 10 mg by mouth at bedtime.  4  . Nutritional Supplements (ENSURE PO) Take 1 Can by mouth daily.    Marland Kitchen omeprazole (PRILOSEC) 20 MG capsule Take 1 capsule (20 mg total) by mouth daily. 30 capsule 11  . ondansetron (ZOFRAN) 8 MG tablet Take 1 tablet (8 mg total) by mouth 2 (two) times daily as needed for refractory nausea / vomiting. Start on day 3 after chemotherapy. 30 tablet 1  . potassium chloride (K-DUR) 10 MEQ tablet Take 10 mEq by mouth daily.    . predniSONE (DELTASONE) 20 MG tablet TAKE 1 TABLET(20 MG) BY MOUTH DAILY WITH BREAKFAST (Patient taking differently: TAKE 20 MG BY MOUTH EVERY OTHER DAY WITH  BREAKFAST ALTERNATING WITH 10 MG EVERY OTHER DAY) 30 tablet 0  . prochlorperazine (COMPAZINE) 10 MG tablet Take 1 tablet (10 mg total) by mouth every 6 (six) hours as needed (Nausea or vomiting). 30 tablet 1  . QUEtiapine (SEROQUEL) 25 MG tablet Take 12.5 mg by mouth at bedtime.     . saccharomyces boulardii (FLORASTOR) 250 MG capsule Take 1 capsule (250 mg total) by mouth 2 (two) times daily. 60 capsule 0  . sertraline (ZOLOFT) 100 MG tablet Take 100 mg by mouth daily.  3   No current facility-administered medications for this visit.     PHYSICAL EXAMINATION: ECOG PERFORMANCE STATUS: 2 - Symptomatic, <50% confined to bed  Vitals:   08/05/17 1205  BP: (!) 141/62   Pulse: 71  Resp: 20  Temp: (!) 97.4 F (36.3 C)  SpO2: 97%   Filed Weights   08/05/17 1205  Weight: 139 lb 12.8 oz (63.4 kg)    GENERAL:alert, no distress and comfortable.  She looks debilitated SKIN: skin color, texture, turgor are normal, no rashes or significant lesions EYES: normal, Conjunctiva are pink and non-injected, sclera clear OROPHARYNX:no exudate, no erythema and lips, buccal mucosa, and tongue normal  NECK: supple, thyroid normal size, non-tender, without nodularity LYMPH:  no palpable lymphadenopathy in the cervical, axillary or inguinal LUNGS: clear to auscultation and percussion with normal breathing effort HEART: regular rate & rhythm and no murmurs and no lower extremity edema ABDOMEN:abdomen soft, mild tenderness on palpation around her periumbilical region without rebound or guarding.  Active bowel sounds are noted Musculoskeletal:no cyanosis of digits and no clubbing  NEURO: alert & oriented x 3 with fluent speech, no focal motor/sensory deficits  LABORATORY DATA:  I have reviewed the data as listed    Component Value Date/Time   NA 137 08/05/2017 1133   NA 138 02/17/2017 0810   K 3.6 08/05/2017 1133   K 3.7 02/17/2017 0810   CL 101 08/05/2017 1133   CO2 28 08/05/2017 1133   CO2 27 02/17/2017 0810   GLUCOSE 132 08/05/2017 1133   GLUCOSE 92 02/17/2017 0810   BUN 32 (H) 08/05/2017 1133   BUN 16.4 02/17/2017 0810   CREATININE 1.22 (H) 08/05/2017 1133   CREATININE 1.1 02/17/2017 0810   CALCIUM 9.2 08/05/2017 1133   CALCIUM 9.3 02/17/2017 0810   PROT 5.8 (L) 08/05/2017 1133   PROT 6.4 02/17/2017 0810   ALBUMIN 3.5 08/05/2017 1133   ALBUMIN 3.9 02/17/2017 0810   AST 15 08/05/2017 1133   AST 13 02/17/2017 0810   ALT 18 08/05/2017 1133   ALT 13 02/17/2017 0810   ALKPHOS 53 08/05/2017 1133   ALKPHOS 74 02/17/2017 0810   BILITOT 0.4 08/05/2017 1133   BILITOT 0.60 02/17/2017 0810   GFRNONAA 38 (L) 08/05/2017 1133   GFRAA 44 (L) 08/05/2017 1133     No results found for: SPEP, UPEP  Lab Results  Component Value Date   WBC 13.5 (H) 08/05/2017   NEUTROABS 6.0 08/05/2017   HGB 11.8 08/05/2017   HCT 36.1 08/05/2017   MCV 87.5 08/05/2017   PLT 237 08/05/2017      Chemistry      Component Value Date/Time   NA 137 08/05/2017 1133   NA 138 02/17/2017 0810   K 3.6 08/05/2017 1133   K 3.7 02/17/2017 0810   CL 101 08/05/2017 1133   CO2 28 08/05/2017 1133   CO2 27 02/17/2017 0810   BUN 32 (H) 08/05/2017 1133   BUN 16.4  02/17/2017 0810   CREATININE 1.22 (H) 08/05/2017 1133   CREATININE 1.1 02/17/2017 0810      Component Value Date/Time   CALCIUM 9.2 08/05/2017 1133   CALCIUM 9.3 02/17/2017 0810   ALKPHOS 53 08/05/2017 1133   ALKPHOS 74 02/17/2017 0810   AST 15 08/05/2017 1133   AST 13 02/17/2017 0810   ALT 18 08/05/2017 1133   ALT 13 02/17/2017 0810   BILITOT 0.4 08/05/2017 1133   BILITOT 0.60 02/17/2017 0810       RADIOGRAPHIC STUDIES: I have personally reviewed the radiological images as listed and agreed with the findings in the report. Dg Chest 2 View  Result Date: 07/21/2017 CLINICAL DATA:  Increased cough and congestion, history of CLL EXAM: CHEST - 2 VIEW COMPARISON:  Chest x-ray of 05/13/2016 FINDINGS: Bibasilar linear atelectasis or scarring remains. No pneumonia is seen. There may be tiny pleural effusions blunting the posterior costophrenic angles. Mediastinal and hilar contours are stable and the heart is mildly enlarged. Moderate thoracic aortic atherosclerosis is present. Right-sided Port-A-Cath tip overlies the mid lower SVC. There are degenerative changes present within the right shoulder. IMPRESSION: 1. Bibasilar linear atelectasis or scarring remains. 2. Can't exclude tiny pleural effusions. 3. Port-A-Cath tip overlies the mid lower SVC. Electronically Signed   By: Ivar Drape M.D.   On: 07/21/2017 11:51   Ct Abdomen Pelvis W Contrast  Result Date: 07/18/2017 CLINICAL DATA:  84 year old female with a  history of CLL and new crampy abdominal pain EXAM: CT ABDOMEN AND PELVIS WITH CONTRAST TECHNIQUE: Multidetector CT imaging of the abdomen and pelvis was performed using the standard protocol following bolus administration of intravenous contrast. CONTRAST:  84m ISOVUE-300 IOPAMIDOL (ISOVUE-300) INJECTION 61% COMPARISON:  04/30/2017, 10/21/2016, 05/08/2016 FINDINGS: Lower chest: No acute finding of the chest. Calcifications of left main, left anterior descending, circumflex, right coronary arteries. Hepatobiliary: Redemonstration of multiple low-density cystic structures of liver parenchyma, the largest in the left liver measuring 19 mm and 17 mm, most likely benign cysts. Unremarkable gallbladder Pancreas: Unremarkable pancreas Spleen: Low-density lesions at the posterior aspect of the spleen are unchanged compared to prior CT studies, with the largest measuring 9 mm, potentially related to the patient's primary diagnosis of CLL. Adrenals/Urinary Tract: Unremarkable appearance of the adrenal glands. Unremarkable left kidney with no hydronephrosis or nephrolithiasis. Right kidney with no hydronephrosis or nephrolithiasis. Low-density structure at the upper right kidney is unchanged over time, most likely a complex cyst. There are additional low-density lesions which are too small to characterize. Unremarkable course the bilateral ureters. Unremarkable urinary bladder. Stomach/Bowel: Inflammatory changes of the sigmoid colon and descending colon with no evidence of perforation. No abscess. There are extensive diverticular changes throughout this segment of colon. No evidence of obstruction. Cecum unremarkable.  Normal appendix. Small bowel is decompressed with no transition point. No evidence of obstruction. Unremarkable stomach. Small hiatal hernia. Vascular/Lymphatic: Extensive calcifications of the abdominal aorta. The bilateral iliac and proximal femoral arteries are patent. Calcifications of the mesenteric  vasculature, with no evidence of obstruction. Calcifications at the origin the bilateral renal arteries, though remain patent. Redemonstration of extensive lymphadenopathy of the abdomen including mesenteric, periaortic/periaortic nodes, and pelvic nodes. Overall, the size of lymph nodes has decreased when compared to PET CT of 10/21/2016. Reproductive: Unremarkable appearance of the uterus and adnexa. Other: Fat containing umbilical hernia. Musculoskeletal: Multilevel degenerative changes of the spine. No displaced fracture. IMPRESSION: Acute inflammatory changes of the descending colon and sigmoid colon, compatible with acute diverticulitis/neutropenic enterocolitis in this  patient with leukopenia. Extensive lymph nodes within the abdomen/retroperitoneum, which are relatively decreased in size overall when compared to prior PET-CT of July 2018, likely reflecting treatment changes. Aortic atherosclerosis and associated coronary artery disease. Aortic Atherosclerosis (ICD10-I70.0). These results were called by telephone at the time of interpretation on 07/18/2017 at 11:27 am to Dr. Zenovia Jarred , who verbally acknowledged these results. Electronically Signed   By: Corrie Mckusick D.O.   On: 07/18/2017 11:27    All questions were answered. The patient knows to call the clinic with any problems, questions or concerns. No barriers to learning was detected.  I spent 30 minutes counseling the patient face to face. The total time spent in the appointment was 40 minutes and more than 50% was on counseling and review of test results  Heath Lark, MD 08/06/2017 11:36 AM

## 2017-08-06 NOTE — Assessment & Plan Note (Signed)
She has recurrent abdominal pain. Recent CT scan showed enterocolitis Her pain relapse upon discontinuation of antibiotic treatment Clinically, I do not believe she needs antibiotic treatment today I recommend conservative management with IV fluids and bowel rest if possible I will consult [facility] to deliver IV fluids 2-3 times a week for supportive management

## 2017-08-06 NOTE — Telephone Encounter (Signed)
Notified [name] of referral per Dr Alvy Bimler for home health nursing for IVF.  Carla Conway was able to bring up the referral and she said visits will start tomorrow.

## 2017-08-06 NOTE — Assessment & Plan Note (Signed)
The patient has ongoing issues with recurrent abdominal pain secondary to colitis I do not believe this is related to CLL I would like to hold off chemotherapy for now I recommend prednisone daily at 10 mg and plan to see her back in 3 weeks for further assessment Recent CT imaging showed positive response to chemotherapy

## 2017-08-06 NOTE — Assessment & Plan Note (Signed)
She has generalized weakness and debility since recurrent hospitalization She is back at home She is getting home physical therapy We will complement home care with advanced home care nursing visit at least once a week to check on her and to deliver supportive care at home if possible The patient and her daughter agree with the plan of care

## 2017-08-07 ENCOUNTER — Telehealth: Payer: Self-pay

## 2017-08-07 NOTE — Telephone Encounter (Signed)
Received VM from patient's daughter [name] and [place] with [facility] that daughter prefers to not do IVF at this time. Then received call that pt actually has [facility] as her home health provider.     Returned daughter Derwood Kaplan call, she would like me to ask Dr Alvy Bimler if they can just push po fluids "water" for right now due to the daughter is going out of the country 10-20th of May and she doesn't want the patient to have to worry about the port while she's gone.    She said the [employer] was going to teach the daughter how to give the fluids but daughter said she thought this was just an option to consider and she would first like to see if patient can try pushing fluids over weekend and then if they find they need the IV fluids then she will contact us first of next week.  Will route to Dr Alvy Bimler and call daughter back.

## 2017-08-07 NOTE — Telephone Encounter (Signed)
Notified pt's daughter [name] of the following per Dr Alvy Bimler:  OK to just do oral fluids as tolerated Hold off home care IVF        Daughter [name] verbalized she will let us know how oral fluids are tolerated and will call if IVF are needed.  I notified [facility] nurse [place] and Drue Dun of above per Dr Alvy Bimler as well. No other needs per daughter at this time.

## 2017-08-07 NOTE — Telephone Encounter (Signed)
OK to just do oral fluids as tolerated Hold off home care IVF

## 2017-08-26 ENCOUNTER — Inpatient Hospital Stay (HOSPITAL_BASED_OUTPATIENT_CLINIC_OR_DEPARTMENT_OTHER): Payer: Medicare Other | Admitting: Hematology and Oncology

## 2017-08-26 ENCOUNTER — Inpatient Hospital Stay: Payer: Medicare Other

## 2017-08-26 ENCOUNTER — Telehealth: Payer: Self-pay | Admitting: Hematology and Oncology

## 2017-08-26 DIAGNOSIS — C9110 Chronic lymphocytic leukemia of B-cell type not having achieved remission: Secondary | ICD-10-CM

## 2017-08-26 DIAGNOSIS — R64 Cachexia: Secondary | ICD-10-CM | POA: Diagnosis not present

## 2017-08-26 DIAGNOSIS — C8300 Small cell B-cell lymphoma, unspecified site: Secondary | ICD-10-CM

## 2017-08-26 DIAGNOSIS — I10 Essential (primary) hypertension: Secondary | ICD-10-CM

## 2017-08-26 DIAGNOSIS — R413 Other amnesia: Secondary | ICD-10-CM

## 2017-08-26 DIAGNOSIS — Z95828 Presence of other vascular implants and grafts: Secondary | ICD-10-CM

## 2017-08-26 LAB — CBC WITH DIFFERENTIAL/PLATELET
BASOS ABS: 0.1 10*3/uL (ref 0.0–0.1)
Basophils Relative: 1 %
EOS ABS: 0.2 10*3/uL (ref 0.0–0.5)
Eosinophils Relative: 3 %
HEMATOCRIT: 38.9 % (ref 34.8–46.6)
HEMOGLOBIN: 12.8 g/dL (ref 11.6–15.9)
Lymphocytes Relative: 82 %
Lymphs Abs: 5.7 10*3/uL — ABNORMAL HIGH (ref 0.9–3.3)
MCH: 28.8 pg (ref 25.1–34.0)
MCHC: 32.9 g/dL (ref 31.5–36.0)
MCV: 87.5 fL (ref 79.5–101.0)
Monocytes Absolute: 0 10*3/uL — ABNORMAL LOW (ref 0.1–0.9)
Monocytes Relative: 0 %
NEUTROS ABS: 1 10*3/uL — AB (ref 1.5–6.5)
Neutrophils Relative %: 14 %
Platelets: 242 10*3/uL (ref 145–400)
RBC: 4.44 MIL/uL (ref 3.70–5.45)
RDW: 16.7 % — ABNORMAL HIGH (ref 11.2–14.5)
WBC: 7 10*3/uL (ref 3.9–10.3)

## 2017-08-26 LAB — COMPREHENSIVE METABOLIC PANEL
ALK PHOS: 55 U/L (ref 40–150)
ALT: 14 U/L (ref 0–55)
ANION GAP: 9 (ref 3–11)
AST: 14 U/L (ref 5–34)
Albumin: 3.7 g/dL (ref 3.5–5.0)
BUN: 21 mg/dL (ref 7–26)
CALCIUM: 9.4 mg/dL (ref 8.4–10.4)
CO2: 28 mmol/L (ref 22–29)
Chloride: 102 mmol/L (ref 98–109)
Creatinine, Ser: 1.14 mg/dL — ABNORMAL HIGH (ref 0.60–1.10)
GFR calc Af Amer: 48 mL/min — ABNORMAL LOW (ref >60)
GFR calc non Af Amer: 41 mL/min — ABNORMAL LOW (ref >60)
GLUCOSE: 130 mg/dL (ref 70–140)
Potassium: 3.4 mmol/L — ABNORMAL LOW (ref 3.5–5.1)
SODIUM: 139 mmol/L (ref 136–145)
Total Bilirubin: 0.4 mg/dL (ref 0.2–1.2)
Total Protein: 5.9 g/dL — ABNORMAL LOW (ref 6.4–8.3)

## 2017-08-26 LAB — URIC ACID: URIC ACID, SERUM: 4.5 mg/dL (ref 2.6–7.4)

## 2017-08-26 MED ORDER — SODIUM CHLORIDE 0.9% FLUSH
10.0000 mL | Freq: Once | INTRAVENOUS | Status: AC
  Administered 2017-08-26: 10 mL
  Filled 2017-08-26: qty 10

## 2017-08-26 NOTE — Telephone Encounter (Signed)
Gave avs and calendar ° °

## 2017-08-26 NOTE — Progress Notes (Signed)
[facility] OFFICE PROGRESS NOTE  Patient Care Team: Koirala, Dibas, MD as PCP - General (Family Medicine) Heath Lark, MD as Consulting Physician (Hematology and Oncology)  ASSESSMENT & PLAN:  CLL (chronic lymphocytic leukemia) ([place]) The patient tolerated treatment very poorly She also have a decline in overall performance status She responded well with low-dose prednisone I recommend she continues to same I plan gentle prednisone taper in the future I plan to see her back in the month for further follow-up  Poor memory She has decline in performance status and memory I suspect she might have dementia We discussed potential transferring her to a assisted living facility due to concern of her safety  Malignant cachexia ([place]) The patient has not lost weight but her appetite is very poor I suspect it could be due to recent abrupt withdrawal of prednisone She appears to be eating better since her daughter restarted her on prednisone treatment I recommend she continue same  HTN (hypertension) She has poorly controlled hypertension due to anxiety We will continue close monitoring for now   No orders of the defined types were placed in this encounter.   INTERVAL HISTORY: Please see below for problem oriented charting. She returns with her daughter for further follow-up The patient has significant decline in memory Her daughter was out of town recently and hire some caregivers to watch the patient The patient has declined home health care nursing visit According to the daughter, caregivers were not giving the patient any medications at all from 08/21/2017 to 08/24/2017 She complained of significant weakness, poor appetite and ruminates about dying There were no reported recent infection She denies abdominal pain No new lymphadenopathy  SUMMARY OF ONCOLOGIC HISTORY:   CLL (chronic lymphocytic leukemia) ([place])   05/02/2013 Initial Diagnosis    She was found to have  abnormal CBC from at least since 2015. According to the records from her primary care doctor that were faxed to the [facility], she had leukocytosis at least since January 2015. On 05/02/2013, total white blood cell count was 28.3, normal hemoglobin 13.4 and platelet count 239,000. She had predominantly Lymphocytosis. On 06/20/2013, a CBC show white count of 26.7, hemoglobin 12.9 and platelet count of 299,000 On admission, on 05/08/2016, white blood cell count was 38.4, hemoglobin 12.1 and platelet count of 210. Differential show predominantly lymphocytosis.      05/08/2016 Imaging    CT scan of chest 1. Negative for acute pulmonary embolism 2. Numerous pathologic nodes in the mediastinum, hila, axillae, supraclavicular regions and upper abdomen. The adenopathy is suspicious for malignancy such as lymphoma or CLL. If tissue diagnosis is desired, many of the supraclavicular and axillary nodes should be palpable and accessible to excisional biopsy. 3. Low-attenuation liver lesions, incompletely imaged and not characterized but more likely benign. 4. Small hiatal hernia.      09/22/2016 Pathology Results    Peripheral Blood Flow Cytometry - CHRONIC LYMPHOCYTIC LEUKEMIA. - SEE COMMENT. Diagnosis Comment: There is a population of monoclonal B-cells with expression of CD5 and CD23. The phenotype is consistent with chronic lymphocytic leukemia      09/22/2016 Pathology Results    FISH confirmed trisomy 12      10/21/2016 PET scan    1. Extensive adenopathy in the neck, chest, abdomen, and pelvis as detailed above, primarily Deauville 3 and [place] 4. Mild splenomegaly. 2. High activity in some soft tissue prominence along the anal region, possibly physiologic or due to hemorrhoidal activity or urinary incontinence, but  correlation with anorectal exam recommended. 3. Focal abnormal accentuated high activity in the sigmoid colon along a region of abnormal stranding and diverticulosis,  suspicious for active diverticulitis. Correlation with the patient's colon cancer screening history is recommended. If screening is not up-to-date, appropriate screening should be considered. 4. Aortic Atherosclerosis (ICD10-I70.0). Coronary atherosclerosis. 5. An exophytic high density lesion from the right kidney upper pole does not appear to be hypermetabolic and accordingly is most likely to be a benign complex cyst, but this lesion is not fully characterized on today' s exam. If the patient has hematuria or if otherwise clinically warranted, renal protocol MRI or CT examination could be utilized. 6. Some of the smaller hepatic lesions are nonspecific, but the larger hepatic lesions are photopenic and likely cysts.      10/23/2016 Procedure    Placement of single lumen port a cath via right internal jugular vein. The catheter tip lies at the cavo-atrial junction. A power injectable port a cath was placed and is ready for immediate use      10/29/2016 - 10/30/2016 Hospital Admission    She was admitted after allergic reaction to Rituximab      10/29/2016 Adverse Reaction    She received Rituximab complicated by allergic reaction. Treatment was abandoned      11/20/2016 - 04/28/2017 Chemotherapy    The patient is started on chlorambucil and low dose prednisone      04/29/2017 - 05/01/2017 Hospital Admission    She was admitted to the hospital for management of abdominal pain and weakness.      04/30/2017 Imaging    1. Mixed appearance with regard to the extensive nodal disease in the chest, abdomen, and pelvis. Areas of improved adenopathy include the supraclavicular, axillary, and periaortic adenopathy. Areas of worsening include the subcarinal and right inguinal adenopathy. Some of the nodes are very similar to their size on prior PET-CT from 10/21/2016. 2. There is advanced sigmoid diverticulosis and just cephalad to this, there is a region of nodularity and mesenteric infiltration which  has progressed compared to 10/21/2016. This may well represent an infiltrative component of malignancy rather than necessarily being due to chronic diverticulitis. No extraluminal gas is identified. No abscess noted. 3. Upper normal splenic size. There are several hypodense nonspecific splenic lesions. These were not readily appreciable on prior noncontrast images, but no associated hypermetabolic activity was previously seen. 4. Hyperdense 1.9 cm right kidney upper pole lesion is not entirely specific. This is probably a complex cyst. Attention on follow up studies suggested. 5. Transverse sclerosis in the sternal body compatible with late phase healing fracture. This was not present on the prior PET-CT from 10/21/2016. Reviewing the patient's records, she apparently received CPR after an anaphylactic reaction to chemotherapy on or around 10/29/2016, and it is possible (although speculative) that this represents residua from sternal trauma during CPR. Correlate with any extended soreness in the chest after that episode. 6. Other imaging findings of potential clinical significance: Aortic Atherosclerosis (ICD10-I70.0). Coronary atherosclerosis. Airway thickening is present, suggesting bronchitis or reactive airways disease. Scattered hepatic cysts. Multilevel lumbar impingement due to spondylosis and degenerative disc disease.       07/18/2017 - 07/23/2017 Hospital Admission    She was admitted to the hospital for enterocolitis      07/18/2017 Imaging    Ct abdomen and pelvis Acute inflammatory changes of the descending colon and sigmoid colon, compatible with acute diverticulitis/neutropenic enterocolitis in this patient with leukopenia.  Extensive lymph nodes within the abdomen/retroperitoneum,  which are relatively decreased in size overall when compared to prior PET-CT of July 2018, likely reflecting treatment changes.  Aortic atherosclerosis and associated coronary artery disease. Aortic  Atherosclerosis (ICD10-I70.0).       REVIEW OF SYSTEMS:   Constitutional: Denies fevers, chills or abnormal weight loss Eyes: Denies blurriness of vision Ears, nose, mouth, throat, and face: Denies mucositis or sore throat Respiratory: Denies cough, dyspnea or wheezes Cardiovascular: Denies palpitation, chest discomfort or lower extremity swelling Gastrointestinal:  Denies nausea, heartburn or change in bowel habits Skin: Denies abnormal skin rashes Lymphatics: Denies new lymphadenopathy or easy bruising Neurological:Denies numbness, tingling or new weaknesses Behavioral/Psych: Mood is stable, no new changes  All other systems were reviewed with the patient and are negative.  I have reviewed the past medical history, past surgical history, social history and family history with the patient and they are unchanged from previous note.  ALLERGIES:  is allergic to rituximab.  MEDICATIONS:  Current Outpatient Medications  Medication Sig Dispense Refill  . acetaminophen (TYLENOL) 500 MG tablet Take 650 mg by mouth daily as needed for mild pain or headache.     Marland Kitchen acyclovir (ZOVIRAX) 400 MG tablet Take 1 tablet (400 mg total) by mouth daily. 30 tablet 3  . aspirin EC 81 MG EC tablet Take 1 tablet (81 mg total) by mouth daily. 30 tablet 2  . COMBIVENT RESPIMAT 20-100 MCG/ACT AERS respimat Take 2 puffs by mouth every 6 (six) hours as needed for wheezing or shortness of breath.     . isosorbide mononitrate (IMDUR) 30 MG 24 hr tablet Take 2 tablets (60 mg total) by mouth daily. 60 tablet 0  . lidocaine-prilocaine (EMLA) cream Apply 1 application topically as needed. (Patient taking differently: Apply 1 application topically as needed (For port-a-cath.). ) 30 g 6  . lovastatin (MEVACOR) 40 MG tablet Take 40 mg by mouth at bedtime.     . metoprolol tartrate (LOPRESSOR) 25 MG tablet Take 1 tablet (25 mg total) by mouth 2 (two) times daily. 60 tablet 2  . montelukast (SINGULAIR) 10 MG tablet Take 10  mg by mouth at bedtime.  4  . Nutritional Supplements (ENSURE PO) Take 1 Can by mouth daily.    Marland Kitchen omeprazole (PRILOSEC) 20 MG capsule Take 1 capsule (20 mg total) by mouth daily. 30 capsule 11  . ondansetron (ZOFRAN) 8 MG tablet Take 1 tablet (8 mg total) by mouth 2 (two) times daily as needed for refractory nausea / vomiting. Start on day 3 after chemotherapy. 30 tablet 1  . potassium chloride (K-DUR) 10 MEQ tablet Take 10 mEq by mouth daily.    . predniSONE (DELTASONE) 20 MG tablet TAKE 1 TABLET(20 MG) BY MOUTH DAILY WITH BREAKFAST (Patient taking differently: TAKE 20 MG BY MOUTH EVERY OTHER DAY WITH BREAKFAST ALTERNATING WITH 10 MG EVERY OTHER DAY) 30 tablet 0  . prochlorperazine (COMPAZINE) 10 MG tablet Take 1 tablet (10 mg total) by mouth every 6 (six) hours as needed (Nausea or vomiting). 30 tablet 1  . QUEtiapine (SEROQUEL) 25 MG tablet Take 12.5 mg by mouth at bedtime.     . sertraline (ZOLOFT) 100 MG tablet Take 100 mg by mouth daily.  3   No current facility-administered medications for this visit.     PHYSICAL EXAMINATION: ECOG PERFORMANCE STATUS: 2 - Symptomatic, <50% confined to bed  Vitals:   08/26/17 1141  BP: (!) 151/92  Pulse: 85  Resp: 18  Temp: 97.6 F (36.4 C)  SpO2: 93%  Filed Weights   08/26/17 1141  Weight: 137 lb 8 oz (62.4 kg)    GENERAL:alert, no distress and comfortable SKIN: skin color, texture, turgor are normal, no rashes or significant lesions EYES: normal, Conjunctiva are pink and non-injected, sclera clear OROPHARYNX:no exudate, no erythema and lips, buccal mucosa, and tongue normal  NECK: supple, thyroid normal size, non-tender, without nodularity LYMPH:  no palpable lymphadenopathy in the cervical, axillary or inguinal LUNGS: clear to auscultation and percussion with normal breathing effort HEART: regular rate & rhythm and no murmurs and no lower extremity edema ABDOMEN:abdomen soft, non-tender and normal bowel sounds Musculoskeletal:no  cyanosis of digits and no clubbing  NEURO: alert & oriented x 3 with fluent speech, no focal motor/sensory deficits  LABORATORY DATA:  I have reviewed the data as listed    Component Value Date/Time   NA 139 08/26/2017 1049   NA 138 02/17/2017 0810   K 3.4 (L) 08/26/2017 1049   K 3.7 02/17/2017 0810   CL 102 08/26/2017 1049   CO2 28 08/26/2017 1049   CO2 27 02/17/2017 0810   GLUCOSE 130 08/26/2017 1049   GLUCOSE 92 02/17/2017 0810   BUN 21 08/26/2017 1049   BUN 16.4 02/17/2017 0810   CREATININE 1.14 (H) 08/26/2017 1049   CREATININE 1.1 02/17/2017 0810   CALCIUM 9.4 08/26/2017 1049   CALCIUM 9.3 02/17/2017 0810   PROT 5.9 (L) 08/26/2017 1049   PROT 6.4 02/17/2017 0810   ALBUMIN 3.7 08/26/2017 1049   ALBUMIN 3.9 02/17/2017 0810   AST 14 08/26/2017 1049   AST 13 02/17/2017 0810   ALT 14 08/26/2017 1049   ALT 13 02/17/2017 0810   ALKPHOS 55 08/26/2017 1049   ALKPHOS 74 02/17/2017 0810   BILITOT 0.4 08/26/2017 1049   BILITOT 0.60 02/17/2017 0810   GFRNONAA 41 (L) 08/26/2017 1049   GFRAA 48 (L) 08/26/2017 1049    No results found for: SPEP, UPEP  Lab Results  Component Value Date   WBC 7.0 08/26/2017   NEUTROABS 1.0 (L) 08/26/2017   HGB 12.8 08/26/2017   HCT 38.9 08/26/2017   MCV 87.5 08/26/2017   PLT 242 08/26/2017      Chemistry      Component Value Date/Time   NA 139 08/26/2017 1049   NA 138 02/17/2017 0810   K 3.4 (L) 08/26/2017 1049   K 3.7 02/17/2017 0810   CL 102 08/26/2017 1049   CO2 28 08/26/2017 1049   CO2 27 02/17/2017 0810   BUN 21 08/26/2017 1049   BUN 16.4 02/17/2017 0810   CREATININE 1.14 (H) 08/26/2017 1049   CREATININE 1.1 02/17/2017 0810      Component Value Date/Time   CALCIUM 9.4 08/26/2017 1049   CALCIUM 9.3 02/17/2017 0810   ALKPHOS 55 08/26/2017 1049   ALKPHOS 74 02/17/2017 0810   AST 14 08/26/2017 1049   AST 13 02/17/2017 0810   ALT 14 08/26/2017 1049   ALT 13 02/17/2017 0810   BILITOT 0.4 08/26/2017 1049   BILITOT 0.60  02/17/2017 0810       All questions were answered. The patient knows to call the clinic with any problems, questions or concerns. No barriers to learning was detected.  I spent 15 minutes counseling the patient face to face. The total time spent in the appointment was 20 minutes and more than 50% was on counseling and review of test results  Heath Lark, MD 08/27/2017 7:42 AM

## 2017-08-27 ENCOUNTER — Encounter: Payer: Self-pay | Admitting: Hematology and Oncology

## 2017-08-27 NOTE — Assessment & Plan Note (Signed)
The patient has not lost weight but her appetite is very poor I suspect it could be due to recent abrupt withdrawal of prednisone She appears to be eating better since her daughter restarted her on prednisone treatment I recommend she continue same

## 2017-08-27 NOTE — Assessment & Plan Note (Signed)
She has poorly controlled hypertension due to anxiety We will continue close monitoring for now

## 2017-08-27 NOTE — Assessment & Plan Note (Signed)
She has decline in performance status and memory I suspect she might have dementia We discussed potential transferring her to a assisted living facility due to concern of her safety

## 2017-08-27 NOTE — Assessment & Plan Note (Signed)
The patient tolerated treatment very poorly She also have a decline in overall performance status She responded well with low-dose prednisone I recommend she continues to same I plan gentle prednisone taper in the future I plan to see her back in the month for further follow-up

## 2017-09-15 ENCOUNTER — Other Ambulatory Visit: Payer: Self-pay | Admitting: Hematology and Oncology

## 2017-09-25 ENCOUNTER — Inpatient Hospital Stay: Payer: Medicare Other | Attending: Hematology and Oncology | Admitting: Hematology and Oncology

## 2017-09-25 ENCOUNTER — Telehealth: Payer: Self-pay | Admitting: Hematology and Oncology

## 2017-09-25 ENCOUNTER — Inpatient Hospital Stay: Payer: Medicare Other

## 2017-09-25 ENCOUNTER — Telehealth: Payer: Self-pay

## 2017-09-25 ENCOUNTER — Encounter: Payer: Self-pay | Admitting: Hematology and Oncology

## 2017-09-25 DIAGNOSIS — R5383 Other fatigue: Secondary | ICD-10-CM | POA: Diagnosis not present

## 2017-09-25 DIAGNOSIS — C9110 Chronic lymphocytic leukemia of B-cell type not having achieved remission: Secondary | ICD-10-CM

## 2017-09-25 DIAGNOSIS — C8300 Small cell B-cell lymphoma, unspecified site: Secondary | ICD-10-CM

## 2017-09-25 DIAGNOSIS — C9190 Lymphoid leukemia, unspecified not having achieved remission: Secondary | ICD-10-CM

## 2017-09-25 DIAGNOSIS — I10 Essential (primary) hypertension: Secondary | ICD-10-CM | POA: Diagnosis not present

## 2017-09-25 DIAGNOSIS — R413 Other amnesia: Secondary | ICD-10-CM

## 2017-09-25 DIAGNOSIS — Z79899 Other long term (current) drug therapy: Secondary | ICD-10-CM

## 2017-09-25 LAB — CBC WITH DIFFERENTIAL/PLATELET
Basophils Absolute: 0.2 10*3/uL — ABNORMAL HIGH (ref 0.0–0.1)
Basophils Relative: 2 %
Eosinophils Absolute: 0.4 10*3/uL (ref 0.0–0.5)
Eosinophils Relative: 3 %
HCT: 40.6 % (ref 34.8–46.6)
HEMOGLOBIN: 13 g/dL (ref 11.6–15.9)
LYMPHS PCT: 68 %
Lymphs Abs: 9.3 10*3/uL — ABNORMAL HIGH (ref 0.9–3.3)
MCH: 27.7 pg (ref 25.1–34.0)
MCHC: 32.1 g/dL (ref 31.5–36.0)
MCV: 86.4 fL (ref 79.5–101.0)
MONO ABS: 0.8 10*3/uL (ref 0.1–0.9)
MONOS PCT: 6 %
NEUTROS ABS: 2.7 10*3/uL (ref 1.5–6.5)
Neutrophils Relative %: 21 %
PLATELETS: 232 10*3/uL (ref 145–400)
RBC: 4.7 MIL/uL (ref 3.70–5.45)
RDW: 15.8 % — ABNORMAL HIGH (ref 11.2–14.5)
WBC: 13.4 10*3/uL — ABNORMAL HIGH (ref 3.9–10.3)

## 2017-09-25 LAB — COMPREHENSIVE METABOLIC PANEL
ALBUMIN: 3.7 g/dL (ref 3.5–5.0)
ALT: 11 U/L (ref 0–55)
AST: 13 U/L (ref 5–34)
Alkaline Phosphatase: 64 U/L (ref 40–150)
Anion gap: 9 (ref 3–11)
BUN: 18 mg/dL (ref 7–26)
CHLORIDE: 104 mmol/L (ref 98–109)
CO2: 29 mmol/L (ref 22–29)
CREATININE: 1.08 mg/dL (ref 0.60–1.10)
Calcium: 9.1 mg/dL (ref 8.4–10.4)
GFR calc non Af Amer: 44 mL/min — ABNORMAL LOW (ref >60)
GFR, EST AFRICAN AMERICAN: 51 mL/min — AB (ref >60)
GLUCOSE: 101 mg/dL (ref 70–140)
Potassium: 3 mmol/L — CL (ref 3.5–5.1)
SODIUM: 142 mmol/L (ref 136–145)
Total Bilirubin: 0.3 mg/dL (ref 0.2–1.2)
Total Protein: 6 g/dL — ABNORMAL LOW (ref 6.4–8.3)

## 2017-09-25 LAB — URIC ACID: Uric Acid, Serum: 4.3 mg/dL (ref 2.6–7.4)

## 2017-09-25 NOTE — Telephone Encounter (Signed)
Called and spoke with daughter. Per Dr. Alvy Bimler given potassium results of 3. and instructed to eat a potassium rich diet. Daughter verbalized understanding, she states that she will look up on line high potassium foods.

## 2017-09-25 NOTE — Assessment & Plan Note (Signed)
She is noted to have progressive decline in memory She is currently in the process of transitioning her living situation to assisted living facility and I fully support that decision

## 2017-09-25 NOTE — Assessment & Plan Note (Signed)
She will continue blood pressure management as directed

## 2017-09-25 NOTE — Progress Notes (Signed)
[place] OFFICE PROGRESS NOTE  Patient Care Team: Koirala, Dibas, MD as PCP - General (Family Medicine) Heath Lark, MD as Consulting Physician (Hematology and Oncology)  ASSESSMENT & PLAN:  CLL (chronic lymphocytic leukemia) ([place]) She appears to be responding well to 10 mg of prednisone daily with improved in weight and less abdominal pain Due to significant decline in overall performance status and her memory, I recommend continue to same and I plan to see her back again in 3 months  Poor memory She is noted to have progressive decline in memory She is currently in the process of transitioning her living situation to assisted living facility and I fully support that decision  HTN (hypertension) She will continue blood pressure management as directed  Other fatigue Her daughter has noted that the patient sleep more than 12 hours a day I recommend consideration to stop quetiapine which is known to cause sedation Her last TSH done in January 2019 was within normal limits   No orders of the defined types were placed in this encounter.   INTERVAL HISTORY: Please see below for problem oriented charting. She returns with her daughter for further follow-up She is tolerating low-dose prednisone well Her daughter noted that the patient has  significant fatigue, progressive decline in overall performance status and declining memory She is transitioning to living situation into assisted living facility She is eating better and has gained some weight She complained of less abdominal pain No recent infection or new lymphadenopathy  SUMMARY OF ONCOLOGIC HISTORY:   CLL (chronic lymphocytic leukemia) ([place])   05/02/2013 Initial Diagnosis    She was found to have abnormal CBC from at least since 2015. According to the records from her primary care doctor that were faxed to the [facility], she had leukocytosis at least since January 2015. On 05/02/2013, total white blood  cell count was 28.3, normal hemoglobin 13.4 and platelet count 239,000. She had predominantly Lymphocytosis. On 06/20/2013, a CBC show white count of 26.7, hemoglobin 12.9 and platelet count of 299,000 On admission, on 05/08/2016, white blood cell count was 38.4, hemoglobin 12.1 and platelet count of 210. Differential show predominantly lymphocytosis.      05/08/2016 Imaging    CT scan of chest 1. Negative for acute pulmonary embolism 2. Numerous pathologic nodes in the mediastinum, hila, axillae, supraclavicular regions and upper abdomen. The adenopathy is suspicious for malignancy such as lymphoma or CLL. If tissue diagnosis is desired, many of the supraclavicular and axillary nodes should be palpable and accessible to excisional biopsy. 3. Low-attenuation liver lesions, incompletely imaged and not characterized but more likely benign. 4. Small hiatal hernia.      09/22/2016 Pathology Results    Peripheral Blood Flow Cytometry - CHRONIC LYMPHOCYTIC LEUKEMIA. - SEE COMMENT. Diagnosis Comment: There is a population of monoclonal B-cells with expression of CD5 and CD23. The phenotype is consistent with chronic lymphocytic leukemia      09/22/2016 Pathology Results    FISH confirmed trisomy 12      10/21/2016 PET scan    1. Extensive adenopathy in the neck, chest, abdomen, and pelvis as detailed above, primarily Deauville 3 and Deauville 4. Mild splenomegaly. 2. High activity in some soft tissue prominence along the anal region, possibly physiologic or due to hemorrhoidal activity or urinary incontinence, but correlation with anorectal exam recommended. 3. Focal abnormal accentuated high activity in the sigmoid colon along a region of abnormal stranding and diverticulosis, suspicious for active diverticulitis. Correlation with the patient's  colon cancer screening history is recommended. If screening is not up-to-date, appropriate screening should be considered. 4. Aortic Atherosclerosis  (ICD10-I70.0). Coronary atherosclerosis. 5. An exophytic high density lesion from the right kidney upper pole does not appear to be hypermetabolic and accordingly is most likely to be a benign complex cyst, but this lesion is not fully characterized on today' s exam. If the patient has hematuria or if otherwise clinically warranted, renal protocol MRI or CT examination could be utilized. 6. Some of the smaller hepatic lesions are nonspecific, but the larger hepatic lesions are photopenic and likely cysts.      10/23/2016 Procedure    Placement of single lumen port a cath via right internal jugular vein. The catheter tip lies at the cavo-atrial junction. A power injectable port a cath was placed and is ready for immediate use      10/29/2016 - 10/30/2016 Hospital Admission    She was admitted after allergic reaction to Rituximab      10/29/2016 Adverse Reaction    She received Rituximab complicated by allergic reaction. Treatment was abandoned      11/20/2016 - 04/28/2017 Chemotherapy    The patient is started on chlorambucil and low dose prednisone      04/29/2017 - 05/01/2017 Hospital Admission    She was admitted to the hospital for management of abdominal pain and weakness.      04/30/2017 Imaging    1. Mixed appearance with regard to the extensive nodal disease in the chest, abdomen, and pelvis. Areas of improved adenopathy include the supraclavicular, axillary, and periaortic adenopathy. Areas of worsening include the subcarinal and right inguinal adenopathy. Some of the nodes are very similar to their size on prior PET-CT from 10/21/2016. 2. There is advanced sigmoid diverticulosis and just cephalad to this, there is a region of nodularity and mesenteric infiltration which has progressed compared to 10/21/2016. This may well represent an infiltrative component of malignancy rather than necessarily being due to chronic diverticulitis. No extraluminal gas is identified. No abscess noted. 3.  Upper normal splenic size. There are several hypodense nonspecific splenic lesions. These were not readily appreciable on prior noncontrast images, but no associated hypermetabolic activity was previously seen. 4. Hyperdense 1.9 cm right kidney upper pole lesion is not entirely specific. This is probably a complex cyst. Attention on follow up studies suggested. 5. Transverse sclerosis in the sternal body compatible with late phase healing fracture. This was not present on the prior PET-CT from 10/21/2016. Reviewing the patient's records, she apparently received CPR after an anaphylactic reaction to chemotherapy on or around 10/29/2016, and it is possible (although speculative) that this represents residua from sternal trauma during CPR. Correlate with any extended soreness in the chest after that episode. 6. Other imaging findings of potential clinical significance: Aortic Atherosclerosis (ICD10-I70.0). Coronary atherosclerosis. Airway thickening is present, suggesting bronchitis or reactive airways disease. Scattered hepatic cysts. Multilevel lumbar impingement due to spondylosis and degenerative disc disease.       07/18/2017 - 07/23/2017 Hospital Admission    She was admitted to the hospital for enterocolitis      07/18/2017 Imaging    Ct abdomen and pelvis Acute inflammatory changes of the descending colon and sigmoid colon, compatible with acute diverticulitis/neutropenic enterocolitis in this patient with leukopenia.  Extensive lymph nodes within the abdomen/retroperitoneum, which are relatively decreased in size overall when compared to prior PET-CT of July 2018, likely reflecting treatment changes.  Aortic atherosclerosis and associated coronary artery disease. Aortic Atherosclerosis (ICD10-I70.0).  REVIEW OF SYSTEMS:   Constitutional: Denies fevers, chills or abnormal weight loss Eyes: Denies blurriness of vision Ears, nose, mouth, throat, and face: Denies mucositis or sore  throat Respiratory: Denies cough, dyspnea or wheezes Cardiovascular: Denies palpitation, chest discomfort or lower extremity swelling Gastrointestinal:  Denies nausea, heartburn or change in bowel habits Skin: Denies abnormal skin rashes Lymphatics: Denies new lymphadenopathy or easy bruising Behavioral/Psych: Mood is stable, no new changes  All other systems were reviewed with the patient and are negative.  I have reviewed the past medical history, past surgical history, social history and family history with the patient and they are unchanged from previous note.  ALLERGIES:  is allergic to rituximab.  MEDICATIONS:  Current Outpatient Medications  Medication Sig Dispense Refill  . acetaminophen (TYLENOL) 500 MG tablet Take 650 mg by mouth daily as needed for mild pain or headache.     Marland Kitchen acyclovir (ZOVIRAX) 400 MG tablet Take 1 tablet (400 mg total) by mouth daily. 30 tablet 3  . aspirin EC 81 MG EC tablet Take 1 tablet (81 mg total) by mouth daily. 30 tablet 2  . COMBIVENT RESPIMAT 20-100 MCG/ACT AERS respimat Take 2 puffs by mouth every 6 (six) hours as needed for wheezing or shortness of breath.     . isosorbide mononitrate (IMDUR) 30 MG 24 hr tablet Take 2 tablets (60 mg total) by mouth daily. 60 tablet 0  . lidocaine-prilocaine (EMLA) cream Apply 1 application topically as needed. (Patient taking differently: Apply 1 application topically as needed (For port-a-cath.). ) 30 g 6  . lovastatin (MEVACOR) 40 MG tablet Take 40 mg by mouth at bedtime.     . metoprolol tartrate (LOPRESSOR) 25 MG tablet Take 1 tablet (25 mg total) by mouth 2 (two) times daily. 60 tablet 2  . montelukast (SINGULAIR) 10 MG tablet Take 10 mg by mouth at bedtime.  4  . Nutritional Supplements (ENSURE PO) Take 1 Can by mouth daily.    Marland Kitchen omeprazole (PRILOSEC) 20 MG capsule Take 1 capsule (20 mg total) by mouth daily. 30 capsule 11  . ondansetron (ZOFRAN) 8 MG tablet Take 1 tablet (8 mg total) by mouth 2 (two) times  daily as needed for refractory nausea / vomiting. Start on day 3 after chemotherapy. 30 tablet 1  . potassium chloride (K-DUR) 10 MEQ tablet Take 10 mEq by mouth daily.    . predniSONE (DELTASONE) 20 MG tablet TAKE 20 MG BY MOUTH EVERY OTHER DAY WITH BREAKFAST ALTERNATING WITH 10 MG EVERY OTHER DAY 30 tablet 0  . prochlorperazine (COMPAZINE) 10 MG tablet Take 1 tablet (10 mg total) by mouth every 6 (six) hours as needed (Nausea or vomiting). 30 tablet 1  . QUEtiapine (SEROQUEL) 25 MG tablet Take 12.5 mg by mouth at bedtime.     . sertraline (ZOLOFT) 100 MG tablet Take 100 mg by mouth daily.  3   No current facility-administered medications for this visit.     PHYSICAL EXAMINATION: ECOG PERFORMANCE STATUS: 3 - Symptomatic, >50% confined to bed  Vitals:   09/25/17 1204  BP: (!) 161/70  Pulse: 76  Resp: 16  Temp: 97.9 F (36.6 C)  SpO2: 97%   Filed Weights   09/25/17 1204  Weight: 141 lb 4.8 oz (64.1 kg)    GENERAL:alert, no distress and comfortable SKIN: skin color, texture, turgor are normal, no rashes or significant lesions EYES: normal, Conjunctiva are pink and non-injected, sclera clear OROPHARYNX:no exudate, no erythema and lips, buccal mucosa, and tongue  normal  NECK: supple, thyroid normal size, non-tender, without nodularity LYMPH:  no palpable lymphadenopathy in the cervical, axillary or inguinal LUNGS: clear to auscultation and percussion with normal breathing effort HEART: regular rate & rhythm and no murmurs and no lower extremity edema ABDOMEN:abdomen soft, non-tender and normal bowel sounds Musculoskeletal:no cyanosis of digits and no clubbing  NEURO: alert & oriented x 3 with fluent speech, no focal motor/sensory deficits  LABORATORY DATA:  I have reviewed the data as listed    Component Value Date/Time   NA 139 08/26/2017 1049   NA 138 02/17/2017 0810   K 3.4 (L) 08/26/2017 1049   K 3.7 02/17/2017 0810   CL 102 08/26/2017 1049   CO2 28 08/26/2017 1049    CO2 27 02/17/2017 0810   GLUCOSE 130 08/26/2017 1049   GLUCOSE 92 02/17/2017 0810   BUN 21 08/26/2017 1049   BUN 16.4 02/17/2017 0810   CREATININE 1.14 (H) 08/26/2017 1049   CREATININE 1.1 02/17/2017 0810   CALCIUM 9.4 08/26/2017 1049   CALCIUM 9.3 02/17/2017 0810   PROT 5.9 (L) 08/26/2017 1049   PROT 6.4 02/17/2017 0810   ALBUMIN 3.7 08/26/2017 1049   ALBUMIN 3.9 02/17/2017 0810   AST 14 08/26/2017 1049   AST 13 02/17/2017 0810   ALT 14 08/26/2017 1049   ALT 13 02/17/2017 0810   ALKPHOS 55 08/26/2017 1049   ALKPHOS 74 02/17/2017 0810   BILITOT 0.4 08/26/2017 1049   BILITOT 0.60 02/17/2017 0810   GFRNONAA 41 (L) 08/26/2017 1049   GFRAA 48 (L) 08/26/2017 1049    No results found for: SPEP, UPEP  Lab Results  Component Value Date   WBC 13.4 (H) 09/25/2017   NEUTROABS 2.7 09/25/2017   HGB 13.0 09/25/2017   HCT 40.6 09/25/2017   MCV 86.4 09/25/2017   PLT 232 09/25/2017      Chemistry      Component Value Date/Time   NA 139 08/26/2017 1049   NA 138 02/17/2017 0810   K 3.4 (L) 08/26/2017 1049   K 3.7 02/17/2017 0810   CL 102 08/26/2017 1049   CO2 28 08/26/2017 1049   CO2 27 02/17/2017 0810   BUN 21 08/26/2017 1049   BUN 16.4 02/17/2017 0810   CREATININE 1.14 (H) 08/26/2017 1049   CREATININE 1.1 02/17/2017 0810      Component Value Date/Time   CALCIUM 9.4 08/26/2017 1049   CALCIUM 9.3 02/17/2017 0810   ALKPHOS 55 08/26/2017 1049   ALKPHOS 74 02/17/2017 0810   AST 14 08/26/2017 1049   AST 13 02/17/2017 0810   ALT 14 08/26/2017 1049   ALT 13 02/17/2017 0810   BILITOT 0.4 08/26/2017 1049   BILITOT 0.60 02/17/2017 0810     All questions were answered. The patient knows to call the clinic with any problems, questions or concerns. No barriers to learning was detected.  I spent 15 minutes counseling the patient face to face. The total time spent in the appointment was 20 minutes and more than 50% was on counseling and review of test results  Heath Lark,  MD 09/25/2017 12:13 PM

## 2017-09-25 NOTE — Telephone Encounter (Signed)
Gave avs and calendar ° °

## 2017-09-25 NOTE — Assessment & Plan Note (Signed)
Her daughter has noted that the patient sleep more than 12 hours a day I recommend consideration to stop quetiapine which is known to cause sedation Her last TSH done in January 2019 was within normal limits

## 2017-09-25 NOTE — Assessment & Plan Note (Signed)
She appears to be responding well to 10 mg of prednisone daily with improved in weight and less abdominal pain Due to significant decline in overall performance status and her memory, I recommend continue to same and I plan to see her back again in 3 months

## 2017-11-19 ENCOUNTER — Other Ambulatory Visit: Payer: Self-pay

## 2017-11-19 ENCOUNTER — Encounter (HOSPITAL_COMMUNITY): Payer: Self-pay | Admitting: Radiology

## 2017-11-19 ENCOUNTER — Emergency Department (HOSPITAL_COMMUNITY)
Admission: EM | Admit: 2017-11-19 | Discharge: 2017-11-19 | Disposition: A | Discharge status: Home / Self Care | Payer: Medicare Other | Attending: Emergency Medicine | Admitting: Emergency Medicine

## 2017-11-19 ENCOUNTER — Emergency Department (HOSPITAL_COMMUNITY): Payer: Medicare Other

## 2017-11-19 DIAGNOSIS — N179 Acute kidney failure, unspecified: Secondary | ICD-10-CM | POA: Diagnosis not present

## 2017-11-19 DIAGNOSIS — Y999 Unspecified external cause status: Secondary | ICD-10-CM | POA: Insufficient documentation

## 2017-11-19 DIAGNOSIS — I2510 Atherosclerotic heart disease of native coronary artery without angina pectoris: Secondary | ICD-10-CM | POA: Insufficient documentation

## 2017-11-19 DIAGNOSIS — W19XXXA Unspecified fall, initial encounter: Secondary | ICD-10-CM

## 2017-11-19 DIAGNOSIS — S41112A Laceration without foreign body of left upper arm, initial encounter: Secondary | ICD-10-CM | POA: Diagnosis not present

## 2017-11-19 DIAGNOSIS — Z87891 Personal history of nicotine dependence: Secondary | ICD-10-CM | POA: Insufficient documentation

## 2017-11-19 DIAGNOSIS — W01198A Fall on same level from slipping, tripping and stumbling with subsequent striking against other object, initial encounter: Secondary | ICD-10-CM | POA: Insufficient documentation

## 2017-11-19 DIAGNOSIS — Z79899 Other long term (current) drug therapy: Secondary | ICD-10-CM | POA: Insufficient documentation

## 2017-11-19 DIAGNOSIS — I10 Essential (primary) hypertension: Secondary | ICD-10-CM | POA: Insufficient documentation

## 2017-11-19 DIAGNOSIS — Y929 Unspecified place or not applicable: Secondary | ICD-10-CM | POA: Insufficient documentation

## 2017-11-19 DIAGNOSIS — Y9301 Activity, walking, marching and hiking: Secondary | ICD-10-CM | POA: Diagnosis not present

## 2017-11-19 DIAGNOSIS — Z043 Encounter for examination and observation following other accident: Secondary | ICD-10-CM | POA: Diagnosis present

## 2017-11-19 DIAGNOSIS — T148XXA Other injury of unspecified body region, initial encounter: Secondary | ICD-10-CM

## 2017-11-19 DIAGNOSIS — S0083XA Contusion of other part of head, initial encounter: Secondary | ICD-10-CM | POA: Insufficient documentation

## 2017-11-19 DIAGNOSIS — Z23 Encounter for immunization: Secondary | ICD-10-CM | POA: Diagnosis not present

## 2017-11-19 LAB — CBC WITH DIFFERENTIAL/PLATELET
Basophils Absolute: 0 10*3/uL (ref 0.0–0.1)
Basophils Relative: 0 %
EOS ABS: 0 10*3/uL (ref 0.0–0.7)
Eosinophils Relative: 0 %
HCT: 40.9 % (ref 36.0–46.0)
Hemoglobin: 13.1 g/dL (ref 12.0–15.0)
LYMPHS ABS: 3.2 10*3/uL (ref 0.7–4.0)
Lymphocytes Relative: 56 %
MCH: 27.9 pg (ref 26.0–34.0)
MCHC: 32 g/dL (ref 30.0–36.0)
MCV: 87.2 fL (ref 78.0–100.0)
MONO ABS: 0.7 10*3/uL (ref 0.1–1.0)
Monocytes Relative: 12 %
NEUTROS PCT: 32 %
Neutro Abs: 1.8 10*3/uL (ref 1.7–7.7)
PLATELETS: 231 10*3/uL (ref 150–400)
RBC: 4.69 MIL/uL (ref 3.87–5.11)
RDW: 16 % — AB (ref 11.5–15.5)
WBC: 5.7 10*3/uL (ref 4.0–10.5)

## 2017-11-19 LAB — URINALYSIS, ROUTINE W REFLEX MICROSCOPIC
BILIRUBIN URINE: NEGATIVE
Glucose, UA: NEGATIVE mg/dL
HGB URINE DIPSTICK: NEGATIVE
Ketones, ur: NEGATIVE mg/dL
Leukocytes, UA: NEGATIVE
Nitrite: NEGATIVE
PH: 7 (ref 5.0–8.0)
Protein, ur: NEGATIVE mg/dL
SPECIFIC GRAVITY, URINE: 1.009 (ref 1.005–1.030)

## 2017-11-19 LAB — COMPREHENSIVE METABOLIC PANEL
ALT: 13 U/L (ref 0–44)
AST: 17 U/L (ref 15–41)
Albumin: 3.7 g/dL (ref 3.5–5.0)
Alkaline Phosphatase: 62 U/L (ref 38–126)
Anion gap: 9 (ref 5–15)
BUN: 24 mg/dL — ABNORMAL HIGH (ref 8–23)
CHLORIDE: 104 mmol/L (ref 98–111)
CO2: 30 mmol/L (ref 22–32)
Calcium: 9 mg/dL (ref 8.9–10.3)
Creatinine, Ser: 1.54 mg/dL — ABNORMAL HIGH (ref 0.44–1.00)
GFR calc non Af Amer: 29 mL/min — ABNORMAL LOW (ref >60)
GFR, EST AFRICAN AMERICAN: 33 mL/min — AB (ref >60)
Glucose, Bld: 134 mg/dL — ABNORMAL HIGH (ref 70–99)
POTASSIUM: 4 mmol/L (ref 3.5–5.1)
SODIUM: 143 mmol/L (ref 135–145)
Total Bilirubin: 0.5 mg/dL (ref 0.3–1.2)
Total Protein: 5.9 g/dL — ABNORMAL LOW (ref 6.5–8.1)

## 2017-11-19 MED ORDER — LACTATED RINGERS IV BOLUS
1000.0000 mL | Freq: Once | INTRAVENOUS | Status: AC
  Administered 2017-11-19: 1000 mL via INTRAVENOUS

## 2017-11-19 MED ORDER — TETANUS-DIPHTH-ACELL PERTUSSIS 5-2.5-18.5 LF-MCG/0.5 IM SUSP
0.5000 mL | Freq: Once | INTRAMUSCULAR | Status: AC
  Administered 2017-11-19: 0.5 mL via INTRAMUSCULAR
  Filled 2017-11-19: qty 0.5

## 2017-11-19 NOTE — ED Notes (Signed)
Patient verbalized understanding of discharge instructions, no questions. Patient out of ED via wheelchair in no distress.

## 2017-11-19 NOTE — ED Triage Notes (Signed)
Patient here with fall from standing, hematoma to right forehead, skin tear to right arm, right shoulder pain. Takes ASA. Hx dementia.   BP: 144/76 HR: 76 RR: 16

## 2017-11-19 NOTE — ED Notes (Signed)
Bed: WA08 Expected date:  Expected time:  Means of arrival:  Comments: EMS

## 2017-11-19 NOTE — ED Provider Notes (Signed)
[facility] DEPT Provider Note   CSN: [id number] Arrival date & time: 11/19/17  1333     History   Chief Complaint Chief Complaint  Patient presents with  . Fall  . Head Injury  . Arm Injury    HPI Carla Conway is a 84 y.o. female.  84 year old female who had a mechanical fall earlier today.  She is walking along and she tripped over her pant leg on her feet tingle but fell forward into her radiator hitting her head and her right arm.  Suffering a skin tear to right arm and a large hematoma to her head.  She did not syncopized.  She denies any blood thinners.  She did not injure anything else.  She ablated after this.  She has full memory for the event.  No extremity pain, chest pain, abdominal pain, back pain.     Past Medical History:  Diagnosis Date  . Asthma   . CAD (coronary artery disease)   . Hyperlipidemia   . Hypertension     Patient Active Problem List   Diagnosis Date Noted  . Other fatigue 09/25/2017  . Malnutrition of moderate degree 07/20/2017  . Enterocolitis 07/18/2017  . Pancytopenia, acquired ([place]) 07/14/2017  . Malignant cachexia ([place]) 06/04/2017  . Hypokalemia 04/29/2017  . Left arm pain 04/29/2017  . TIA (transient ischemic attack) 04/29/2017  . Abdominal pain 04/28/2017  . Elevated CK 04/01/2017  . AKI (acute kidney injury) ([place]) 03/31/2017  . Dehydration 03/31/2017  . Generalized weakness 03/31/2017  . External hemorrhoids without complication 82/50/0370  . Port-A-Cath in place 01/06/2017  . Gastritis 01/06/2017  . Poor memory 12/11/2016  . Dysuria 11/13/2016  . Anaphylaxis 10/29/2016  . Anaphylactoid reaction 10/29/2016  . Goals of care, counseling/discussion 10/28/2016  . Chronic kidney disease, stage III (moderate) ([place]) 10/17/2016  . Lymphoma, small lymphocytic ([place]) 10/15/2016  . Neutropenia ([place]) 09/23/2016  . CLL (chronic lymphocytic leukemia) ([place]) 05/11/2016  . Acute on chronic diastolic heart  failure ([place]) 05/10/2016  . Influenza A 05/09/2016  . Lymphadenopathy of head and neck region 05/09/2016  . Lymphadenopathy, thoracic 05/09/2016  . COPD (chronic obstructive pulmonary disease) ([place]) 05/09/2016  . Debility 05/09/2016  . Syncopal episodes 05/09/2016  . Syncope 05/08/2016  . HTN (hypertension) 05/18/2013  . CAD in native artery 05/18/2013    Past Surgical History:  Procedure Laterality Date  . CATARACT EXTRACTION    . CORONARY ANGIOPLASTY  10 -15 years ago   stent in [place] ,[place]  . IR FLUORO GUIDE PORT INSERTION RIGHT  10/23/2016  . IR US GUIDE VASC ACCESS RIGHT  10/23/2016     OB History   None      Home Medications    Prior to Admission medications   Medication Sig Start Date End Date Taking? Authorizing Provider  acetaminophen (TYLENOL) 500 MG tablet Take 650 mg by mouth daily as needed for mild pain or headache.    Yes [provider]  acyclovir (ZOVIRAX) 400 MG tablet Take 1 tablet (400 mg total) by mouth daily. 06/04/17  Yes Gorsuch, Ni, MD  amLODipine (NORVASC) 2.5 MG tablet Take 2.5 mg by mouth daily. 10/26/17  Yes [provider]  aspirin EC 81 MG EC tablet Take 1 tablet (81 mg total) by mouth daily. 05/02/17  Yes Oswald Hillock, MD  isosorbide mononitrate (IMDUR) 30 MG 24 hr tablet Take 2 tablets (60 mg total) by mouth daily. Patient taking differently: Take 30 mg by mouth daily.  07/23/17  Yes Florencia Reasons, MD  lovastatin (MEVACOR) 40 MG tablet Take 40 mg by mouth at bedtime.  05/09/13  Yes [provider]  metoprolol tartrate (LOPRESSOR) 25 MG tablet Take 1 tablet (25 mg total) by mouth 2 (two) times daily. 05/01/17  Yes Lama, Marge Duncans, MD  montelukast (SINGULAIR) 10 MG tablet Take 10 mg by mouth at bedtime. 05/15/17  Yes [provider]  potassium chloride (K-DUR) 10 MEQ tablet Take 10 mEq by mouth daily. 03/24/16  Yes [provider]  predniSONE (DELTASONE) 10 MG tablet Take 10 mg by mouth daily with breakfast.   Yes  [provider]  sertraline (ZOLOFT) 50 MG tablet Take 50 mg by mouth daily.   Yes [provider]  lidocaine-prilocaine (EMLA) cream Apply 1 application topically as needed. Patient not taking: Reported on 11/19/2017 10/27/16   Heath Lark, MD  omeprazole (PRILOSEC) 20 MG capsule Take 1 capsule (20 mg total) by mouth daily. Patient not taking: Reported on 11/19/2017 01/06/17   Heath Lark, MD  ondansetron (ZOFRAN) 8 MG tablet Take 1 tablet (8 mg total) by mouth 2 (two) times daily as needed for refractory nausea / vomiting. Start on day 3 after chemotherapy. Patient not taking: Reported on 11/19/2017 06/04/17   Heath Lark, MD  predniSONE (DELTASONE) 20 MG tablet TAKE 20 MG BY MOUTH EVERY OTHER DAY WITH BREAKFAST ALTERNATING WITH 10 MG EVERY OTHER DAY Patient not taking: Reported on 11/19/2017 09/16/17   Heath Lark, MD  prochlorperazine (COMPAZINE) 10 MG tablet Take 1 tablet (10 mg total) by mouth every 6 (six) hours as needed (Nausea or vomiting). Patient not taking: Reported on 11/19/2017 06/04/17   Heath Lark, MD    Family History Family History  Problem Relation Age of Onset  . Cancer Father        colon    Social History Social History   Tobacco Use  . Smoking status: Former Smoker    Types: Cigarettes    Start date: 05/18/1983  . Smokeless tobacco: Never Used  Substance Use Topics  . Alcohol use: No  . Drug use: No     Allergies   Rituximab   Review of Systems Review of Systems  All other systems reviewed and are negative.    Physical Exam Updated Vital Signs BP (!) 132/102 (BP Location: Left Arm)   Pulse 95   Temp 97.7 F (36.5 C) (Oral)   Resp 18   Ht 5' 6"  (1.676 m)   Wt 61.2 kg   SpO2 93%   BMI 21.79 kg/m   Physical Exam  Constitutional: She is oriented to person, place, and time. She appears well-developed and well-nourished.  HENT:  Head: Normocephalic.  Large hematoma over the right side of her forehead.  Eyes: Conjunctivae and EOM  are normal.  Neck: Normal range of motion.  Cardiovascular: Normal rate and regular rhythm.  Pulmonary/Chest: Effort normal and breath sounds normal. No stridor. No respiratory distress.  Abdominal: Soft. Bowel sounds are normal. She exhibits no distension.  Neurological: She is alert and oriented to person, place, and time. No cranial nerve deficit. Coordination normal.  Skin: Skin is warm and dry.  Skin tear to right arm approximately 2 cm x 2 cm triangular shaped.  Nursing note and vitals reviewed.    ED Treatments / Results  Labs (all labs ordered are listed, but only abnormal results are displayed) Labs Reviewed  CBC WITH DIFFERENTIAL/PLATELET - Abnormal; Notable for the following components:  Result Value   RDW 16.0 (*)    All other components within normal limits  COMPREHENSIVE METABOLIC PANEL - Abnormal; Notable for the following components:   Glucose, Bld 134 (*)    BUN 24 (*)    Creatinine, Ser 1.54 (*)    Total Protein 5.9 (*)    GFR calc non Af Amer 29 (*)    GFR calc Af Amer 33 (*)    All other components within normal limits  URINALYSIS, ROUTINE W REFLEX MICROSCOPIC    EKG None  Radiology Dg Chest 2 View  Result Date: 11/19/2017 CLINICAL DATA:  84 year old female status post fall with right hip pain. EXAM: CHEST - 2 VIEW COMPARISON:  Chest radiographs 07/21/2017 and earlier. FINDINGS: Semi upright AP and lateral views of the chest. Lower lung volumes. Calcified aortic atherosclerosis. Other mediastinal contours are within normal limits. Stable right chest power port. Chronic linear atelectasis or scarring along the right minor fissure. No pneumothorax, pulmonary edema, pleural effusion, or acute pulmonary opacity. Stable visualized osseous structures. Chronic upper thoracic compression fracture. Negative visible bowel gas pattern. IMPRESSION: 1. Lower lung volumes, no acute cardiopulmonary abnormality. 2.  Aortic Atherosclerosis (ICD10-I70.0). Electronically  Signed   By: Genevie Ann M.D.   On: 11/19/2017 14:54   Dg Pelvis 1-2 Views  Result Date: 11/19/2017 CLINICAL DATA:  Fall, right hip pain EXAM: PELVIS - 1-2 VIEW COMPARISON:  05/08/2016 FINDINGS: Mild symmetric degenerative changes in the hips with joint space narrowing and early spurring. SI joints are symmetric and unremarkable. No acute bony abnormality. Specifically, no fracture, subluxation, or dislocation. Degenerative changes in the visualized lower lumbar spine. IMPRESSION: No acute bony abnormality. Electronically Signed   By: Rolm Baptise M.D.   On: 11/19/2017 14:52   Ct Head Wo Contrast  Result Date: 11/19/2017 CLINICAL DATA:  Patient here with fall from standing, hematoma to right forehead, skin tear to right arm, right shoulder pain. EXAM: CT HEAD WITHOUT CONTRAST CT CERVICAL SPINE WITHOUT CONTRAST TECHNIQUE: Multidetector CT imaging of the head and cervical spine was performed following the standard protocol without intravenous contrast. Multiplanar CT image reconstructions of the cervical spine were also generated. COMPARISON:  None. FINDINGS: CT HEAD FINDINGS Brain: No evidence of acute infarction, hemorrhage, extra-axial collection, ventriculomegaly, or mass effect. Generalized cerebral atrophy. Periventricular white matter low attenuation likely secondary to microangiopathy. Vascular: Cerebrovascular atherosclerotic calcifications are noted. Skull: Negative for fracture or focal lesion. Sinuses/Orbits: Visualized portions of the orbits are unremarkable. Visualized portions of the paranasal sinuses and mastoid air cells are unremarkable. Other: Right frontal scalp hematoma. CT CERVICAL SPINE FINDINGS Alignment: 2 mm anterolisthesis of C2 on C3. Skull base and vertebrae: No acute fracture. No primary bone lesion or focal pathologic process. Soft tissues and spinal canal: No prevertebral fluid or swelling. No visible canal hematoma. Disc levels: Degenerative disc disease with disc height loss at  C3-4, C4-5, C5-6 and C6-7. moderate left facet arthropathy at C2-3. Moderate bilateral facet arthropathy at C3-4. Bilateral uncovertebral degenerative changes at C3-4 with right foraminal stenosis. Bilateral uncovertebral degenerative changes at C4-5. Bilateral uncovertebral degenerative changes at C5-6 with bilateral foraminal stenosis. Bilateral uncovertebral degenerative changes at C6-7 with right foraminal stenosis. Upper chest: Lung apices are clear. Other: No fluid collection or hematoma. Bilateral carotid artery atherosclerosis. IMPRESSION: . 1. No acute intracranial pathology. 2. No acute osseous injury of the cervical spine. 3. Right frontal scalp hematoma. Electronically Signed   By: Kathreen Devoid   On: 11/19/2017 15:04   Ct Cervical  Spine Wo Contrast  Result Date: 11/19/2017 CLINICAL DATA:  Patient here with fall from standing, hematoma to right forehead, skin tear to right arm, right shoulder pain. EXAM: CT HEAD WITHOUT CONTRAST CT CERVICAL SPINE WITHOUT CONTRAST TECHNIQUE: Multidetector CT imaging of the head and cervical spine was performed following the standard protocol without intravenous contrast. Multiplanar CT image reconstructions of the cervical spine were also generated. COMPARISON:  None. FINDINGS: CT HEAD FINDINGS Brain: No evidence of acute infarction, hemorrhage, extra-axial collection, ventriculomegaly, or mass effect. Generalized cerebral atrophy. Periventricular white matter low attenuation likely secondary to microangiopathy. Vascular: Cerebrovascular atherosclerotic calcifications are noted. Skull: Negative for fracture or focal lesion. Sinuses/Orbits: Visualized portions of the orbits are unremarkable. Visualized portions of the paranasal sinuses and mastoid air cells are unremarkable. Other: Right frontal scalp hematoma. CT CERVICAL SPINE FINDINGS Alignment: 2 mm anterolisthesis of C2 on C3. Skull base and vertebrae: No acute fracture. No primary bone lesion or focal pathologic  process. Soft tissues and spinal canal: No prevertebral fluid or swelling. No visible canal hematoma. Disc levels: Degenerative disc disease with disc height loss at C3-4, C4-5, C5-6 and C6-7. moderate left facet arthropathy at C2-3. Moderate bilateral facet arthropathy at C3-4. Bilateral uncovertebral degenerative changes at C3-4 with right foraminal stenosis. Bilateral uncovertebral degenerative changes at C4-5. Bilateral uncovertebral degenerative changes at C5-6 with bilateral foraminal stenosis. Bilateral uncovertebral degenerative changes at C6-7 with right foraminal stenosis. Upper chest: Lung apices are clear. Other: No fluid collection or hematoma. Bilateral carotid artery atherosclerosis. IMPRESSION: . 1. No acute intracranial pathology. 2. No acute osseous injury of the cervical spine. 3. Right frontal scalp hematoma. Electronically Signed   By: Kathreen Devoid   On: 11/19/2017 15:04   Dg Humerus Right  Result Date: 11/19/2017 CLINICAL DATA:  Right upper arm pain due to a fall today. Initial encounter. EXAM: RIGHT HUMERUS - 2+ VIEW COMPARISON:  None. FINDINGS: There is no evidence of fracture or other focal bone lesions. Soft tissues are unremarkable. IMPRESSION: Negative exam. Electronically Signed   By: Inge Rise M.D.   On: 11/19/2017 14:52    Procedures Procedures (including critical care time)  Medications Ordered in ED Medications  Tdap (BOOSTRIX) injection 0.5 mL (has no administration in time range)  lactated ringers bolus 1,000 mL (has no administration in time range)     Initial Impression / Assessment and Plan / ED Course  I have reviewed the triage vital signs and the nursing notes.  Pertinent labs & imaging results that were available during my care of the patient were reviewed by me and considered in my medical decision making (see chart for details).  Skin tear can be fixed with Steri-Strips we will asked the nurse to do this.  Not on any blood thinners and CT  negative can use ice and pain medication from that standpoint.  Does have a little bit of acute kidney injury and waiting a urine to make sure that no weakness contributed to this.  Liter of fluids given.  Transfer of care to Dr. Lacinda Axon pending urinalysis and fluid administration with ambulation. Suspect patient ok for discharge.   Final Clinical Impressions(s) / ED Diagnoses   Final diagnoses:  AKI (acute kidney injury) ([place])  Hematoma  Fall, initial encounter  Skin tear of left upper arm without complication, initial encounter    ED Discharge Orders    None       Tecumseh Yeagley, Corene Cornea, MD 11/19/17 [phone]

## 2017-11-19 NOTE — ED Notes (Signed)
Patient ambulatory to bathroom.

## 2017-11-19 NOTE — Discharge Instructions (Addendum)
Your creatinine which is a measure of kidney function was slightly elevated.  This will need to be rechecked in 2 to 3 weeks.  Follow-up with your primary care doctor.

## 2017-11-21 ENCOUNTER — Emergency Department (HOSPITAL_COMMUNITY): Payer: Medicare Other

## 2017-11-21 ENCOUNTER — Observation Stay (HOSPITAL_COMMUNITY)
Admission: EM | Admit: 2017-11-21 | Discharge: 2017-11-23 | Disposition: A | Discharge status: Home / Self Care | Payer: Medicare Other | Attending: Family Medicine | Admitting: Family Medicine

## 2017-11-21 ENCOUNTER — Encounter (HOSPITAL_COMMUNITY): Payer: Self-pay | Admitting: *Deleted

## 2017-11-21 ENCOUNTER — Other Ambulatory Visit: Payer: Self-pay

## 2017-11-21 DIAGNOSIS — I129 Hypertensive chronic kidney disease with stage 1 through stage 4 chronic kidney disease, or unspecified chronic kidney disease: Secondary | ICD-10-CM | POA: Diagnosis not present

## 2017-11-21 DIAGNOSIS — C9110 Chronic lymphocytic leukemia of B-cell type not having achieved remission: Secondary | ICD-10-CM | POA: Diagnosis not present

## 2017-11-21 DIAGNOSIS — J45909 Unspecified asthma, uncomplicated: Secondary | ICD-10-CM | POA: Diagnosis not present

## 2017-11-21 DIAGNOSIS — I10 Essential (primary) hypertension: Secondary | ICD-10-CM | POA: Diagnosis present

## 2017-11-21 DIAGNOSIS — Z8673 Personal history of transient ischemic attack (TIA), and cerebral infarction without residual deficits: Secondary | ICD-10-CM | POA: Diagnosis not present

## 2017-11-21 DIAGNOSIS — G459 Transient cerebral ischemic attack, unspecified: Secondary | ICD-10-CM | POA: Diagnosis not present

## 2017-11-21 DIAGNOSIS — Z79899 Other long term (current) drug therapy: Secondary | ICD-10-CM | POA: Insufficient documentation

## 2017-11-21 DIAGNOSIS — N183 Chronic kidney disease, stage 3 unspecified: Secondary | ICD-10-CM | POA: Diagnosis present

## 2017-11-21 DIAGNOSIS — I2510 Atherosclerotic heart disease of native coronary artery without angina pectoris: Secondary | ICD-10-CM | POA: Diagnosis present

## 2017-11-21 DIAGNOSIS — Z87891 Personal history of nicotine dependence: Secondary | ICD-10-CM | POA: Insufficient documentation

## 2017-11-21 DIAGNOSIS — Z7982 Long term (current) use of aspirin: Secondary | ICD-10-CM | POA: Diagnosis not present

## 2017-11-21 DIAGNOSIS — C9190 Lymphoid leukemia, unspecified not having achieved remission: Secondary | ICD-10-CM | POA: Diagnosis not present

## 2017-11-21 DIAGNOSIS — R55 Syncope and collapse: Secondary | ICD-10-CM | POA: Diagnosis not present

## 2017-11-21 DIAGNOSIS — J449 Chronic obstructive pulmonary disease, unspecified: Secondary | ICD-10-CM | POA: Diagnosis not present

## 2017-11-21 HISTORY — DX: Repeated falls: R29.6

## 2017-11-21 LAB — CBC WITH DIFFERENTIAL/PLATELET
BAND NEUTROPHILS: 2 %
Basophils Absolute: 0 10*3/uL (ref 0.0–0.1)
Basophils Relative: 0 %
Blasts: 0 %
EOS ABS: 0 10*3/uL (ref 0.0–0.7)
Eosinophils Relative: 0 %
HEMATOCRIT: 40.3 % (ref 36.0–46.0)
HEMOGLOBIN: 12.3 g/dL (ref 12.0–15.0)
Lymphocytes Relative: 55 %
Lymphs Abs: 3.1 10*3/uL (ref 0.7–4.0)
MCH: 27.2 pg (ref 26.0–34.0)
MCHC: 30.5 g/dL (ref 30.0–36.0)
MCV: 89.2 fL (ref 78.0–100.0)
METAMYELOCYTES PCT: 0 %
MONO ABS: 0.1 10*3/uL (ref 0.1–1.0)
MYELOCYTES: 0 %
Monocytes Relative: 1 %
Neutro Abs: 2.6 10*3/uL (ref 1.7–7.7)
Neutrophils Relative %: 42 %
OTHER: 0 %
PROMYELOCYTES RELATIVE: 0 %
Platelets: 200 10*3/uL (ref 150–400)
RBC: 4.52 MIL/uL (ref 3.87–5.11)
RDW: 15.9 % — AB (ref 11.5–15.5)
WBC: 5.8 10*3/uL (ref 4.0–10.5)
nRBC: 0 /100 WBC

## 2017-11-21 LAB — CBG MONITORING, ED
GLUCOSE-CAPILLARY: 176 mg/dL — AB (ref 70–99)
GLUCOSE-CAPILLARY: 113 mg/dL — AB (ref 70–99)

## 2017-11-21 LAB — GLUCOSE, CAPILLARY: GLUCOSE-CAPILLARY: 186 mg/dL — AB (ref 70–99)

## 2017-11-21 LAB — BASIC METABOLIC PANEL
Anion gap: 8 (ref 5–15)
BUN: 21 mg/dL (ref 8–23)
CALCIUM: 9 mg/dL (ref 8.9–10.3)
CO2: 28 mmol/L (ref 22–32)
CREATININE: 1.37 mg/dL — AB (ref 0.44–1.00)
Chloride: 104 mmol/L (ref 98–111)
GFR calc Af Amer: 38 mL/min — ABNORMAL LOW (ref >60)
GFR calc non Af Amer: 33 mL/min — ABNORMAL LOW (ref >60)
GLUCOSE: 130 mg/dL — AB (ref 70–99)
Potassium: 4 mmol/L (ref 3.5–5.1)
Sodium: 140 mmol/L (ref 135–145)

## 2017-11-21 LAB — TROPONIN I: Troponin I: <0.03 ng/mL (ref <0.03)

## 2017-11-21 LAB — HEMOGLOBIN A1C
HEMOGLOBIN A1C: 6 % — AB (ref 4.8–5.6)
Mean Plasma Glucose: 125.5 mg/dL

## 2017-11-21 LAB — TSH: TSH: 1.839 u[IU]/mL (ref 0.350–4.500)

## 2017-11-21 MED ORDER — SODIUM CHLORIDE 0.9 % IV BOLUS
500.0000 mL | Freq: Once | INTRAVENOUS | Status: AC
  Administered 2017-11-21: 500 mL via INTRAVENOUS

## 2017-11-21 MED ORDER — ONDANSETRON HCL 4 MG PO TABS: 4.0000 mg | ORAL_TABLET | Freq: Four times a day (QID) | ORAL | Status: DC | PRN

## 2017-11-21 MED ORDER — MONTELUKAST SODIUM 10 MG PO TABS
10.0000 mg | ORAL_TABLET | Freq: Every day | ORAL | Status: DC
  Administered 2017-11-21 – 2017-11-22 (×2): 10 mg via ORAL
  Filled 2017-11-21 (×3): qty 1

## 2017-11-21 MED ORDER — SODIUM CHLORIDE 0.9 % IV SOLN
INTRAVENOUS | Status: AC
  Administered 2017-11-21 – 2017-11-22 (×2): via INTRAVENOUS

## 2017-11-21 MED ORDER — SENNOSIDES-DOCUSATE SODIUM 8.6-50 MG PO TABS: 1.0000 | ORAL_TABLET | Freq: Every evening | ORAL | Status: DC | PRN

## 2017-11-21 MED ORDER — ASPIRIN EC 81 MG PO TBEC
81.0000 mg | DELAYED_RELEASE_TABLET | Freq: Every day | ORAL | Status: DC
  Administered 2017-11-22 – 2017-11-23 (×2): 81 mg via ORAL
  Filled 2017-11-21 (×2): qty 1

## 2017-11-21 MED ORDER — POTASSIUM CHLORIDE CRYS ER 10 MEQ PO TBCR
10.0000 meq | EXTENDED_RELEASE_TABLET | Freq: Every day | ORAL | Status: DC
  Administered 2017-11-22 – 2017-11-23 (×2): 10 meq via ORAL
  Filled 2017-11-21 (×4): qty 1

## 2017-11-21 MED ORDER — INSULIN ASPART 100 UNIT/ML ~~LOC~~ SOLN: 0.0000 [IU] | Freq: Three times a day (TID) | SUBCUTANEOUS | Status: DC

## 2017-11-21 MED ORDER — ENOXAPARIN SODIUM 40 MG/0.4ML ~~LOC~~ SOLN: 40.0000 mg | SUBCUTANEOUS | Status: DC

## 2017-11-21 MED ORDER — SERTRALINE HCL 50 MG PO TABS
50.0000 mg | ORAL_TABLET | Freq: Every day | ORAL | Status: DC
  Administered 2017-11-22 – 2017-11-23 (×2): 50 mg via ORAL
  Filled 2017-11-21 (×2): qty 1

## 2017-11-21 MED ORDER — ONDANSETRON HCL 4 MG/2ML IJ SOLN: 4.0000 mg | Freq: Four times a day (QID) | INTRAMUSCULAR | Status: DC | PRN

## 2017-11-21 MED ORDER — ACETAMINOPHEN 650 MG RE SUPP: 650.0000 mg | Freq: Four times a day (QID) | RECTAL | Status: DC | PRN

## 2017-11-21 MED ORDER — ACYCLOVIR 400 MG PO TABS
400.0000 mg | ORAL_TABLET | Freq: Every day | ORAL | Status: DC
  Administered 2017-11-22 – 2017-11-23 (×2): 400 mg via ORAL
  Filled 2017-11-21 (×2): qty 1

## 2017-11-21 MED ORDER — PREDNISONE 10 MG PO TABS
10.0000 mg | ORAL_TABLET | Freq: Every day | ORAL | Status: DC
  Administered 2017-11-22 – 2017-11-23 (×2): 10 mg via ORAL
  Filled 2017-11-21 (×2): qty 1

## 2017-11-21 MED ORDER — METOPROLOL TARTRATE 25 MG PO TABS
25.0000 mg | ORAL_TABLET | Freq: Two times a day (BID) | ORAL | Status: DC
  Administered 2017-11-21 – 2017-11-23 (×4): 25 mg via ORAL
  Filled 2017-11-21 (×4): qty 1

## 2017-11-21 MED ORDER — ACETAMINOPHEN 325 MG PO TABS
650.0000 mg | ORAL_TABLET | Freq: Four times a day (QID) | ORAL | Status: DC | PRN
Start: 2017-11-21 — End: 2017-11-23
  Administered 2017-11-22: 650 mg via ORAL
  Filled 2017-11-21: qty 2

## 2017-11-21 NOTE — ED Provider Notes (Signed)
[facility] EMERGENCY DEPARTMENT Provider Note   CSN: [id number] Arrival date & time: 11/21/17  1320     History   Chief Complaint Chief Complaint  Patient presents with  . Loss of Consciousness    HPI Carla Conway is a 84 y.o. female.  HPI 84 year old female with history of hypertension, hyperlipidemia, CAD comes in with chief complaint of syncope. She also has hx of CLL.  According to EMS patient had a witnessed syncope while she was having lunch.  Patient recalls the incident by stating that she was having lunch, and all of a sudden she passed out.  Patient did not have any chest pain, shortness of breath, palpitations or any other prodromal symptoms before she fainted.  According to the nursing home patient also appears to be more confused to them than baseline, and she has been asking same questions repeatedly.  Patient did have a fall few days ago as well -patient reports that that fall was because of her tripping.  Past Medical History:  Diagnosis Date  . Asthma   . CAD (coronary artery disease)   . Falls frequently   . Hyperlipidemia   . Hypertension     Patient Active Problem List   Diagnosis Date Noted  . Other fatigue 09/25/2017  . Malnutrition of moderate degree 07/20/2017  . Enterocolitis 07/18/2017  . Pancytopenia, acquired ([place]) 07/14/2017  . Malignant cachexia ([place]) 06/04/2017  . Hypokalemia 04/29/2017  . Left arm pain 04/29/2017  . TIA (transient ischemic attack) 04/29/2017  . Abdominal pain 04/28/2017  . Elevated CK 04/01/2017  . AKI (acute kidney injury) ([place]) 03/31/2017  . Dehydration 03/31/2017  . Generalized weakness 03/31/2017  . External hemorrhoids without complication 56/31/4970  . Port-A-Cath in place 01/06/2017  . Gastritis 01/06/2017  . Poor memory 12/11/2016  . Dysuria 11/13/2016  . Anaphylaxis 10/29/2016  . Anaphylactoid reaction 10/29/2016  . Goals of care, counseling/discussion 10/28/2016  . Chronic kidney  disease, stage III (moderate) ([place]) 10/17/2016  . 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 Neutropenia ([place]) 09/23/2016  . CLL (chronic lymphocytic leukemia) ([place]) 05/11/2016  . Acute on chronic diastolic heart failure ([place]) 05/10/2016  . Influenza A 05/09/2016  . Lymphadenopathy of head and neck region 05/09/2016  . Lymphadenopathy, thoracic 05/09/2016  . COPD (chronic obstructive pulmonary disease) ([place]) 05/09/2016  . Debility 05/09/2016  . Syncopal episodes 05/09/2016  . Syncope 05/08/2016  . HTN (hypertension) 05/18/2013  . CAD in native artery 05/18/2013    Past Surgical History:  Procedure Laterality Date  . CATARACT EXTRACTION    . CORONARY ANGIOPLASTY  10 -15 years ago   stent in [place] ,[place]  . IR FLUORO GUIDE PORT INSERTION RIGHT  10/23/2016  . IR US GUIDE VASC ACCESS RIGHT  10/23/2016     OB History   None      Home Medications    Prior to Admission medications   Medication Sig Start Date End Date Taking? Authorizing Provider  acetaminophen (TYLENOL) 500 MG tablet Take 650 mg by mouth daily as needed for mild pain or headache.     [provider]  acyclovir (ZOVIRAX) 400 MG tablet Take 1 tablet (400 mg total) by mouth daily. 06/04/17   Heath Lark, MD  amLODipine (NORVASC) 2.5 MG tablet Take 2.5 mg by mouth daily. 10/26/17   [provider]  aspirin EC 81 MG EC tablet Take 1 tablet (81 mg total) by mouth daily. 05/02/17   Oswald Hillock, MD  isosorbide  mononitrate (IMDUR) 30 MG 24 hr tablet Take 2 tablets (60 mg total) by mouth daily. Patient taking differently: Take 30 mg by mouth daily.  07/23/17   Florencia Reasons, MD  lidocaine-prilocaine (EMLA) cream Apply 1 application topically as needed. Patient not taking: Reported on 11/19/2017 10/27/16   Heath Lark, MD  lovastatin (MEVACOR) 40 MG tablet Take 40 mg by mouth at bedtime.  05/09/13   [provider]  metoprolol tartrate (LOPRESSOR) 25 MG tablet Take 1 tablet (25 mg total) by mouth 2  (two) times daily. 05/01/17   Oswald Hillock, MD  montelukast (SINGULAIR) 10 MG tablet Take 10 mg by mouth at bedtime. 05/15/17   [provider]  omeprazole (PRILOSEC) 20 MG capsule Take 1 capsule (20 mg total) by mouth daily. Patient not taking: Reported on 11/19/2017 01/06/17   Heath Lark, MD  ondansetron (ZOFRAN) 8 MG tablet Take 1 tablet (8 mg total) by mouth 2 (two) times daily as needed for refractory nausea / vomiting. Start on day 3 after chemotherapy. Patient not taking: Reported on 11/19/2017 06/04/17   Heath Lark, MD  potassium chloride (K-DUR) 10 MEQ tablet Take 10 mEq by mouth daily. 03/24/16   [provider]  predniSONE (DELTASONE) 10 MG tablet Take 10 mg by mouth daily with breakfast.    [provider]  predniSONE (DELTASONE) 20 MG tablet TAKE 20 MG BY MOUTH EVERY OTHER DAY WITH BREAKFAST ALTERNATING WITH 10 MG EVERY OTHER DAY Patient not taking: Reported on 11/19/2017 09/16/17   Heath Lark, MD  prochlorperazine (COMPAZINE) 10 MG tablet Take 1 tablet (10 mg total) by mouth every 6 (six) hours as needed (Nausea or vomiting). Patient not taking: Reported on 11/19/2017 06/04/17   Heath Lark, MD  sertraline (ZOLOFT) 50 MG tablet Take 50 mg by mouth daily.    [provider]    Family History Family History  Problem Relation Age of Onset  . Cancer Father        colon    Social History Social History   Tobacco Use  . Smoking status: Former Smoker    Types: Cigarettes    Start date: 05/18/1983  . Smokeless tobacco: Never Used  Substance Use Topics  . Alcohol use: No  . Drug use: No     Allergies   Rituximab   Review of Systems Review of Systems  Constitutional: Positive for activity change.  Respiratory: Negative for shortness of breath.   Cardiovascular: Negative for chest pain and palpitations.  Skin: Positive for rash and wound.  Neurological: Positive for syncope.  Hematological: Does not bruise/bleed easily.  All other systems  reviewed and are negative.    Physical Exam Updated Vital Signs BP 129/61 (BP Location: Left Arm)   Pulse 73   Temp 98.2 F (36.8 C) (Oral)   Resp 17   Ht 5' 6"  (1.676 m)   SpO2 99%   BMI 21.79 kg/m   Physical Exam  Constitutional: She is oriented to person, place, and time. She appears well-developed.  HENT:  Head: Normocephalic and atraumatic.  Eyes: Pupils are equal, round, and reactive to light. EOM are normal.  Neck: Normal range of motion. Neck supple.  Cardiovascular: Normal rate.  Murmur heard. Pulmonary/Chest: Effort normal.  Abdominal: Bowel sounds are normal.  Musculoskeletal: She exhibits no edema or tenderness.  Head to toe evaluation shows no hematoma, bleeding of the scalp, no facial abrasions, no spine step offs, crepitus of the chest or neck, no tenderness to palpation of  the bilateral upper and lower extremities, no gross deformities, no chest tenderness, no pelvic pain.   Neurological: She is alert and oriented to person, place, and time. No cranial nerve deficit. Coordination normal.  Cerebellar exam is normal (finger to nose) Sensory exam normal for bilateral upper and lower extremities - and patient is able to discriminate between sharp and dull. Motor exam is 4+/5   Skin: Skin is warm and dry. Rash noted.  Nursing note and vitals reviewed.   ED Treatments / Results  Labs (all labs ordered are listed, but only abnormal results are displayed) Labs Reviewed  CBG MONITORING, ED - Abnormal; Notable for the following components:      Result Value   Glucose-Capillary 176 (*)    All other components within normal limits  BASIC METABOLIC PANEL  CBC WITH DIFFERENTIAL/PLATELET    EKG EKG Interpretation  Date/Time:  Saturday November 21 2017 13:26:37 EDT Ventricular Rate:  76 PR Interval:    QRS Duration: 88 QT Interval:  359 QTC Calculation: 404 R Axis:   31 Text Interpretation:  Sinus rhythm Multiple premature complexes, vent & supraven RSR' in  V1 or V2, right VCD or RVH Nonspecific T abnormalities, lateral leads No acute changes Confirmed by Varney Biles [phone]) on 11/21/2017 2:11:55 PM   Radiology Dg Shoulder Right  Result Date: 11/21/2017 CLINICAL DATA:  Recent fall with pain EXAM: RIGHT SHOULDER - 2+ VIEW COMPARISON:  None. FINDINGS: Frontal and Y scapular images were obtained. No acute fracture or dislocation. There is marked narrowing of the glenohumeral joint with subchondral sclerosis in this area. 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 There is only slight osteoarthritic change in the acromioclavicular joint. No erosive change. Port-A-Cath tip is in the superior vena cava. Visualized right lung clear. IMPRESSION: Advanced osteoarthritic change glenohumeral joint. Slight osteoarthritic change acromioclavicular joint. Synovial chondromatosis medially. No acute fracture or dislocation. Electronically Signed   By: Lowella Grip III M.D.   On: 11/21/2017 15:24   Ct Head Wo Contrast  Result Date: 11/21/2017 CLINICAL DATA:  Syncopal episode. Fall 2 days ago. Injury to right side of head. EXAM: CT HEAD WITHOUT CONTRAST CT CERVICAL SPINE WITHOUT CONTRAST TECHNIQUE: Multidetector CT imaging of the head and cervical spine was performed following the standard protocol without intravenous contrast. Multiplanar CT image reconstructions of the cervical spine were also generated. COMPARISON:  11/19/2017 FINDINGS: CT HEAD FINDINGS Brain: Ventricles, cisterns and other CSF spaces are normal. There is no mass, mass effect, shift of midline structures or acute hemorrhage. Moderate chronic ischemic microvascular disease. No acute infarction. Vascular: No hyperdense vessel or unexpected calcification. Skull: Normal. Negative for fracture or focal lesion. Sinuses/Orbits: Orbits are within normal. Findings suggesting previous sinus surgery with fenestration of the medial wall of the maxillary  sinuses. Minimal mucosal membrane thickening involving the right maxillary sinus. Other: None. CT CERVICAL SPINE FINDINGS Alignment: 2 mm anterior subluxation of C2 and C3 unchanged and likely degenerative. Skull base and vertebrae: Vertebral body heights are maintained. There is moderate spondylosis of the cervical spine. Atlantoaxial articulation is unremarkable. Moderate uncovertebral joint spurring and facet arthropathy. No acute fracture or subluxation. Moderate bilateral neural foraminal narrowing at multiple levels due to adjacent bony spurring. Soft tissues and spinal canal: No prevertebral fluid or swelling. No visible canal hematoma. Disc levels: Moderate disc space narrowing at the C3-4, C4-5, C5-6 and C6-7 levels. Upper chest: No acute findings. Other: None. IMPRESSION: No acute brain injury. Moderate  chronic ischemic microvascular disease. No acute cervical spine injury. Moderate spondylosis of the cervical spine with moderate multilevel disc disease and moderate bilateral neural foraminal narrowing due to adjacent bony spurring. Minimal chronic sinus inflammatory change. Evidence suggesting previous sinus surgery. Electronically Signed   By: Marin Olp M.D.   On: 11/21/2017 15:16   Ct Cervical Spine Wo Contrast  Result Date: 11/21/2017 CLINICAL DATA:  Syncopal episode. Fall 2 days ago. Injury to right side of head. EXAM: CT HEAD WITHOUT CONTRAST CT CERVICAL SPINE WITHOUT CONTRAST TECHNIQUE: Multidetector CT imaging of the head and cervical spine was performed following the standard protocol without intravenous contrast. Multiplanar CT image reconstructions of the cervical spine were also generated. COMPARISON:  11/19/2017 FINDINGS: CT HEAD FINDINGS Brain: Ventricles, cisterns and other CSF spaces are normal. There is no mass, mass effect, shift of midline structures or acute hemorrhage. Moderate chronic ischemic microvascular disease. No acute infarction. Vascular: No hyperdense vessel or  unexpected calcification. Skull: Normal. Negative for fracture or focal lesion. Sinuses/Orbits: Orbits are within normal. Findings suggesting previous sinus surgery with fenestration of the medial wall of the maxillary sinuses. Minimal mucosal membrane thickening involving the right maxillary sinus. Other: None. CT CERVICAL SPINE FINDINGS Alignment: 2 mm anterior subluxation of C2 and C3 unchanged and likely degenerative. Skull base and vertebrae: Vertebral body heights are maintained. There is moderate spondylosis of the cervical spine. Atlantoaxial articulation is unremarkable. Moderate uncovertebral joint spurring and facet arthropathy. No acute fracture or subluxation. Moderate bilateral neural foraminal narrowing at multiple levels due to adjacent bony spurring. Soft tissues and spinal canal: No prevertebral fluid or swelling. No visible canal hematoma. Disc levels: Moderate disc space narrowing at the C3-4, C4-5, C5-6 and C6-7 levels. Upper chest: No acute findings. Other: None. IMPRESSION: No acute brain injury. Moderate chronic ischemic microvascular disease. No acute cervical spine injury. Moderate spondylosis of the cervical spine with moderate multilevel disc disease and moderate bilateral neural foraminal narrowing due to adjacent bony spurring. Minimal chronic sinus inflammatory change. Evidence suggesting previous sinus surgery. Electronically Signed   By: Marin Olp M.D.   On: 11/21/2017 15:16    Procedures Procedures (including critical care time)   Medications Ordered in ED Medications  sodium chloride 0.9 % bolus 500 mL (has no administration in time range)     Initial Impression / Assessment and Plan / ED Course  I have reviewed the triage vital signs and the nursing notes.  Pertinent labs & imaging results that were available during my care of the patient were reviewed by me and considered in my medical decision making (see chart for details).     DDx includes: Orthostatic  hypotension Stroke / TIA Vertebral artery dissection/stenosis Dysrhythmia PE Vasovagal/neurocardiogenic syncope Aortic stenosis Valvular disorder/Cardiomyopathy  Patient comes in after she had a syncopal episode that was witnessed at her nursing home.  Patient had no prodrome, she is 77 and has CAD history.  We will admit patient for syncope.  Additionally, the nursing home reports that patient has been more confused than baseline, asking the same questions repeatedly.  During my evaluation patient had no focal neuro deficits and she does not sound aphasic.  Questionable TIA.  While patient is admitted she can also undergo TIA work-up with the admitting team feels that is necessary.  Final Clinical Impressions(s) / ED Diagnoses   Final diagnoses:  Syncope and collapse  TIA (transient ischemic attack)    ED Discharge Orders    None  Varney Biles, MD 11/21/17 1540

## 2017-11-21 NOTE — H&P (Signed)
History and Physical    Carla Conway Pra KPQ:[id number] DOB: March 28, 1927 DOA: 11/21/2017  PCP: Lujean Amel, MD Patient coming from: Independent living  Chief Complaint: Syncope  HPI: Carla Conway is a 84 y.o. female with medical history significant of coronary artery disease, hypertension, hyperlipidemia, CLL comes to the hospital after having an episode of syncope.  Patient states this morning she was feeling well and went downstairs to go eat breakfast and soon after she started feeling slightly dizzy and syncopized.  She thinks she passed out for about 2 minutes but when she regained consciousness she denies any postictal symptoms/headache/confusion.  She was sent to the hospital for further evaluation. Upon further questioning patient states this also happened to her a couple of days ago but was nowhere as severe as this therefore did not seek medical attention.  In the ER patient's labs were unremarkable.  Her vital signs were unremarkable as well.  Due to her concerning symptoms and second episode at home we will admit her for observation and further work-up.   Review of Systems: As per HPI otherwise 10 point review of systems negative.  Review of Systems Otherwise negative except as per HPI, including: General: Denies fever, chills, night sweats or unintended weight loss. Resp: Denies cough, wheezing, shortness of breath. Cardiac: Denies chest pain, palpitations, orthopnea, paroxysmal nocturnal dyspnea. GI: Denies abdominal pain, nausea, vomiting, diarrhea or constipation GU: Denies dysuria, frequency, hesitancy or incontinence MS: Denies muscle aches, joint pain or swelling Neuro: Denies headache, neurologic deficits (focal weakness, numbness, tingling), abnormal gait Psych: Denies anxiety, depression, SI/HI/AVH Skin: Denies new rashes or lesions ID: Denies sick contacts, exotic exposures, travel  Past Medical History:  Diagnosis Date  . Asthma   . CAD (coronary artery disease)    . Falls frequently   . Hyperlipidemia   . Hypertension     Past Surgical History:  Procedure Laterality Date  . CATARACT EXTRACTION    . CORONARY ANGIOPLASTY  10 -15 years ago   stent in [place] ,[place]  . IR FLUORO GUIDE PORT INSERTION RIGHT  10/23/2016  . IR US GUIDE VASC ACCESS RIGHT  10/23/2016    SOCIAL HISTORY:  reports that she has quit smoking. Her smoking use included cigarettes. She started smoking about 34 years ago. She has never used smokeless tobacco. She reports that she does not drink alcohol or use drugs.  Allergies  Allergen Reactions  . Rituximab Other (See Comments)    "She coded"/Upset stomach and blood pressure dropped    FAMILY HISTORY: Family History  Problem Relation Age of Onset  . Cancer Father        colon     Prior to Admission medications   Medication Sig Start Date End Date Taking? Authorizing Provider  acyclovir (ZOVIRAX) 400 MG tablet Take 1 tablet (400 mg total) by mouth daily. 06/04/17  Yes Gorsuch, Ni, MD  amLODipine (NORVASC) 2.5 MG tablet Take 2.5 mg by mouth daily. 10/26/17  Yes [provider]  aspirin EC 81 MG EC tablet Take 1 tablet (81 mg total) by mouth daily. 05/02/17  Yes Oswald Hillock, MD  isosorbide mononitrate (IMDUR) 30 MG 24 hr tablet Take 2 tablets (60 mg total) by mouth daily. Patient taking differently: Take 30 mg by mouth daily.  07/23/17  Yes Florencia Reasons, MD  lovastatin (MEVACOR) 40 MG tablet Take 40 mg by mouth at bedtime.  05/09/13  Yes [provider]  metoprolol tartrate (LOPRESSOR) 25 MG tablet Take 1 tablet (25  mg total) by mouth 2 (two) times daily. 05/01/17  Yes Lama, Marge Duncans, MD  montelukast (SINGULAIR) 10 MG tablet Take 10 mg by mouth at bedtime. 05/15/17  Yes [provider]  potassium chloride (K-DUR) 10 MEQ tablet Take 10 mEq by mouth daily. 03/24/16  Yes [provider]  predniSONE (DELTASONE) 10 MG tablet Take 10 mg by mouth daily with breakfast.   Yes [provider]    sertraline (ZOLOFT) 50 MG tablet Take 50 mg by mouth daily.   Yes [provider]  acetaminophen (TYLENOL) 500 MG tablet Take 650 mg by mouth daily as needed for mild pain or headache.     [provider]  lidocaine-prilocaine (EMLA) cream Apply 1 application topically as needed. Patient not taking: Reported on 11/19/2017 10/27/16   Heath Lark, MD  omeprazole (PRILOSEC) 20 MG capsule Take 1 capsule (20 mg total) by mouth daily. Patient not taking: Reported on 11/19/2017 01/06/17   Heath Lark, MD  ondansetron (ZOFRAN) 8 MG tablet Take 1 tablet (8 mg total) by mouth 2 (two) times daily as needed for refractory nausea / vomiting. Start on day 3 after chemotherapy. Patient not taking: Reported on 11/19/2017 06/04/17   Heath Lark, MD  predniSONE (DELTASONE) 20 MG tablet TAKE 20 MG BY MOUTH EVERY OTHER DAY WITH BREAKFAST ALTERNATING WITH 10 MG EVERY OTHER DAY Patient not taking: Reported on 11/19/2017 09/16/17   Heath Lark, MD  prochlorperazine (COMPAZINE) 10 MG tablet Take 1 tablet (10 mg total) by mouth every 6 (six) hours as needed (Nausea or vomiting). Patient not taking: Reported on 11/19/2017 06/04/17   Heath Lark, MD    Physical Exam: Vitals:   11/21/17 1328 11/21/17 1341 11/21/17 1343  BP: 129/61    Pulse: 73    Resp: 17    Temp: 98.2 F (36.8 C)    TempSrc: Oral    SpO2: 100% 99%   Height:   5' 6"  (1.676 m)      Constitutional: NAD, calm, comfortable, hard of hearing Eyes: PERRL, lids and conjunctivae normal.  Ecchymosis noted around her left eye. ENMT: Mucous membranes are moist. Posterior pharynx clear of any exudate or lesions.Normal dentition.  Neck: normal, supple, no masses, no thyromegaly Respiratory: clear to auscultation bilaterally, no wheezing, no crackles. Normal respiratory effort. No accessory muscle use.  Cardiovascular: Regular rate and rhythm, no murmurs / rubs / gallops. No extremity edema. 2+ pedal pulses. No carotid bruits.  Abdomen: no  tenderness, no masses palpated. No hepatosplenomegaly. Bowel sounds positive.  Musculoskeletal: no clubbing / cyanosis. No joint deformity upper and lower extremities. Good ROM, no contractures. Normal muscle tone.  Skin: no rashes, lesions, ulcers. No induration Neurologic: CN 2-12 grossly intact. Sensation intact, DTR normal. Strength 5/5 in all 4.  Psychiatric: Normal judgment and insight. Alert and oriented x 3. Normal mood.     Labs on Admission: I have personally reviewed following labs and imaging studies  CBC: Recent Labs  Lab 11/19/17 1414 11/21/17 1539  WBC 5.7 5.8  NEUTROABS 1.8 2.6  HGB 13.1 12.3  HCT 40.9 40.3  MCV 87.2 89.2  PLT 231 973   Basic Metabolic Panel: Recent Labs  Lab 11/19/17 1414 11/21/17 1539  NA 143 140  K 4.0 4.0  CL 104 104  CO2 30 28  GLUCOSE 134* 130*  BUN 24* 21  CREATININE 1.54* 1.37*  CALCIUM 9.0 9.0   GFR: Estimated Creatinine Clearance: 26.1 mL/min (A) (by C-G formula based on SCr of 1.37  mg/dL (H)). Liver Function Tests: Recent Labs  Lab 11/19/17 1414  AST 17  ALT 13  ALKPHOS 62  BILITOT 0.5  PROT 5.9*  ALBUMIN 3.7   No results for input(s): LIPASE, AMYLASE in the last 168 hours. No results for input(s): AMMONIA in the last 168 hours. Coagulation Profile: No results for input(s): INR, PROTIME in the last 168 hours. Cardiac Enzymes: No results for input(s): CKTOTAL, CKMB, CKMBINDEX, TROPONINI in the last 168 hours. BNP (last 3 results) No results for input(s): PROBNP in the last 8760 hours. HbA1C: No results for input(s): HGBA1C in the last 72 hours. CBG: Recent Labs  Lab 11/21/17 1323  GLUCAP 176*   Lipid Profile: No results for input(s): CHOL, HDL, LDLCALC, TRIG, CHOLHDL, LDLDIRECT in the last 72 hours. Thyroid Function Tests: No results for input(s): TSH, T4TOTAL, FREET4, T3FREE, THYROIDAB in the last 72 hours. Anemia Panel: No results for input(s): VITAMINB12, FOLATE, FERRITIN, TIBC, IRON, RETICCTPCT in the  last 72 hours. Urine analysis:    Component Value Date/Time   COLORURINE YELLOW 11/19/2017 1651   APPEARANCEUR CLEAR 11/19/2017 1651   LABSPEC 1.009 11/19/2017 1651   LABSPEC 1.015 11/13/2016 0848   PHURINE 7.0 11/19/2017 1651   GLUCOSEU NEGATIVE 11/19/2017 1651   GLUCOSEU Negative 11/13/2016 0848   HGBUR NEGATIVE 11/19/2017 1651   BILIRUBINUR NEGATIVE 11/19/2017 1651   BILIRUBINUR Negative 11/13/2016 0848   KETONESUR NEGATIVE 11/19/2017 1651   PROTEINUR NEGATIVE 11/19/2017 1651   UROBILINOGEN 0.2 11/13/2016 0848   NITRITE NEGATIVE 11/19/2017 1651   LEUKOCYTESUR NEGATIVE 11/19/2017 1651   LEUKOCYTESUR Large 11/13/2016 0848   Sepsis Labs: !!!!!!!!!!!!!!!!!!!!!!!!!!!!!!!!!!!!!!!!!!!! @LABRCNTIP (procalcitonin:4,lacticidven:4) )No results found for this or any previous visit (from the past 240 hour(s)).   Radiological Exams on Admission: Dg Shoulder Right  Result Date: 11/21/2017 CLINICAL DATA:  Recent fall with pain EXAM: RIGHT SHOULDER - 2+ VIEW COMPARISON:  None. FINDINGS: Frontal and Y scapular images were obtained. No acute fracture or dislocation. There is marked narrowing of the glenohumeral joint with subchondral sclerosis in this area. 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 There is only slight osteoarthritic change in the acromioclavicular joint. No erosive change. Port-A-Cath tip is in the superior vena cava. Visualized right lung clear. IMPRESSION: Advanced osteoarthritic change glenohumeral joint. Slight osteoarthritic change acromioclavicular joint. Synovial chondromatosis medially. No acute fracture or dislocation. Electronically Signed   By: Lowella Grip III M.D.   On: 11/21/2017 15:24   Ct Head Wo Contrast  Result Date: 11/21/2017 CLINICAL DATA:  Syncopal episode. Fall 2 days ago. Injury to right side of head. EXAM: CT HEAD WITHOUT CONTRAST CT CERVICAL SPINE WITHOUT CONTRAST TECHNIQUE: Multidetector CT imaging  of the head and cervical spine was performed following the standard protocol without intravenous contrast. Multiplanar CT image reconstructions of the cervical spine were also generated. COMPARISON:  11/19/2017 FINDINGS: CT HEAD FINDINGS Brain: Ventricles, cisterns and other CSF spaces are normal. There is no mass, mass effect, shift of midline structures or acute hemorrhage. Moderate chronic ischemic microvascular disease. No acute infarction. Vascular: No hyperdense vessel or unexpected calcification. Skull: Normal. Negative for fracture or focal lesion. Sinuses/Orbits: Orbits are within normal. Findings suggesting previous sinus surgery with fenestration of the medial wall of the maxillary sinuses. Minimal mucosal membrane thickening involving the right maxillary sinus. Other: None. CT CERVICAL SPINE FINDINGS Alignment: 2 mm anterior subluxation of C2 and C3 unchanged and likely degenerative. Skull base and vertebrae: Vertebral body heights are maintained. There is  moderate spondylosis of the cervical spine. Atlantoaxial articulation is unremarkable. Moderate uncovertebral joint spurring and facet arthropathy. No acute fracture or subluxation. Moderate bilateral neural foraminal narrowing at multiple levels due to adjacent bony spurring. Soft tissues and spinal canal: No prevertebral fluid or swelling. No visible canal hematoma. Disc levels: Moderate disc space narrowing at the C3-4, C4-5, C5-6 and C6-7 levels. Upper chest: No acute findings. Other: None. IMPRESSION: No acute brain injury. Moderate chronic ischemic microvascular disease. No acute cervical spine injury. Moderate spondylosis of the cervical spine with moderate multilevel disc disease and moderate bilateral neural foraminal narrowing due to adjacent bony spurring. Minimal chronic sinus inflammatory change. Evidence suggesting previous sinus surgery. Electronically Signed   By: Marin Olp M.D.   On: 11/21/2017 15:16   Ct Cervical Spine Wo  Contrast  Result Date: 11/21/2017 CLINICAL DATA:  Syncopal episode. Fall 2 days ago. Injury to right side of head. EXAM: CT HEAD WITHOUT CONTRAST CT CERVICAL SPINE WITHOUT CONTRAST TECHNIQUE: Multidetector CT imaging of the head and cervical spine was performed following the standard protocol without intravenous contrast. Multiplanar CT image reconstructions of the cervical spine were also generated. COMPARISON:  11/19/2017 FINDINGS: CT HEAD FINDINGS Brain: Ventricles, cisterns and other CSF spaces are normal. There is no mass, mass effect, shift of midline structures or acute hemorrhage. Moderate chronic ischemic microvascular disease. No acute infarction. Vascular: No hyperdense vessel or unexpected calcification. Skull: Normal. Negative for fracture or focal lesion. Sinuses/Orbits: Orbits are within normal. Findings suggesting previous sinus surgery with fenestration of the medial wall of the maxillary sinuses. Minimal mucosal membrane thickening involving the right maxillary sinus. Other: None. CT CERVICAL SPINE FINDINGS Alignment: 2 mm anterior subluxation of C2 and C3 unchanged and likely degenerative. Skull base and vertebrae: Vertebral body heights are maintained. There is moderate spondylosis of the cervical spine. Atlantoaxial articulation is unremarkable. Moderate uncovertebral joint spurring and facet arthropathy. No acute fracture or subluxation. Moderate bilateral neural foraminal narrowing at multiple levels due to adjacent bony spurring. Soft tissues and spinal canal: No prevertebral fluid or swelling. No visible canal hematoma. Disc levels: Moderate disc space narrowing at the C3-4, C4-5, C5-6 and C6-7 levels. Upper chest: No acute findings. Other: None. IMPRESSION: No acute brain injury. Moderate chronic ischemic microvascular disease. No acute cervical spine injury. Moderate spondylosis of the cervical spine with moderate multilevel disc disease and moderate bilateral neural foraminal narrowing  due to adjacent bony spurring. Minimal chronic sinus inflammatory change. Evidence suggesting previous sinus surgery. Electronically Signed   By: Marin Olp M.D.   On: 11/21/2017 15:16     All images have been reviewed by me personally.  EKG: Independently reviewed. NSD  Assessment/Plan Principal Problem:   Syncope Active Problems:   HTN (hypertension)   CAD in native artery   COPD (chronic obstructive pulmonary disease) (HCC)   CLL (chronic lymphocytic leukemia) (HCC)   Chronic kidney disease, stage III (moderate) (HCC)    Syncope -Admit the patient to the hospital for observation.  Unknown exact etiology of the syncope. - Monitor on telemetry.  Will get MRI of the brain without contrast.  Had an MRI back in January 2019 which showed advanced atherosclerotic disease.  Also had an echocardiogram at that time which showed ejection fraction 55 to 60% without any wall motion abnormality.  I will hold off on repeating echocardiogram at this time.  Also had carotid Dopplers back in January 2019 which showed bilateral stenosis of the right and left ICA 1-39%. -We will hydrate  her overnight.  Check orthostatics again in the morning. -Provide supportive care.  Monitor on telemetry. -CT of the head is negative for acute brain injury, shows moderate chronic ischemic vascular changes, moderate spondylosis of cervical spine.  History of CLL -Follows with Dr. Alvy Bimler outpatient and is currently on daily prednisone.  Will place on sliding scale while she is here.  History of CAD status post stenting 2000 and 2005 - Continue her home aspirin and metoprolol.  Currently will hold Imdur  Essential hypertension -Norvasc on hold.  Hyperlipidemia -Continue statin.  History of depression -Continue Zoloft 50 mg.    DVT prophylaxis: SCDs Code Status: Limited (ok with cpr but no intubation) Family Communication:  None at bedside  Disposition Plan:  TBD Consults called: None  Admission  status: Tele Obs    Time Spent: 65 minutes.  >50% of the time was devoted to discussing the patients care, assessment, plan and disposition with other care givers along with counseling the patient about the risks and benefits of treatment.   Please Note: This patient record was dictated using Editor, commissioning. Chart creation errors have been sought, but may not always have been located. Such creation errors do not reflect on the Standard of Medical Care.   Aaliyana Fredericks Arsenio Loader MD [facility] Pager [phone]  If 7PM-7AM, please contact night-coverage [email or web address] Password [facility]  11/21/2017, 4:52 PM

## 2017-11-21 NOTE — ED Triage Notes (Signed)
Pt had a witnessed syncope episode while eating lunch. When pt woke up the staff reported PT was confused. Staff reported  Pt had a change in by asking repetitive questions. Staff at [facility] reported Pt this is not her normal bas line. Pt alert x1 .  Pt fell 2 days ago and hit RT side of head ,skin tear to RFT arm and pain to Rt shoulder. On arrival to ED Pt observed to have a raised area on Rt side of fore head. DSy present on Rt fore arm from previous fall. Pt moves arms be reports Rt shoulder hurts.

## 2017-11-21 NOTE — Plan of Care (Signed)
  Problem: Clinical Measurements: Goal: Ability to maintain clinical measurements within normal limits will improve Outcome: Progressing   Problem: Clinical Measurements: Goal: Cardiovascular complication will be avoided Outcome: Progressing

## 2017-11-21 NOTE — ED Triage Notes (Signed)
TC to [place] ,daughter, to report Pt admission . Daughter also informed Pt did not bring her hearing aid to the hospital

## 2017-11-22 ENCOUNTER — Observation Stay (HOSPITAL_COMMUNITY): Payer: Medicare Other

## 2017-11-22 ENCOUNTER — Other Ambulatory Visit (HOSPITAL_COMMUNITY): Payer: Medicare Other

## 2017-11-22 DIAGNOSIS — R55 Syncope and collapse: Secondary | ICD-10-CM | POA: Diagnosis not present

## 2017-11-22 LAB — COMPREHENSIVE METABOLIC PANEL
ALBUMIN: 3.1 g/dL — AB (ref 3.5–5.0)
ALK PHOS: 63 U/L (ref 38–126)
ALT: 12 U/L (ref 0–44)
AST: 13 U/L — AB (ref 15–41)
Anion gap: 7 (ref 5–15)
BILIRUBIN TOTAL: 0.7 mg/dL (ref 0.3–1.2)
BUN: 14 mg/dL (ref 8–23)
CO2: 30 mmol/L (ref 22–32)
CREATININE: 0.91 mg/dL (ref 0.44–1.00)
Calcium: 8.9 mg/dL (ref 8.9–10.3)
Chloride: 106 mmol/L (ref 98–111)
GFR calc Af Amer: >60 mL/min (ref >60)
GFR calc non Af Amer: 54 mL/min — ABNORMAL LOW (ref >60)
GLUCOSE: 97 mg/dL (ref 70–99)
POTASSIUM: 3.5 mmol/L (ref 3.5–5.1)
Sodium: 143 mmol/L (ref 135–145)
TOTAL PROTEIN: 5.2 g/dL — AB (ref 6.5–8.1)

## 2017-11-22 LAB — CBC
HEMATOCRIT: 38.4 % (ref 36.0–46.0)
Hemoglobin: 12 g/dL (ref 12.0–15.0)
MCH: 27.5 pg (ref 26.0–34.0)
MCHC: 31.3 g/dL (ref 30.0–36.0)
MCV: 88.1 fL (ref 78.0–100.0)
Platelets: 196 10*3/uL (ref 150–400)
RBC: 4.36 MIL/uL (ref 3.87–5.11)
RDW: 15.7 % — AB (ref 11.5–15.5)
WBC: 10 10*3/uL (ref 4.0–10.5)

## 2017-11-22 LAB — GLUCOSE, CAPILLARY
GLUCOSE-CAPILLARY: 103 mg/dL — AB (ref 70–99)
GLUCOSE-CAPILLARY: 114 mg/dL — AB (ref 70–99)
Glucose-Capillary: 84 mg/dL (ref 70–99)
Glucose-Capillary: 109 mg/dL — ABNORMAL HIGH (ref 70–99)

## 2017-11-22 LAB — TROPONIN I

## 2017-11-22 LAB — APTT: aPTT: 28 seconds (ref 24–36)

## 2017-11-22 LAB — PROTIME-INR
INR: 1.04
Prothrombin Time: 13.5 seconds (ref 11.4–15.2)

## 2017-11-22 MED ORDER — SODIUM CHLORIDE 0.9 % IV SOLN: INTRAVENOUS | Status: DC

## 2017-11-22 MED ORDER — AMLODIPINE BESYLATE 5 MG PO TABS
5.0000 mg | ORAL_TABLET | Freq: Every day | ORAL | Status: DC
  Administered 2017-11-22: 5 mg via ORAL
  Filled 2017-11-22: qty 1

## 2017-11-22 MED ORDER — AMLODIPINE BESYLATE 5 MG PO TABS
5.0000 mg | ORAL_TABLET | Freq: Once | ORAL | Status: AC
  Administered 2017-11-22: 5 mg via ORAL
  Filled 2017-11-22: qty 1

## 2017-11-22 MED ORDER — HYDRALAZINE HCL 20 MG/ML IJ SOLN: 10.0000 mg | Freq: Four times a day (QID) | INTRAMUSCULAR | Status: DC | PRN

## 2017-11-22 MED ORDER — AMLODIPINE BESYLATE 10 MG PO TABS
10.0000 mg | ORAL_TABLET | Freq: Every day | ORAL | Status: DC
  Administered 2017-11-23: 10 mg via ORAL
  Filled 2017-11-22: qty 1

## 2017-11-22 MED ORDER — LORAZEPAM 2 MG/ML IJ SOLN
1.0000 mg | Freq: Two times a day (BID) | INTRAMUSCULAR | Status: DC | PRN
  Administered 2017-11-22: 1 mg via INTRAVENOUS
  Filled 2017-11-22: qty 1

## 2017-11-22 NOTE — Progress Notes (Signed)
Per RN this is not a good time for them to come down for MRI.  Try again later.

## 2017-11-22 NOTE — Progress Notes (Addendum)
Patient Demographics:    Carla Conway, is a 84 y.o. female, DOB - [birth year], XTK:[id number]  Admit date - 11/21/2017   Admitting Physician Ankit Arsenio Loader, MD  Outpatient Primary MD for the patient is Koirala, Dibas, MD  LOS - 0   Chief Complaint  Patient presents with  . Loss of Consciousness        Subjective:    [name] today has no fevers, no emesis,  No chest pain,  Resting comfortably , daughter Ms [name], confusional episodes and disorientation  Assessment  & Plan :    Principal Problem:   Syncope Active Problems:   HTN (hypertension)   CAD in native artery   COPD (chronic obstructive pulmonary disease) (HCC)   CLL (chronic lymphocytic leukemia) (HCC)   Chronic kidney disease, stage III (moderate) (HCC)  Brief Summary 84 y.o. Female with medical history significant of coronary artery disease, hypertension, hyperlipidemia, CLL comes to the hospital after having an episode of syncope with loss of consciousness  Plan:- 1)Recurrent Episodes of Syncope---- ??? Mechanical falls Vs Syncope, patient unable to tolerate/complete MRI brain on 11/22/2017, last MRI brain was January 2019 without acute findings but patient did have advanced atherosclerotic disease , serial troponin negative, EKG sinus rhythm with PVCs, no history of seizures or postictal type findings, CT head without acute findings, CT of the C-spine without acute findings, carotid artery Dopplers from January 2019 without hemodynamically significant stenosis, echocardiogram from January 2019 with preserved EF over 55% without significant wall motion abnormalities, also there was no significant valvular heart disease specifically no significant aortic stenosis.  Consider repeat MRI on 11/23/2017, get physical therapy to evaluate.  If work-up is negative patient may need outpatient event monitor for 30 days post discharge to  rule out significant arrhythmia, vital signs without significant orthostatic drop in blood pressure or increasing heart rate  2)H/o CLL--- patient sees oncologist Dr. Alvy Bimler, currently on prednisone and acyclovir  3)H/o CAD--- prior angioplasty/PCI in 2000 and again in 2005, stable without ACS symptoms, troponin and EKGs are unremarkable, continue aspirin and metoprolol, isosorbide on hold for now  4)Hypertension--BP is not at goal, restart Amlodipine 5 mg daily,   may use IV Hydralazine 10 mg  Every 4 hours Prn for systolic blood pressure over 160 mmhg  5)Depression/Dementia--stable, continue Zoloft 50 mg daily, lorazepam as needed agitation, patient has cognitive deficits at baseline,  Code Status : partial code, desires medications, CPR, does not desire endotracheal intubation  Disposition Plan  :  ALF with HH PT, d/w daughter Ms [name] DVT Prophylaxis  :  SCD/Heparin -  Lab Results  Component Value Date   PLT 196 11/22/2017    Inpatient Medications  Scheduled Meds: . acyclovir  400 mg Oral Daily  . [START ON 11/23/2017] amLODipine  10 mg Oral Daily  . amLODipine  5 mg Oral Once  . aspirin EC  81 mg Oral Daily  . insulin aspart  0-9 Units Subcutaneous TID WC  . metoprolol tartrate  25 mg Oral BID  . montelukast  10 mg Oral QHS  . potassium chloride  10 mEq Oral Daily  . predniSONE  10 mg Oral Q breakfast  . sertraline  50 mg Oral Daily  Continuous Infusions: . sodium chloride     PRN Meds:.acetaminophen **OR** acetaminophen, hydrALAZINE, LORazepam, ondansetron **OR** ondansetron (ZOFRAN) IV, senna-docusate    Anti-infectives (From admission, onward)   Start     Dose/Rate Route Frequency Ordered Stop   11/22/17 1000  acyclovir (ZOVIRAX) tablet 400 mg     400 mg Oral Daily 11/21/17 1643          Objective:   Vitals:   11/22/17 0517 11/22/17 0807 11/22/17 1155 11/22/17 1455  BP: (!) 160/76 (!) 185/70 (!) 170/70 (!) 169/76  Pulse:  71  77  Resp:        Temp:    98.2 F (36.8 C)  TempSrc:    Oral  SpO2:    95%  Weight:      Height:        Wt Readings from Last 3 Encounters:  11/22/17 65.5 kg  11/19/17 61.2 kg  09/25/17 64.1 kg     Intake/Output Summary (Last 24 hours) at 11/22/2017 1650 Last data filed at 11/22/2017 1238 Gross per 24 hour  Intake 1885.74 ml  Output 800 ml  Net 1085.74 ml     Physical Exam  Gen:- Awake Alert,  In no apparent distress  HEENT:- [place].AT, No sclera icterus Ears- HOH Neck-Supple Neck,No JVD,.  Lungs-  CTAB , good air movement CV- S1, S2 normal, irregular but not irregularly irregular Abd-  +ve B.Sounds, Abd Soft, No tenderness,    Extremity/Skin:- No  edema,   good pulses Psych- cognitive deficits, oriented x 1 Neuro-no new focal deficits, no tremors   Data Review:   Micro Results No results found for this or any previous visit (from the past 240 hour(s)).  Radiology Reports Dg Chest 2 View  Result Date: 11/19/2017 CLINICAL DATA:  84 year old female status post fall with right hip pain. EXAM: CHEST - 2 VIEW COMPARISON:  Chest radiographs 07/21/2017 and earlier. FINDINGS: Semi upright AP and lateral views of the chest. Lower lung volumes. Calcified aortic atherosclerosis. Other mediastinal contours are within normal limits. Stable right chest power port. Chronic linear atelectasis or scarring along the right minor fissure. No pneumothorax, pulmonary edema, pleural effusion, or acute pulmonary opacity. Stable visualized osseous structures. Chronic upper thoracic compression fracture. Negative visible bowel gas pattern. IMPRESSION: 1. Lower lung volumes, no acute cardiopulmonary abnormality. 2.  Aortic Atherosclerosis (ICD10-I70.0). Electronically Signed   By: Genevie Ann M.D.   On: 11/19/2017 14:54   Dg Pelvis 1-2 Views  Result Date: 11/19/2017 CLINICAL DATA:  Fall, right hip pain EXAM: PELVIS - 1-2 VIEW COMPARISON:  05/08/2016 FINDINGS: Mild symmetric degenerative changes in the hips with joint  space narrowing and early spurring. SI joints are symmetric and unremarkable. No acute bony abnormality. Specifically, no fracture, subluxation, or dislocation. Degenerative changes in the visualized lower lumbar spine. IMPRESSION: No acute bony abnormality. Electronically Signed   By: Rolm Baptise M.D.   On: 11/19/2017 14:52   Dg Shoulder Right  Result Date: 11/21/2017 CLINICAL DATA:  Recent fall with pain EXAM: RIGHT SHOULDER - 2+ VIEW COMPARISON:  None. FINDINGS: Frontal and Y scapular images were obtained. No acute fracture or dislocation. There is marked narrowing of the glenohumeral joint with subchondral sclerosis in this area. 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 There is only slight osteoarthritic change in the acromioclavicular joint. No erosive change. Port-A-Cath tip is in the superior vena cava. Visualized right lung clear. IMPRESSION: Advanced osteoarthritic change glenohumeral joint. Slight osteoarthritic  change acromioclavicular joint. Synovial chondromatosis medially. No acute fracture or dislocation. Electronically Signed   By: Lowella Grip III M.D.   On: 11/21/2017 15:24   Ct Head Wo Contrast  Result Date: 11/21/2017 CLINICAL DATA:  Syncopal episode. Fall 2 days ago. Injury to right side of head. EXAM: CT HEAD WITHOUT CONTRAST CT CERVICAL SPINE WITHOUT CONTRAST TECHNIQUE: Multidetector CT imaging of the head and cervical spine was performed following the standard protocol without intravenous contrast. Multiplanar CT image reconstructions of the cervical spine were also generated. COMPARISON:  11/19/2017 FINDINGS: CT HEAD FINDINGS Brain: Ventricles, cisterns and other CSF spaces are normal. There is no mass, mass effect, shift of midline structures or acute hemorrhage. Moderate chronic ischemic microvascular disease. No acute infarction. Vascular: No hyperdense vessel or unexpected calcification. Skull: Normal. Negative for  fracture or focal lesion. Sinuses/Orbits: Orbits are within normal. Findings suggesting previous sinus surgery with fenestration of the medial wall of the maxillary sinuses. Minimal mucosal membrane thickening involving the right maxillary sinus. Other: None. CT CERVICAL SPINE FINDINGS Alignment: 2 mm anterior subluxation of C2 and C3 unchanged and likely degenerative. Skull base and vertebrae: Vertebral body heights are maintained. There is moderate spondylosis of the cervical spine. Atlantoaxial articulation is unremarkable. Moderate uncovertebral joint spurring and facet arthropathy. No acute fracture or subluxation. Moderate bilateral neural foraminal narrowing at multiple levels due to adjacent bony spurring. Soft tissues and spinal canal: No prevertebral fluid or swelling. No visible canal hematoma. Disc levels: Moderate disc space narrowing at the C3-4, C4-5, C5-6 and C6-7 levels. Upper chest: No acute findings. Other: None. IMPRESSION: No acute brain injury. Moderate chronic ischemic microvascular disease. No acute cervical spine injury. Moderate spondylosis of the cervical spine with moderate multilevel disc disease and moderate bilateral neural foraminal narrowing due to adjacent bony spurring. Minimal chronic sinus inflammatory change. Evidence suggesting previous sinus surgery. Electronically Signed   By: Marin Olp M.D.   On: 11/21/2017 15:16   Ct Head Wo Contrast  Result Date: 11/19/2017 CLINICAL DATA:  Patient here with fall from standing, hematoma to right forehead, skin tear to right arm, right shoulder pain. EXAM: CT HEAD WITHOUT CONTRAST CT CERVICAL SPINE WITHOUT CONTRAST TECHNIQUE: Multidetector CT imaging of the head and cervical spine was performed following the standard protocol without intravenous contrast. Multiplanar CT image reconstructions of the cervical spine were also generated. COMPARISON:  None. FINDINGS: CT HEAD FINDINGS Brain: No evidence of acute infarction, hemorrhage,  extra-axial collection, ventriculomegaly, or mass effect. Generalized cerebral atrophy. Periventricular white matter low attenuation likely secondary to microangiopathy. Vascular: Cerebrovascular atherosclerotic calcifications are noted. Skull: Negative for fracture or focal lesion. Sinuses/Orbits: Visualized portions of the orbits are unremarkable. Visualized portions of the paranasal sinuses and mastoid air cells are unremarkable. Other: Right frontal scalp hematoma. CT CERVICAL SPINE FINDINGS Alignment: 2 mm anterolisthesis of C2 on C3. Skull base and vertebrae: No acute fracture. No primary bone lesion or focal pathologic process. Soft tissues and spinal canal: No prevertebral fluid or swelling. No visible canal hematoma. Disc levels: Degenerative disc disease with disc height loss at C3-4, C4-5, C5-6 and C6-7. moderate left facet arthropathy at C2-3. Moderate bilateral facet arthropathy at C3-4. Bilateral uncovertebral degenerative changes at C3-4 with right foraminal stenosis. Bilateral uncovertebral degenerative changes at C4-5. Bilateral uncovertebral degenerative changes at C5-6 with bilateral foraminal stenosis. Bilateral uncovertebral degenerative changes at C6-7 with right foraminal stenosis. Upper chest: Lung apices are clear. Other: No fluid collection or hematoma. Bilateral carotid artery atherosclerosis. IMPRESSION: . 1. No  acute intracranial pathology. 2. No acute osseous injury of the cervical spine. 3. Right frontal scalp hematoma. Electronically Signed   By: Kathreen Devoid   On: 11/19/2017 15:04   Ct Cervical Spine Wo Contrast  Result Date: 11/21/2017 CLINICAL DATA:  Syncopal episode. Fall 2 days ago. Injury to right side of head. EXAM: CT HEAD WITHOUT CONTRAST CT CERVICAL SPINE WITHOUT CONTRAST TECHNIQUE: Multidetector CT imaging of the head and cervical spine was performed following the standard protocol without intravenous contrast. Multiplanar CT image reconstructions of the cervical spine  were also generated. COMPARISON:  11/19/2017 FINDINGS: CT HEAD FINDINGS Brain: Ventricles, cisterns and other CSF spaces are normal. There is no mass, mass effect, shift of midline structures or acute hemorrhage. Moderate chronic ischemic microvascular disease. No acute infarction. Vascular: No hyperdense vessel or unexpected calcification. Skull: Normal. Negative for fracture or focal lesion. Sinuses/Orbits: Orbits are within normal. Findings suggesting previous sinus surgery with fenestration of the medial wall of the maxillary sinuses. Minimal mucosal membrane thickening involving the right maxillary sinus. Other: None. CT CERVICAL SPINE FINDINGS Alignment: 2 mm anterior subluxation of C2 and C3 unchanged and likely degenerative. Skull base and vertebrae: Vertebral body heights are maintained. There is moderate spondylosis of the cervical spine. Atlantoaxial articulation is unremarkable. Moderate uncovertebral joint spurring and facet arthropathy. No acute fracture or subluxation. Moderate bilateral neural foraminal narrowing at multiple levels due to adjacent bony spurring. Soft tissues and spinal canal: No prevertebral fluid or swelling. No visible canal hematoma. Disc levels: Moderate disc space narrowing at the C3-4, C4-5, C5-6 and C6-7 levels. Upper chest: No acute findings. Other: None. IMPRESSION: No acute brain injury. Moderate chronic ischemic microvascular disease. No acute cervical spine injury. Moderate spondylosis of the cervical spine with moderate multilevel disc disease and moderate bilateral neural foraminal narrowing due to adjacent bony spurring. Minimal chronic sinus inflammatory change. Evidence suggesting previous sinus surgery. Electronically Signed   By: Marin Olp M.D.   On: 11/21/2017 15:16   Ct Cervical Spine Wo Contrast  Result Date: 11/19/2017 CLINICAL DATA:  Patient here with fall from standing, hematoma to right forehead, skin tear to right arm, right shoulder pain. EXAM: CT  HEAD WITHOUT CONTRAST CT CERVICAL SPINE WITHOUT CONTRAST TECHNIQUE: Multidetector CT imaging of the head and cervical spine was performed following the standard protocol without intravenous contrast. Multiplanar CT image reconstructions of the cervical spine were also generated. COMPARISON:  None. FINDINGS: CT HEAD FINDINGS Brain: No evidence of acute infarction, hemorrhage, extra-axial collection, ventriculomegaly, or mass effect. Generalized cerebral atrophy. Periventricular white matter low attenuation likely secondary to microangiopathy. Vascular: Cerebrovascular atherosclerotic calcifications are noted. Skull: Negative for fracture or focal lesion. Sinuses/Orbits: Visualized portions of the orbits are unremarkable. Visualized portions of the paranasal sinuses and mastoid air cells are unremarkable. Other: Right frontal scalp hematoma. CT CERVICAL SPINE FINDINGS Alignment: 2 mm anterolisthesis of C2 on C3. Skull base and vertebrae: No acute fracture. No primary bone lesion or focal pathologic process. Soft tissues and spinal canal: No prevertebral fluid or swelling. No visible canal hematoma. Disc levels: Degenerative disc disease with disc height loss at C3-4, C4-5, C5-6 and C6-7. moderate left facet arthropathy at C2-3. Moderate bilateral facet arthropathy at C3-4. Bilateral uncovertebral degenerative changes at C3-4 with right foraminal stenosis. Bilateral uncovertebral degenerative changes at C4-5. Bilateral uncovertebral degenerative changes at C5-6 with bilateral foraminal stenosis. Bilateral uncovertebral degenerative changes at C6-7 with right foraminal stenosis. Upper chest: Lung apices are clear. Other: No fluid collection or hematoma. Bilateral carotid  artery atherosclerosis. IMPRESSION: . 1. No acute intracranial pathology. 2. No acute osseous injury of the cervical spine. 3. Right frontal scalp hematoma. Electronically Signed   By: Kathreen Devoid   On: 11/19/2017 15:04   Dg Humerus Right  Result  Date: 11/19/2017 CLINICAL DATA:  Right upper arm pain due to a fall today. Initial encounter. EXAM: RIGHT HUMERUS - 2+ VIEW COMPARISON:  None. FINDINGS: There is no evidence of fracture or other focal bone lesions. Soft tissues are unremarkable. IMPRESSION: Negative exam. Electronically Signed   By: Inge Rise M.D.   On: 11/19/2017 14:52     CBC Recent Labs  Lab 11/19/17 1414 11/21/17 1539 11/22/17 0550  WBC 5.7 5.8 10.0  HGB 13.1 12.3 12.0  HCT 40.9 40.3 38.4  PLT 231 200 196  MCV 87.2 89.2 88.1  MCH 27.9 27.2 27.5  MCHC 32.0 30.5 31.3  RDW 16.0* 15.9* 15.7*  LYMPHSABS 3.2 3.1  --   MONOABS 0.7 0.1  --   EOSABS 0.0 0.0  --   BASOSABS 0.0 0.0  --     Chemistries  Recent Labs  Lab 11/19/17 1414 11/21/17 1539 11/22/17 0550  NA 143 140 143  K 4.0 4.0 3.5  CL 104 104 106  CO2 30 28 30   GLUCOSE 134* 130* 97  BUN 24* 21 14  CREATININE 1.54* 1.37* 0.91  CALCIUM 9.0 9.0 8.9  AST 17  --  13*  ALT 13  --  12  ALKPHOS 62  --  63  BILITOT 0.5  --  0.7   ------------------------------------------------------------------------------------------------------------------ No results for input(s): CHOL, HDL, LDLCALC, TRIG, CHOLHDL, LDLDIRECT in the last 72 hours.  Lab Results  Component Value Date   HGBA1C 6.0 (H) 11/21/2017   ------------------------------------------------------------------------------------------------------------------ Recent Labs    11/21/17 1816  TSH 1.839   ------------------------------------------------------------------------------------------------------------------ No results for input(s): VITAMINB12, FOLATE, FERRITIN, TIBC, IRON, RETICCTPCT in the last 72 hours.  Coagulation profile Recent Labs  Lab 11/22/17 0550  INR 1.04    No results for input(s): DDIMER in the last 72 hours.  Cardiac Enzymes Recent Labs  Lab 11/21/17 1816 11/21/17 2349 11/22/17 0550  TROPONINI <0.03 <0.03 <0.03    ------------------------------------------------------------------------------------------------------------------ No results found for: BNP   Roxan Hockey M.D on 11/22/2017 at 4:50 PM   Go to [email or web address] - password [email or web address] for contact info  [facility] - Office  [phone]

## 2017-11-22 NOTE — Plan of Care (Signed)
  Problem: Health Behavior/Discharge Planning: Goal: Ability to manage health-related needs will improve Outcome: Progressing   Problem: Clinical Measurements: Goal: Ability to maintain clinical measurements within normal limits will improve Outcome: Progressing   Problem: Clinical Measurements: Goal: Cardiovascular complication will be avoided Outcome: Progressing   Problem: Safety: Goal: Ability to remain free from injury will improve Outcome: Progressing

## 2017-11-22 NOTE — Progress Notes (Signed)
Charge RN called and said they could come now.  I explained we already had a patient in transport to come down and we will call back later to try and get patient again.

## 2017-11-23 DIAGNOSIS — J431 Panlobular emphysema: Secondary | ICD-10-CM | POA: Diagnosis not present

## 2017-11-23 DIAGNOSIS — I10 Essential (primary) hypertension: Secondary | ICD-10-CM

## 2017-11-23 DIAGNOSIS — C9190 Lymphoid leukemia, unspecified not having achieved remission: Secondary | ICD-10-CM

## 2017-11-23 DIAGNOSIS — R55 Syncope and collapse: Secondary | ICD-10-CM | POA: Diagnosis not present

## 2017-11-23 LAB — GLUCOSE, CAPILLARY
GLUCOSE-CAPILLARY: 100 mg/dL — AB (ref 70–99)
Glucose-Capillary: 91 mg/dL (ref 70–99)

## 2017-11-23 MED ORDER — AMLODIPINE BESYLATE 10 MG PO TABS: 10.0000 mg | ORAL_TABLET | Freq: Every day | ORAL | 5 refills | Status: DC

## 2017-11-23 MED ORDER — METOPROLOL TARTRATE 25 MG PO TABS: 25.0000 mg | ORAL_TABLET | Freq: Two times a day (BID) | ORAL | 2 refills | Status: DC

## 2017-11-23 MED ORDER — HYDRALAZINE HCL 25 MG PO TABS: 25.0000 mg | ORAL_TABLET | Freq: Three times a day (TID) | ORAL | 3 refills | Status: DC

## 2017-11-23 MED ORDER — ONDANSETRON HCL 4 MG PO TABS: 4.0000 mg | ORAL_TABLET | Freq: Four times a day (QID) | ORAL | 0 refills | Status: DC | PRN

## 2017-11-23 NOTE — Care Management (Signed)
1301 11-14-17 [place] Order received for Carla Conway. Carla Conway will set the patient up with Carla Conway. CSW making the ALF aware. No further needs from CM at this time. Bethena Roys, RN,BSN, CCM Case Manager [phone]

## 2017-11-23 NOTE — Clinical Social Work Note (Signed)
Clinical Social Work Assessment  Patient Details  Name: Carla Conway MRN: [id number] Date of Birth: 1926/04/15  Date of referral:  11/23/17               Reason for consult:  Facility Placement, Discharge Planning(from [facility])                Permission sought to share information with:  Facility Sport and exercise psychologist, Family Supports Permission granted to share information::  Yes, Verbal Permission Granted  Name::     Carla Conway  Agency::  [facility]  Relationship::  daughter  Contact Information:  [phone]  Housing/Transportation Living arrangements for the past 2 months:  [place] of Information:  Adult Children Patient Interpreter Needed:  None Criminal Activity/Legal Involvement Pertinent to Current Situation/Hospitalization:  No - Comment as needed Significant Relationships:  Adult Children Lives with:  Facility Resident Do you feel safe going back to the place where you live?  Yes Need for family participation in patient care:  Yes (Comment)  Care giving concerns: Patient from [facility]. PT recommending home health.   Social Worker assessment / plan: CSW met with patient and daughter at bedside. Patient alert, hard of hearing. CSW introduced self and role and discussed disposition planning. Patient and daughter agreeable for patient to return to ALF. CSW confirmed with resident care coordinator at [facility], [place], that facility can take patient back today. Faxed discharge summary, FL2, and home health orders to ALF. Patient's daughter to provide transportation back to the facility. CSW signing off, as no additional needs identified and patient will discharge today.  Employment status:  Retired Research officer, political party) PT Recommendations:  Home with [place] / Referral to community resources:  Other (Comment Required)(back to ALF)  Patient/Family's Response to  care: Patient and daughter appreciative of care.  Patient/Family's Understanding of and Emotional Response to Diagnosis, Current Treatment, and Prognosis: Daughter with understanding of patient's condition and agreeable for patient to return to ALF today.  Emotional Assessment Appearance:  Appears stated age Attitude/Demeanor/Rapport:  Engaged Affect (typically observed):  Accepting, Pleasant Orientation:  Oriented to Self, Oriented to Place Alcohol / Substance use:  Not Applicable Psych involvement (Current and /or in the community):  No (Comment)  Discharge Needs  Concerns to be addressed:  Care Coordination, Discharge Planning Concerns Readmission within the last 30 days:  No Current discharge risk:  None Barriers to Discharge:  No Barriers Identified   Estanislado Emms, LCSW 11/23/2017, 12:33 PM

## 2017-11-23 NOTE — Progress Notes (Signed)
Patient and daughter received discharge information and acknowledged understanding of it. Patient received printed prescriptions. RN attempted to give report to [facility].

## 2017-11-23 NOTE — NC FL2 (Signed)
[place] LEVEL OF CARE SCREENING TOOL     IDENTIFICATION  Patient Name: Carla Conway [facility] Birthdate: [birth year] Sex: female Admission Date (Current Location): 11/21/2017  [facility] and [place] Number:  Herbalist and Address:  The [facility], [place] [address] [place], [place] [address]      Provider Number: [id number]  Attending Physician Name and Address:  Roxan Hockey, MD  Relative Name and Phone Number:  [name], daughter, [phone]    Current Level of Care: Hospital Recommended Level of Care: [place] Prior Approval Number:    Date Approved/Denied:   PASRR Number:    Discharge Plan: Domiciliary (Rest home)(ALF)    Current Diagnoses: Patient Active Problem List   Diagnosis Date Noted  . Other fatigue 09/25/2017  . Malnutrition of moderate degree 07/20/2017  . Enterocolitis 07/18/2017  . Pancytopenia, acquired ([place]) 07/14/2017  . Malignant cachexia ([place]) 06/04/2017  . Hypokalemia 04/29/2017  . Left arm pain 04/29/2017  . TIA (transient ischemic attack) 04/29/2017  . Abdominal pain 04/28/2017  . Elevated CK 04/01/2017  . AKI (acute kidney injury) ([place]) 03/31/2017  . Dehydration 03/31/2017  . Generalized weakness 03/31/2017  . External hemorrhoids without complication 94/49/6759  . Port-A-Cath in place 01/06/2017  . Gastritis 01/06/2017  . Poor memory 12/11/2016  . Dysuria 11/13/2016  . Anaphylaxis 10/29/2016  . Anaphylactoid reaction 10/29/2016  . Goals of care, counseling/discussion 10/28/2016  . Chronic kidney disease, stage III (moderate) ([place]) 10/17/2016  . 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 Neutropenia ([place]) 09/23/2016  . CLL (chronic lymphocytic leukemia) ([place]) 05/11/2016  . Acute on chronic diastolic heart failure ([place]) 05/10/2016  . Influenza A 05/09/2016  . Lymphadenopathy of head and neck region 05/09/2016  . Lymphadenopathy, thoracic 05/09/2016  . COPD (chronic  obstructive pulmonary disease) ([place]) 05/09/2016  . Debility 05/09/2016  . Syncopal episodes 05/09/2016  . Syncope 05/08/2016  . HTN (hypertension) 05/18/2013  . CAD in native artery 05/18/2013    Orientation RESPIRATION BLADDER Height & Weight     Self, Place  Normal Continent Weight: 144 lb 8 oz (65.5 kg) Height:  5' 6"  (167.6 cm)  BEHAVIORAL SYMPTOMS/MOOD NEUROLOGICAL BOWEL NUTRITION STATUS      Continent Diet(heart healthy)  AMBULATORY STATUS COMMUNICATION OF NEEDS Skin   (baseline) Verbally Normal                       Personal Care Assistance Level of Assistance  Bathing, Feeding, Dressing(baseline)           Functional Limitations Info  Sight, Hearing, Speech Sight Info: Adequate Hearing Info: Impaired Speech Info: Adequate    SPECIAL CARE FACTORS FREQUENCY  PT (By licensed PT), OT (By licensed OT)     PT Frequency: 3x/week OT Frequency: 3x/week            Contractures Contractures Info: Not present    Additional Factors Info  Code Status, Allergies, Psychotropic, Insulin Sliding Scale Code Status Info: Partial Allergies Info: Rituximab Psychotropic Info: zoloft Insulin Sliding Scale Info: none       Current Medications (11/23/2017):  This is the current hospital active medication list Current Facility-Administered Medications  Medication Dose Route Frequency Provider Last Rate Last Dose  . 0.9 %  sodium chloride infusion   Intravenous Continuous Emokpae, Courage, MD 40 mL/hr at 11/22/17 1719    . acetaminophen (TYLENOL) tablet 650 mg  650 mg Oral Q6H PRN Damita Lack, MD  650 mg at 11/22/17 5643   Or  . acetaminophen (TYLENOL) suppository 650 mg  650 mg Rectal Q6H PRN Amin, Ankit Chirag, MD      . acyclovir (ZOVIRAX) tablet 400 mg  400 mg Oral Daily Amin, Ankit Chirag, MD   400 mg at 11/23/17 0939  . amLODipine (NORVASC) tablet 10 mg  10 mg Oral Daily Emokpae, Courage, MD   10 mg at 11/23/17 0940  . aspirin EC tablet 81 mg  81 mg Oral  Daily Amin, Ankit Chirag, MD   81 mg at 11/23/17 0939  . hydrALAZINE (APRESOLINE) injection 10 mg  10 mg Intravenous Q6H PRN Emokpae, Courage, MD      . insulin aspart (novoLOG) injection 0-9 Units  0-9 Units Subcutaneous TID WC Amin, Ankit Chirag, MD      . LORazepam (ATIVAN) injection 1 mg  1 mg Intravenous Q12H PRN Roxan Hockey, MD   1 mg at 11/22/17 2144  . metoprolol tartrate (LOPRESSOR) tablet 25 mg  25 mg Oral BID Amin, Ankit Chirag, MD   25 mg at 11/23/17 0941  . montelukast (SINGULAIR) tablet 10 mg  10 mg Oral QHS Amin, Ankit Chirag, MD   10 mg at 11/22/17 2138  . ondansetron (ZOFRAN) tablet 4 mg  4 mg Oral Q6H PRN Amin, Ankit Chirag, MD       Or  . ondansetron (ZOFRAN) injection 4 mg  4 mg Intravenous Q6H PRN Amin, Ankit Chirag, MD      . potassium chloride (K-DUR,KLOR-CON) CR tablet 10 mEq  10 mEq Oral Daily Amin, Ankit Chirag, MD   10 mEq at 11/23/17 0939  . predniSONE (DELTASONE) tablet 10 mg  10 mg Oral Q breakfast Amin, Ankit Chirag, MD   10 mg at 11/23/17 0941  . senna-docusate (Senokot-S) tablet 1 tablet  1 tablet Oral QHS PRN Amin, Ankit Chirag, MD      . sertraline (ZOLOFT) tablet 50 mg  50 mg Oral Daily Amin, Ankit Chirag, MD   50 mg at 11/23/17 3295     Discharge Medications: Please see discharge summary for a list of discharge medications.  Relevant Imaging Results:  Relevant Lab Results:   Additional Information SSN: [id number]  Estanislado Emms, LCSW

## 2017-11-23 NOTE — NC FL2 (Signed)
[place] LEVEL OF CARE SCREENING TOOL     IDENTIFICATION  Patient Name: Carla Conway [facility] Birthdate: 20-Aug-1926 Sex: female Admission Date (Current Location): 11/21/2017  [facility] and [place] Number:  Herbalist and Address:  The [place], [place] [address] [place], [place] [address]      Provider Number: [id number]  Attending Physician Name and Address:  Roxan Hockey, MD  Relative Name and Phone Number:  [name], daughter, [phone]    Current Level of Care: Hospital Recommended Level of Care: [place] Prior Approval Number:    Date Approved/Denied:   PASRR Number:    Discharge Plan: SNF    Current Diagnoses: Patient Active Problem List   Diagnosis Date Noted  . Other fatigue 09/25/2017  . Malnutrition of moderate degree 07/20/2017  . Enterocolitis 07/18/2017  . Pancytopenia, acquired ([place]) 07/14/2017  . Malignant cachexia ([place]) 06/04/2017  . Hypokalemia 04/29/2017  . Left arm pain 04/29/2017  . TIA (transient ischemic attack) 04/29/2017  . Abdominal pain 04/28/2017  . Elevated CK 04/01/2017  . AKI (acute kidney injury) ([place]) 03/31/2017  . Dehydration 03/31/2017  . Generalized weakness 03/31/2017  . External hemorrhoids without complication 33/00/7622  . Port-A-Cath in place 01/06/2017  . Gastritis 01/06/2017  . Poor memory 12/11/2016  . Dysuria 11/13/2016  . Anaphylaxis 10/29/2016  . Anaphylactoid reaction 10/29/2016  . Goals of care, counseling/discussion 10/28/2016  . Chronic kidney disease, stage III (moderate) ([place]) 10/17/2016  . Lymphoma, small lymphocytic ([place]) 10/15/2016  . Neutropenia ([place]) 09/23/2016  . CLL (chronic lymphocytic leukemia) ([place]) 05/11/2016  . Acute on chronic diastolic heart failure ([place]) 05/10/2016  . Influenza A 05/09/2016  . Lymphadenopathy of head and neck region 05/09/2016  . Lymphadenopathy, thoracic 05/09/2016  . COPD (chronic obstructive pulmonary  disease) ([place]) 05/09/2016  . Debility 05/09/2016  . Syncopal episodes 05/09/2016  . Syncope 05/08/2016  . HTN (hypertension) 05/18/2013  . CAD in native artery 05/18/2013    Orientation RESPIRATION BLADDER Height & Weight     Self, Place  Normal Continent Weight: 144 lb 8 oz (65.5 kg) Height:  5' 6"  (167.6 cm)  BEHAVIORAL SYMPTOMS/MOOD NEUROLOGICAL BOWEL NUTRITION STATUS      Continent Diet(heart healthy)  AMBULATORY STATUS COMMUNICATION OF NEEDS Skin   (baseline) Verbally Normal                       Personal Care Assistance Level of Assistance  Bathing, Feeding, Dressing(baseline)           Functional Limitations Info  Sight, Hearing, Speech Sight Info: Adequate Hearing Info: Impaired Speech Info: Adequate    SPECIAL CARE FACTORS FREQUENCY  PT (By licensed PT), OT (By licensed OT)     PT Frequency: 3x/week OT Frequency: 3x/week            Contractures Contractures Info: Not present    Additional Factors Info  Code Status, Allergies, Psychotropic, Insulin Sliding Scale Code Status Info: Partial Allergies Info: Rituximab Psychotropic Info: zoloft Insulin Sliding Scale Info: novolog 3x/day with meals       Current Medications (11/23/2017):  This is the current hospital active medication list Current Facility-Administered Medications  Medication Dose Route Frequency Provider Last Rate Last Dose  . 0.9 %  sodium chloride infusion   Intravenous Continuous Emokpae, Courage, MD 40 mL/hr at 11/22/17 1719    . acetaminophen (TYLENOL) tablet 650 mg  650 mg Oral Q6H PRN Damita Lack, MD  650 mg at 11/22/17 2929   Or  . acetaminophen (TYLENOL) suppository 650 mg  650 mg Rectal Q6H PRN Amin, Ankit Chirag, MD      . acyclovir (ZOVIRAX) tablet 400 mg  400 mg Oral Daily Amin, Ankit Chirag, MD   400 mg at 11/23/17 0939  . amLODipine (NORVASC) tablet 10 mg  10 mg Oral Daily Emokpae, Courage, MD   10 mg at 11/23/17 0940  . aspirin EC tablet 81 mg  81 mg Oral  Daily Amin, Ankit Chirag, MD   81 mg at 11/23/17 0939  . hydrALAZINE (APRESOLINE) injection 10 mg  10 mg Intravenous Q6H PRN Emokpae, Courage, MD      . insulin aspart (novoLOG) injection 0-9 Units  0-9 Units Subcutaneous TID WC Amin, Ankit Chirag, MD      . LORazepam (ATIVAN) injection 1 mg  1 mg Intravenous Q12H PRN Roxan Hockey, MD   1 mg at 11/22/17 2144  . metoprolol tartrate (LOPRESSOR) tablet 25 mg  25 mg Oral BID Amin, Ankit Chirag, MD   25 mg at 11/23/17 0941  . montelukast (SINGULAIR) tablet 10 mg  10 mg Oral QHS Amin, Ankit Chirag, MD   10 mg at 11/22/17 2138  . ondansetron (ZOFRAN) tablet 4 mg  4 mg Oral Q6H PRN Amin, Ankit Chirag, MD       Or  . ondansetron (ZOFRAN) injection 4 mg  4 mg Intravenous Q6H PRN Amin, Ankit Chirag, MD      . potassium chloride (K-DUR,KLOR-CON) CR tablet 10 mEq  10 mEq Oral Daily Amin, Ankit Chirag, MD   10 mEq at 11/23/17 0939  . predniSONE (DELTASONE) tablet 10 mg  10 mg Oral Q breakfast Amin, Ankit Chirag, MD   10 mg at 11/23/17 0941  . senna-docusate (Senokot-S) tablet 1 tablet  1 tablet Oral QHS PRN Amin, Ankit Chirag, MD      . sertraline (ZOLOFT) tablet 50 mg  50 mg Oral Daily Amin, Ankit Chirag, MD   50 mg at 11/23/17 0903     Discharge Medications: Please see discharge summary for a list of discharge medications.  Relevant Imaging Results:  Relevant Lab Results:   Additional Information SSN: [id number]  Estanislado Emms, LCSW

## 2017-11-23 NOTE — Discharge Summary (Signed)
Carla Conway, is a 84 y.o. female  DOB [birth year]  MRN [id number].  Admission date:  11/21/2017  Admitting Physician  Damita Lack, MD  Discharge Date:  11/23/2017   Primary MD  Lujean Amel, MD  Recommendations for primary care physician for things to follow:   1)Stage II hypertension with Recurrent Falls and Syncope------ home health RN for cardiopulmonary assessment, medication compliance and blood pressure monitoring Home health physical therapy for gait and balance training  2) get up slowly and use your walker while walking to avoid falling  3)Low salt  diet advised  4) your doctor may place a Heart  event/holter monitor for up to month to see if you have any significant irregular heartbeat/arrhythmias  Admission Diagnosis  Syncope and collapse [R55] TIA (transient ischemic attack) [G45.9]   Discharge Diagnosis  Syncope and collapse [R55] TIA (transient ischemic attack) [G45.9]    Principal Problem:   Syncope Active Problems:   HTN (hypertension)   CAD in native artery   COPD (chronic obstructive pulmonary disease) (HCC)   CLL (chronic lymphocytic leukemia) ([place])   Chronic kidney disease, stage III (moderate) ([place])      Past Medical History:  Diagnosis Date  . Asthma   . CAD (coronary artery disease)   . Falls frequently   . Hyperlipidemia   . Hypertension     Past Surgical History:  Procedure Laterality Date  . CATARACT EXTRACTION    . CORONARY ANGIOPLASTY  10 -15 years ago   stent in [place] ,[place]  . IR FLUORO GUIDE PORT INSERTION RIGHT  10/23/2016  . IR US GUIDE VASC ACCESS RIGHT  10/23/2016       HPI  from the history and physical done on the day of admission:    Chief Complaint: Syncope  HPI: Carla Conway is a 84 y.o. female with medical history significant of coronary artery disease, hypertension, hyperlipidemia, CLL comes to the hospital after having an  episode of syncope.  Patient states this morning she was feeling well and went downstairs to go eat breakfast and soon after she started feeling slightly dizzy and syncopized.  She thinks she passed out for about 2 minutes but when she regained consciousness she denies any postictal symptoms/headache/confusion.  She was sent to the hospital for further evaluation. Upon further questioning patient states this also happened to her a couple of days ago but was nowhere as severe as this therefore did not seek medical attention.  In the ER patient's labs were unremarkable.  Her vital signs were unremarkable as well.  Due to her concerning symptoms and second episode at home we will admit her for observation and further work-up.    Hospital Course:   Brief Summary 84 y.o.Femalewith medical history significant ofcoronary artery disease, hypertension, hyperlipidemia, CLL comes to the hospital after having an episode of syncope with loss of consciousness   Plan:- 1)Recurrent Episodes of Syncope---- ??? Mechanical falls Vs Syncope,  MRI brain on 8/18/201  without acute findings, last MRI brain was  January 2019 without acute findings but patient did have advanced atherosclerotic disease , serial troponin negative, EKG sinus rhythm with PVCs, no history of seizures or postictal type findings, CT head without acute findings, CT of the C-spine without acute findings, carotid artery Dopplers from January 2019 without hemodynamically significant stenosis, echocardiogram from January 2019 with preserved EF over 55% without significant wall motion abnormalities, also there was no significant valvular heart disease specifically no significant aortic stenosis.  physical therapy evaluation noted.  Given  negative cardiovascular and neurological w/u  patient may need outpatient event monitor for 30 days as outpatient to rule out significant arrhythmia, vital signs without significant orthostatic drop in blood pressure  or increasing heart rate...Marland KitchenMarland Kitchen Please see documented orthostatic vitals in EMR  2)H/o CLL--- patient sees oncologist Dr. Alvy Bimler, currently on prednisone and acyclovir  3)H/o CAD--- prior angioplasty/PCI in 2000 and again in 2005, stable without ACS symptoms, troponin and EKGs are unremarkable, continue aspirin and metoprolol, restart isosorbide  4)Hypertension--stage II, increase amlodipine to 10 mg daily,    add oral hydralazine 25 mg 3 times daily for better BP control  5)Depression/Dementia--stable, continue Zoloft 50 mg daily, l  patient has cognitive deficits at baseline,  Code Status : partial code, desires medications, CPR, does not desire endotracheal intubation  Disposition Plan  :  ALF with HH PT, d/w daughter Ms [name]  Discharge Condition: stable  Follow UP  Contact information for after-discharge care    Destination    [facility] .   Service:  Assisted Living Contact information: [place] [phone]               Diet and Activity recommendation:  As advised  Discharge Instructions    Discharge Instructions    Call MD for:  difficulty breathing, headache or visual disturbances   Complete by:  As directed    Call MD for:  persistant dizziness or light-headedness   Complete by:  As directed    Call MD for:  persistant nausea and vomiting   Complete by:  As directed    Call MD for:  temperature >100.4   Complete by:  As directed    Diet - low sodium heart healthy   Complete by:  As directed    Discharge instructions   Complete by:  As directed    1)Stage II hypertension with Recurrent Falls and Syncope------ home health RN for cardiopulmonary assessment, medication compliance and blood pressure monitoring Home health physical therapy for gait and balance training  2) get up slowly and use your walker while walking to avoid falling  3)Low salt  diet advised  4) your  doctor may place a Heart  event/holter monitor for up to month to see if you have any significant irregular heartbeat/arrhythmias   Increase activity slowly   Complete by:  As directed         Discharge Medications     Allergies as of 11/23/2017      Reactions   Rituximab Other (See Comments)   "She coded"/Upset stomach and blood pressure dropped      Medication List    STOP taking these medications   isosorbide mononitrate 30 MG 24 hr tablet Commonly known as:  IMDUR     TAKE these medications   acetaminophen 500 MG tablet Commonly known as:  TYLENOL Take 650 mg by mouth daily as needed for mild pain or headache.   acyclovir 400 MG  tablet Commonly known as:  ZOVIRAX Take 1 tablet (400 mg total) by mouth daily.   amLODipine 10 MG tablet Commonly known as:  NORVASC Take 1 tablet (10 mg total) by mouth daily. Start taking on:  11/24/2017 What changed:    medication strength  how much to take   aspirin 81 MG EC tablet Take 1 tablet (81 mg total) by mouth daily.   hydrALAZINE 25 MG tablet Commonly known as:  APRESOLINE Take 1 tablet (25 mg total) by mouth 3 (three) times daily.   lidocaine-prilocaine cream Commonly known as:  EMLA Apply 1 application topically as needed.   lovastatin 40 MG tablet Commonly known as:  MEVACOR Take 40 mg by mouth at bedtime.   metoprolol tartrate 25 MG tablet Commonly known as:  LOPRESSOR Take 1 tablet (25 mg total) by mouth 2 (two) times daily.   montelukast 10 MG tablet Commonly known as:  SINGULAIR Take 10 mg by mouth at bedtime.   omeprazole 20 MG capsule Commonly known as:  PRILOSEC Take 1 capsule (20 mg total) by mouth daily.   ondansetron 8 MG tablet Commonly known as:  ZOFRAN Take 1 tablet (8 mg total) by mouth 2 (two) times daily as needed for refractory nausea / vomiting. Start on day 3 after chemotherapy.   potassium chloride 10 MEQ tablet Commonly known as:  K-DUR Take 10 mEq by mouth daily.   predniSONE  10 MG tablet Commonly known as:  DELTASONE Take 10 mg by mouth daily with breakfast. What changed:  Another medication with the same name was removed. Continue taking this medication, and follow the directions you see here.   prochlorperazine 10 MG tablet Commonly known as:  COMPAZINE Take 1 tablet (10 mg total) by mouth every 6 (six) hours as needed (Nausea or vomiting).   sertraline 50 MG tablet Commonly known as:  ZOLOFT Take 50 mg by mouth daily.       Major procedures and Radiology Reports - PLEASE review detailed and final reports for all details, in brief -   Dg Chest 2 View  Result Date: 11/19/2017 CLINICAL DATA:  84 year old female status post fall with right hip pain. EXAM: CHEST - 2 VIEW COMPARISON:  Chest radiographs 07/21/2017 and earlier. FINDINGS: Semi upright AP and lateral views of the chest. Lower lung volumes. Calcified aortic atherosclerosis. Other mediastinal contours are within normal limits. Stable right chest power port. Chronic linear atelectasis or scarring along the right minor fissure. No pneumothorax, pulmonary edema, pleural effusion, or acute pulmonary opacity. Stable visualized osseous structures. Chronic upper thoracic compression fracture. Negative visible bowel gas pattern. IMPRESSION: 1. Lower lung volumes, no acute cardiopulmonary abnormality. 2.  Aortic Atherosclerosis (ICD10-I70.0). Electronically Signed   By: Genevie Ann M.D.   On: 11/19/2017 14:54   Dg Pelvis 1-2 Views  Result Date: 11/19/2017 CLINICAL DATA:  Fall, right hip pain EXAM: PELVIS - 1-2 VIEW COMPARISON:  05/08/2016 FINDINGS: Mild symmetric degenerative changes in the hips with joint space narrowing and early spurring. SI joints are symmetric and unremarkable. No acute bony abnormality. Specifically, no fracture, subluxation, or dislocation. Degenerative changes in the visualized lower lumbar spine. IMPRESSION: No acute bony abnormality. Electronically Signed   By: Rolm Baptise M.D.   On:  11/19/2017 14:52   Dg Shoulder Right  Result Date: 11/21/2017 CLINICAL DATA:  Recent fall with pain EXAM: RIGHT SHOULDER - 2+ VIEW COMPARISON:  None. FINDINGS: Frontal and Y scapular images were obtained. No acute fracture or dislocation. There is marked  narrowing of the glenohumeral joint with subchondral sclerosis in this area. 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 There is only slight osteoarthritic change in the acromioclavicular joint. No erosive change. Port-A-Cath tip is in the superior vena cava. Visualized right lung clear. IMPRESSION: Advanced osteoarthritic change glenohumeral joint. Slight osteoarthritic change acromioclavicular joint. Synovial chondromatosis medially. No acute fracture or dislocation. Electronically Signed   By: Lowella Grip III M.D.   On: 11/21/2017 15:24   Ct Head Wo Contrast  Result Date: 11/21/2017 CLINICAL DATA:  Syncopal episode. Fall 2 days ago. Injury to right side of head. EXAM: CT HEAD WITHOUT CONTRAST CT CERVICAL SPINE WITHOUT CONTRAST TECHNIQUE: Multidetector CT imaging of the head and cervical spine was performed following the standard protocol without intravenous contrast. Multiplanar CT image reconstructions of the cervical spine were also generated. COMPARISON:  11/19/2017 FINDINGS: CT HEAD FINDINGS Brain: Ventricles, cisterns and other CSF spaces are normal. There is no mass, mass effect, shift of midline structures or acute hemorrhage. Moderate chronic ischemic microvascular disease. No acute infarction. Vascular: No hyperdense vessel or unexpected calcification. Skull: Normal. Negative for fracture or focal lesion. Sinuses/Orbits: Orbits are within normal. Findings suggesting previous sinus surgery with fenestration of the medial wall of the maxillary sinuses. Minimal mucosal membrane thickening involving the right maxillary sinus. Other: None. CT CERVICAL SPINE FINDINGS Alignment: 2 mm anterior  subluxation of C2 and C3 unchanged and likely degenerative. Skull base and vertebrae: Vertebral body heights are maintained. There is moderate spondylosis of the cervical spine. Atlantoaxial articulation is unremarkable. Moderate uncovertebral joint spurring and facet arthropathy. No acute fracture or subluxation. Moderate bilateral neural foraminal narrowing at multiple levels due to adjacent bony spurring. Soft tissues and spinal canal: No prevertebral fluid or swelling. No visible canal hematoma. Disc levels: Moderate disc space narrowing at the C3-4, C4-5, C5-6 and C6-7 levels. Upper chest: No acute findings. Other: None. IMPRESSION: No acute brain injury. Moderate chronic ischemic microvascular disease. No acute cervical spine injury. Moderate spondylosis of the cervical spine with moderate multilevel disc disease and moderate bilateral neural foraminal narrowing due to adjacent bony spurring. Minimal chronic sinus inflammatory change. Evidence suggesting previous sinus surgery. Electronically Signed   By: Marin Olp M.D.   On: 11/21/2017 15:16   Ct Head Wo Contrast  Result Date: 11/19/2017 CLINICAL DATA:  Patient here with fall from standing, hematoma to right forehead, skin tear to right arm, right shoulder pain. EXAM: CT HEAD WITHOUT CONTRAST CT CERVICAL SPINE WITHOUT CONTRAST TECHNIQUE: Multidetector CT imaging of the head and cervical spine was performed following the standard protocol without intravenous contrast. Multiplanar CT image reconstructions of the cervical spine were also generated. COMPARISON:  None. FINDINGS: CT HEAD FINDINGS Brain: No evidence of acute infarction, hemorrhage, extra-axial collection, ventriculomegaly, or mass effect. Generalized cerebral atrophy. Periventricular white matter low attenuation likely secondary to microangiopathy. Vascular: Cerebrovascular atherosclerotic calcifications are noted. Skull: Negative for fracture or focal lesion. Sinuses/Orbits: Visualized  portions of the orbits are unremarkable. Visualized portions of the paranasal sinuses and mastoid air cells are unremarkable. Other: Right frontal scalp hematoma. CT CERVICAL SPINE FINDINGS Alignment: 2 mm anterolisthesis of C2 on C3. Skull base and vertebrae: No acute fracture. No primary bone lesion or focal pathologic process. Soft tissues and spinal canal: No prevertebral fluid or swelling. No visible canal hematoma. Disc levels: Degenerative disc disease with disc height loss at C3-4, C4-5, C5-6 and C6-7. moderate left facet arthropathy at C2-3. Moderate bilateral facet  arthropathy at C3-4. Bilateral uncovertebral degenerative changes at C3-4 with right foraminal stenosis. Bilateral uncovertebral degenerative changes at C4-5. Bilateral uncovertebral degenerative changes at C5-6 with bilateral foraminal stenosis. Bilateral uncovertebral degenerative changes at C6-7 with right foraminal stenosis. Upper chest: Lung apices are clear. Other: No fluid collection or hematoma. Bilateral carotid artery atherosclerosis. IMPRESSION: . 1. No acute intracranial pathology. 2. No acute osseous injury of the cervical spine. 3. Right frontal scalp hematoma. Electronically Signed   By: Kathreen Devoid   On: 11/19/2017 15:04   Ct Cervical Spine Wo Contrast  Result Date: 11/21/2017 CLINICAL DATA:  Syncopal episode. Fall 2 days ago. Injury to right side of head. EXAM: CT HEAD WITHOUT CONTRAST CT CERVICAL SPINE WITHOUT CONTRAST TECHNIQUE: Multidetector CT imaging of the head and cervical spine was performed following the standard protocol without intravenous contrast. Multiplanar CT image reconstructions of the cervical spine were also generated. COMPARISON:  11/19/2017 FINDINGS: CT HEAD FINDINGS Brain: Ventricles, cisterns and other CSF spaces are normal. There is no mass, mass effect, shift of midline structures or acute hemorrhage. Moderate chronic ischemic microvascular disease. No acute infarction. Vascular: No hyperdense  vessel or unexpected calcification. Skull: Normal. Negative for fracture or focal lesion. Sinuses/Orbits: Orbits are within normal. Findings suggesting previous sinus surgery with fenestration of the medial wall of the maxillary sinuses. Minimal mucosal membrane thickening involving the right maxillary sinus. Other: None. CT CERVICAL SPINE FINDINGS Alignment: 2 mm anterior subluxation of C2 and C3 unchanged and likely degenerative. Skull base and vertebrae: Vertebral body heights are maintained. There is moderate spondylosis of the cervical spine. Atlantoaxial articulation is unremarkable. Moderate uncovertebral joint spurring and facet arthropathy. No acute fracture or subluxation. Moderate bilateral neural foraminal narrowing at multiple levels due to adjacent bony spurring. Soft tissues and spinal canal: No prevertebral fluid or swelling. No visible canal hematoma. Disc levels: Moderate disc space narrowing at the C3-4, C4-5, C5-6 and C6-7 levels. Upper chest: No acute findings. Other: None. IMPRESSION: No acute brain injury. Moderate chronic ischemic microvascular disease. No acute cervical spine injury. Moderate spondylosis of the cervical spine with moderate multilevel disc disease and moderate bilateral neural foraminal narrowing due to adjacent bony spurring. Minimal chronic sinus inflammatory change. Evidence suggesting previous sinus surgery. Electronically Signed   By: Marin Olp M.D.   On: 11/21/2017 15:16   Ct Cervical Spine Wo Contrast  Result Date: 11/19/2017 CLINICAL DATA:  Patient here with fall from standing, hematoma to right forehead, skin tear to right arm, right shoulder pain. EXAM: CT HEAD WITHOUT CONTRAST CT CERVICAL SPINE WITHOUT CONTRAST TECHNIQUE: Multidetector CT imaging of the head and cervical spine was performed following the standard protocol without intravenous contrast. Multiplanar CT image reconstructions of the cervical spine were also generated. COMPARISON:  None.  FINDINGS: CT HEAD FINDINGS Brain: No evidence of acute infarction, hemorrhage, extra-axial collection, ventriculomegaly, or mass effect. Generalized cerebral atrophy. Periventricular white matter low attenuation likely secondary to microangiopathy. Vascular: Cerebrovascular atherosclerotic calcifications are noted. Skull: Negative for fracture or focal lesion. Sinuses/Orbits: Visualized portions of the orbits are unremarkable. Visualized portions of the paranasal sinuses and mastoid air cells are unremarkable. Other: Right frontal scalp hematoma. CT CERVICAL SPINE FINDINGS Alignment: 2 mm anterolisthesis of C2 on C3. Skull base and vertebrae: No acute fracture. No primary bone lesion or focal pathologic process. Soft tissues and spinal canal: No prevertebral fluid or swelling. No visible canal hematoma. Disc levels: Degenerative disc disease with disc height loss at C3-4, C4-5, C5-6 and C6-7. moderate left facet  arthropathy at C2-3. Moderate bilateral facet arthropathy at C3-4. Bilateral uncovertebral degenerative changes at C3-4 with right foraminal stenosis. Bilateral uncovertebral degenerative changes at C4-5. Bilateral uncovertebral degenerative changes at C5-6 with bilateral foraminal stenosis. Bilateral uncovertebral degenerative changes at C6-7 with right foraminal stenosis. Upper chest: Lung apices are clear. Other: No fluid collection or hematoma. Bilateral carotid artery atherosclerosis. IMPRESSION: . 1. No acute intracranial pathology. 2. No acute osseous injury of the cervical spine. 3. Right frontal scalp hematoma. Electronically Signed   By: Kathreen Devoid   On: 11/19/2017 15:04   Mr Brain Wo Contrast  Result Date: 11/22/2017 CLINICAL DATA:  84 year old female with recurrent syncope. EXAM: MRI HEAD WITHOUT CONTRAST TECHNIQUE: Multiplanar, multiecho pulse sequences of the brain and surrounding structures were obtained without intravenous contrast. COMPARISON:  Head and cervical spine CT 11/21/2017.  Brain MRI 04/29/2017. FINDINGS: Brain: No restricted diffusion to suggest acute infarction. No midline shift, mass effect, evidence of mass lesion, ventriculomegaly, extra-axial collection or acute intracranial hemorrhage. Cervicomedullary junction and pituitary are within normal limits. Confluent bilateral cerebral white matter T2 and FLAIR hyperintensity, deep white matter capsule involvement, moderate T2 heterogeneity throughout the deep gray matter nuclei, and mild to moderate patchy T2 hyperintensity in the pons are stable since January. No cortical encephalomalacia identified. No chronic cerebral blood products. No new signal abnormality identified. The cerebellum remains within normal limits. Vascular: Major intracranial vascular flow voids are preserved and stable. Skull and upper cervical spine: Multilevel chronic cervical spine degeneration. Normal bone marrow signal. Sinuses/Orbits: Stable postoperative changes to the orbits. Stable chronic paranasal sinus mucosal thickening. Other: Mastoids remain clear. There is a small round 6-7 millimeter soft tissue mass along the course of the left 7th and 8th cranial nerves (best seen on series 14, image 14 today), which was present but inconspicuous in January due to motion artifact on that exam. This involves the right porus acusticus, but the fundus of the left IAC is spared. Preserved T2 signal in the bilateral cochlea and vestibular structures. The right IAC appears normal. Right forehead scalp hematoma redemonstrated. IMPRESSION: 1. 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 2.  No acute intracranial abnormality; no acute infarct. 3. Stable advanced chronic signal changes in the cerebral white matter, deep gray matter, and pons which are most commonly due to chronic small vessel disease. Electronically Signed   By: Genevie Ann M.D.   On: 11/22/2017 20:05   Dg  Humerus Right  Result Date: 11/19/2017 CLINICAL DATA:  Right upper arm pain due to a fall today. Initial encounter. EXAM: RIGHT HUMERUS - 2+ VIEW COMPARISON:  None. FINDINGS: There is no evidence of fracture or other focal bone lesions. Soft tissues are unremarkable. IMPRESSION: Negative exam. Electronically Signed   By: Inge Rise M.D.   On: 11/19/2017 14:52    Micro Results   No results found for this or any previous visit (from the past 240 hour(s)).     Today   Subjective    Carla Conway today has no new concerns, intermittently confused and disoriented with cognitive challenges          Patient has been seen and examined prior to discharge   Objective   Blood pressure (!) 162/65, pulse 74, temperature 97.8 F (36.6 C), temperature source Oral, resp. rate 18, height 5' 6"  (1.676 m), weight 65.5 kg, SpO2 95 %.   Intake/Output Summary (Last 24 hours) at  11/23/2017 1101 Last data filed at 11/22/2017 1826 Gross per 24 hour  Intake 1162.09 ml  Output -  Net 1162.09 ml    Exam Gen:- Awake Alert,  In no apparent distress  HEENT:- [place].AT, No sclera icterus, peri-orbital ecchymosis from recent fall Neck-Supple Neck,No JVD,.  Lungs-  CTAB , good air movement CV- S1, S2 normal, irregular but not irregularly irregular Abd-  +ve B.Sounds, Abd Soft, No tenderness,    Extremity/Skin:- No  edema,   good pulses, skin is warm and dry Psych-no agitation, patient does have cognitive deficits, she is forgetful,  neuro-no new focal deficits, no tremors   Data Review   CBC w Diff:  Lab Results  Component Value Date   WBC 10.0 11/22/2017   HGB 12.0 11/22/2017   HGB 11.9 07/27/2017   HGB 12.4 02/17/2017   HCT 38.4 11/22/2017   HCT 37.9 02/17/2017   PLT 196 11/22/2017   PLT 374 07/27/2017   PLT 189 02/17/2017   LYMPHOPCT 55 11/21/2017   LYMPHOPCT 94.1 (H) 02/17/2017   BANDSPCT 2 11/21/2017   MONOPCT 1 11/21/2017   MONOPCT 0.3 02/17/2017   EOSPCT 0 11/21/2017   EOSPCT  1.3 02/17/2017   BASOPCT 0 11/21/2017   BASOPCT 0.5 02/17/2017    CMP:  Lab Results  Component Value Date   NA 143 11/22/2017   NA 138 02/17/2017   K 3.5 11/22/2017   K 3.7 02/17/2017   CL 106 11/22/2017   CO2 30 11/22/2017   CO2 27 02/17/2017   BUN 14 11/22/2017   BUN 16.4 02/17/2017   CREATININE 0.91 11/22/2017   CREATININE 1.1 02/17/2017   PROT 5.2 (L) 11/22/2017   PROT 6.4 02/17/2017   ALBUMIN 3.1 (L) 11/22/2017   ALBUMIN 3.9 02/17/2017   BILITOT 0.7 11/22/2017   BILITOT 0.60 02/17/2017   ALKPHOS 63 11/22/2017   ALKPHOS 74 02/17/2017   AST 13 (L) 11/22/2017   AST 13 02/17/2017   ALT 12 11/22/2017   ALT 13 02/17/2017    Total Discharge time is about 33 minutes  Roxan Hockey M.D on 11/23/2017 at 11:01 AM   Go to [email or web address] - password [email or web address] for contact info  [facility] - Office  [phone]

## 2017-11-23 NOTE — Care Management Obs Status (Signed)
[place] NOTIFICATION   Patient Details  Name: Eular Panek MRN: [id number] Date of Birth: December 02, 1926   Medicare Observation Status Notification Given:  Yes    Georgeanna Lea, RN 11/23/2017, 12:38 PM

## 2017-11-23 NOTE — Discharge Instructions (Signed)
1)Stage II hypertension with Recurrent Falls and Syncope------ home health RN for cardiopulmonary assessment, medication compliance and blood pressure monitoring Home health physical therapy for gait and balance training  2) get up slowly and use your walker while walking to avoid falling  3)Low salt  diet advised  4) your doctor may place a Heart  event/holter monitor for up to month to see if you have any significant irregular heartbeat/arrhythmias

## 2017-11-23 NOTE — Evaluation (Signed)
Physical Therapy Evaluation Patient Details Name: Carla Conway MRN: [id number] DOB: 04-Apr-1927 Today's Date: 11/23/2017   History of Present Illness  Pt adm after syncopal episode. PMH - syncope, CLL, dementia, ckd, copd,   Clinical Impression  Pt presents to PT with slightly decr mobility due to inactivity during hospitalization. Pt reported feeling nauseous at end of amb. Recommend HHPT at ALF. Since pt has had several syncopal episodes may also benefit from some type of life alert system at the ALF. Orthostatic BPs  Supine 162/65  Sitting 173/95  After ambulation 184/70       Follow Up Recommendations Home health PT(at ALF)    Equipment Recommendations  None recommended by PT    Recommendations for Other Services       Precautions / Restrictions Precautions Precautions: Fall Restrictions Weight Bearing Restrictions: No      Mobility  Bed Mobility Overal bed mobility: Modified Independent             General bed mobility comments: Incr time and efforts  Transfers Overall transfer level: Modified independent Equipment used: 4-wheeled walker             General transfer comment: Incr time and effort. 3 attempts to initially rise  Ambulation/Gait Ambulation/Gait assistance: Supervision Gait Distance (Feet): 300 Feet Assistive device: 4-wheeled walker Gait Pattern/deviations: Step-through pattern;Decreased stride length Gait velocity: decr Gait velocity interpretation: <1.31 ft/sec, indicative of household ambulator General Gait Details: Steady gait with rollator. Supervision for safety and lines  Stairs            Wheelchair Mobility    Modified Rankin (Stroke Patients Only)       Balance Overall balance assessment: Needs assistance Sitting-balance support: No upper extremity supported;Feet supported Sitting balance-Leahy Scale: Good     Standing balance support: No upper extremity supported;During functional activity Standing  balance-Leahy Scale: Fair                               Pertinent Vitals/Pain Pain Assessment: No/denies pain    Home Living Family/patient expects to be discharged to:: Assisted living               Home Equipment: Walker - 4 wheels      Prior Function Level of Independence: Independent with assistive device(s)         Comments: Uses rollator most of the time     Hand Dominance        Extremity/Trunk Assessment   Upper Extremity Assessment Upper Extremity Assessment: Overall WFL for tasks assessed    Lower Extremity Assessment Lower Extremity Assessment: Generalized weakness       Communication   Communication: HOH  Cognition Arousal/Alertness: Awake/alert Behavior During Therapy: WFL for tasks assessed/performed Overall Cognitive Status: No family/caregiver present to determine baseline cognitive functioning                                 General Comments: Memory issues evident      General Comments      Exercises     Assessment/Plan    PT Assessment All further PT needs can be met in the next venue of care  PT Problem List         PT Treatment Interventions      PT Goals (Current goals can be found in the Care Plan section)  Acute Rehab PT  Goals PT Goal Formulation: All assessment and education complete, DC therapy    Frequency     Barriers to discharge        Co-evaluation               AM-PAC PT "6 Clicks" Daily Activity  Outcome Measure Difficulty turning over in bed (including adjusting bedclothes, sheets and blankets)?: None Difficulty moving from lying on back to sitting on the side of the bed? : A Little Difficulty sitting down on and standing up from a chair with arms (e.g., wheelchair, bedside commode, etc,.)?: A Little Help needed moving to and from a bed to chair (including a wheelchair)?: None Help needed walking in hospital room?: None Help needed climbing 3-5 steps with a railing?  : A Little 6 Click Score: 21    End of Session Equipment Utilized During Treatment: Gait belt Activity Tolerance: Patient tolerated treatment well Patient left: in bed;with call bell/phone within reach;with bed alarm set   PT Visit Diagnosis: History of falling (Z91.81);Muscle weakness (generalized) (M62.81)    Time: 1039-1100 PT Time Calculation (min) (ACUTE ONLY): 21 min   Charges:   PT Evaluation $PT Eval Moderate Complexity: [place] Malkia Nippert PT [place] 11/23/2017, 11:55 AM

## 2017-11-23 NOTE — Progress Notes (Signed)
Patient will discharge back to [facility]F. Anticipated discharge date: 11/23/17 Family notified: [name], daughter at bedside Daughter will provide transportation for patient back to the facility.  Nurse to call report to [phone].   CSW signing off.  Estanislado Emms, [place]  Clinical Social Worker

## 2017-12-29 ENCOUNTER — Inpatient Hospital Stay (HOSPITAL_BASED_OUTPATIENT_CLINIC_OR_DEPARTMENT_OTHER): Payer: Medicare Other | Admitting: Hematology and Oncology

## 2017-12-29 ENCOUNTER — Encounter: Payer: Self-pay | Admitting: Hematology and Oncology

## 2017-12-29 ENCOUNTER — Telehealth: Payer: Self-pay | Admitting: Hematology and Oncology

## 2017-12-29 ENCOUNTER — Inpatient Hospital Stay: Payer: Medicare Other | Attending: Hematology and Oncology

## 2017-12-29 VITALS — BP 177/68 | HR 60 | Temp 98.0°F | Resp 18 | Ht 66.0 in | Wt 149.6 lb

## 2017-12-29 DIAGNOSIS — C9110 Chronic lymphocytic leukemia of B-cell type not having achieved remission: Secondary | ICD-10-CM

## 2017-12-29 DIAGNOSIS — Z7952 Long term (current) use of systemic steroids: Secondary | ICD-10-CM

## 2017-12-29 DIAGNOSIS — R64 Cachexia: Secondary | ICD-10-CM

## 2017-12-29 DIAGNOSIS — I10 Essential (primary) hypertension: Secondary | ICD-10-CM | POA: Insufficient documentation

## 2017-12-29 DIAGNOSIS — Z23 Encounter for immunization: Secondary | ICD-10-CM | POA: Diagnosis not present

## 2017-12-29 DIAGNOSIS — Z0000 Encounter for general adult medical examination without abnormal findings: Secondary | ICD-10-CM

## 2017-12-29 DIAGNOSIS — R413 Other amnesia: Secondary | ICD-10-CM

## 2017-12-29 DIAGNOSIS — Z299 Encounter for prophylactic measures, unspecified: Secondary | ICD-10-CM | POA: Insufficient documentation

## 2017-12-29 DIAGNOSIS — Z95828 Presence of other vascular implants and grafts: Secondary | ICD-10-CM

## 2017-12-29 LAB — CBC WITH DIFFERENTIAL/PLATELET
BASOS PCT: 1 %
Basophils Absolute: 0.1 10*3/uL (ref 0.0–0.1)
EOS ABS: 0.1 10*3/uL (ref 0.0–0.5)
EOS PCT: 1 %
HCT: 39.1 % (ref 34.8–46.6)
Hemoglobin: 12.5 g/dL (ref 11.6–15.9)
Lymphocytes Relative: 57 %
Lymphs Abs: 4.8 10*3/uL — ABNORMAL HIGH (ref 0.9–3.3)
MCH: 28.2 pg (ref 25.1–34.0)
MCHC: 32 g/dL (ref 31.5–36.0)
MCV: 88.1 fL (ref 79.5–101.0)
MONO ABS: 1.4 10*3/uL — AB (ref 0.1–0.9)
MONOS PCT: 17 %
Neutro Abs: 2 10*3/uL (ref 1.5–6.5)
Neutrophils Relative %: 24 %
PLATELETS: 232 10*3/uL (ref 145–400)
RBC: 4.44 MIL/uL (ref 3.70–5.45)
RDW: 16.2 % — ABNORMAL HIGH (ref 11.2–14.5)
WBC: 8.4 10*3/uL (ref 3.9–10.3)

## 2017-12-29 LAB — URIC ACID: URIC ACID, SERUM: 4.1 mg/dL (ref 2.5–7.1)

## 2017-12-29 MED ORDER — INFLUENZA VAC SPLIT QUAD 0.5 ML IM SUSY
0.5000 mL | PREFILLED_SYRINGE | Freq: Once | INTRAMUSCULAR | Status: AC
  Administered 2017-12-29: 0.5 mL via INTRAMUSCULAR

## 2017-12-29 MED ORDER — INFLUENZA VAC SPLIT QUAD 0.5 ML IM SUSY
PREFILLED_SYRINGE | INTRAMUSCULAR | Status: AC
  Filled 2017-12-29: qty 0.5

## 2017-12-29 NOTE — Progress Notes (Signed)
Carla Conway OFFICE PROGRESS NOTE  Patient Care Team: Koirala, [place], MD as PCP - General (Family Medicine) Heath Lark, MD as Consulting Physician (Hematology and Oncology)  ASSESSMENT & PLAN:  CLL (chronic lymphocytic leukemia) ([place]) She appears to be responding well to 10 mg of prednisone daily with improved in weight and less abdominal pain Due to significant decline in overall performance status and her memory, I recommend continue to same and I plan to see her back again in 6 months Due to significant overall improvement, and reluctance to pursue further systemic therapy, I recommend port removal and her daughter agrees.  Preventive measure We discussed the importance of preventive care and reviewed the vaccination programs. She does not have any prior allergic reactions to influenza vaccination. She agrees to proceed with influenza vaccination today and we will administer it today at the clinic.   Poor memory She continues to have poor memory but appears to be coping well in the assisted living facility. Her daughter is delighted to see that she is overall improving  Malignant cachexia ([place]) She is improving on low-dose prednisone.  We will continue indefinitely.  She has gained some weight.  HTN (hypertension) She has poorly controlled hypertension.  She is currently being monitored closely by her primary care doctor and I will defer to them for further management.   Orders Placed This Encounter  Procedures  . IR REMOVAL TUN ACCESS W/ PORT W/O FL MOD SED    Call daughter for appt details    Standing Status:   Future    Standing Expiration Date:   03/01/2019    Order Specific Question:   Reason for exam:    Answer:   no longer need port    Order Specific Question:   Preferred Imaging Location?    Answer:   [facility]    INTERVAL HISTORY: Please see below for problem oriented charting. She returns with her daughter for further follow-up She is  currently residing in an assisted living facility for the last 6 weeks She is doing well She has gained some weight with good appetite Denies recent infection, fever or chills No Carla lymphadenopathy. She had recent syncopal episode but denies fall.  SUMMARY OF ONCOLOGIC HISTORY:   CLL (chronic lymphocytic leukemia) ([place])   05/02/2013 Initial Diagnosis    She was found to have abnormal CBC from at least since 2015. According to the records from her primary care doctor that were faxed to the [facility], she had leukocytosis at least since January 2015. On 05/02/2013, total white blood cell count was 28.3, normal hemoglobin 13.4 and platelet count 239,000. She had predominantly Lymphocytosis. On 06/20/2013, a CBC show white count of 26.7, hemoglobin 12.9 and platelet count of 299,000 On admission, on 05/08/2016, white blood cell count was 38.4, hemoglobin 12.1 and platelet count of 210. Differential show predominantly lymphocytosis.    05/08/2016 Imaging    CT scan of chest 1. Negative for acute pulmonary embolism 2. Numerous pathologic nodes in the mediastinum, hila, axillae, supraclavicular regions and upper abdomen. The adenopathy is suspicious for malignancy such as lymphoma or CLL. If tissue diagnosis is desired, many of the supraclavicular and axillary nodes should be palpable and accessible to excisional biopsy. 3. Low-attenuation liver lesions, incompletely imaged and not characterized but more likely benign. 4. Small hiatal hernia.    09/22/2016 Pathology Results    Peripheral Blood Flow Cytometry - CHRONIC LYMPHOCYTIC LEUKEMIA. - SEE COMMENT. Diagnosis Comment: There is a population  of monoclonal B-cells with expression of CD5 and CD23. The phenotype is consistent with chronic lymphocytic leukemia    09/22/2016 Pathology Results    FISH confirmed trisomy 12    10/21/2016 PET scan    1. Extensive adenopathy in the neck, chest, abdomen, and pelvis as detailed above, primarily  Deauville 3 and [place]ville 4. Mild splenomegaly. 2. High activity in some soft tissue prominence along the anal region, possibly physiologic or due to hemorrhoidal activity or urinary incontinence, but correlation with anorectal exam recommended. 3. Focal abnormal accentuated high activity in the sigmoid colon along a region of abnormal stranding and diverticulosis, suspicious for active diverticulitis. Correlation with the patient's colon cancer screening history is recommended. If screening is not up-to-date, appropriate screening should be considered. 4. Aortic Atherosclerosis (ICD10-I70.0). Coronary atherosclerosis. 5. An exophytic high density lesion from the right kidney upper pole does not appear to be hypermetabolic and accordingly is most likely to be a benign complex cyst, but this lesion is not fully characterized on today' s exam. If the patient has hematuria or if otherwise clinically warranted, renal protocol MRI or CT examination could be utilized. 6. Some of the smaller hepatic lesions are nonspecific, but the larger hepatic lesions are photopenic and likely cysts.    10/23/2016 Procedure    Placement of single lumen port a cath via right internal jugular vein. The catheter tip lies at the cavo-atrial junction. A power injectable port a cath was placed and is ready for immediate use    10/29/2016 - 10/30/2016 Hospital Admission    She was admitted after allergic reaction to Rituximab    10/29/2016 Adverse Reaction    She received Rituximab complicated by allergic reaction. Treatment was abandoned    11/20/2016 - 04/28/2017 Chemotherapy    The patient is started on chlorambucil and low dose prednisone    04/29/2017 - 05/01/2017 Hospital Admission    She was admitted to the hospital for management of abdominal pain and weakness.    04/30/2017 Imaging    1. Mixed appearance with regard to the extensive nodal disease in the chest, abdomen, and pelvis. Areas of improved adenopathy include  the supraclavicular, axillary, and periaortic adenopathy. Areas of worsening include the subcarinal and right inguinal adenopathy. Some of the nodes are very similar to their size on prior PET-CT from 10/21/2016. 2. There is advanced sigmoid diverticulosis and just cephalad to this, there is a region of nodularity and mesenteric infiltration which has progressed compared to 10/21/2016. This may well represent an infiltrative component of malignancy rather than necessarily being due to chronic diverticulitis. No extraluminal gas is identified. No abscess noted. 3. Upper normal splenic size. There are several hypodense nonspecific splenic lesions. These were not readily appreciable on prior noncontrast images, but no associated hypermetabolic activity was previously seen. 4. Hyperdense 1.9 cm right kidney upper pole lesion is not entirely specific. This is probably a complex cyst. Attention on follow up studies suggested. 5. Transverse sclerosis in the sternal body compatible with late phase healing fracture. This was not present on the prior PET-CT from 10/21/2016. Reviewing the patient's records, she apparently received CPR after an anaphylactic reaction to chemotherapy on or around 10/29/2016, and it is possible (although speculative) that this represents residua from sternal trauma during CPR. Correlate with any extended soreness in the chest after that episode. 6. Other imaging findings of potential clinical significance: Aortic Atherosclerosis (ICD10-I70.0). Coronary atherosclerosis. Airway thickening is present, suggesting bronchitis or reactive airways disease. Scattered hepatic cysts. Multilevel lumbar  impingement due to spondylosis and degenerative disc disease.     07/18/2017 - 07/23/2017 Hospital Admission    She was admitted to the hospital for enterocolitis    07/18/2017 Imaging    Ct abdomen and pelvis Acute inflammatory changes of the descending colon and sigmoid colon, compatible with  acute diverticulitis/neutropenic enterocolitis in this patient with leukopenia.  Extensive lymph nodes within the abdomen/retroperitoneum, which are relatively decreased in size overall when compared to prior PET-CT of July 2018, likely reflecting treatment changes.  Aortic atherosclerosis and associated coronary artery disease. Aortic Atherosclerosis (ICD10-I70.0).     REVIEW OF SYSTEMS:   Constitutional: Denies fevers, chills or abnormal weight loss Eyes: Denies blurriness of vision Ears, nose, mouth, throat, and face: Denies mucositis or sore throat Respiratory: Denies cough, dyspnea or wheezes Cardiovascular: Denies palpitation, chest discomfort or lower extremity swelling Gastrointestinal:  Denies nausea, heartburn or change in bowel habits Skin: Denies abnormal skin rashes Lymphatics: Denies Carla lymphadenopathy or easy bruising Neurological:Denies numbness, tingling or Carla weaknesses Behavioral/Psych: Mood is stable, no Carla changes  All other systems were reviewed with the patient and are negative.  I have reviewed the past medical history, past surgical history, social history and family history with the patient and they are unchanged from previous note.  ALLERGIES:  is allergic to rituximab.  MEDICATIONS:  Current Outpatient Medications  Medication Sig Dispense Refill  . acetaminophen (TYLENOL) 500 MG tablet Take 650 mg by mouth daily as needed for mild pain or headache.     Marland Kitchen acyclovir (ZOVIRAX) 400 MG tablet Take 1 tablet (400 mg total) by mouth daily. 30 tablet 3  . amLODipine (NORVASC) 10 MG tablet Take 1 tablet (10 mg total) by mouth daily. 30 tablet 5  . aspirin EC 81 MG EC tablet Take 1 tablet (81 mg total) by mouth daily. 30 tablet 2  . hydrALAZINE (APRESOLINE) 25 MG tablet Take 1 tablet (25 mg total) by mouth 3 (three) times daily. 90 tablet 3  . lidocaine-prilocaine (EMLA) cream Apply 1 application topically as needed. (Patient not taking: Reported on 11/19/2017)  30 g 6  . lovastatin (MEVACOR) 40 MG tablet Take 40 mg by mouth at bedtime.     . metoprolol tartrate (LOPRESSOR) 25 MG tablet Take 1 tablet (25 mg total) by mouth 2 (two) times daily. 60 tablet 2  . montelukast (SINGULAIR) 10 MG tablet Take 10 mg by mouth at bedtime.  4  . omeprazole (PRILOSEC) 20 MG capsule Take 1 capsule (20 mg total) by mouth daily. (Patient not taking: Reported on 11/19/2017) 30 capsule 11  . ondansetron (ZOFRAN) 8 MG tablet Take 1 tablet (8 mg total) by mouth 2 (two) times daily as needed for refractory nausea / vomiting. Start on day 3 after chemotherapy. (Patient not taking: Reported on 11/19/2017) 30 tablet 1  . potassium chloride (K-DUR) 10 MEQ tablet Take 10 mEq by mouth daily.    . predniSONE (DELTASONE) 10 MG tablet Take 10 mg by mouth daily with breakfast.    . prochlorperazine (COMPAZINE) 10 MG tablet Take 1 tablet (10 mg total) by mouth every 6 (six) hours as needed (Nausea or vomiting). (Patient not taking: Reported on 11/19/2017) 30 tablet 1  . sertraline (ZOLOFT) 50 MG tablet Take 50 mg by mouth daily.     No current facility-administered medications for this visit.     PHYSICAL EXAMINATION: ECOG PERFORMANCE STATUS: 1 - Symptomatic but completely ambulatory  Vitals:   12/29/17 1308  BP: (!) 177/68  Pulse:  60  Resp: 18  Temp: 98 F (36.7 C)  SpO2: 98%   Filed Weights   12/29/17 1308  Weight: 149 lb 9.6 oz (67.9 kg)    GENERAL:alert, no distress and comfortable SKIN: skin color, texture, turgor are normal, no rashes or significant lesions EYES: normal, Conjunctiva are pink and non-injected, sclera clear OROPHARYNX:no exudate, no erythema and lips, buccal mucosa, and tongue normal  NECK: supple, thyroid normal size, non-tender, without nodularity LYMPH:  no palpable lymphadenopathy in the cervical, axillary or inguinal LUNGS: clear to auscultation and percussion with normal breathing effort HEART: regular rate & rhythm and no murmurs and no lower  extremity edema ABDOMEN:abdomen soft, non-tender and normal bowel sounds Musculoskeletal:no cyanosis of digits and no clubbing  NEURO: alert & oriented x 3 with fluent speech, no focal motor/sensory deficits  LABORATORY DATA:  I have reviewed the data as listed    Component Value Date/Time   NA 143 11/22/2017 0550   NA 138 02/17/2017 0810   K 3.5 11/22/2017 0550   K 3.7 02/17/2017 0810   CL 106 11/22/2017 0550   CO2 30 11/22/2017 0550   CO2 27 02/17/2017 0810   GLUCOSE 97 11/22/2017 0550   GLUCOSE 92 02/17/2017 0810   BUN 14 11/22/2017 0550   BUN 16.4 02/17/2017 0810   CREATININE 0.91 11/22/2017 0550   CREATININE 1.1 02/17/2017 0810   CALCIUM 8.9 11/22/2017 0550   CALCIUM 9.3 02/17/2017 0810   PROT 5.2 (L) 11/22/2017 0550   PROT 6.4 02/17/2017 0810   ALBUMIN 3.1 (L) 11/22/2017 0550   ALBUMIN 3.9 02/17/2017 0810   AST 13 (L) 11/22/2017 0550   AST 13 02/17/2017 0810   ALT 12 11/22/2017 0550   ALT 13 02/17/2017 0810   ALKPHOS 63 11/22/2017 0550   ALKPHOS 74 02/17/2017 0810   BILITOT 0.7 11/22/2017 0550   BILITOT 0.60 02/17/2017 0810   GFRNONAA 54 (L) 11/22/2017 0550   GFRAA >60 11/22/2017 0550    No results found for: SPEP, UPEP  Lab Results  Component Value Date   WBC 8.4 12/29/2017   NEUTROABS 2.0 12/29/2017   HGB 12.5 12/29/2017   HCT 39.1 12/29/2017   MCV 88.1 12/29/2017   PLT 232 12/29/2017      Chemistry      Component Value Date/Time   NA 143 11/22/2017 0550   NA 138 02/17/2017 0810   K 3.5 11/22/2017 0550   K 3.7 02/17/2017 0810   CL 106 11/22/2017 0550   CO2 30 11/22/2017 0550   CO2 27 02/17/2017 0810   BUN 14 11/22/2017 0550   BUN 16.4 02/17/2017 0810   CREATININE 0.91 11/22/2017 0550   CREATININE 1.1 02/17/2017 0810      Component Value Date/Time   CALCIUM 8.9 11/22/2017 0550   CALCIUM 9.3 02/17/2017 0810   ALKPHOS 63 11/22/2017 0550   ALKPHOS 74 02/17/2017 0810   AST 13 (L) 11/22/2017 0550   AST 13 02/17/2017 0810   ALT 12 11/22/2017  0550   ALT 13 02/17/2017 0810   BILITOT 0.7 11/22/2017 0550   BILITOT 0.60 02/17/2017 0810       All questions were answered. The patient knows to call the clinic with any problems, questions or concerns. No barriers to learning was detected.  I spent 15 minutes counseling the patient face to face. The total time spent in the appointment was 20 minutes and more than 50% was on counseling and review of test results  Heath Lark, MD 12/29/2017 5:55  PM

## 2017-12-29 NOTE — Assessment & Plan Note (Signed)
She continues to have poor memory but appears to be coping well in the assisted living facility. Her daughter is delighted to see that she is overall improving

## 2017-12-29 NOTE — Assessment & Plan Note (Signed)
She appears to be responding well to 10 mg of prednisone daily with improved in weight and less abdominal pain Due to significant decline in overall performance status and her memory, I recommend continue to same and I plan to see her back again in 6 months Due to significant overall improvement, and reluctance to pursue further systemic therapy, I recommend port removal and her daughter agrees.

## 2017-12-29 NOTE — Assessment & Plan Note (Signed)
She is improving on low-dose prednisone.  We will continue indefinitely.  She has gained some weight.

## 2017-12-29 NOTE — Assessment & Plan Note (Signed)
We discussed the importance of preventive care and reviewed the vaccination programs. She does not have any prior allergic reactions to influenza vaccination. She agrees to proceed with influenza vaccination today and we will administer it today at the clinic.  

## 2017-12-29 NOTE — Telephone Encounter (Signed)
Pt declined avs and calendar

## 2017-12-29 NOTE — Assessment & Plan Note (Signed)
She has poorly controlled hypertension.  She is currently being monitored closely by her primary care doctor and I will defer to them for further management.

## 2018-01-14 ENCOUNTER — Emergency Department (HOSPITAL_COMMUNITY): Payer: Medicare Other

## 2018-01-14 ENCOUNTER — Other Ambulatory Visit: Payer: Self-pay

## 2018-01-14 ENCOUNTER — Encounter (HOSPITAL_COMMUNITY): Payer: Self-pay

## 2018-01-14 ENCOUNTER — Emergency Department (HOSPITAL_COMMUNITY)
Admission: EM | Admit: 2018-01-14 | Discharge: 2018-01-14 | Disposition: A | Discharge status: Home / Self Care | Payer: Medicare Other | Attending: Emergency Medicine | Admitting: Emergency Medicine

## 2018-01-14 DIAGNOSIS — Z7982 Long term (current) use of aspirin: Secondary | ICD-10-CM | POA: Diagnosis not present

## 2018-01-14 DIAGNOSIS — J449 Chronic obstructive pulmonary disease, unspecified: Secondary | ICD-10-CM | POA: Insufficient documentation

## 2018-01-14 DIAGNOSIS — Z8673 Personal history of transient ischemic attack (TIA), and cerebral infarction without residual deficits: Secondary | ICD-10-CM | POA: Diagnosis not present

## 2018-01-14 DIAGNOSIS — N183 Chronic kidney disease, stage 3 (moderate): Secondary | ICD-10-CM | POA: Insufficient documentation

## 2018-01-14 DIAGNOSIS — Z79899 Other long term (current) drug therapy: Secondary | ICD-10-CM | POA: Insufficient documentation

## 2018-01-14 DIAGNOSIS — Z856 Personal history of leukemia: Secondary | ICD-10-CM | POA: Insufficient documentation

## 2018-01-14 DIAGNOSIS — Z87891 Personal history of nicotine dependence: Secondary | ICD-10-CM | POA: Diagnosis not present

## 2018-01-14 DIAGNOSIS — Y92129 Unspecified place in nursing home as the place of occurrence of the external cause: Secondary | ICD-10-CM | POA: Insufficient documentation

## 2018-01-14 DIAGNOSIS — R55 Syncope and collapse: Secondary | ICD-10-CM | POA: Diagnosis present

## 2018-01-14 DIAGNOSIS — Y999 Unspecified external cause status: Secondary | ICD-10-CM | POA: Diagnosis not present

## 2018-01-14 DIAGNOSIS — S99911A Unspecified injury of right ankle, initial encounter: Secondary | ICD-10-CM | POA: Diagnosis not present

## 2018-01-14 DIAGNOSIS — I129 Hypertensive chronic kidney disease with stage 1 through stage 4 chronic kidney disease, or unspecified chronic kidney disease: Secondary | ICD-10-CM | POA: Insufficient documentation

## 2018-01-14 DIAGNOSIS — Y939 Activity, unspecified: Secondary | ICD-10-CM | POA: Insufficient documentation

## 2018-01-14 DIAGNOSIS — I2510 Atherosclerotic heart disease of native coronary artery without angina pectoris: Secondary | ICD-10-CM | POA: Insufficient documentation

## 2018-01-14 DIAGNOSIS — W19XXXA Unspecified fall, initial encounter: Secondary | ICD-10-CM | POA: Diagnosis not present

## 2018-01-14 DIAGNOSIS — M25571 Pain in right ankle and joints of right foot: Secondary | ICD-10-CM

## 2018-01-14 LAB — CBC WITH DIFFERENTIAL/PLATELET
BASOS ABS: 0.1 10*3/uL (ref 0.0–0.1)
BASOS PCT: 1 %
EOS ABS: 0.1 10*3/uL (ref 0.0–0.5)
EOS PCT: 1 %
HCT: 39.6 % (ref 36.0–46.0)
Hemoglobin: 12.4 g/dL (ref 12.0–15.0)
Lymphocytes Relative: 44 %
Lymphs Abs: 4.1 10*3/uL — ABNORMAL HIGH (ref 0.7–4.0)
MCH: 27.7 pg (ref 26.0–34.0)
MCHC: 31.3 g/dL (ref 30.0–36.0)
MCV: 88.4 fL (ref 80.0–100.0)
Monocytes Absolute: 1.7 10*3/uL — ABNORMAL HIGH (ref 0.1–1.0)
Monocytes Relative: 18 %
NEUTROS PCT: 36 %
NRBC: 0 % (ref 0.0–0.2)
Neutro Abs: 3.4 10*3/uL (ref 1.7–7.7)
PLATELETS: 213 10*3/uL (ref 150–400)
RBC: 4.48 MIL/uL (ref 3.87–5.11)
RDW: 15.2 % (ref 11.5–15.5)
WBC MORPHOLOGY: ABNORMAL
WBC: 9.4 10*3/uL (ref 4.0–10.5)

## 2018-01-14 LAB — COMPREHENSIVE METABOLIC PANEL
ALT: 18 U/L (ref 0–44)
AST: 18 U/L (ref 15–41)
Albumin: 3.6 g/dL (ref 3.5–5.0)
Alkaline Phosphatase: 60 U/L (ref 38–126)
Anion gap: 10 (ref 5–15)
BUN: 14 mg/dL (ref 8–23)
CHLORIDE: 102 mmol/L (ref 98–111)
CO2: 30 mmol/L (ref 22–32)
CREATININE: 0.99 mg/dL (ref 0.44–1.00)
Calcium: 8.8 mg/dL — ABNORMAL LOW (ref 8.9–10.3)
GFR, EST AFRICAN AMERICAN: 57 mL/min — AB (ref >60)
GFR, EST NON AFRICAN AMERICAN: 49 mL/min — AB (ref >60)
Glucose, Bld: 101 mg/dL — ABNORMAL HIGH (ref 70–99)
POTASSIUM: 3.4 mmol/L — AB (ref 3.5–5.1)
SODIUM: 142 mmol/L (ref 135–145)
Total Bilirubin: 0.7 mg/dL (ref 0.3–1.2)
Total Protein: 6 g/dL — ABNORMAL LOW (ref 6.5–8.1)

## 2018-01-14 LAB — MAGNESIUM: MAGNESIUM: 1.9 mg/dL (ref 1.7–2.4)

## 2018-01-14 LAB — TROPONIN I

## 2018-01-14 NOTE — ED Notes (Signed)
Bed: RC38 Expected date:  Expected time:  Means of arrival:  Comments: EMS syncope and ankle pain

## 2018-01-14 NOTE — Discharge Instructions (Signed)
Your x-ray today showed no evidence of fracture dislocation of the ankle.  I suspect she sprained the ankle while pulling at her holding it in a position overnight as we discussed.  The knee x-ray also showed no fracture or dislocation.  The work-up was overall reassuring in regards to her syncopal episode I suspect it was similar to the prior episodes and given her well appearance during observation for several hours, we feel she is safe for discharge home.  Please have her follow-up with her PCP in several days and use the ankle brace.  If any symptoms change or worsen, please return to the nearest emergency department.  Please be careful not to have her pass out or fall.

## 2018-01-14 NOTE — ED Provider Notes (Signed)
[facility] DEPT Provider Note   CSN: [id number] Arrival date & time: 01/14/18  0913     History   Chief Complaint Chief Complaint  Patient presents with  . Fall  . Loss of Consciousness  . Ankle Pain    HPI Angy Swearengin is a 84 y.o. female.  The history is provided by the patient, the EMS personnel and medical records. No language interpreter was used.  Ankle Pain   The incident occurred 6 to 12 hours ago. The incident occurred at a nursing home. The injury mechanism was a fall (possible fall vs trapped between bed and wall). The pain is present in the left knee and right ankle. The quality of the pain is described as aching. The pain is severe. The pain has been constant since onset. Pertinent negatives include no numbness, no loss of motion, no loss of sensation and no tingling. She reports no foreign bodies present. The symptoms are aggravated by bearing weight and palpation. She has tried nothing for the symptoms. The treatment provided no relief.    Past Medical History:  Diagnosis Date  . Asthma   . CAD (coronary artery disease)   . Falls frequently   . Hyperlipidemia   . Hypertension     Patient Active Problem List   Diagnosis Date Noted  . Preventive measure 12/29/2017  . Other fatigue 09/25/2017  . Malnutrition of moderate degree 07/20/2017  . Enterocolitis 07/18/2017  . Pancytopenia, acquired ([place]) 07/14/2017  . Malignant cachexia ([place]) 06/04/2017  . Hypokalemia 04/29/2017  . Left arm pain 04/29/2017  . TIA (transient ischemic attack) 04/29/2017  . Abdominal pain 04/28/2017  . Elevated CK 04/01/2017  . AKI (acute kidney injury) ([place]) 03/31/2017  . Dehydration 03/31/2017  . Generalized weakness 03/31/2017  . External hemorrhoids without complication 11/94/1740  . Port-A-Cath in place 01/06/2017  . Gastritis 01/06/2017  . Poor memory 12/11/2016  . Dysuria 11/13/2016  . Anaphylaxis 10/29/2016  . Anaphylactoid reaction  10/29/2016  . Goals of care, counseling/discussion 10/28/2016  . Chronic kidney disease, stage III (moderate) ([place]) 10/17/2016  . Lymphoma, small lymphocytic ([place]) 10/15/2016  . Neutropenia ([place]) 09/23/2016  . CLL (chronic lymphocytic leukemia) ([place]) 05/11/2016  . Acute on chronic diastolic heart failure ([place]) 05/10/2016  . Influenza A 05/09/2016  . Lymphadenopathy of head and neck region 05/09/2016  . Lymphadenopathy, thoracic 05/09/2016  . COPD (chronic obstructive pulmonary disease) ([place]) 05/09/2016  . Debility 05/09/2016  . Syncopal episodes 05/09/2016  . Syncope 05/08/2016  . HTN (hypertension) 05/18/2013  . CAD in native artery 05/18/2013    Past Surgical History:  Procedure Laterality Date  . CATARACT EXTRACTION    . CORONARY ANGIOPLASTY  10 -15 years ago   stent in [place] ,[place]  . IR FLUORO GUIDE PORT INSERTION RIGHT  10/23/2016  . IR US GUIDE VASC ACCESS RIGHT  10/23/2016     OB History   None      Home Medications    Prior to Admission medications   Medication Sig Start Date End Date Taking? Authorizing Provider  acetaminophen (TYLENOL) 500 MG tablet Take 650 mg by mouth daily as needed for mild pain or headache.     [provider]  acyclovir (ZOVIRAX) 400 MG tablet Take 1 tablet (400 mg total) by mouth daily. 06/04/17   Heath Lark, MD  amLODipine (NORVASC) 10 MG tablet Take 1 tablet (10 mg total) by mouth daily. 11/24/17   Roxan Hockey, MD  aspirin EC 81 MG  EC tablet Take 1 tablet (81 mg total) by mouth daily. 05/02/17   Oswald Hillock, MD  hydrALAZINE (APRESOLINE) 25 MG tablet Take 1 tablet (25 mg total) by mouth 3 (three) times daily. 11/23/17 11/23/18  Roxan Hockey, MD  lidocaine-prilocaine (EMLA) cream Apply 1 application topically as needed. Patient not taking: Reported on 11/19/2017 10/27/16   Heath Lark, MD  lovastatin (MEVACOR) 40 MG tablet Take 40 mg by mouth at bedtime.  05/09/13   [provider]  metoprolol tartrate (LOPRESSOR) 25  MG tablet Take 1 tablet (25 mg total) by mouth 2 (two) times daily. 11/23/17   Roxan Hockey, MD  montelukast (SINGULAIR) 10 MG tablet Take 10 mg by mouth at bedtime. 05/15/17   [provider]  omeprazole (PRILOSEC) 20 MG capsule Take 1 capsule (20 mg total) by mouth daily. Patient not taking: Reported on 11/19/2017 01/06/17   Heath Lark, MD  ondansetron (ZOFRAN) 8 MG tablet Take 1 tablet (8 mg total) by mouth 2 (two) times daily as needed for refractory nausea / vomiting. Start on day 3 after chemotherapy. Patient not taking: Reported on 11/19/2017 06/04/17   Heath Lark, MD  potassium chloride (K-DUR) 10 MEQ tablet Take 10 mEq by mouth daily. 03/24/16   [provider]  predniSONE (DELTASONE) 10 MG tablet Take 10 mg by mouth daily with breakfast.    [provider]  prochlorperazine (COMPAZINE) 10 MG tablet Take 1 tablet (10 mg total) by mouth every 6 (six) hours as needed (Nausea or vomiting). Patient not taking: Reported on 11/19/2017 06/04/17   Heath Lark, MD  sertraline (ZOLOFT) 50 MG tablet Take 50 mg by mouth daily.    [provider]    Family History Family History  Problem Relation Age of Onset  . Cancer Father        colon    Social History Social History   Tobacco Use  . Smoking status: Former Smoker    Types: Cigarettes    Start date: 05/18/1983  . Smokeless tobacco: Never Used  Substance Use Topics  . Alcohol use: No  . Drug use: No     Allergies   Rituximab   Review of Systems Review of Systems  Constitutional: Negative for chills, diaphoresis, fatigue and fever.  HENT: Negative for congestion.   Eyes: Negative for visual disturbance.  Respiratory: Negative for cough, chest tightness, shortness of breath, wheezing and stridor.   Cardiovascular: Negative for chest pain and palpitations.  Gastrointestinal: Negative for abdominal pain, constipation, diarrhea, nausea and vomiting.  Genitourinary: Negative for flank pain.    Musculoskeletal: Negative for gait problem, neck pain and neck stiffness.  Neurological: Positive for syncope. Negative for dizziness, tingling, light-headedness, numbness and headaches.  All other systems reviewed and are negative.    Physical Exam Updated Vital Signs BP (!) 176/63   Pulse 72   Temp 98.1 F (36.7 C) (Oral)   Resp 18   SpO2 96%   Physical Exam  Constitutional: She is oriented to person, place, and time. She appears well-developed and well-nourished. No distress.  HENT:  Head: Normocephalic and atraumatic.  Mouth/Throat: Oropharynx is clear and moist.  Eyes: Pupils are equal, round, and reactive to light. Conjunctivae and EOM are normal.  Neck: Neck supple.  Cardiovascular: Normal rate and regular rhythm.  No murmur heard. Pulmonary/Chest: Effort normal and breath sounds normal. No respiratory distress. She has no wheezes. She has no rales. She exhibits no tenderness.  Abdominal: Soft. There is no tenderness.  Musculoskeletal: She exhibits tenderness. She exhibits no edema or deformity.       Left knee: She exhibits no swelling, no effusion and no laceration (abrasion). Tenderness found. Patellar tendon tenderness noted.       Right ankle: She exhibits decreased range of motion (pain lmiting) and swelling. She exhibits no deformity, no laceration and normal pulse. Tenderness.       Feet:  Normal sensation and pulses in both lower extremities.  Redness tenderness and swelling to the right ankle.  Abrasion and tenderness to the left knee.  Patient can wiggle toes equally bilaterally.  Neurological: She is alert and oriented to person, place, and time. No sensory deficit. She exhibits normal muscle tone.  Skin: Skin is warm and dry. Capillary refill takes less than 2 seconds. No rash noted. She is not diaphoretic. No erythema.  Psychiatric: She has a normal mood and affect.  Nursing note and vitals reviewed.    ED Treatments / Results  Labs (all labs ordered  are listed, but only abnormal results are displayed) Labs Reviewed  CBC WITH DIFFERENTIAL/PLATELET - Abnormal; Notable for the following components:      Result Value   Lymphs Abs 4.1 (*)    Monocytes Absolute 1.7 (*)    All other components within normal limits  COMPREHENSIVE METABOLIC PANEL - Abnormal; Notable for the following components:   Potassium 3.4 (*)    Glucose, Bld 101 (*)    Calcium 8.8 (*)    Total Protein 6.0 (*)    GFR calc non Af Amer 49 (*)    GFR calc Af Amer 57 (*)    All other components within normal limits  URINE CULTURE  MAGNESIUM  TROPONIN I  URINALYSIS, ROUTINE W REFLEX MICROSCOPIC    EKG EKG Interpretation  Date/Time:  Thursday January 14 2018 10:35:03 EDT Ventricular Rate:  73 PR Interval:    QRS Duration: 98 QT Interval:  474 QTC Calculation: 523 R Axis:   -23 Text Interpretation:  Sinus rhythm Borderline left axis deviation Abnormal R-wave progression, early transition Nonspecific T abnrm, anterolateral leads Prolonged QT interval When compared to prior, longer QTc and more wandering baseline.  No STEMI Confirmed by Antony Blackbird [phone]) on 01/14/2018 11:16:47 AM Also confirmed by Antony Blackbird [phone]), editor Philomena Doheny [phone])  on 01/14/2018 2:21:08 PM   Radiology Dg Ankle Complete Right  Result Date: 01/14/2018 CLINICAL DATA:  Unwitnessed fall beside bed after a syncopal episode, RIGHT ankle pain and swelling, redness and swelling at medial RIGHT ankle, initial encounter EXAM: RIGHT ANKLE - COMPLETE 3+ VIEW COMPARISON:  None FINDINGS: Osseous demineralization. Joint spaces preserved. No acute fracture, dislocation, or bone destruction. Small vessel vascular calcifications at ankle. IMPRESSION: No acute osseous abnormalities. Electronically Signed   By: Lavonia Dana M.D.   On: 01/14/2018 11:44   Dg Knee Complete 4 Views Left  Result Date: 01/14/2018 CLINICAL DATA:  Unwitnessed fall beside bed after a syncopal episode, RIGHT ankle pain and  swelling, redness and swelling at medial RIGHT ankle, initial encounter EXAM: LEFT KNEE - COMPLETE 4+ VIEW COMPARISON:  None FINDINGS: Osseous demineralization. Scattered joint space narrowing and chondrocalcinosis. No acute fracture, dislocation, or bone destruction. No knee joint effusion. Extensive atherosclerotic calcifications of distal superficial femoral and popliteal arteries. IMPRESSION: Osseous demineralization with degenerative changes and question CPPD LEFT knee. No acute abnormalities. Electronically Signed   By: Lavonia Dana M.D.   On: 01/14/2018 11:45    Procedures Procedures (including critical care time)  Medications  Ordered in ED Medications - No data to display   Initial Impression / Assessment and Plan / ED Course  I have reviewed the triage vital signs and the nursing notes.  Pertinent labs & imaging results that were available during my care of the patient were reviewed by me and considered in my medical decision making (see chart for details).     Shalie Schremp is a 84 y.o. female with a past medical history significant for CAD status post PCI, CHF, CKD, TIA, hyperlipidemia, and CLL on chronic steroid therapy who presents with right ankle pain/swelling and a syncopal episode with left knee pain from the fall.  Patient is calmly by her daughter who helps corroborate information.  According to EMS report and daughter report, patient had right ankle pain overnight into this morning.  Patient think she may have gotten her right leg stuck in between the wall and the bed.  She does not think that she fell injuring the right ankle and was having pain when she spoke to her daughter this morning.  Patient then says she was trying to walk in her facility when she had a syncopal episode.  She reports no palpitations or chest pain before the fall but reports gradually falling to the ground striking her left knee on the ground and then passing out.  She reports that she was out for  approximately 10 minutes before a bystander found her.  She denies any headache, vision changes, neck pain, nausea, vomiting, chest pain, shortness of breath, palpitations, abdominal pain, or back pain.  Her primary concern is the pain in her right ankle which she was having before the fall and then the left knee pain and abrasion which she sustained during the syncopal episode/fall.  She reports that she is been feeling well over the last few days and is otherwise been her normal self.  No recent medication changes by patient report.    On exam, patient has small abrasion to her left knee.  There is tenderness on the patella.  Normal knee range of motion normal sensation strength in the foot.  His right ankle is tender red and swollen.  It is tender with any ankle movement or palpation.  Normal pulse and sensation distally.  Normal toe movement.  No knee tenderness on the right.  Exam otherwise unremarkable with clear lungs, chest nontender abdomen nontender.  Normal finger-nose-finger testing.  Gait was not tested due to patient's pain with weightbearing and inability to stand on her right ankle.  Had a shared decision making with patient's daughter about further work-up.  Patient was recently admitted a month and a half ago for syncopal episode and had a reassuring work-up.  Patient can follow-up with her PCP for the syncope.  We agreed to get some screening blood work and urine testing to look for significant ab normality that might contribute to her syncopal episode this morning however her primary focus will be looking for injuries related to the ankle and the left knee.  Patient will have x-rays of these locations and will be given pain medication to help with her discomfort.  Next  If work-up is reassuring, anticipate discharge with plans for outpatient follow-up.  X-ray showed no evidence of fracture dislocation of the knee or the ankle.  Suspect sprain.  Patient placed in ASO brace.  Patient's pain  improved after medications.  Labs are otherwise reassuring.  All labs were discussed with patient's daughter.  They feel comfortable having the patient  follow-up with her PCP for the syncopal episode and go home today.  Patient will be discharged with instructions for minimal weightbearing at home, PCP follow-up, and instructions to stay hydrated.  Patient and family had no depressions or concerns and patient discharged in good condition.    Final Clinical Impressions(s) / ED Diagnoses   Final diagnoses:  Syncope, unspecified syncope type  Acute right ankle pain    ED Discharge Orders    None      Clinical Impression: 1. Syncope, unspecified syncope type   2. Acute right ankle pain     Disposition: Discharge  Condition: Good  I have discussed the results, Dx and Tx plan with the pt(& family if present). He/she/they expressed understanding and agree(s) with the plan. Discharge instructions discussed at great length. Strict return precautions discussed and pt &/or family have verbalized understanding of the instructions. No further questions at time of discharge.    Discharge Medication List as of 01/14/2018  2:43 PM      Follow Up: Lujean Amel, MD [address] [place] [address] [facility] DEPT [facility] [id number] mc [address] [place]       Tegeler, Gwenyth Allegra, MD 01/14/18 [id number]

## 2018-01-14 NOTE — ED Triage Notes (Signed)
EMS reports from [facility], unwitnessed fall beside bed after syncopal episode. Upon EMS arrival Pt a&O x 4 c/o right ankle pain and swelling. Pt recalls losing consciousness.  20ga RAC 20 mcgm Fentanyl enroute  BP 143/72 HR 68 RR 16 Sp02 95 RA CBG 151

## 2018-01-14 NOTE — ED Notes (Signed)
Put patient on external cath.

## 2018-02-04 ENCOUNTER — Other Ambulatory Visit: Payer: Self-pay | Admitting: Radiology

## 2018-02-05 ENCOUNTER — Ambulatory Visit (HOSPITAL_COMMUNITY)
Admission: RE | Admit: 2018-02-05 | Discharge: 2018-02-05 | Disposition: A | Discharge status: Home / Self Care | Payer: Medicare Other | Source: Ambulatory Visit | Attending: Hematology and Oncology | Admitting: Hematology and Oncology

## 2018-02-05 ENCOUNTER — Other Ambulatory Visit: Payer: Self-pay

## 2018-02-05 ENCOUNTER — Encounter (HOSPITAL_COMMUNITY): Payer: Self-pay

## 2018-02-05 DIAGNOSIS — Z452 Encounter for adjustment and management of vascular access device: Secondary | ICD-10-CM | POA: Insufficient documentation

## 2018-02-05 DIAGNOSIS — C9110 Chronic lymphocytic leukemia of B-cell type not having achieved remission: Secondary | ICD-10-CM

## 2018-02-05 HISTORY — PX: IR REMOVAL TUN ACCESS W/ PORT W/O FL MOD SED: IMG2290

## 2018-02-05 LAB — CBC WITH DIFFERENTIAL/PLATELET
ABS IMMATURE GRANULOCYTES: 0.1 10*3/uL — AB (ref 0.00–0.07)
Basophils Absolute: 0.1 10*3/uL (ref 0.0–0.1)
Basophils Relative: 1 %
EOS ABS: 0.3 10*3/uL (ref 0.0–0.5)
Eosinophils Relative: 1 %
HCT: 42.8 % (ref 36.0–46.0)
HEMOGLOBIN: 13.1 g/dL (ref 12.0–15.0)
IMMATURE GRANULOCYTES: 0 %
LYMPHS ABS: 9.5 10*3/uL — AB (ref 0.7–4.0)
Lymphocytes Relative: 41 %
MCH: 26.9 pg (ref 26.0–34.0)
MCHC: 30.6 g/dL (ref 30.0–36.0)
MCV: 87.9 fL (ref 80.0–100.0)
MONOS PCT: 38 %
Monocytes Absolute: 8.6 10*3/uL — ABNORMAL HIGH (ref 0.1–1.0)
NEUTROS ABS: 4.4 10*3/uL (ref 1.7–7.7)
NEUTROS PCT: 19 %
Platelets: 313 10*3/uL (ref 150–400)
RBC: 4.87 MIL/uL (ref 3.87–5.11)
RDW: 14.6 % (ref 11.5–15.5)
WBC: 23 10*3/uL — ABNORMAL HIGH (ref 4.0–10.5)
nRBC: 0 % (ref 0.0–0.2)

## 2018-02-05 LAB — BASIC METABOLIC PANEL
Anion gap: 9 (ref 5–15)
BUN: 18 mg/dL (ref 8–23)
CALCIUM: 9.2 mg/dL (ref 8.9–10.3)
CO2: 28 mmol/L (ref 22–32)
CREATININE: 1.2 mg/dL — AB (ref 0.44–1.00)
Chloride: 99 mmol/L (ref 98–111)
GFR calc Af Amer: 45 mL/min — ABNORMAL LOW (ref >60)
GFR calc non Af Amer: 39 mL/min — ABNORMAL LOW (ref >60)
GLUCOSE: 101 mg/dL — AB (ref 70–99)
Potassium: 3.7 mmol/L (ref 3.5–5.1)
Sodium: 136 mmol/L (ref 135–145)

## 2018-02-05 LAB — PROTIME-INR
INR: 0.9
PROTHROMBIN TIME: 12 s (ref 11.4–15.2)

## 2018-02-05 MED ORDER — MIDAZOLAM HCL 2 MG/2ML IJ SOLN
INTRAMUSCULAR | Status: AC | PRN
  Administered 2018-02-05 (×2): 0.5 mg via INTRAVENOUS
  Administered 2018-02-05: 1 mg via INTRAVENOUS

## 2018-02-05 MED ORDER — FENTANYL CITRATE (PF) 100 MCG/2ML IJ SOLN
INTRAMUSCULAR | Status: AC | PRN
  Administered 2018-02-05: 25 ug via INTRAVENOUS
  Administered 2018-02-05: 50 ug via INTRAVENOUS
  Administered 2018-02-05: 25 ug via INTRAVENOUS

## 2018-02-05 MED ORDER — MIDAZOLAM HCL 2 MG/2ML IJ SOLN
INTRAMUSCULAR | Status: AC
  Filled 2018-02-05: qty 2

## 2018-02-05 MED ORDER — CEFAZOLIN SODIUM-DEXTROSE 2-4 GM/100ML-% IV SOLN
INTRAVENOUS | Status: AC
  Administered 2018-02-05: 2 g via INTRAVENOUS
  Filled 2018-02-05: qty 100

## 2018-02-05 MED ORDER — LIDOCAINE HCL 1 % IJ SOLN
INTRAMUSCULAR | Status: AC
  Filled 2018-02-05: qty 20

## 2018-02-05 MED ORDER — SODIUM CHLORIDE 0.9 % IV SOLN
INTRAVENOUS | Status: DC
  Administered 2018-02-05: 12:00:00 via INTRAVENOUS

## 2018-02-05 MED ORDER — CEFAZOLIN SODIUM-DEXTROSE 2-4 GM/100ML-% IV SOLN
2.0000 g | INTRAVENOUS | Status: AC
  Administered 2018-02-05: 2 g via INTRAVENOUS

## 2018-02-05 MED ORDER — FENTANYL CITRATE (PF) 100 MCG/2ML IJ SOLN
INTRAMUSCULAR | Status: AC
  Filled 2018-02-05: qty 2

## 2018-02-05 MED ORDER — LIDOCAINE HCL (PF) 1 % IJ SOLN
INTRAMUSCULAR | Status: AC | PRN
  Administered 2018-02-05: 10 mL

## 2018-02-05 NOTE — H&P (Signed)
Referring Physician(s): Heath Lark  Supervising Physician: Aletta Edouard  Patient Status:  [facility]  Chief Complaint:  "I'm getting my port out"  Subjective: Patient familiar to IR service from prior Port-A-Cath placement on 10/23/2016. She has a history of CLL and has completed treatment.  She no longer needs the Port-A-Cath and presents today for port removal.  She denies fever, headache, chest pain, dyspnea, cough, abdominal/back pain, nausea, vomiting or bleeding.  Past Medical History:  Diagnosis Date  . Asthma   . CAD (coronary artery disease)   . Falls frequently   . Hyperlipidemia   . Hypertension    Past Surgical History:  Procedure Laterality Date  . CATARACT EXTRACTION    . CORONARY ANGIOPLASTY  10 -15 years ago   stent in [place] ,[place]  . IR FLUORO GUIDE PORT INSERTION RIGHT  10/23/2016  . IR US GUIDE VASC ACCESS RIGHT  10/23/2016      Allergies: Rituximab  Medications: Prior to Admission medications   Medication Sig Start Date End Date Taking? Authorizing Provider  acetaminophen (TYLENOL) 500 MG tablet Take 650 mg by mouth daily as needed for mild pain or headache.    Yes [provider]  acyclovir (ZOVIRAX) 400 MG tablet Take 1 tablet (400 mg total) by mouth daily. 06/04/17  Yes Gorsuch, Ni, MD  amLODipine (NORVASC) 10 MG tablet Take 1 tablet (10 mg total) by mouth daily. 11/24/17  Yes Emokpae, Courage, MD  aspirin EC 81 MG EC tablet Take 1 tablet (81 mg total) by mouth daily. 05/02/17  Yes Oswald Hillock, MD  hydrALAZINE (APRESOLINE) 25 MG tablet Take 1 tablet (25 mg total) by mouth 3 (three) times daily. 11/23/17 11/23/18 Yes Emokpae, Courage, MD  lovastatin (MEVACOR) 40 MG tablet Take 40 mg by mouth at bedtime.  05/09/13  Yes [provider]  metoprolol tartrate (LOPRESSOR) 25 MG tablet Take 1 tablet (25 mg total) by mouth 2 (two) times daily. 11/23/17  Yes Emokpae, Courage, MD  montelukast (SINGULAIR) 10 MG tablet Take 10 mg by mouth at  bedtime. 05/15/17  Yes [provider]  omeprazole (PRILOSEC) 20 MG capsule Take 1 capsule (20 mg total) by mouth daily. 01/06/17  Yes Gorsuch, Ni, MD  ondansetron (ZOFRAN) 8 MG tablet Take 1 tablet (8 mg total) by mouth 2 (two) times daily as needed for refractory nausea / vomiting. Start on day 3 after chemotherapy. Patient taking differently: Take 8 mg by mouth every 12 (twelve) hours as needed for nausea or vomiting.  06/04/17  Yes Gorsuch, Ni, MD  potassium chloride (K-DUR) 10 MEQ tablet Take 10 mEq by mouth daily. 03/24/16  Yes [provider]  predniSONE (DELTASONE) 10 MG tablet Take 10 mg by mouth daily with breakfast.   Yes [provider]  prochlorperazine (COMPAZINE) 10 MG tablet Take 1 tablet (10 mg total) by mouth every 6 (six) hours as needed (Nausea or vomiting). 06/04/17  Yes Gorsuch, Ni, MD  QUEtiapine (SEROQUEL) 25 MG tablet Take 12.5 mg by mouth at bedtime.   Yes [provider]  sertraline (ZOLOFT) 50 MG tablet Take 50 mg by mouth daily.   Yes [provider]  lidocaine-prilocaine (EMLA) cream Apply 1 application topically as needed. Patient taking differently: Apply 1 application topically as needed (port).  10/27/16   Heath Lark, MD     Vital Signs: BP (!) 181/74 (BP Location: Right Arm)   Pulse 63   Temp 97.6 F (36.4 C) (Oral)   Resp 16  SpO2 98%   Physical Exam awake, alert, some mild memory impairment.  Chest clear to auscultation bilaterally.  Clean, intact right chest wall Port-A-Cath.  Heart with regular rate and rhythm.  Abdomen soft, positive bowel sounds, nontender.  No lower extremity edema.  Imaging: No results found.  Labs:  CBC: Recent Labs    11/22/17 0550 12/29/17 1231 01/14/18 1024 02/05/18 1206  WBC 10.0 8.4 9.4 23.0*  HGB 12.0 12.5 12.4 13.1  HCT 38.4 39.1 39.6 42.8  PLT 196 232 213 313    COAGS: Recent Labs    04/29/17 1100 11/22/17 0550 02/05/18 1206  INR 1.06 1.04 0.90  APTT 24 28  --      BMP: Recent Labs    11/21/17 1539 11/22/17 0550 01/14/18 1024 02/05/18 1206  NA 140 143 142 136  K 4.0 3.5 3.4* 3.7  CL 104 106 102 99  CO2 28 30 30 28   GLUCOSE 130* 97 101* 101*  BUN 21 14 14 18   CALCIUM 9.0 8.9 8.8* 9.2  CREATININE 1.37* 0.91 0.99 1.20*  GFRNONAA 33* 54* 49* 39*  GFRAA 38* >60 57* 45*    LIVER FUNCTION TESTS: Recent Labs    09/25/17 1143 11/19/17 1414 11/22/17 0550 01/14/18 1024  BILITOT 0.3 0.5 0.7 0.7  AST 13 17 13* 18  ALT 11 13 12 18   ALKPHOS 64 62 63 60  PROT 6.0* 5.9* 5.2* 6.0*  ALBUMIN 3.7 3.7 3.1* 3.6    Assessment and Plan: Pt with history of CLL and has completed treatment.  She no longer needs her Port-A-Cath and presents today for port removal.  Details/risks of procedure, including but not limited to, internal bleeding, infection, injury to adjacent structures discussed with patient and daughter with their understanding and consent.   Electronically Signed: D. Rowe Robert, PA-C 02/05/2018, 1:32 PM   I spent a total of 20 minutes at the the patient's bedside AND on the patient's hospital floor or unit, greater than 50% of which was counseling/coordinating care for port a cath removal

## 2018-02-05 NOTE — Discharge Instructions (Signed)
Moderate Conscious Sedation, Adult, Care After These instructions provide you with information about caring for yourself after your procedure. Your health care provider may also give you more specific instructions. Your treatment has been planned according to current medical practices, but problems sometimes occur. Call your health care provider if you have any problems or questions after your procedure. What can I expect after the procedure? After your procedure, it is common:  To feel sleepy for several hours.  To feel clumsy and have poor balance for several hours.  To have poor judgment for several hours.  To vomit if you eat too soon.  Follow these instructions at home: For at least 24 hours after the procedure:   Do not: ? Participate in activities where you could fall or become injured. ? Drive. ? Use heavy machinery. ? Drink alcohol. ? Take sleeping pills or medicines that cause drowsiness. ? Make important decisions or sign legal documents. ? Take care of children on your own.  Rest. Eating and drinking  Follow the diet recommended by your health care provider.  If you vomit: ? Drink water, juice, or soup when you can drink without vomiting. ? Make sure you have little or no nausea before eating solid foods. General instructions  Have a responsible adult stay with you until you are awake and alert.  Take over-the-counter and prescription medicines only as told by your health care provider.  If you smoke, do not smoke without supervision.  Keep all follow-up visits as told by your health care provider. This is important. Contact a health care provider if:  You keep feeling nauseous or you keep vomiting.  You feel light-headed.  You develop a rash.  You have a fever. Get help right away if:  You have trouble breathing. This information is not intended to replace advice given to you by your health care provider. Make sure you discuss any questions you have  with your health care provider. Document Released: 01/12/2013 Document Revised: 08/27/2015 Document Reviewed: 07/14/2015 Elsevier Interactive Patient Education  2018 [place] C[place] Removal, Care After Refer to this sheet in the next few weeks. These instructions provide you with information about caring for yourself after your procedure. Your health care provider may also give you more specific instructions. Your treatment has been planned according to current medical practices, but problems sometimes occur. Call your health care provider if you have any problems or questions after your procedure. What can I expect after the procedure? After the procedure, it is common to have:  Soreness or pain near your incision.  Some swelling or bruising near your incision.  Follow these instructions at home: Medicines  Take over-the-counter and prescription medicines only as told by your health care provider.  If you were prescribed an antibiotic medicine, take it as told by your health care provider. Do not stop taking the antibiotic even if you start to feel better. Bathing  Do not take baths, swim, or use a hot tub until your health care provider approves. Ask your health care provider if you can take showers. You may only be allowed to take sponge baths for bathing.  You may shower tomorrow 02-06-18 after 3 pm Incision care  Follow instructions from your health care provider about how to take care of your incision. Make sure you: ? Wash your hands with soap and water before you change your bandage (dressing). If soap and water are not available, use hand sanitizer. ? Change your dressing  as told by your health care provider.  You may remove your dressing tomorrow 02-06-18 after 3 pm ? Keep your dressing dry. ? Leave skin glue in place. These skin closures may need to stay in place for 2 weeks or longer. If adhesive strip edges start to loosen and curl up, you may trim the loose  edges. Do not remove adhesive strips completely unless your health care provider tells you to do that.  Check your incision area every day for signs of infection. Check for: ? More redness, swelling, or pain. ? More fluid or blood. ? Warmth. ? Pus or a bad smell. Driving  If you received a sedative, do not drive for 24 hours after the procedure.  If you did not receive a sedative, ask your health care provider when it is safe to drive. Activity  Return to your normal activities as told by your health care provider. Ask your health care provider what activities are safe for you.  Until your health care provider says it is safe: ? Do not lift anything that is heavier than 10 lb (4.5 kg). ? Do not do activities that involve lifting your arms over your head. General instructions  Do not use any tobacco products, such as cigarettes, chewing tobacco, and e-cigarettes. Tobacco can delay healing. If you need help quitting, ask your health care provider.  Keep all follow-up visits as told by your health care provider. This is important. Contact a health care provider if:  You have more redness, swelling, or pain around your incision.  You have more fluid or blood coming from your incision.  Your incision feels warm to the touch.  You have pus or a bad smell coming from your incision.  You have a fever.  You have pain that is not relieved by your pain medicine. Get help right away if:  You have chest pain.  You have difficulty breathing. This information is not intended to replace advice given to you by your health care provider. Make sure you discuss any questions you have with your health care provider. Document Released: 03/05/2015 Document Revised: 08/30/2015 Document Reviewed: 12/27/2014 Elsevier Interactive Patient Education  [employer].

## 2018-02-05 NOTE — Procedures (Signed)
Interventional Radiology Procedure Note  Procedure: Removal of port  Complications: None  Estimated Blood Loss: < 10 mL  Findings: Right chest port removed in entirety. Wound closed.  Venetia Night. Kathlene Cote, M.D Pager:  [phone]

## 2018-03-22 ENCOUNTER — Telehealth: Payer: Self-pay | Admitting: Hematology and Oncology

## 2018-03-22 ENCOUNTER — Telehealth: Payer: Self-pay

## 2018-03-22 NOTE — Telephone Encounter (Signed)
Scheduled appt per 12/16 sch message - left message for daughter with appt date and time

## 2018-03-22 NOTE — Telephone Encounter (Signed)
Called and left below message. Scheduling message sent.

## 2018-03-22 NOTE — Telephone Encounter (Signed)
Daughter called and left a message. Her Mom has a history of CLL.  She is complaining of abdominal pain like she did in the past when she had swollen lymph nodes. Requesting appt.

## 2018-03-22 NOTE — Telephone Encounter (Signed)
pls send scheduling msg for labs and 30 mins appt next week

## 2018-03-29 ENCOUNTER — Inpatient Hospital Stay: Payer: Medicare Other | Attending: Hematology and Oncology | Admitting: Hematology and Oncology

## 2018-03-29 ENCOUNTER — Inpatient Hospital Stay: Payer: Medicare Other

## 2018-03-29 ENCOUNTER — Telehealth: Payer: Self-pay | Admitting: Hematology and Oncology

## 2018-03-29 ENCOUNTER — Encounter: Payer: Self-pay | Admitting: Hematology and Oncology

## 2018-03-29 DIAGNOSIS — C9110 Chronic lymphocytic leukemia of B-cell type not having achieved remission: Secondary | ICD-10-CM | POA: Insufficient documentation

## 2018-03-29 DIAGNOSIS — Z9221 Personal history of antineoplastic chemotherapy: Secondary | ICD-10-CM | POA: Diagnosis not present

## 2018-03-29 DIAGNOSIS — Z79899 Other long term (current) drug therapy: Secondary | ICD-10-CM

## 2018-03-29 DIAGNOSIS — R1013 Epigastric pain: Secondary | ICD-10-CM | POA: Diagnosis not present

## 2018-03-29 DIAGNOSIS — Z95828 Presence of other vascular implants and grafts: Secondary | ICD-10-CM

## 2018-03-29 DIAGNOSIS — R1033 Periumbilical pain: Secondary | ICD-10-CM

## 2018-03-29 DIAGNOSIS — Z7982 Long term (current) use of aspirin: Secondary | ICD-10-CM | POA: Insufficient documentation

## 2018-03-29 LAB — CBC WITH DIFFERENTIAL/PLATELET
Basophils Absolute: 0 10*3/uL (ref 0.0–0.1)
Basophils Relative: 0 %
EOS PCT: 1 %
Eosinophils Absolute: 0.2 10*3/uL (ref 0.0–0.5)
HEMATOCRIT: 39.8 % (ref 36.0–46.0)
HEMOGLOBIN: 12.6 g/dL (ref 12.0–15.0)
LYMPHS ABS: 20.9 10*3/uL — AB (ref 0.7–4.0)
LYMPHS PCT: 87 %
MCH: 27.4 pg (ref 26.0–34.0)
MCHC: 31.7 g/dL (ref 30.0–36.0)
MCV: 86.5 fL (ref 80.0–100.0)
MYELOCYTES: 1 %
Monocytes Absolute: 0.5 10*3/uL (ref 0.1–1.0)
Monocytes Relative: 2 %
NEUTROS ABS: 2.4 10*3/uL (ref 1.7–7.7)
NRBC: 0 % (ref 0.0–0.2)
Neutrophils Relative %: 9 %
Platelets: 293 10*3/uL (ref 150–400)
RBC: 4.6 MIL/uL (ref 3.87–5.11)
RDW: 15.3 % (ref 11.5–15.5)
WBC: 24 10*3/uL — ABNORMAL HIGH (ref 4.0–10.5)

## 2018-03-29 LAB — URIC ACID: Uric Acid, Serum: 4.9 mg/dL (ref 2.5–7.1)

## 2018-03-29 MED ORDER — PREDNISONE 5 MG PO TABS: 5.0000 mg | ORAL_TABLET | Freq: Every day | ORAL | 9 refills | Status: DC

## 2018-03-29 MED ORDER — FAMOTIDINE 20 MG PO TABS: 20.0000 mg | ORAL_TABLET | Freq: Two times a day (BID) | ORAL | 9 refills | Status: DC

## 2018-03-29 NOTE — Assessment & Plan Note (Signed)
She has recurrence of abdominal pain, could be due to gastritis I recommend reducing the dose of prednisone and to add Pepcid in the evening Both prescriptions were given to her daughter We also discussed potential referral to GI service or repeat CT scan but the present time, her daughter would like to try conservative management first I plan to see her again in a month for further follow-up

## 2018-03-29 NOTE — Assessment & Plan Note (Signed)
She has recurrence of abdominal pain in the epigastric region which I do not think is related to CLL It could be caused by prednisone therapy I recommend reducing prednisone to 5 mg daily and reassess in the month

## 2018-03-29 NOTE — Progress Notes (Signed)
[facility] OFFICE PROGRESS NOTE  Patient Care Team: Koirala, Dibas, MD as PCP - General (Family Medicine) Heath Lark, MD as Consulting Physician (Hematology and Oncology)  ASSESSMENT & PLAN:  CLL (chronic lymphocytic leukemia) ([place]) She has recurrence of abdominal pain in the epigastric region which I do not think is related to CLL It could be caused by prednisone therapy I recommend reducing prednisone to 5 mg daily and reassess in the month  Abdominal pain She has recurrence of abdominal pain, could be due to gastritis I recommend reducing the dose of prednisone and to add Pepcid in the evening Both prescriptions were given to her daughter We also discussed potential referral to GI service or repeat CT scan but the present time, her daughter would like to try conservative management first I plan to see her again in a month for further follow-up   No orders of the defined types were placed in this encounter.   INTERVAL HISTORY: Please see below for problem oriented charting. She returns with her daughter for further follow-up The patient is currently in an assisted living facility She has been complaining of more frequent epigastric pain that comes and goes She has gained some weight since last time I saw her Denies recent changes in bowel habits  SUMMARY OF ONCOLOGIC HISTORY:   CLL (chronic lymphocytic leukemia) ([place])   05/02/2013 Initial Diagnosis    She was found to have abnormal CBC from at least since 2015. According to the records from her primary care doctor that were faxed to the [facility], she had leukocytosis at least since January 2015. On 05/02/2013, total white blood cell count was 28.3, normal hemoglobin 13.4 and platelet count 239,000. She had predominantly Lymphocytosis. On 06/20/2013, a CBC show white count of 26.7, hemoglobin 12.9 and platelet count of 299,000 On admission, on 05/08/2016, white blood cell count was 38.4, hemoglobin 12.1  and platelet count of 210. Differential show predominantly lymphocytosis.    05/08/2016 Imaging    CT scan of chest 1. Negative for acute pulmonary embolism 2. Numerous pathologic nodes in the mediastinum, hila, axillae, supraclavicular regions and upper abdomen. The adenopathy is suspicious for malignancy such as lymphoma or CLL. If tissue diagnosis is desired, many of the supraclavicular and axillary nodes should be palpable and accessible to excisional biopsy. 3. Low-attenuation liver lesions, incompletely imaged and not characterized but more likely benign. 4. Small hiatal hernia.    09/22/2016 Pathology Results    Peripheral Blood Flow Cytometry - CHRONIC LYMPHOCYTIC LEUKEMIA. - SEE COMMENT. Diagnosis Comment: There is a population of monoclonal B-cells with expression of CD5 and CD23. The phenotype is consistent with chronic lymphocytic leukemia    09/22/2016 Pathology Results    FISH confirmed trisomy 12    10/21/2016 PET scan    1. Extensive adenopathy in the neck, chest, abdomen, and pelvis as detailed above, primarily Deauville 3 and Deauville 4. Mild splenomegaly. 2. High activity in some soft tissue prominence along the anal region, possibly physiologic or due to hemorrhoidal activity or urinary incontinence, but correlation with anorectal exam recommended. 3. Focal abnormal accentuated high activity in the sigmoid colon along a region of abnormal stranding and diverticulosis, suspicious for active diverticulitis. Correlation with the patient's colon cancer screening history is recommended. If screening is not up-to-date, appropriate screening should be considered. 4. Aortic Atherosclerosis (ICD10-I70.0). Coronary atherosclerosis. 5. An exophytic high density lesion from the right kidney upper pole does not appear to be hypermetabolic and accordingly is most likely  to be a benign complex cyst, but this lesion is not fully characterized on today' s exam. If the patient has hematuria  or if otherwise clinically warranted, renal protocol MRI or CT examination could be utilized. 6. Some of the smaller hepatic lesions are nonspecific, but the larger hepatic lesions are photopenic and likely cysts.    10/23/2016 Procedure    Placement of single lumen port a cath via right internal jugular vein. The catheter tip lies at the cavo-atrial junction. A power injectable port a cath was placed and is ready for immediate use    10/29/2016 - 10/30/2016 Hospital Admission    She was admitted after allergic reaction to Rituximab    10/29/2016 Adverse Reaction    She received Rituximab complicated by allergic reaction. Treatment was abandoned    11/20/2016 - 04/28/2017 Chemotherapy    The patient is started on chlorambucil and low dose prednisone    04/29/2017 - 05/01/2017 Hospital Admission    She was admitted to the hospital for management of abdominal pain and weakness.    04/30/2017 Imaging    1. Mixed appearance with regard to the extensive nodal disease in the chest, abdomen, and pelvis. Areas of improved adenopathy include the supraclavicular, axillary, and periaortic adenopathy. Areas of worsening include the subcarinal and right inguinal adenopathy. Some of the nodes are very similar to their size on prior PET-CT from 10/21/2016. 2. There is advanced sigmoid diverticulosis and just cephalad to this, there is a region of nodularity and mesenteric infiltration which has progressed compared to 10/21/2016. This may well represent an infiltrative component of malignancy rather than necessarily being due to chronic diverticulitis. No extraluminal gas is identified. No abscess noted. 3. Upper normal splenic size. There are several hypodense nonspecific splenic lesions. These were not readily appreciable on prior noncontrast images, but no associated hypermetabolic activity was previously seen. 4. Hyperdense 1.9 cm right kidney upper pole lesion is not entirely specific. This is probably a complex  cyst. Attention on follow up studies suggested. 5. Transverse sclerosis in the sternal body compatible with late phase healing fracture. This was not present on the prior PET-CT from 10/21/2016. Reviewing the patient's records, she apparently received CPR after an anaphylactic reaction to chemotherapy on or around 10/29/2016, and it is possible (although speculative) that this represents residua from sternal trauma during CPR. Correlate with any extended soreness in the chest after that episode. 6. Other imaging findings of potential clinical significance: Aortic Atherosclerosis (ICD10-I70.0). Coronary atherosclerosis. Airway thickening is present, suggesting bronchitis or reactive airways disease. Scattered hepatic cysts. Multilevel lumbar impingement due to spondylosis and degenerative disc disease.     07/18/2017 - 07/23/2017 Hospital Admission    She was admitted to the hospital for enterocolitis    07/18/2017 Imaging    Ct abdomen and pelvis Acute inflammatory changes of the descending colon and sigmoid colon, compatible with acute diverticulitis/neutropenic enterocolitis in this patient with leukopenia.  Extensive lymph nodes within the abdomen/retroperitoneum, which are relatively decreased in size overall when compared to prior PET-CT of July 2018, likely reflecting treatment changes.  Aortic atherosclerosis and associated coronary artery disease. Aortic Atherosclerosis (ICD10-I70.0).    02/05/2018 Procedure    Removal of implanted Port-A-Cath utilizing sharp and blunt dissection. The procedure was uncomplicated.     REVIEW OF SYSTEMS:   Constitutional: Denies fevers, chills or abnormal weight loss Eyes: Denies blurriness of vision Ears, nose, mouth, throat, and face: Denies mucositis or sore throat Respiratory: Denies cough, dyspnea or wheezes Cardiovascular: Denies  palpitation, chest discomfort or lower extremity swelling Gastrointestinal:  Denies nausea, heartburn or change in  bowel habits Skin: Denies abnormal skin rashes Lymphatics: Denies new lymphadenopathy or easy bruising Neurological:Denies numbness, tingling or new weaknesses Behavioral/Psych: Mood is stable, no new changes  All other systems were reviewed with the patient and are negative.  I have reviewed the past medical history, past surgical history, social history and family history with the patient and they are unchanged from previous note.  ALLERGIES:  is allergic to rituximab.  MEDICATIONS:  Current Outpatient Medications  Medication Sig Dispense Refill  . acetaminophen-codeine (TYLENOL #3) 300-30 MG tablet Take 1 tablet by mouth every 8 (eight) hours as needed for moderate pain.    Marland Kitchen acetaminophen (TYLENOL) 500 MG tablet Take 650 mg by mouth daily as needed for mild pain or headache.     Marland Kitchen acyclovir (ZOVIRAX) 400 MG tablet Take 1 tablet (400 mg total) by mouth daily. 30 tablet 3  . amLODipine (NORVASC) 10 MG tablet Take 1 tablet (10 mg total) by mouth daily. 30 tablet 5  . aspirin EC 81 MG EC tablet Take 1 tablet (81 mg total) by mouth daily. 30 tablet 2  . famotidine (PEPCID) 20 MG tablet Take 1 tablet (20 mg total) by mouth 2 (two) times daily. 30 tablet 9  . hydrALAZINE (APRESOLINE) 25 MG tablet Take 1 tablet (25 mg total) by mouth 3 (three) times daily. 90 tablet 3  . lovastatin (MEVACOR) 40 MG tablet Take 40 mg by mouth at bedtime.     . metoprolol tartrate (LOPRESSOR) 25 MG tablet Take 1 tablet (25 mg total) by mouth 2 (two) times daily. 60 tablet 2  . montelukast (SINGULAIR) 10 MG tablet Take 10 mg by mouth at bedtime.  4  . omeprazole (PRILOSEC) 20 MG capsule Take 1 capsule (20 mg total) by mouth daily. 30 capsule 11  . potassium chloride (K-DUR) 10 MEQ tablet Take 10 mEq by mouth daily.    . predniSONE (DELTASONE) 5 MG tablet Take 1 tablet (5 mg total) by mouth daily with breakfast. 30 tablet 9  . QUEtiapine (SEROQUEL) 25 MG tablet Take 12.5 mg by mouth at bedtime.    . sertraline  (ZOLOFT) 50 MG tablet Take 50 mg by mouth daily.     No current facility-administered medications for this visit.     PHYSICAL EXAMINATION: ECOG PERFORMANCE STATUS: 2 - Symptomatic, <50% confined to bed  Vitals:   03/29/18 0910  BP: (!) 148/53  Pulse: 62  Resp: 18  Temp: 98.2 F (36.8 C)  SpO2: 95%   Filed Weights   03/29/18 0910  Weight: 156 lb 9.6 oz (71 kg)    GENERAL:alert, no distress and comfortable.  She looks mildly cushingoid SKIN: skin color, texture, turgor are normal, no rashes or significant lesions EYES: normal, Conjunctiva are pink and non-injected, sclera clear OROPHARYNX:no exudate, no erythema and lips, buccal mucosa, and tongue normal  NECK: supple, thyroid normal size, non-tender, without nodularity LYMPH:  no palpable lymphadenopathy in the cervical, axillary or inguinal LUNGS: clear to auscultation and percussion with normal breathing effort HEART: regular rate & rhythm and no murmurs and no lower extremity edema ABDOMEN:abdomen soft, mild tenderness on palpation on the epigastric region Musculoskeletal:no cyanosis of digits and no clubbing  NEURO: alert & oriented x 3 with fluent speech, no focal motor/sensory deficits  LABORATORY DATA:  I have reviewed the data as listed    Component Value Date/Time   NA 136 02/05/2018 1206  NA 138 02/17/2017 0810   K 3.7 02/05/2018 1206   K 3.7 02/17/2017 0810   CL 99 02/05/2018 1206   CO2 28 02/05/2018 1206   CO2 27 02/17/2017 0810   GLUCOSE 101 (H) 02/05/2018 1206   GLUCOSE 92 02/17/2017 0810   BUN 18 02/05/2018 1206   BUN 16.4 02/17/2017 0810   CREATININE 1.20 (H) 02/05/2018 1206   CREATININE 1.1 02/17/2017 0810   CALCIUM 9.2 02/05/2018 1206   CALCIUM 9.3 02/17/2017 0810   PROT 6.0 (L) 01/14/2018 1024   PROT 6.4 02/17/2017 0810   ALBUMIN 3.6 01/14/2018 1024   ALBUMIN 3.9 02/17/2017 0810   AST 18 01/14/2018 1024   AST 13 02/17/2017 0810   ALT 18 01/14/2018 1024   ALT 13 02/17/2017 0810   ALKPHOS  60 01/14/2018 1024   ALKPHOS 74 02/17/2017 0810   BILITOT 0.7 01/14/2018 1024   BILITOT 0.60 02/17/2017 0810   GFRNONAA 39 (L) 02/05/2018 1206   GFRAA 45 (L) 02/05/2018 1206    No results found for: SPEP, UPEP  Lab Results  Component Value Date   WBC 24.0 (H) 03/29/2018   NEUTROABS 2.4 03/29/2018   HGB 12.6 03/29/2018   HCT 39.8 03/29/2018   MCV 86.5 03/29/2018   PLT 293 03/29/2018      Chemistry      Component Value Date/Time   NA 136 02/05/2018 1206   NA 138 02/17/2017 0810   K 3.7 02/05/2018 1206   K 3.7 02/17/2017 0810   CL 99 02/05/2018 1206   CO2 28 02/05/2018 1206   CO2 27 02/17/2017 0810   BUN 18 02/05/2018 1206   BUN 16.4 02/17/2017 0810   CREATININE 1.20 (H) 02/05/2018 1206   CREATININE 1.1 02/17/2017 0810      Component Value Date/Time   CALCIUM 9.2 02/05/2018 1206   CALCIUM 9.3 02/17/2017 0810   ALKPHOS 60 01/14/2018 1024   ALKPHOS 74 02/17/2017 0810   AST 18 01/14/2018 1024   AST 13 02/17/2017 0810   ALT 18 01/14/2018 1024   ALT 13 02/17/2017 0810   BILITOT 0.7 01/14/2018 1024   BILITOT 0.60 02/17/2017 0810      All questions were answered. The patient knows to call the clinic with any problems, questions or concerns. No barriers to learning was detected.  I spent 15 minutes counseling the patient face to face. The total time spent in the appointment was 20 minutes and more than 50% was on counseling and review of test results  Heath Lark, MD 03/29/2018 11:00 AM

## 2018-03-29 NOTE — Telephone Encounter (Signed)
Gave avs and calendar ° °

## 2018-04-28 ENCOUNTER — Other Ambulatory Visit: Payer: Self-pay

## 2018-04-28 DIAGNOSIS — C9110 Chronic lymphocytic leukemia of B-cell type not having achieved remission: Secondary | ICD-10-CM

## 2018-04-29 ENCOUNTER — Inpatient Hospital Stay: Payer: Medicare Other

## 2018-04-29 ENCOUNTER — Inpatient Hospital Stay: Payer: Medicare Other | Admitting: Hematology and Oncology

## 2018-05-13 ENCOUNTER — Telehealth: Payer: Self-pay | Admitting: Hematology and Oncology

## 2018-05-13 ENCOUNTER — Inpatient Hospital Stay: Payer: Medicare Other | Admitting: Hematology and Oncology

## 2018-05-13 ENCOUNTER — Inpatient Hospital Stay: Payer: Medicare Other

## 2018-05-13 NOTE — Telephone Encounter (Signed)
Called patient per 2/6 sch message - left message for patient to call back to r/s

## 2018-06-23 ENCOUNTER — Telehealth: Payer: Self-pay | Admitting: Hematology and Oncology

## 2018-06-23 NOTE — Telephone Encounter (Signed)
Patient called to cancel °

## 2018-07-01 ENCOUNTER — Ambulatory Visit: Payer: Medicare Other | Admitting: Hematology and Oncology

## 2018-07-01 ENCOUNTER — Other Ambulatory Visit: Payer: Medicare Other

## 2018-07-22 ENCOUNTER — Encounter: Payer: Self-pay | Admitting: Hematology and Oncology

## 2018-10-20 ENCOUNTER — Telehealth: Payer: Self-pay | Admitting: Internal Medicine

## 2018-10-20 NOTE — Telephone Encounter (Signed)
Called patient to schedule Palliative consult, I was in the process of explaining why I was calling and patient stated "Honey, I don't have anything" and hung the phone up.  I then called patient's daughter [name] with no answer.  Left daughter a message as to what transpired and requested a call back, I left my name and contact information with her.

## 2018-10-27 ENCOUNTER — Telehealth: Payer: Self-pay | Admitting: Internal Medicine

## 2018-10-27 NOTE — Telephone Encounter (Signed)
Contacted patient's daughter again in an attempt to schedule the Palliative Consult, no answer.  Left another message requesting a call back, left my name and contact number as well.

## 2018-10-28 ENCOUNTER — Non-Acute Institutional Stay: Payer: Medicare Other | Admitting: Internal Medicine

## 2018-10-28 ENCOUNTER — Other Ambulatory Visit: Payer: Self-pay

## 2018-10-28 DIAGNOSIS — Z515 Encounter for palliative care: Secondary | ICD-10-CM

## 2018-10-28 NOTE — Progress Notes (Addendum)
July 23rd, 2020 [facility] Palliative Care Consult Note Telephone: [phone]  Fax: [phone]  PATIENT NAME: Carla Conway DOB: 02-06-1927 MRN: [id number] pt's cell [phone] (but very hard of hearing) [place] in date 10/17/2017  PRIMARY CARE PROVIDER:   Lujean Amel, MD  [facility] [address] [phone] ([place] 10/18/2018) Dr. Heath Lark (Oncology) patient called to cancel apt? (LOV 03/29/2018)  REFERRING PROVIDER:  Lujean Amel, MD [address],  [place] [address]  RESPONSIBLE PARTY:   Daughter [name] /dtr cell [phone] [email or web address]. Lorayne Bender Pra ([place]), Bertram Millard Pra ([place]), Zach Cleaster Corin Pra ([place]), Suzette Fox ([place])  ASSESSMENT / RECOMMENDATIONS:  1.Functional and Cognitive status:  Pleasantly conversant. Alert and oriented times three. Patient's daughter [name] reports patient sometimes awakens from dreams and thinks they are real. Increased forgetfulness and some short-term memory loss. She is very hard of hearing. Patient ambulates about in her room independently; use of walker outside of the room She's independent and dressing, hygiene and transfers.  She has had a few falls over the last 6 months; 1 time tripping over her walker and another time rolling out of bed. Her bed height has now been adjusted downward. Patient reports improve appetite. She has chronic postprandial abdominal discomfort (about 80% of the time). No clear etiology. She looks well-nourished for her height. Her current weight is 159 lbs.  At a height of 5 ft 5.5" her BMI is 26.05 kg /m2 .   2. Symptom management:  Anxiety / anxiousness:  Patient reports early awakening insomnia with anxiety and anxiousness. Unfortunately, due to social distancing and isolation from covid-19, she has less opportunity to visit with other facility residence and with her family. She particularly misses interaction with her  daughter [name]. She feels her anxiety is improved from a few months ago. She discussed not wanting to be a burden on her family. She's grateful for her children chipping in and providing a home for her to live in here at [place]. She feels very supported by her daughter [name], who calls and visits frequently. She has been followed in the past by psych PA Pamella Pert. Patient's Seroquel had been discontinued some months earlier. She continues on Zoloft 100 mg QD with melatonin 3 mg qhs.   -Ask Psych to reconsult; recommendations re: antidepressant adjustment.  3. Family Supports: Resident of [facility]. She had previously worked as a Manufacturing engineer. Both her spouses are deceased. She raised her children essentially as a single mom. Daughter [name] lives in town and is in frequent contact.   4. Advanced Care Directive: Patient has been resuscitated successfully in the past d/t allergic reaction from chemo. She desires to remain a full code in the event of cardiopulmonary arrest.  5. Goals of Care:  To live out these last years with some kind of positive outlook. She is really bothered when can't remember things. Worries about being old, and about being a bother to people.   6. Follow up Palliative Care Visit: in 1-2 months (week of Sept 6th). I e-mailed dtr [name] educational materials on DNR/MOST. F/U TC to daughter: she was busy moving her daughter to home in [place]. I gave [name] my contact info for f/u.   I spent 60 minutes providing this consultation, from 2pm-3pm. More than 50% of the time in this consultation was spent coordinating communication.   HISTORY OF PRESENT ILLNESS:  Ms. Carla Conway  is an 85 yo with h/o advanced dementia, depression (Sertaline), chronic lymphocytic leukemia (Dr. Alvy Bimler (prednisone). Past trial Rituximab with allergic reaction. R port a cath. No further treatment planned), CAD (stents), HTN, HLD, falls (2018, 2019). Palliative care  was consulted to assist with Goals of Care, advance care directives, and symptom management.  CODE STATUS: Full  PPS: 60% HOSPICE ELIGIBILITY/DIAGNOSIS: TBD  PAST MEDICAL HISTORY:  Past Medical History:  Diagnosis Date  . Asthma   . CAD (coronary artery disease)   . Falls frequently   . Hyperlipidemia   . Hypertension     SOCIAL HX:  Social History   Tobacco Use  . Smoking status: Former Smoker    Types: Cigarettes    Start date: 05/18/1983  . Smokeless tobacco: Never Used  Substance Use Topics  . Alcohol use: No    ALLERGIES:  Allergies  Allergen Reactions  . Rituximab Other (See Comments)    "She coded"/Upset stomach and blood pressure dropped     PERTINENT MEDICATIONS:  Outpatient Encounter Medications as of 10/28/2018  Medication Sig  . acetaminophen (TYLENOL) 325 MG tablet Take 650 mg by mouth daily as needed for mild pain or headache.   Marland Kitchen acyclovir (ZOVIRAX) 400 MG tablet Take 1 tablet (400 mg total) by mouth daily.  Marland Kitchen amLODipine (NORVASC) 10 MG tablet Take 1 tablet (10 mg total) by mouth daily.  Marland Kitchen aspirin EC 81 MG EC tablet Take 1 tablet (81 mg total) by mouth daily.  . famotidine (PEPCID) 20 MG tablet Take 1 tablet (20 mg total) by mouth 2 (two) times daily.  . hydrALAZINE (APRESOLINE) 25 MG tablet Take 1 tablet (25 mg total) by mouth 3 (three) times daily.  Marland Kitchen lovastatin (MEVACOR) 40 MG tablet Take 40 mg by mouth at bedtime.   . Melatonin 3 MG TABS Take 3 mg by mouth.  . metoprolol tartrate (LOPRESSOR) 25 MG tablet Take 1 tablet (25 mg total) by mouth 2 (two) times daily.  . montelukast (SINGULAIR) 10 MG tablet Take 10 mg by mouth at bedtime.  Marland Kitchen omeprazole (PRILOSEC) 20 MG capsule Take 1 capsule (20 mg total) by mouth daily.  . potassium chloride (K-DUR) 10 MEQ tablet Take 10 mEq by mouth daily.  . predniSONE (DELTASONE) 5 MG tablet Take 1 tablet (5 mg total) by mouth daily with breakfast.  . sertraline (ZOLOFT) 100 MG tablet Take 100 mg by mouth daily.   Marland Kitchen  acetaminophen-codeine (TYLENOL #3) 300-30 MG tablet Take 1 tablet by mouth every 8 (eight) hours as needed for moderate pain.  . [DISCONTINUED] QUEtiapine (SEROQUEL) 25 MG tablet Take 12.5 mg by mouth at bedtime.    PHYSICAL EXAM:   General: Well nourished, pleasantly conversant, no distress. A & O X 3 Pulmonary: clear ant fields Abdomen: soft, nontender, + bowel sounds GU: no suprapubic tenderness Extremities: no edema, no joint deformities Skin: no rashes Neurological: Weakness but otherwise nonfocal  Julianne Handler, NP-C

## 2018-10-31 ENCOUNTER — Encounter: Payer: Self-pay | Admitting: Internal Medicine

## 2018-12-21 ENCOUNTER — Other Ambulatory Visit: Payer: Self-pay

## 2018-12-21 ENCOUNTER — Non-Acute Institutional Stay: Payer: Medicare Other | Admitting: Internal Medicine

## 2018-12-21 DIAGNOSIS — Z515 Encounter for palliative care: Secondary | ICD-10-CM

## 2018-12-23 ENCOUNTER — Encounter: Payer: Self-pay | Admitting: Internal Medicine

## 2018-12-23 NOTE — Progress Notes (Signed)
July 23rd, 2020 [facility] Palliative Care Consult Note Telephone: [phone]  Fax: [phone]   PATIENT NAME: Carla Conway DOB: 1926/08/28 MRN: [id number] pt's cell [phone] (but very hard of hearing) [place] in date 10/17/2017   PRIMARY CARE PROVIDER:   Lujean Amel, MD  [facility] [place] [address] [phone] ([place] 10/18/2018) Dr. Heath Lark (Oncology) patient called to cancel apt? (LOV 03/29/2018)   REFERRING PROVIDER:  Lujean Amel, MD [address],  [place] [address]   RESPONSIBLE PARTY:   (daughter/HCPOA) Carla Conway /dtr cell [phone] [email or web address]. Carla Conway ([place]), Carla Conway ([place]), Carla Conway ([place]), Carla Conway ([place])7/30: [facility] Pamella Pert PA-C: eval for Increased depression: d/c melatonin; non-usage. Increase of sertraline 124m qd     ASSESSMENT / RECOMMENDATIONS:  1.Advance Care Planning: A. Directives: Patient has been resuscitated successfully in the past d/t allergic reaction from chemo. She desires to remain a full code in the event of cardiopulmonary arrest. HCPOA (dtr GGibraltarHarvey) and Living Will present on chart.   B. Goals of Care:  To live out these last years with some kind of positive outlook. She is really bothered when can't remember things. Worries about being old, and about being a bother to people.    2. Functional and Cognitive status: Patient continues pleasantly conversant. Alert and oriented. LPamella PertPA has been following for psych; she increased her Sertraline ot [id number]. Patient seem to be doing well with this. She continues independent in ADLs.  She still c/o social isolation from [place] but is understanding. Her weight has been stable at around 159lbs. at a height of 5 ft 5.5" her BMI is 26.05 kg /m2. We clipped down her long toenails today.  3. Family Supports: Resident of [facility]. 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 She raised her children essentially as a single mom. Daughter GeGibraltarives in town and is in frequent contact.      4. Follow up Palliative Care Visit: in 1-2 months (Week of Nov 1st). Will call daughter GeGibraltarith updates.   I spent 30 minutes providing this consultation, from 2pm-3pm. More than 50% of the time in this consultation was spent coordinating communication.    HISTORY OF PRESENT ILLNESS:  Ms. Carla Conway an [age]o with h/o advanced dementia, depression (Sertaline), chronic lymphocytic leukemia (Dr. GoAlvy Bimlerprednisone). Past trial Rituximab with allergic reaction. R port a cath. No further treatment planned), CAD (stents), HTN, HLD, falls (2018, 2019). This is aPalliative care f/u visit from 10/28/2018.    CODE STATUS: Full   PPS: 60% HOSPICE ELIGIBILITY/DIAGNOSIS: TBD  PAST MEDICAL HISTORY:  Past Medical History:  Diagnosis Date  . Asthma   . CAD (coronary artery disease)   . Falls frequently   . Hyperlipidemia   . Hypertension     SOCIAL HX:  Social History   Tobacco Use  . Smoking status: Former Smoker    Types: Cigarettes    Start date: 05/18/1983  . Smokeless tobacco: Never Used  Substance Use Topics  . Alcohol use: No    ALLERGIES:  Allergies  Allergen Reactions  . Rituximab Other (See Comments)    "She coded"/Upset stomach and blood pressure dropped     PERTINENT MEDICATIONS:  Outpatient Encounter Medications as of 12/21/2018  Medication Sig  . acetaminophen (TYLENOL) 325 MG tablet Take 650 mg by  mouth daily as needed for mild pain or headache.   Marland Kitchen acetaminophen-codeine (TYLENOL #3) 300-30 MG tablet Take 1 tablet by mouth every 8 (eight) hours as needed for moderate pain.  Marland Kitchen acyclovir (ZOVIRAX) 400 MG tablet Take 1 tablet (400 mg total) by mouth daily.  Marland Kitchen amLODipine (NORVASC) 10 MG tablet Take 1 tablet (10 mg total) by mouth daily.  Marland Kitchen aspirin EC 81 MG EC tablet Take 1  tablet (81 mg total) by mouth daily.  . famotidine (PEPCID) 20 MG tablet Take 1 tablet (20 mg total) by mouth 2 (two) times daily.  Marland Kitchen lovastatin (MEVACOR) 40 MG tablet Take 40 mg by mouth at bedtime.   . Melatonin 3 MG TABS Take 3 mg by mouth.  . metoprolol tartrate (LOPRESSOR) 25 MG tablet Take 1 tablet (25 mg total) by mouth 2 (two) times daily.  . montelukast (SINGULAIR) 10 MG tablet Take 10 mg by mouth at bedtime.  Marland Kitchen omeprazole (PRILOSEC) 20 MG capsule Take 1 capsule (20 mg total) by mouth daily.  . potassium chloride (K-DUR) 10 MEQ tablet Take 10 mEq by mouth daily.  . predniSONE (DELTASONE) 5 MG tablet Take 1 tablet (5 mg total) by mouth daily with breakfast.  . sertraline (ZOLOFT) 100 MG tablet Take 100 mg by mouth daily.    No facility-administered encounter medications on file as of 12/21/2018.     PHYSICAL EXAM:   General: Well nourished, pleasantly conversant, no distress. A & O X 3 Pulmonary: clear ant fields Abdomen: soft, nontender, + bowel sounds GU: no suprapubic tenderness Extremities: no edema, no joint deformities Skin: no rashes Neurological: Weakness but otherwise nonfocal  Julianne Handler, NP

## 2018-12-30 ENCOUNTER — Telehealth: Payer: Self-pay | Admitting: *Deleted

## 2018-12-30 NOTE — Telephone Encounter (Signed)
Lab results received and reviewed. Patient's daughter called and advised labs look good. No need for treatment at this time, appointments continue to be deferred until guests are allowed back in the [facility]. No urgency at this time. Patient is doing well.

## 2019-01-02 IMAGING — CT NM PET TUM IMG INITIAL (PI) SKULL BASE T - THIGH
7 of 8 series · 23 of 25 positions shown · non-contrast
Comparison: CT scans from 05/08/2016

CLINICAL DATA: Initial treatment strategy for small lymphocytic
lymphoma.

EXAM:
NUCLEAR MEDICINE PET SKULL BASE TO THIGH
TECHNIQUE: 7.4 mCi F-18 FDG was injected intravenously. Full-ring PET imaging
was performed from the skull base to thigh after the radiotracer. CT
data was obtained and used for attenuation correction and anatomic
localization.
FASTING BLOOD GLUCOSE:  Value: 103 mg/dl

[Series 3: pet sk_thigh ac · axial · 5.0mm · 4.07mm/px · z∈[-172,+672]mm · 4 of 212 slices shown]
[im 1/212]
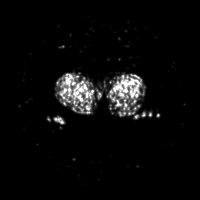
[im 71/212]
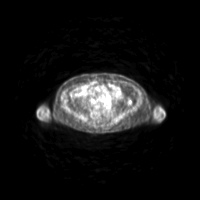
[im 141/212]
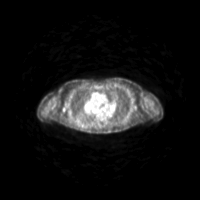
[im 212/212]
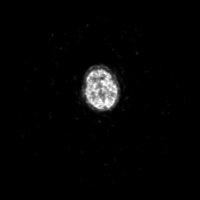

[Series 4: ct sk_thigh 5.0 b31f · axial · 5.0mm · 0.98mm/px · z∈[-172,+672]mm · 4 of 212 slices shown]
[im 1/212]
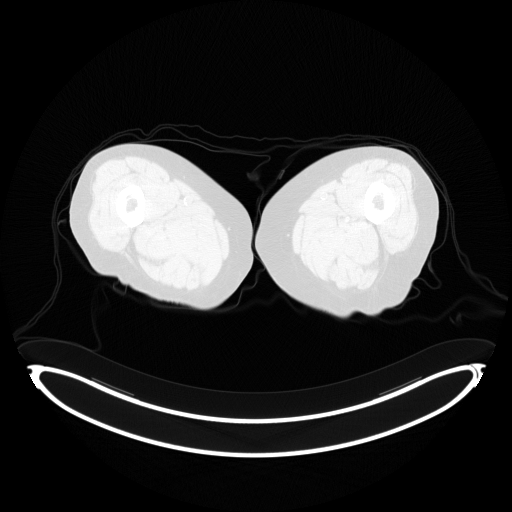
[im 53/212]
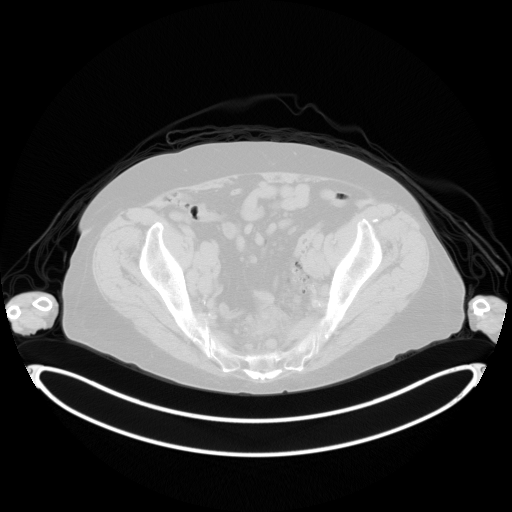
[im 159/212]
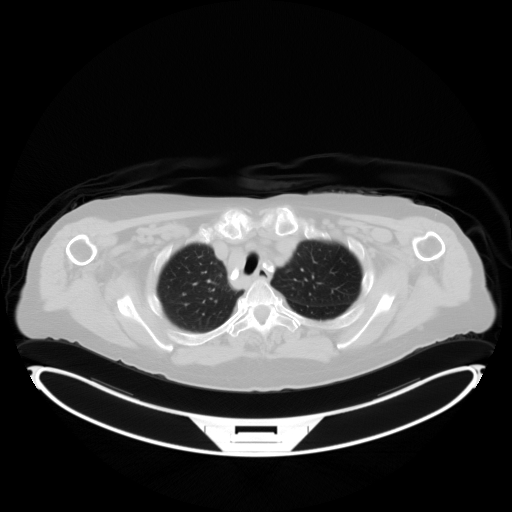
[im 212/212  brain]
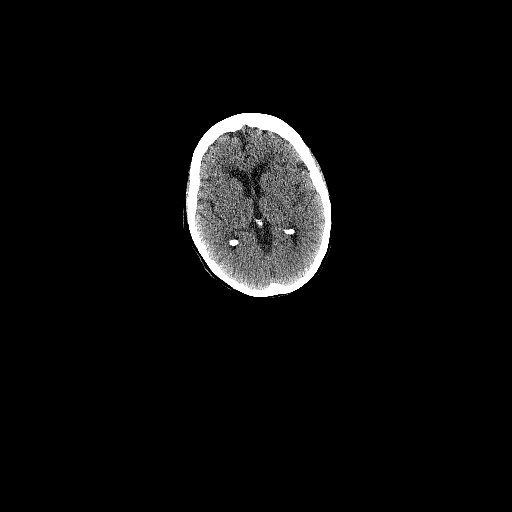

[Series 7: ct sk_thigh 5.0 b70f lung_bone · axial · 5.0mm · 0.60mm/px · z∈[+272,+512]mm · 2 of 61 slices shown]
[im 1/61  bone]
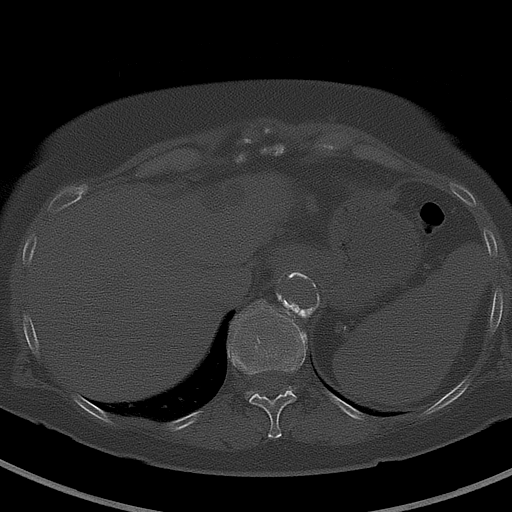
[im 61/61  bone]
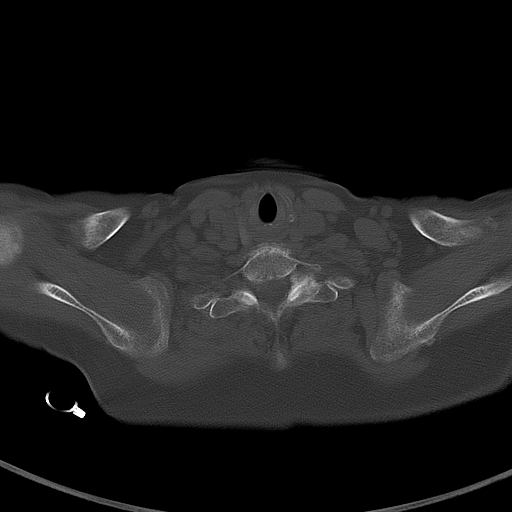

[Series 8: pet sk_thigh nac · axial · 5.0mm · 4.07mm/px · z∈[-172,+672]mm · 5 of 212 slices shown]
[im 1/212]
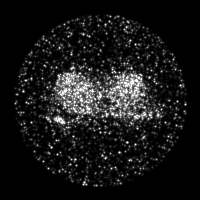
[im 53/212]
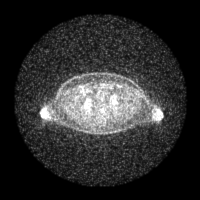
[im 106/212]
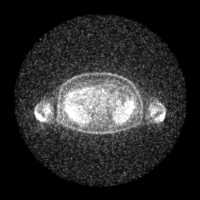
[im 159/212]
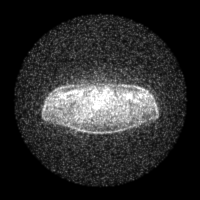
[im 212/212]
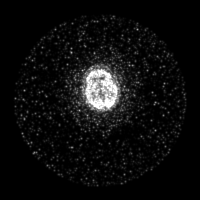

[Series 604: range-ct sk_thigh 5.0 (id)<alpha range> · 2 of 74 slices shown (1 of 2)]
[im 1/74]
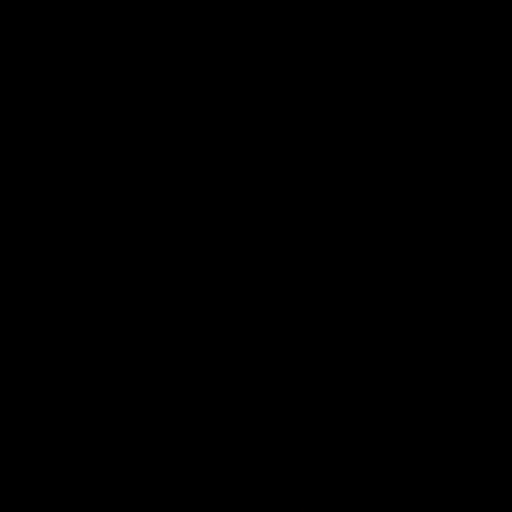
[im 74/74]
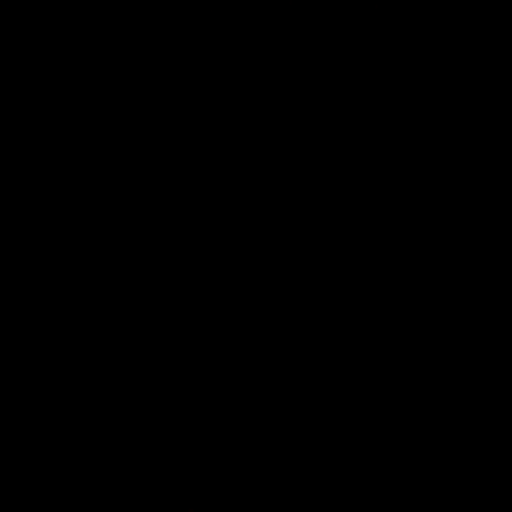

[Series 606: range-ct sk_thigh 5.0 (id)<alpha range> · 5 of 201 slices shown (2 of 2)]
[im 1/201]
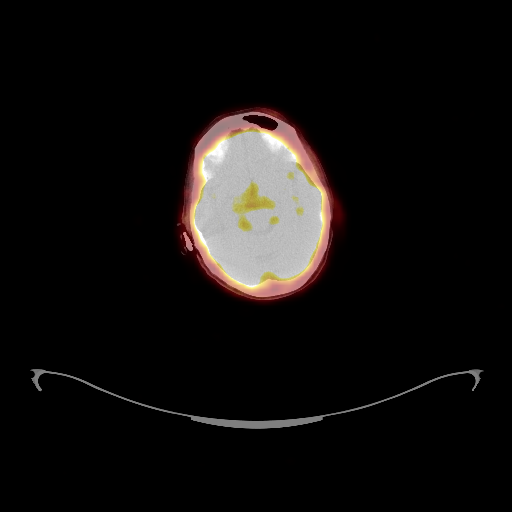
[im 51/201]
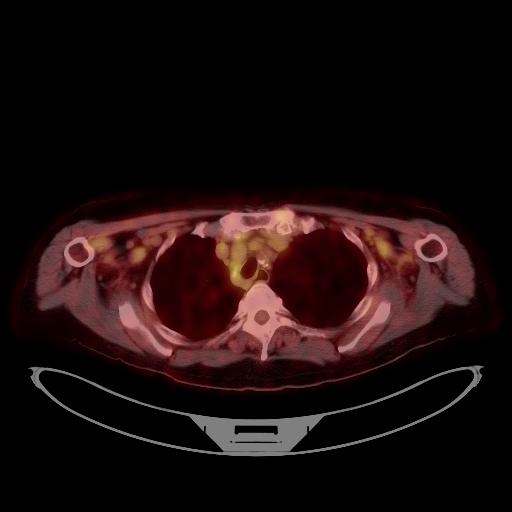
[im 101/201]
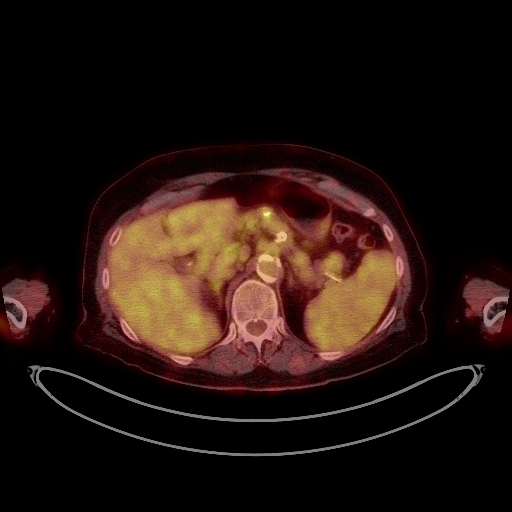
[im 151/201]
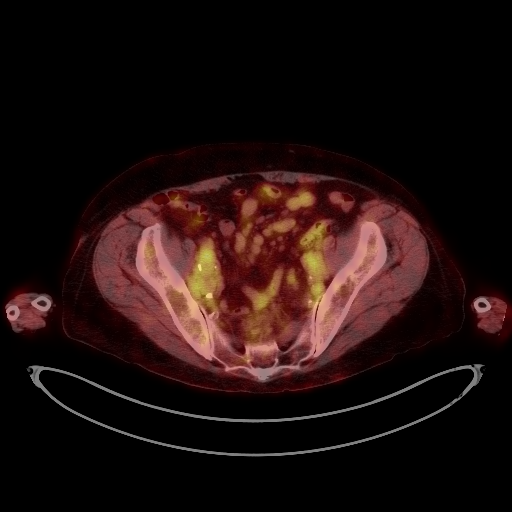
[im 201/201]
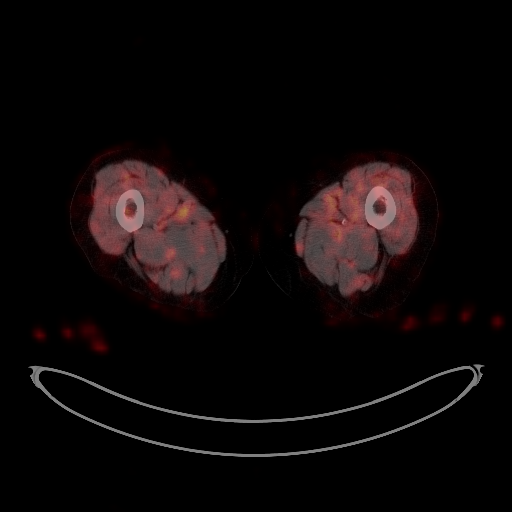

[Series 1032: results mm oncology reading · 5.0mm · 0.67mm/px · 1 of 7 slices shown]
[im 1/7]
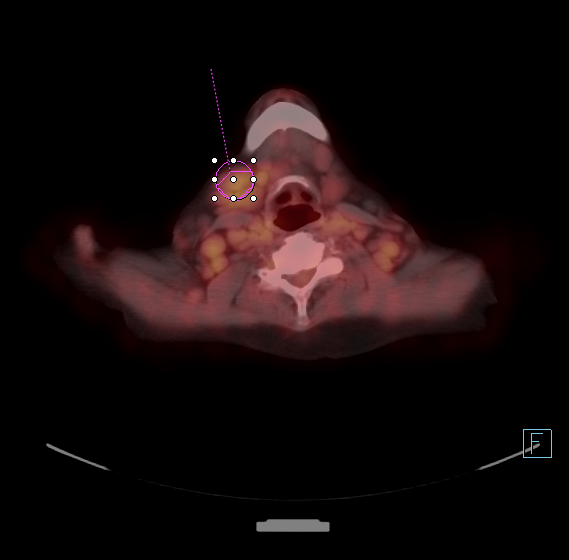

[23 of 25 positions shown; findings below may reference images not displayed]

FINDINGS: NECK

Extensive superficial and deep parotid lymph nodes along with lymph
nodes in levels IIa, IIb, Ib, Ia, IV, III, and V bilaterally these
demonstrate relatively low-grade activity, with a new right
submandibular node on image 35/4 measuring 1.3 cm in diameter with
maximum standard uptake value 3.6.

CHEST

Bilateral supraclavicular fossa, subpectoral, axillary,
paratracheal, prevascular, new AP window, subcarinal, hilar, and
right internal mammary adenopathy noted. An index right
supraclavicular node measuring 2.1 cm in short axis on image 48/4
has a maximum SUV new of 3.7. An index left axillary node on image
58/4 measuring 1.7 cm in short axis has a maximum SUV of 3.2.

Coronary, aortic arch, and branch vessel atherosclerotic vascular
disease. Atelectasis or scarring in the lower lobes bilaterally.

Background blood pool activity in the mediastinum 3.2.

ABDOMEN/PELVIS

New new pathologic porta hepatis, gastrohepatic ligament,
peripancreatic, retroperitoneal, mesenteric, common iliac, external
iliac, internal iliac, perirectal, and inguinal adenopathy noted. An
index left periaortic node measuring 2.1 cm in short axis on image
122/4 has a maximum SUV of 4.1. New new an index right external
iliac node measuring 1.8 cm in short axis on image 166/4 has a
maximum SUV of 4.4.

There is sigmoid diverticulosis with some confluent mesenteric edema
and mesenteric lymph nodes adjacent to the diverticulosis,
diverticulitis is suspected. There is some focal hypermetabolic
activity in this vicinity of suspected inflammation along the
sigmoid colon, maximum SUV 8.4. No extraluminal gas. There some
scattered enlarged omental lymph nodes as well, for example on image
123/4.

The spleen measures 12.3 by 6.2 by 13.0 cm (volume = 520 cm^3).

There is scattered hypodense lesions in the liver, with the larger
lesions photopenic compared to hepatic parenchyma. The smaller
lesions are technically nonspecific.

There is an exophytic high density lesion from the right kidney
upper pole measuring 1.5 cm in diameter on image 113/4 new new new,
I do not see appreciable hypermetabolic activity and accordingly
this is probably a complex cyst, although not considered 100%
specific.

Aortoiliac atherosclerotic vascular disease. Descending colon
diverticulosis is present. There is high activity along some soft
tissue prominence in the region of the anus, somewhat resembling an
external hemorrhoid. Some of this activity could be from urinary
contamination/ incontinence. Maximum SUV 13.8.

Background hepatic SUV 4.0.

SKELETON

No hypermetabolic skeletal lesions are identified. There is evidence
of lumbar spondylosis and degenerative disc disease. New
IMPRESSION: 1. Extensive adenopathy in the neck, chest, abdomen, and pelvis as
detailed above, primarily [HOSPITAL] 3 and [HOSPITAL] 4. Mild
splenomegaly.
2. High activity in some soft tissue prominence along the anal
region, possibly physiologic or due to hemorrhoidal activity or
urinary incontinence, but correlation with anorectal exam
recommended.
3. Focal abnormal accentuated high activity in the sigmoid colon
along a region of abnormal stranding and diverticulosis, suspicious
for active diverticulitis. Correlation with the patient's colon
cancer screening history is recommended. If screening is not
up-to-date, appropriate screening should be considered.
4.  Aortic Atherosclerosis (BTVY7-YL9.9).  Coronary atherosclerosis.
5. An exophytic high density lesion from the right kidney upper pole
does not appear to be hypermetabolic and accordingly is most likely
to be a benign complex cyst, but this lesion is not fully
characterized on today' s exam. If the patient has hematuria or if
otherwise clinically warranted, renal protocol MRI or CT examination
could be utilized.
6. Some of the smaller hepatic lesions are nonspecific, but the
larger hepatic lesions are photopenic and likely cysts.

## 2019-02-03 ENCOUNTER — Other Ambulatory Visit: Payer: Self-pay

## 2019-02-03 ENCOUNTER — Non-Acute Institutional Stay: Payer: Medicare Other | Admitting: Internal Medicine

## 2019-02-03 DIAGNOSIS — Z515 Encounter for palliative care: Secondary | ICD-10-CM

## 2019-02-07 NOTE — Progress Notes (Signed)
Patient scheduled in error; no visit made, no charge. Shakia Sebastiano NP-C  [phone]

## 2019-02-27 ENCOUNTER — Other Ambulatory Visit: Payer: Self-pay

## 2019-02-27 ENCOUNTER — Emergency Department (HOSPITAL_COMMUNITY)
Admission: EM | Admit: 2019-02-27 | Discharge: 2019-02-27 | Disposition: A | Discharge status: Home / Self Care | Payer: Medicare Other | Attending: Emergency Medicine | Admitting: Emergency Medicine

## 2019-02-27 ENCOUNTER — Emergency Department (HOSPITAL_COMMUNITY): Payer: Medicare Other

## 2019-02-27 ENCOUNTER — Encounter (HOSPITAL_COMMUNITY): Payer: Self-pay

## 2019-02-27 DIAGNOSIS — J45909 Unspecified asthma, uncomplicated: Secondary | ICD-10-CM | POA: Insufficient documentation

## 2019-02-27 DIAGNOSIS — N183 Chronic kidney disease, stage 3 unspecified: Secondary | ICD-10-CM | POA: Diagnosis not present

## 2019-02-27 DIAGNOSIS — Z79899 Other long term (current) drug therapy: Secondary | ICD-10-CM | POA: Insufficient documentation

## 2019-02-27 DIAGNOSIS — I129 Hypertensive chronic kidney disease with stage 1 through stage 4 chronic kidney disease, or unspecified chronic kidney disease: Secondary | ICD-10-CM | POA: Diagnosis not present

## 2019-02-27 DIAGNOSIS — Z87891 Personal history of nicotine dependence: Secondary | ICD-10-CM | POA: Diagnosis not present

## 2019-02-27 DIAGNOSIS — N644 Mastodynia: Secondary | ICD-10-CM

## 2019-02-27 DIAGNOSIS — I2510 Atherosclerotic heart disease of native coronary artery without angina pectoris: Secondary | ICD-10-CM | POA: Diagnosis not present

## 2019-02-27 DIAGNOSIS — Z7982 Long term (current) use of aspirin: Secondary | ICD-10-CM | POA: Insufficient documentation

## 2019-02-27 DIAGNOSIS — Z8673 Personal history of transient ischemic attack (TIA), and cerebral infarction without residual deficits: Secondary | ICD-10-CM | POA: Insufficient documentation

## 2019-02-27 LAB — CBC WITH DIFFERENTIAL/PLATELET
Abs Immature Granulocytes: 0.11 10*3/uL — ABNORMAL HIGH (ref 0.00–0.07)
Basophils Absolute: 0.2 10*3/uL — ABNORMAL HIGH (ref 0.0–0.1)
Basophils Relative: 0 %
Eosinophils Absolute: 0.2 10*3/uL (ref 0.0–0.5)
Eosinophils Relative: 1 %
HCT: 40 % (ref 36.0–46.0)
Hemoglobin: 12 g/dL (ref 12.0–15.0)
Immature Granulocytes: 0 %
Lymphocytes Relative: 74 %
Lymphs Abs: 30.6 10*3/uL — ABNORMAL HIGH (ref 0.7–4.0)
MCH: 27.5 pg (ref 26.0–34.0)
MCHC: 30 g/dL (ref 30.0–36.0)
MCV: 91.5 fL (ref 80.0–100.0)
Monocytes Absolute: 7.6 10*3/uL — ABNORMAL HIGH (ref 0.1–1.0)
Monocytes Relative: 19 %
Neutro Abs: 2.4 10*3/uL (ref 1.7–7.7)
Neutrophils Relative %: 6 %
Platelets: 192 10*3/uL (ref 150–400)
RBC: 4.37 MIL/uL (ref 3.87–5.11)
RDW: 16.5 % — ABNORMAL HIGH (ref 11.5–15.5)
WBC: 41.1 10*3/uL — ABNORMAL HIGH (ref 4.0–10.5)
nRBC: 0 % (ref 0.0–0.2)

## 2019-02-27 LAB — COMPREHENSIVE METABOLIC PANEL
ALT: 15 U/L (ref 0–44)
AST: 22 U/L (ref 15–41)
Albumin: 3.6 g/dL (ref 3.5–5.0)
Alkaline Phosphatase: 71 U/L (ref 38–126)
Anion gap: 8 (ref 5–15)
BUN: 21 mg/dL (ref 8–23)
CO2: 27 mmol/L (ref 22–32)
Calcium: 8.4 mg/dL — ABNORMAL LOW (ref 8.9–10.3)
Chloride: 106 mmol/L (ref 98–111)
Creatinine, Ser: 1 mg/dL (ref 0.44–1.00)
GFR calc Af Amer: 57 mL/min — ABNORMAL LOW (ref >60)
GFR calc non Af Amer: 50 mL/min — ABNORMAL LOW (ref >60)
Glucose, Bld: 104 mg/dL — ABNORMAL HIGH (ref 70–99)
Potassium: 3.6 mmol/L (ref 3.5–5.1)
Sodium: 141 mmol/L (ref 135–145)
Total Bilirubin: 0.6 mg/dL (ref 0.3–1.2)
Total Protein: 6 g/dL — ABNORMAL LOW (ref 6.5–8.1)

## 2019-02-27 LAB — TROPONIN I (HIGH SENSITIVITY)
Troponin I (High Sensitivity): 6 ng/L (ref <18)
Troponin I (High Sensitivity): 6 ng/L (ref <18)

## 2019-02-27 LAB — D-DIMER, QUANTITATIVE (NOT AT ARMC): D-Dimer, Quant: 0.5 ug/mL-FEU (ref 0.00–0.50)

## 2019-02-27 MED ORDER — ACETAMINOPHEN 500 MG PO TABS: 1000.0000 mg | ORAL_TABLET | Freq: Once | ORAL | Status: DC

## 2019-02-27 NOTE — ED Triage Notes (Signed)
Pt BIB EMS from [facility] ([place] room 12). Pt was found laying in bed, holding right breast. Pt states to EMS "my boob is trying to kill me". Pt c/o right breast pain. Pt states when she holds it, the pain goes away. Pt has hx of dementia, leukemia.

## 2019-02-27 NOTE — Discharge Instructions (Signed)
At this time, her work-up was reassuring.  She did have an elevated white blood cell count on her lab work today which can be reflective of her chronic leukemia.  This needs to be followed up by primary care doctor.  Additionally, provided her referral to the breast center of [facility].  She will need to have an ultrasound of breast to ensure that there is no other acute abnormalities.

## 2019-02-27 NOTE — ED Provider Notes (Signed)
Medical screening examination/treatment/procedure(s) were conducted as a shared visit with non-physician practitioner(s) and myself.  I personally evaluated the patient during the encounter.  EKG Interpretation  Date/Time:  Sunday February 27 2019 11:19:36 EST Ventricular Rate:  67 PR Interval:    QRS Duration: 102 QT Interval:  468 QTC Calculation: 495 R Axis:   7 Text Interpretation: Sinus rhythm RSR' in V1 or V2, probably normal variant Borderline T wave abnormalities Borderline prolonged QT interval No significant change since last tracing except new prolonged QT Confirmed by Fredia Sorrow [phone]) on 02/27/2019 11:25:08 AM   Patient seen by me along with physician assistant.  Patient is from [place].  Patient was found lying in bed holding her right breast.  Complaining that my breast is trying to kill me.  Palliative right breast pain.  Patient states it hurts when she holds it then goes away.  Patient has a history of dementia and leukemia.  When I saw patient patient had no memory of the pain.  Patient's initial troponin not significantly elevated will need a delta troponin.  EKG no significant change from previous EKGs.  Is showing evidence of hypertension here.  CBC still pending.  If CBC and delta troponin without significant change patient can be discharged back home.  Examination of the left breast without any acute findings no obvious tenderness no erythema no palpable mass.   Fredia Sorrow, MD 02/27/19 [phone]

## 2019-02-27 NOTE — ED Notes (Signed)
[facility] called  

## 2019-02-27 NOTE — ED Notes (Signed)
An After Visit Summary was printed and given to the patient. Discharge instructions given and pt leaving with [facility].

## 2019-02-27 NOTE — ED Provider Notes (Signed)
[facility] DEPT Provider Note   CSN: [id number] Arrival date & time: 02/27/19  1049     History   Chief Complaint Chief Complaint  Patient presents with   Right Breast Pain    HPI Carla Conway is a 85 y.o. female with PMH/o asthma, CLL, CAD, HLD, HTN brought in by EMS from [place] [facility] for evaluation of right breast pain.  Per EMS, staff found her in the bed this morning holding her right breast stating that "my boob is trying to kill me."  Patient states "it hurts all over."  She states that she does not have any difficulty breathing but feels like her pain is worse when she takes a deep breath in.  EM LEVEL 5 CAVEAT DUE TO DEMENTIA      The history is provided by the EMS personnel.    Past Medical History:  Diagnosis Date   Asthma    CAD (coronary artery disease)    Falls frequently    Hyperlipidemia    Hypertension     Patient Active Problem List   Diagnosis Date Noted   Preventive measure 12/29/2017   Other fatigue 09/25/2017   Malnutrition of moderate degree 07/20/2017   Enterocolitis 07/18/2017   Pancytopenia, acquired ([place]) 07/14/2017   Malignant cachexia ([place]) 06/04/2017   Hypokalemia 04/29/2017   Left arm pain 04/29/2017   TIA (transient ischemic attack) 04/29/2017   Abdominal pain 04/28/2017   Elevated CK 04/01/2017   AKI (acute kidney injury) ([place]) 03/31/2017   Dehydration 03/31/2017   Generalized weakness 03/31/2017   External hemorrhoids without complication 86/38/1771   Port-A-Cath in place 01/06/2017   Gastritis 01/06/2017   Poor memory 12/11/2016   Dysuria 11/13/2016   Anaphylaxis 10/29/2016   Anaphylactoid reaction 10/29/2016   Goals of care, counseling/discussion 10/28/2016   Chronic kidney disease, stage III (moderate) ([place]) 10/17/2016   Lymphoma, 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   Neutropenia ([place]) 09/23/2016   CLL (chronic lymphocytic leukemia) ([place]) 05/11/2016    Acute on chronic diastolic heart failure ([place]) 05/10/2016   Influenza A 05/09/2016   Lymphadenopathy of head and neck region 05/09/2016   Lymphadenopathy, thoracic 05/09/2016   COPD (chronic obstructive pulmonary disease) ([place]) 05/09/2016   Debility 05/09/2016   Syncopal episodes 05/09/2016   Syncope 05/08/2016   HTN (hypertension) 05/18/2013   CAD in native artery 05/18/2013    Past Surgical History:  Procedure Laterality Date   CATARACT EXTRACTION     CORONARY ANGIOPLASTY  10 -15 years ago   stent in [place] ,[place]   IR [place] RIGHT  10/23/2016   IR REMOVAL TUN ACCESS W/ PORT W/O FL MOD SED  02/05/2018   IR US GUIDE VASC ACCESS RIGHT  10/23/2016     OB History   No obstetric history on file.      Home Medications    Prior to Admission medications   Medication Sig Start Date End Date Taking? Authorizing Provider  acetaminophen (TYLENOL) 325 MG tablet Take 325 mg by mouth daily as needed for mild pain.    Yes [provider]  acyclovir (ZOVIRAX) 400 MG tablet Take 1 tablet (400 mg total) by mouth daily. 06/04/17  Yes Gorsuch, Ni, MD  amLODipine (NORVASC) 10 MG tablet Take 1 tablet (10 mg total) by mouth daily. 11/24/17  Yes Emokpae, Courage, MD  aspirin EC 81 MG EC tablet Take 1 tablet (81 mg total) by mouth daily. 05/02/17  Yes Oswald Hillock, MD  ciprofloxacin (CIPRO) 500 MG tablet Take 500 mg by mouth 2 (two) times daily. 02/23/19  Yes [provider]  hydrALAZINE (APRESOLINE) 25 MG tablet Take 1 tablet (25 mg total) by mouth 3 (three) times daily. 11/23/17 12/23/19 Yes Emokpae, Courage, MD  lovastatin (MEVACOR) 40 MG tablet Take 40 mg by mouth at bedtime.  05/09/13  Yes [provider]  metoprolol tartrate (LOPRESSOR) 25 MG tablet Take 1 tablet (25 mg total) by mouth 2 (two) times daily. 11/23/17  Yes Emokpae, Courage, MD  montelukast (SINGULAIR) 10 MG tablet Take 10 mg by mouth at bedtime. 05/15/17  Yes [provider]  omeprazole (PRILOSEC) 20 MG capsule Take 1 capsule (20 mg total) by mouth daily. 01/06/17  Yes Gorsuch, Ni, MD  potassium chloride (K-DUR) 10 MEQ tablet Take 10 mEq by mouth daily. 03/24/16  Yes [provider]  predniSONE (DELTASONE) 5 MG tablet Take 1 tablet (5 mg total) by mouth daily with breakfast. 03/29/18  Yes Gorsuch, Ni, MD  sertraline (ZOLOFT) 100 MG tablet Take 150 mg by mouth daily.    Yes [provider]    Family History Family History  Problem Relation Age of Onset   Cancer Father        colon    Social History Social History   Tobacco Use   Smoking status: Former Smoker    Types: Cigarettes    Start date: 05/18/1983   Smokeless tobacco: Never Used  Substance Use Topics   Alcohol use: No   Drug use: No     Allergies   Rituximab   Review of Systems Review of Systems  Unable to perform ROS: Dementia     Physical Exam Updated Vital Signs BP (!) 149/70    Pulse 70    Temp 97.9 F (36.6 C) (Oral)    Resp 18    SpO2 97%   Physical Exam Vitals signs and nursing note reviewed.  Constitutional:      Appearance: Normal appearance. She is well-developed.  HENT:     Head: Normocephalic and atraumatic.  Eyes:     General: Lids are normal.     Conjunctiva/sclera: Conjunctivae normal.     Pupils: Pupils are equal, round, and reactive to light.  Neck:     Musculoskeletal: Full passive range of motion without pain.  Cardiovascular:     Rate and Rhythm: Normal rate and regular rhythm.     Pulses: Normal pulses.          Radial pulses are 2+ on the right side and 2+ on the left side.       Dorsalis pedis pulses are 2+ on the right side and 2+ on the left side.     Heart sounds: Normal heart sounds. No murmur. No friction rub. No gallop.   Pulmonary:     Effort: Pulmonary effort is normal.     Breath sounds: Normal breath sounds.     Comments: Lungs clear to auscultation bilaterally.  Symmetric chest rise.  No wheezing,  rales, rhonchi. Chest:     Breasts:        Right: Inverted nipple present. No swelling or mass.        Left: Inverted nipple present. No swelling or mass.     Comments: The exam was performed with a chaperone present.  Patient is clutching her right breast stating that it hurts.  She points to the lower aspect of the right breast and onto the right lateral chest wall.  Bilateral breast are symmetric in appearance.  She does have bilateral inverted nipples.  No overlying warmth, erythema, edema noted bilateral breast.  No palpable mass. Abdominal:     Palpations: Abdomen is soft. Abdomen is not rigid.     Tenderness: There is no abdominal tenderness. There is no guarding.  Musculoskeletal: Normal range of motion.  Lymphadenopathy:     Upper Body:     Right upper body: No axillary adenopathy.     Left upper body: No axillary adenopathy.  Skin:    General: Skin is warm and dry.     Capillary Refill: Capillary refill takes less than 2 seconds.  Neurological:     Mental Status: She is alert and oriented to person, place, and time.  Psychiatric:        Speech: Speech normal.      ED Treatments / Results  Labs (all labs ordered are listed, but only abnormal results are displayed) Labs Reviewed  COMPREHENSIVE METABOLIC PANEL - Abnormal; Notable for the following components:      Result Value   Glucose, Bld 104 (*)    Calcium 8.4 (*)    Total Protein 6.0 (*)    GFR calc non Af Amer 50 (*)    GFR calc Af Amer 57 (*)    All other components within normal limits  CBC WITH DIFFERENTIAL/PLATELET - Abnormal; Notable for the following components:   WBC 41.1 (*)    RDW 16.5 (*)    Lymphs Abs 30.6 (*)    Monocytes Absolute 7.6 (*)    Basophils Absolute 0.2 (*)    Abs Immature Granulocytes 0.11 (*)    All other components within normal limits  D-DIMER, QUANTITATIVE (NOT AT [facility])  TROPONIN I (HIGH SENSITIVITY)  TROPONIN I (HIGH SENSITIVITY)    EKG EKG  Interpretation  Date/Time:  Sunday February 27 2019 11:19:36 EST Ventricular Rate:  67 PR Interval:    QRS Duration: 102 QT Interval:  468 QTC Calculation: 495 R Axis:   7 Text Interpretation: Sinus rhythm RSR' in V1 or V2, probably normal variant Borderline T wave abnormalities Borderline prolonged QT interval No significant change since last tracing except new prolonged QT Confirmed by Zackowski, Scott ([id number]) on 02/27/2019 11:25:08 AM   Radiology Dg Chest 2 View  Result Date: 02/27/2019 CLINICAL DATA:  Right-sided chest pain EXAM: CHEST - 2 VIEW COMPARISON:  11/19/2017 FINDINGS: The heart size and mediastinal contours are stable. Calcified thoracic aorta. Chronic linear scarring within the right mid lung. Lungs are otherwise clear without focal airspace consolidation. No pleural effusion or pneumothorax. Degenerative changes of the shoulders, right greater than left. IMPRESSION: No active cardiopulmonary disease. Electronically Signed   By: Nicholas  Plundo M.D.   On: 02/27/2019 12:43    Procedures Procedures (including critical care time)  Medications Ordered in ED Medications  acetaminophen (TYLENOL) tablet 1,000 mg (1,000 mg Oral Refused 02/27/19 1518)     Initial Impression / Assessment and Plan / ED Course  I have reviewed the triage vital signs and the nursing notes.  Pertinent labs & imaging results that were available during my care of the patient were reviewed by me and considered in my medical decision making (see chart for details).        85  year old female who presents from nursing home for evaluation of right breast pain.  Staff found her this morning holding her right breast complaining of pain.  Patient is vague on her symptoms and just states "it hurts all over."  She is able to tell me it hurts more with deep breathing.  She denies any other symptoms but she does have dementia so questionable history.  Initially arrival, she is afebrile, nontoxic-appearing.   Vital signs are stable.  I do not see any evidence of infectious etiology noted on my breast exam.  Additionally, there is no palpable mass.  Question whether or not this is related to chest pain versus infectious etiology from pulmonary source.  Plan to check labs, chest x-ray.  Troponin negative.  D-dimer negative.  MP shows normal sodium, normal potassium.  BUN and creatinine within normal limits.  Chest x-ray negative for any acute infectious etiology.  CBC shows leukocytosis of 21.  I suspect this is related to her chronic leukemia.  I reviewed her records and shows that she has had leukocytosis in the past.  Her last blood work was from 2018 which showed leukocytosis in the 20s.  At this time, do not feel it is reflective of infectious etiology.  Additionally, her delta troponin is negative.  Given that she has had 2 - high sensitive troponins, do not feel that this is representative of ACS etiology.    Patient is resting comfortably in the ED without any signs of distress.  She currently does not have any pain.  Patient stable for discharge home.  We will have her follow-up with [facility] of [place] for formal ultrasound.  Discussed patient with Dr. Rogene Houston who is agreeable to plan. At this time, patient exhibits no emergent life-threatening condition that require further evaluation in ED or admission. Patient had ample opportunity for questions and discussion. All patient's questions were answered with full understanding. Strict return precautions discussed. Patient expresses understanding and agreement to plan.   Portions of this note were generated with Lobbyist. Dictation errors may occur despite best attempts at proofreading.   Final Clinical Impressions(s) / ED Diagnoses   Final diagnoses:  Breast pain, right    ED Discharge Orders    None       Volanda Napoleon, PA-C 02/27/19 1653    Fredia Sorrow, MD 03/07/19 [phone]

## 2019-03-08 ENCOUNTER — Other Ambulatory Visit: Payer: Self-pay

## 2019-03-08 ENCOUNTER — Non-Acute Institutional Stay: Payer: Medicare Other | Admitting: Internal Medicine

## 2019-03-08 DIAGNOSIS — R410 Disorientation, unspecified: Secondary | ICD-10-CM

## 2019-03-08 DIAGNOSIS — Z515 Encounter for palliative care: Secondary | ICD-10-CM

## 2019-03-08 NOTE — Progress Notes (Signed)
Dec 1st, 2020 [facility] Note Telephone: [phone]  Fax: [phone]   PATIENT NAME: Carla Conway DOB: [birth year] MRN: [id number] pt's cell [phone] (but very hard of hearing) [place] [address] (fax # [phone]) Move in date 10/17/2017   PRIMARY CARE PROVIDER:   Lujean Amel, MD  [facility] [phone] ([place] 10/18/2018) Dr. Heath Lark (Oncology) patient called to cancel apt? (LOV 03/29/2018)   REFERRING PROVIDER:  Lujean Amel, MD [place],  [place] [address]   RESPONSIBLE PARTY: (daughter/HCPOA) [name] /dtr cell [phone] [email or web address]. Lorayne Bender Pra ([place]), Bertram Millard Pra ([place]), Zach Cleaster Corin Pra ([place]), [place] ([place])7/30: [facility] Pamella Pert PA-C: eval for Increased depression: d/c melatonin; non-usage. Increase of sertraline 137m qd    ASSESSMENT / RECOMMENDATIONS:  1.Advance Care Planning: A. Directives: Continues a full code; past experience with a successful resuscitation after a cardiopulmonary arrest r/t allergic reaction from chemo. HCPOA (dtr GGibraltarHarvey) and Living Will present on chart.   B. Goals of Care: Frustrated with COVID isolation. Hoping for better days ahead as restrictions lifted.   2. Functional and Cognitive status: Patient continues pleasantly conversant. Alert and oriented. Daughter GGibraltarhas shared that over the last few weeks patient was confused and had delusions that her daughter was against her. Now back to her baseline after treatment for a UTI. She has h/o past confusion with bladder infections. LPamella PertPA (psych) follows periodically. Stable on sertraline. She tends to keep to herself in her room. Patient continues independent in ADLs. Her weight 02/25/2019 was 155lbs, which has been stable. At a height of 5 ft 5.5" her BMI is about 26 kg /m2. We clipped down her long toenails again today. Daughter has  arranged for podiatry to visit beginning mid December.   3. Family Supports: Resident of [facility]. She had previously worked as a hManufacturing engineer Both her spouses are deceased. She raised her children essentially as a single mom. Daughter GGibraltarlives in town and is in frequent contact.   4. Follow up Palliative Care Visit: in 2-3 months (March 2021). Will call daughter GGibraltarwith updates.   I spent 30 minutes providing this consultation, from 2pm-2:30pm. More than 50% of the time in this consultation was spent coordinating communication.    HISTORY OF PRESENT ILLNESS:  Ms. MWilber Oliphantis an 85yo with h/o advanced dementia, depression (Sertaline), chronic lymphocytic leukemia (Dr. GAlvy Bimler prednisone). Past trial Rituximab with allergic reaction. R port a cath. No further treatment planned), CAD (stents), HTN, HLD, falls (2018, 2019).  02/27/2019: ER visit R breast pain; negative w/u. This is a Palliative care f/u visit from 10/28/2018.    CODE STATUS: Full   PPS: 60%  HOSPICE ELIGIBILITY/DIAGNOSIS: TBD   PAST MEDICAL HISTORY:  Past Medical History:  Diagnosis Date  . Asthma   . CAD (coronary artery disease)   . Falls frequently   . Hyperlipidemia   . Hypertension     SOCIAL HX:  Social History   Tobacco Use  . Smoking status: Former Smoker    Types: Cigarettes    Start date: 05/18/1983  . Smokeless tobacco: Never Used  Substance Use Topics  . Alcohol use: No    ALLERGIES:  Allergies  Allergen Reactions  . Rituximab Other (See Comments)    "She coded"/Upset stomach and blood pressure dropped     PERTINENT MEDICATIONS:  Outpatient Encounter Medications  as of 03/08/2019  Medication Sig  . acetaminophen (TYLENOL) 325 MG tablet Take 325 mg by mouth 3 (three) times daily as needed for mild pain.   Marland Kitchen acyclovir (ZOVIRAX) 400 MG tablet Take 1 tablet (400 mg total) by mouth daily.  Marland Kitchen amLODipine (NORVASC) 10 MG tablet Take 1 tablet (10 mg total) by  mouth daily.  Marland Kitchen aspirin EC 81 MG EC tablet Take 1 tablet (81 mg total) by mouth daily.  . hydrALAZINE (APRESOLINE) 25 MG tablet Take 1 tablet (25 mg total) by mouth 3 (three) times daily.  Marland Kitchen lovastatin (MEVACOR) 40 MG tablet Take 40 mg by mouth at bedtime.   . metoprolol tartrate (LOPRESSOR) 25 MG tablet Take 1 tablet (25 mg total) by mouth 2 (two) times daily.  Marland Kitchen omeprazole (PRILOSEC) 20 MG capsule Take 1 capsule (20 mg total) by mouth daily.  . potassium chloride (K-DUR) 10 MEQ tablet Take 10 mEq by mouth daily.  . predniSONE (DELTASONE) 5 MG tablet Take 1 tablet (5 mg total) by mouth daily with breakfast.  . sertraline (ZOLOFT) 100 MG tablet Take 150 mg by mouth daily.   . ciprofloxacin (CIPRO) 500 MG tablet Take 500 mg by mouth 2 (two) times daily.  . montelukast (SINGULAIR) 10 MG tablet Take 10 mg by mouth at bedtime.   No facility-administered encounter medications on file as of 03/08/2019.     PHYSICAL EXAM:   General: Well nourished, pleasantly conversant, no distress. A & O X 3 Pulmonary: clear ant fields Abdomen: soft, nontender, + bowel sounds GU: no suprapubic tenderness Extremities: no edema, no joint deformities Skin: no rashes Neurological: Weakness but otherwise non-focal  Julianne Handler, NP

## 2019-03-17 ENCOUNTER — Other Ambulatory Visit: Payer: Self-pay

## 2019-03-17 ENCOUNTER — Emergency Department (HOSPITAL_COMMUNITY): Payer: Medicare Other

## 2019-03-17 ENCOUNTER — Emergency Department (HOSPITAL_COMMUNITY)
Admission: EM | Admit: 2019-03-17 | Discharge: 2019-03-17 | Disposition: A | Discharge status: Home / Self Care | Payer: Medicare Other | Attending: Emergency Medicine | Admitting: Emergency Medicine

## 2019-03-17 ENCOUNTER — Encounter (HOSPITAL_COMMUNITY): Payer: Self-pay

## 2019-03-17 DIAGNOSIS — Y92128 Other place in nursing home as the place of occurrence of the external cause: Secondary | ICD-10-CM | POA: Diagnosis not present

## 2019-03-17 DIAGNOSIS — M545 Low back pain: Secondary | ICD-10-CM | POA: Diagnosis not present

## 2019-03-17 DIAGNOSIS — W01198A Fall on same level from slipping, tripping and stumbling with subsequent striking against other object, initial encounter: Secondary | ICD-10-CM | POA: Insufficient documentation

## 2019-03-17 DIAGNOSIS — Z87891 Personal history of nicotine dependence: Secondary | ICD-10-CM | POA: Insufficient documentation

## 2019-03-17 DIAGNOSIS — I5032 Chronic diastolic (congestive) heart failure: Secondary | ICD-10-CM | POA: Diagnosis not present

## 2019-03-17 DIAGNOSIS — I130 Hypertensive heart and chronic kidney disease with heart failure and stage 1 through stage 4 chronic kidney disease, or unspecified chronic kidney disease: Secondary | ICD-10-CM | POA: Insufficient documentation

## 2019-03-17 DIAGNOSIS — W19XXXA Unspecified fall, initial encounter: Secondary | ICD-10-CM

## 2019-03-17 DIAGNOSIS — J45909 Unspecified asthma, uncomplicated: Secondary | ICD-10-CM | POA: Insufficient documentation

## 2019-03-17 DIAGNOSIS — N183 Chronic kidney disease, stage 3 unspecified: Secondary | ICD-10-CM | POA: Insufficient documentation

## 2019-03-17 DIAGNOSIS — Z79899 Other long term (current) drug therapy: Secondary | ICD-10-CM | POA: Diagnosis not present

## 2019-03-17 DIAGNOSIS — S81012A Laceration without foreign body, left knee, initial encounter: Secondary | ICD-10-CM | POA: Insufficient documentation

## 2019-03-17 DIAGNOSIS — Y9389 Activity, other specified: Secondary | ICD-10-CM | POA: Insufficient documentation

## 2019-03-17 DIAGNOSIS — S0990XA Unspecified injury of head, initial encounter: Secondary | ICD-10-CM | POA: Diagnosis present

## 2019-03-17 DIAGNOSIS — Z7982 Long term (current) use of aspirin: Secondary | ICD-10-CM | POA: Diagnosis not present

## 2019-03-17 DIAGNOSIS — Y999 Unspecified external cause status: Secondary | ICD-10-CM | POA: Diagnosis not present

## 2019-03-17 LAB — CBC WITH DIFFERENTIAL/PLATELET
Abs Immature Granulocytes: 0.13 10*3/uL — ABNORMAL HIGH (ref 0.00–0.07)
Basophils Absolute: 0.1 10*3/uL (ref 0.0–0.1)
Basophils Relative: 0 %
Eosinophils Absolute: 0.1 10*3/uL (ref 0.0–0.5)
Eosinophils Relative: 2 %
HCT: 39.3 % (ref 36.0–46.0)
Hemoglobin: 12.3 g/dL (ref 12.0–15.0)
Immature Granulocytes: 0 %
Lymphocytes Relative: 72 %
Lymphs Abs: 22.1 10*3/uL — ABNORMAL HIGH (ref 0.7–4.0)
MCH: 28.3 pg (ref 26.0–34.0)
MCHC: 31.3 g/dL (ref 30.0–36.0)
MCV: 90.6 fL (ref 80.0–100.0)
Monocytes Absolute: 8 10*3/uL — ABNORMAL HIGH (ref 0.1–1.0)
Monocytes Relative: 18 %
Neutro Abs: 2.7 10*3/uL (ref 1.7–7.7)
Neutrophils Relative %: 8 %
Platelets: 180 10*3/uL (ref 150–400)
RBC: 4.34 MIL/uL (ref 3.87–5.11)
RDW: 15.9 % — ABNORMAL HIGH (ref 11.5–15.5)
WBC: 33.1 10*3/uL — ABNORMAL HIGH (ref 4.0–10.5)
nRBC: 0 % (ref 0.0–0.2)

## 2019-03-17 LAB — URINALYSIS, ROUTINE W REFLEX MICROSCOPIC
Bacteria, UA: NONE SEEN
Bilirubin Urine: NEGATIVE
Glucose, UA: NEGATIVE mg/dL
Hgb urine dipstick: NEGATIVE
Ketones, ur: NEGATIVE mg/dL
Leukocytes,Ua: NEGATIVE
Nitrite: NEGATIVE
Protein, ur: 30 mg/dL — AB
Specific Gravity, Urine: 1.02 (ref 1.005–1.030)
pH: 6 (ref 5.0–8.0)

## 2019-03-17 LAB — BASIC METABOLIC PANEL
Anion gap: 9 (ref 5–15)
BUN: 21 mg/dL (ref 8–23)
CO2: 27 mmol/L (ref 22–32)
Calcium: 8.7 mg/dL — ABNORMAL LOW (ref 8.9–10.3)
Chloride: 102 mmol/L (ref 98–111)
Creatinine, Ser: 1.07 mg/dL — ABNORMAL HIGH (ref 0.44–1.00)
GFR calc Af Amer: 53 mL/min — ABNORMAL LOW (ref >60)
GFR calc non Af Amer: 46 mL/min — ABNORMAL LOW (ref >60)
Glucose, Bld: 125 mg/dL — ABNORMAL HIGH (ref 70–99)
Potassium: 4.5 mmol/L (ref 3.5–5.1)
Sodium: 138 mmol/L (ref 135–145)

## 2019-03-17 NOTE — ED Triage Notes (Signed)
Pt arrives via [facility] from [facility] on [address]. Per EMS: Pt has had two falls today. Abrasion to left knee and low back pain reported. Pt denies head injury. Pt has dementia at baseline. Per EMS: Pt is at baseline. Pt arrives in [facility].

## 2019-03-17 NOTE — Discharge Instructions (Signed)
Follow-up with your family doctor.  Please discuss with them that you had multiple falls today.  Sometimes they would like to reevaluate by physical therapy to ensure that you are safe at home.  Please return for worsening symptoms.

## 2019-03-17 NOTE — ED Notes (Signed)
Steri-strips applied to pts left knee skin tear. Knee dressed with Telfa and kerlex

## 2019-03-17 NOTE — ED Notes (Signed)
Attempted to notify Carla Conway of pt return. No answer.

## 2019-03-17 NOTE — ED Notes (Signed)
Pt daughter notified pt is ready to be transported back to facility. Pts daughter is on the way to transport pt back.

## 2019-03-17 NOTE — ED Notes (Signed)
EDP at bedside  

## 2019-03-17 NOTE — ED Provider Notes (Signed)
[facility] DEPT Provider Note   CSN: [id number] Arrival date & time: 03/17/19  1122     History Chief Complaint  Patient presents with  . Fall    Carla Conway is a 85 y.o. female.  85 yo F with a chief complaints of a fall.  Patient reportedly had 2 falls today.  Patient is hard of hearing which limits history.  States that she was stepping onto the hallway and saw someone with a walker coming her direction she tried to quickly turn around and lost her balance and fell to the ground.  Landed on her left knee and her bottom.  Complaining mostly of cut to her left knee.  She denies head injury denies loss consciousness denies neck pain back pain chest pain abdominal pain.  Denies numbness or weakness to the legs.  Feels that her tetanus is likely up-to-date.  The history is provided by the patient and the EMS personnel.  Fall This is a new problem. The current episode started yesterday. The problem occurs constantly. The problem has not changed since onset.Pertinent negatives include no chest pain, no headaches and no shortness of breath. Nothing aggravates the symptoms. Nothing relieves the symptoms. She has tried nothing for the symptoms. The treatment provided no relief.       Past Medical History:  Diagnosis Date  . Asthma   . CAD (coronary artery disease)   . Falls frequently   . Hyperlipidemia   . Hypertension     Patient Active Problem List   Diagnosis Date Noted  . Preventive measure 12/29/2017  . Other fatigue 09/25/2017  . Malnutrition of moderate degree 07/20/2017  . Enterocolitis 07/18/2017  . Pancytopenia, acquired ([place]) 07/14/2017  . Malignant cachexia ([place]) 06/04/2017  . Hypokalemia 04/29/2017  . Left arm pain 04/29/2017  . TIA (transient ischemic attack) 04/29/2017  . Abdominal pain 04/28/2017  . Elevated CK 04/01/2017  . AKI (acute kidney injury) ([place]) 03/31/2017  . Dehydration 03/31/2017  . Generalized weakness  03/31/2017  . External hemorrhoids without complication 38/18/2993  . Port-A-Cath in place 01/06/2017  . Gastritis 01/06/2017  . Poor memory 12/11/2016  . Dysuria 11/13/2016  . Anaphylaxis 10/29/2016  . Anaphylactoid reaction 10/29/2016  . Goals of care, counseling/discussion 10/28/2016  . Chronic kidney disease, stage III (moderate) ([place]) 10/17/2016  . 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 Neutropenia ([place]) 09/23/2016  . CLL (chronic lymphocytic leukemia) ([place]) 05/11/2016  . Acute on chronic diastolic heart failure ([employer]) 05/10/2016  . Influenza A 05/09/2016  . Lymphadenopathy of head and neck region 05/09/2016  . Lymphadenopathy, thoracic 05/09/2016  . COPD (chronic obstructive pulmonary disease) ([place]) 05/09/2016  . Debility 05/09/2016  . Syncopal episodes 05/09/2016  . Syncope 05/08/2016  . HTN (hypertension) 05/18/2013  . CAD in native artery 05/18/2013    Past Surgical History:  Procedure Laterality Date  . CATARACT EXTRACTION    . CORONARY ANGIOPLASTY  10 -15 years ago   stent in [place] ,[place]  . IR FLUORO GUIDE PORT INSERTION RIGHT  10/23/2016  . IR REMOVAL TUN ACCESS W/ PORT W/O FL MOD SED  02/05/2018  . IR US GUIDE VASC ACCESS RIGHT  10/23/2016     OB History   No obstetric history on file.     Family History  Problem Relation Age of Onset  . Cancer Father        colon    Social History   Tobacco Use  . Smoking status: Former Smoker  Types: Cigarettes    Start date: 05/18/1983  . Smokeless tobacco: Never Used  Substance Use Topics  . Alcohol use: No  . Drug use: No    Home Medications Prior to Admission medications   Medication Sig Start Date End Date Taking? Authorizing Provider  acetaminophen (TYLENOL) 325 MG tablet Take 325 mg by mouth 3 (three) times daily as needed for mild pain.     [provider]  acyclovir (ZOVIRAX) 400 MG tablet Take 1 tablet (400 mg total) by mouth daily. 06/04/17   Heath Lark, MD  amLODipine  (NORVASC) 10 MG tablet Take 1 tablet (10 mg total) by mouth daily. 11/24/17   Roxan Hockey, MD  aspirin EC 81 MG EC tablet Take 1 tablet (81 mg total) by mouth daily. 05/02/17   Oswald Hillock, MD  ciprofloxacin (CIPRO) 500 MG tablet Take 500 mg by mouth 2 (two) times daily. 02/23/19   [provider]  hydrALAZINE (APRESOLINE) 25 MG tablet Take 1 tablet (25 mg total) by mouth 3 (three) times daily. 11/23/17 12/23/19  Roxan Hockey, MD  lovastatin (MEVACOR) 40 MG tablet Take 40 mg by mouth at bedtime.  05/09/13   [provider]  metoprolol tartrate (LOPRESSOR) 25 MG tablet Take 1 tablet (25 mg total) by mouth 2 (two) times daily. 11/23/17   Roxan Hockey, MD  montelukast (SINGULAIR) 10 MG tablet Take 10 mg by mouth at bedtime. 05/15/17   [provider]  omeprazole (PRILOSEC) 20 MG capsule Take 1 capsule (20 mg total) by mouth daily. 01/06/17   Heath Lark, MD  potassium chloride (K-DUR) 10 MEQ tablet Take 10 mEq by mouth daily. 03/24/16   [provider]  predniSONE (DELTASONE) 5 MG tablet Take 1 tablet (5 mg total) by mouth daily with breakfast. 03/29/18   Heath Lark, MD  sertraline (ZOLOFT) 100 MG tablet Take 150 mg by mouth daily.     [provider]    Allergies    Rituximab  Review of Systems   Review of Systems  Constitutional: Negative for chills and fever.  HENT: Negative for congestion and rhinorrhea.   Eyes: Negative for redness and visual disturbance.  Respiratory: Negative for shortness of breath and wheezing.   Cardiovascular: Negative for chest pain and palpitations.  Gastrointestinal: Negative for nausea and vomiting.  Genitourinary: Negative for dysuria and urgency.  Musculoskeletal: Negative for arthralgias and myalgias.  Skin: Positive for wound. Negative for pallor.  Neurological: Negative for dizziness and headaches.    Physical Exam Updated Vital Signs BP 135/61 (BP Location: Right Arm)   Pulse 65   Temp 98 F (36.7  C) (Oral)   Resp 18   Wt 71 kg   SpO2 96%   BMI 25.26 kg/m   Physical Exam Vitals and nursing note reviewed.  Constitutional:      General: She is not in acute distress.    Appearance: She is well-developed. She is not diaphoretic.  HENT:     Head: Normocephalic and atraumatic.  Eyes:     Pupils: Pupils are equal, round, and reactive to light.  Cardiovascular:     Rate and Rhythm: Normal rate and regular rhythm.     Heart sounds: No murmur. No friction rub. No gallop.   Pulmonary:     Effort: Pulmonary effort is normal.     Breath sounds: No wheezing or rales.  Abdominal:     General: There is no distension.     Palpations: Abdomen is soft.  Tenderness: There is no abdominal tenderness.  Musculoskeletal:        General: No tenderness.     Cervical back: Normal range of motion and neck supple.     Comments: Skin tear overlying the left patella.  Full range of motion of the knee without pain.  She has some mild diffuse low back pain.  No midline spinal tenderness.  No midline C-spine tenderness is able to rotate her head 45 degrees in either direction without pain.  No signs of trauma to the head.  Skin:    General: Skin is warm and dry.  Neurological:     Mental Status: She is alert and oriented to person, place, and time.  Psychiatric:        Behavior: Behavior normal.     ED Results / Procedures / Treatments   Labs (all labs ordered are listed, but only abnormal results are displayed) Labs Reviewed  CBC WITH DIFFERENTIAL/PLATELET - Abnormal; Notable for the following components:      Result Value   WBC 33.1 (*)    RDW 15.9 (*)    Lymphs Abs 22.1 (*)    Monocytes Absolute 8.0 (*)    Abs Immature Granulocytes 0.13 (*)    All other components within normal limits  BASIC METABOLIC PANEL - Abnormal; Notable for the following components:   Glucose, Bld 125 (*)    Creatinine, Ser 1.07 (*)    Calcium 8.7 (*)    GFR calc non Af Amer 46 (*)    GFR calc Af Amer 53  (*)    All other components within normal limits  URINALYSIS, ROUTINE W REFLEX MICROSCOPIC - Abnormal; Notable for the following components:   Protein, ur 30 (*)    All other components within normal limits    EKG None  Radiology DG Lumbar Spine Complete  Result Date: 03/17/2019 CLINICAL DATA:  [place] twice today. Back pain. EXAM: LUMBAR SPINE - COMPLETE 4+ VIEW COMPARISON:  CT abdomen 07/18/2017 FINDINGS: Curvature convex to the right with the apex at L2. No evidence of regional fracture. Chronic degenerative disc disease and degenerative facet disease. Extensive arterial calcification incidentally noted. IMPRESSION: No acute or traumatic finding. Curvature convex to the right with the apex at L2. e Extensive chronic degenerative disc disease and degenerative facet disease. Electronically Signed   By: Nelson Chimes M.D.   On: 03/17/2019 14:10   CT Head Wo Contrast  Result Date: 03/17/2019 CLINICAL DATA:  Status post fall today. EXAM: CT HEAD WITHOUT CONTRAST TECHNIQUE: Contiguous axial images were obtained from the base of the skull through the vertex without intravenous contrast. COMPARISON:  November 21, 2017 FINDINGS: Brain: No evidence of acute infarction, hemorrhage, hydrocephalus, extra-axial collection or mass lesion/mass effect. There is chronic diffuse atrophy. Chronic bilateral periventricular white matter small vessel ischemic changes identified. Vascular: No hyperdense vessel is noted. Skull: Normal. Negative for fracture or focal lesion. Sinuses/Orbits: Previous sinus surgery unchanged compared prior exam. The orbits are normal. Other: None. IMPRESSION: 1. No focal acute intracranial abnormality identified. 2. Chronic diffuse atrophy. Chronic bilateral periventricular white matter small vessel ischemic change. Electronically Signed   By: Abelardo Diesel M.D.   On: 03/17/2019 14:19    Procedures Procedures (including critical care time)  Medications Ordered in ED Medications - No data  to display  ED Course  I have reviewed the triage vital signs and the nursing notes.  Pertinent labs & imaging results that were available during my care of the patient were  reviewed by me and considered in my medical decision making (see chart for details).    MDM Rules/Calculators/A&P     CHA2DS2/VAS Stroke Risk Points      N/A >= 2 Points: High Risk  1 - 1.99 Points: Medium Risk  0 Points: Low Risk    A final score could not be computed because of missing components.: Last  Change: N/A     This score determines the patient's risk of having a stroke if the  patient has atrial fibrillation.      This score is not applicable to this patient. Components are not  calculated.                   85 yo F with a chief complaints of a fall from standing.  This occurred x2 this morning.  The event that she is able to describe to me sounds nonsyncopal.  Because it happened twice to her today and she has some difficulty with history will obtain lab work EKG.  She has a history of cancer and so obtain a CT scan of the head to evaluate for mets.  Steri strip wound at bedside.   Patient's imaging of her head is negative.  UA is negative for infection.  Lab work with no obvious cause for syncope no significant anemia no electrolyte abnormality.  Will discharge the patient home PCP follow-up.  2:28 PM:  I have discussed the diagnosis/risks/treatment options with the patient and believe the pt to be eligible for discharge home to follow-up with PCP. We also discussed returning to the ED immediately if new or worsening sx occur. We discussed the sx which are most concerning (e.g., sudden worsening pain, fever, inability to tolerate by mouth) that necessitate immediate return. Medications administered to the patient during their visit and any new prescriptions provided to the patient are listed below.  Medications given during this visit Medications - No data to display   The patient appears  reasonably screen and/or stabilized for discharge and I doubt any other medical condition or other [facility] requiring further screening, evaluation, or treatment in the ED at this time prior to discharge.   Final Clinical Impression(s) / ED Diagnoses Final diagnoses:  Fall, initial encounter  Knee laceration, left, initial encounter    Rx / DC Orders ED Discharge Orders    None       Deno Etienne, DO 03/17/19 1428

## 2019-03-25 ENCOUNTER — Encounter (HOSPITAL_COMMUNITY): Payer: Self-pay | Admitting: Emergency Medicine

## 2019-03-25 ENCOUNTER — Emergency Department (HOSPITAL_COMMUNITY): Payer: Medicare Other

## 2019-03-25 ENCOUNTER — Other Ambulatory Visit: Payer: Self-pay

## 2019-03-25 ENCOUNTER — Emergency Department (HOSPITAL_COMMUNITY)
Admission: EM | Admit: 2019-03-25 | Discharge: 2019-03-25 | Disposition: A | Discharge status: Home / Self Care | Payer: Medicare Other | Attending: Emergency Medicine | Admitting: Emergency Medicine

## 2019-03-25 DIAGNOSIS — J45909 Unspecified asthma, uncomplicated: Secondary | ICD-10-CM | POA: Insufficient documentation

## 2019-03-25 DIAGNOSIS — Z7982 Long term (current) use of aspirin: Secondary | ICD-10-CM | POA: Insufficient documentation

## 2019-03-25 DIAGNOSIS — W19XXXA Unspecified fall, initial encounter: Secondary | ICD-10-CM

## 2019-03-25 DIAGNOSIS — Z8673 Personal history of transient ischemic attack (TIA), and cerebral infarction without residual deficits: Secondary | ICD-10-CM | POA: Insufficient documentation

## 2019-03-25 DIAGNOSIS — T148XXA Other injury of unspecified body region, initial encounter: Secondary | ICD-10-CM

## 2019-03-25 DIAGNOSIS — Y999 Unspecified external cause status: Secondary | ICD-10-CM | POA: Diagnosis not present

## 2019-03-25 DIAGNOSIS — Y939 Activity, unspecified: Secondary | ICD-10-CM | POA: Diagnosis not present

## 2019-03-25 DIAGNOSIS — N183 Chronic kidney disease, stage 3 unspecified: Secondary | ICD-10-CM | POA: Diagnosis not present

## 2019-03-25 DIAGNOSIS — W06XXXA Fall from bed, initial encounter: Secondary | ICD-10-CM | POA: Insufficient documentation

## 2019-03-25 DIAGNOSIS — I2510 Atherosclerotic heart disease of native coronary artery without angina pectoris: Secondary | ICD-10-CM | POA: Diagnosis not present

## 2019-03-25 DIAGNOSIS — F0390 Unspecified dementia without behavioral disturbance: Secondary | ICD-10-CM | POA: Insufficient documentation

## 2019-03-25 DIAGNOSIS — I130 Hypertensive heart and chronic kidney disease with heart failure and stage 1 through stage 4 chronic kidney disease, or unspecified chronic kidney disease: Secondary | ICD-10-CM | POA: Insufficient documentation

## 2019-03-25 DIAGNOSIS — Z79899 Other long term (current) drug therapy: Secondary | ICD-10-CM | POA: Insufficient documentation

## 2019-03-25 DIAGNOSIS — I5032 Chronic diastolic (congestive) heart failure: Secondary | ICD-10-CM | POA: Diagnosis not present

## 2019-03-25 DIAGNOSIS — Y92122 Bedroom in nursing home as the place of occurrence of the external cause: Secondary | ICD-10-CM | POA: Diagnosis not present

## 2019-03-25 DIAGNOSIS — Z87891 Personal history of nicotine dependence: Secondary | ICD-10-CM | POA: Diagnosis not present

## 2019-03-25 NOTE — ED Triage Notes (Signed)
Pt from [facility] on [address]; pt had unwitnessed fall. Pt c/o back pain, R hip pain and R knee pain. Pt has multiple lacerations on L arm.   Pt hx of dementia and currently being treated for UTI

## 2019-03-25 NOTE — ED Notes (Signed)
[facility] called for patient to take her back to the nursing home.

## 2019-03-25 NOTE — ED Notes (Addendum)
Patient's daughter, [name], would like to take her home, her number is [phone].

## 2019-03-25 NOTE — ED Notes (Signed)
Bandage replaced on L arm, skin tear cleaned, new dressing applied. Bleeding controlled at this time.

## 2019-03-25 NOTE — Discharge Instructions (Addendum)
As discussed, your evaluation today has been largely reassuring.  But, it is important that you monitor your condition carefully, and do not hesitate to return to the ED if you develop new, or concerning changes in your condition. ? ?Otherwise, please follow-up with your physician for appropriate ongoing care. ? ?

## 2019-03-25 NOTE — ED Provider Notes (Signed)
[facility] DEPT Provider Note   CSN: [id number] Arrival date & time: 03/25/19  1327     History Chief Complaint  Patient presents with  . Fall    Naina Sleeper is a 85 y.o. female.  HPI    Patient presents after a fall. The patient has a very poor hearing, provides only details in brief in regards to her current situation.  She complains of back pain, unclear severity, worse with motion. She notes that she fell from a bed, striking her head, back. It does not seem as though there was loss of consciousness.  She denies any weakness.  She has a history of frequent falls, as well as a history of dementia  Level 5 caveat secondary to dementia.  Past Medical History:  Diagnosis Date  . Asthma   . CAD (coronary artery disease)   . Falls frequently   . Hyperlipidemia   . Hypertension     Patient Active Problem List   Diagnosis Date Noted  . Preventive measure 12/29/2017  . Other fatigue 09/25/2017  . Malnutrition of moderate degree 07/20/2017  . Enterocolitis 07/18/2017  . Pancytopenia, acquired ([place]) 07/14/2017  . Malignant cachexia ([place]) 06/04/2017  . Hypokalemia 04/29/2017  . Left arm pain 04/29/2017  . TIA (transient ischemic attack) 04/29/2017  . Abdominal pain 04/28/2017  . Elevated CK 04/01/2017  . AKI (acute kidney injury) ([place]) 03/31/2017  . Dehydration 03/31/2017  . Generalized weakness 03/31/2017  . External hemorrhoids without complication 93/57/0177  . Port-A-Cath in place 01/06/2017  . Gastritis 01/06/2017  . Poor memory 12/11/2016  . Dysuria 11/13/2016  . Anaphylaxis 10/29/2016  . Anaphylactoid reaction 10/29/2016  . Goals of care, counseling/discussion 10/28/2016  . Chronic kidney disease, stage III (moderate) ([place]) 10/17/2016  . Lymphoma, small lymphocytic ([place]) 10/15/2016  . Neutropenia ([place]) 09/23/2016  . CLL (chronic lymphocytic leukemia) ([place]) 05/11/2016  . Acute on chronic diastolic heart failure ([place])  05/10/2016  . Influenza A 05/09/2016  . Lymphadenopathy of head and neck region 05/09/2016  . Lymphadenopathy, thoracic 05/09/2016  . COPD (chronic obstructive pulmonary disease) ([place]) 05/09/2016  . Debility 05/09/2016  . Syncopal episodes 05/09/2016  . Syncope 05/08/2016  . HTN (hypertension) 05/18/2013  . CAD in native artery 05/18/2013    Past Surgical History:  Procedure Laterality Date  . CATARACT EXTRACTION    . CORONARY ANGIOPLASTY  10 -15 years ago   stent in [place] ,[place]  . IR FLUORO GUIDE PORT INSERTION RIGHT  10/23/2016  . IR REMOVAL TUN ACCESS W/ PORT W/O FL MOD SED  02/05/2018  . IR US GUIDE VASC ACCESS RIGHT  10/23/2016     OB History   No obstetric history on file.     Family History  Problem Relation Age of Onset  . Cancer Father        colon    Social History   Tobacco Use  . Smoking status: Former Smoker    Types: Cigarettes    Start date: 05/18/1983  . Smokeless tobacco: Never Used  Substance Use Topics  . Alcohol use: No  . Drug use: No    Home Medications Prior to Admission medications   Medication Sig Start Date End Date Taking? Authorizing Provider  acetaminophen (TYLENOL) 325 MG tablet Take 325 mg by mouth daily as needed for mild pain.    Yes [provider]  acyclovir (ZOVIRAX) 400 MG tablet Take 1 tablet (400 mg total) by mouth daily. 06/04/17  Yes Gorsuch, Ni,  MD  amLODipine (NORVASC) 10 MG tablet Take 1 tablet (10 mg total) by mouth daily. 11/24/17  Yes Emokpae, Courage, MD  aspirin EC 81 MG EC tablet Take 1 tablet (81 mg total) by mouth daily. 05/02/17  Yes Darrick Meigs, Marge Duncans, MD  cephALEXin (KEFLEX) 500 MG capsule Take 500 mg by mouth 3 (three) times daily. 03/24/19 04/01/19 Yes [provider]  hydrALAZINE (APRESOLINE) 25 MG tablet Take 1 tablet (25 mg total) by mouth 3 (three) times daily. 11/23/17 12/23/19 Yes Emokpae, Courage, MD  HYDROcodone-acetaminophen (NORCO/VICODIN) 5-325 MG tablet Take 1 tablet by mouth every 8  (eight) hours as needed for pain. 03/23/19  Yes [provider]  lovastatin (MEVACOR) 40 MG tablet Take 40 mg by mouth at bedtime.  05/09/13  Yes [provider]  metoprolol tartrate (LOPRESSOR) 25 MG tablet Take 1 tablet (25 mg total) by mouth 2 (two) times daily. 11/23/17  Yes Emokpae, Courage, MD  montelukast (SINGULAIR) 10 MG tablet Take 10 mg by mouth at bedtime. 05/15/17  Yes [provider]  omeprazole (PRILOSEC) 20 MG capsule Take 1 capsule (20 mg total) by mouth daily. 01/06/17  Yes Gorsuch, Ni, MD  potassium chloride (K-DUR) 10 MEQ tablet Take 10 mEq by mouth daily. 03/24/16  Yes [provider]  predniSONE (DELTASONE) 5 MG tablet Take 1 tablet (5 mg total) by mouth daily with breakfast. 03/29/18  Yes Gorsuch, Ni, MD  sertraline (ZOLOFT) 100 MG tablet Take 150 mg by mouth daily.    Yes [provider]    Allergies    Rituximab  Review of Systems   Review of Systems  Unable to perform ROS: Dementia    Physical Exam Updated Vital Signs BP 125/72   Pulse 75   Temp 97.9 F (36.6 C) (Oral)   Resp 18   Ht 5' 6"  (1.676 m)   Wt 70.3 kg   SpO2 95%   BMI 25.02 kg/m   Physical Exam Vitals and nursing note reviewed.  Constitutional:      General: She is not in acute distress.    Appearance: She is well-developed.     Comments: Elderly female cervical collar, awake, alert very poor hearing.  HENT:     Head: Normocephalic and atraumatic.  Eyes:     Conjunctiva/sclera: Conjunctivae normal.  Cardiovascular:     Rate and Rhythm: Normal rate and regular rhythm.     Pulses: Normal pulses.  Pulmonary:     Effort: Pulmonary effort is normal. No respiratory distress.     Breath sounds: Normal breath sounds. No stridor.  Abdominal:     General: There is no distension.  Musculoskeletal:        General: No deformity.     Comments: No response, patient moves all extremities spontaneously, has preserved capacity Flexi-Tip independently, pelvis  is stable.  Skin:    General: Skin is warm and dry.     Comments: Multiple wounds in the left arm, upper and lower, hemostatic with gauze applied.  Neurological:     Mental Status: She is alert.     Cranial Nerves: Cranial nerve deficit present.     Motor: Atrophy present. No tremor or abnormal muscle tone.     Comments: Hearing capacity is minimal, otherwise cranial nerves essentially unremarkable, speech is brief, clear.     ED Results / Procedures / Treatments   Labs (all labs ordered are listed, but only abnormal results are displayed) Labs Reviewed - No data to display  EKG None  Radiology DG Lumbar Spine Complete  Result Date: 03/25/2019 CLINICAL DATA:  Low back pain after fall today. EXAM: LUMBAR SPINE - COMPLETE 4+ VIEW COMPARISON:  March 17, 2019. FINDINGS: Minimal grade 1 retrolisthesis of L2-3 is noted secondary to severe degenerative disc disease at this level. Old T12 fracture is again noted. Severe degenerative disc disease is also noted at L1-2, L3-4 and L4-5. Atherosclerosis of abdominal aorta is noted. No acute fracture is noted. IMPRESSION: Aortic atherosclerosis. Stable severe multilevel degenerative disc disease. Old T12 fracture. No acute abnormality seen in the lumbar spine. Electronically Signed   By: Marijo Conception M.D.   On: 03/25/2019 14:38   CT Head Wo Contrast  Result Date: 03/25/2019 CLINICAL DATA:  Unwitnessed fall.  Dementia. EXAM: CT HEAD WITHOUT CONTRAST CT CERVICAL SPINE WITHOUT CONTRAST TECHNIQUE: Multidetector CT imaging of the head and cervical spine was performed following the standard protocol without intravenous contrast. Multiplanar CT image reconstructions of the cervical spine were also generated. COMPARISON:  Head/cervical spine CT 11/19/2017 FINDINGS: CT HEAD FINDINGS Brain: Ventricles and cisterns are normal. There is mild age related atrophic change as well as moderate chronic ischemic microvascular disease. There is no mass, mass  effect, shift of midline structures or acute hemorrhage. No evidence to suggest acute infarction. Vascular: No hyperdense vessel or unexpected calcification. Skull: No acute fracture. Sinuses/Orbits: Mild chronic sinus inflammatory change involving the right maxillary sinus. Suggestion previous surgery of the right maxillary sinus with fenestration of the medial wall. Mastoid air cells are clear. Orbits are normal. Other: None. CT CERVICAL SPINE FINDINGS Alignment: Straightening of the normal cervical lordosis. No posttraumatic subluxation. Skull base and vertebrae: Moderate spondylosis of the cervical spine. Vertebral body heights are maintained. Atlantoaxial articulation is unremarkable. There is moderate uncovertebral joint spurring and facet arthropathy. No evidence of acute fracture or subluxation. Bilateral neural foraminal narrowing at the C3-4 level right worse than left. Mild bilateral neural foraminal narrowing at the C4-5 level and moderate bilateral neural foraminal narrowing at the C5-6 level as well as C6-7 levels. Soft tissues and spinal canal: No prevertebral fluid or swelling. No visible canal hematoma. Disc levels: Moderate disc space narrowing from the C3-4 level to the C6-7 level. Upper chest: No acute findings. Other: None. IMPRESSION: 1.  No acute brain injury. 2. Moderate chronic ischemic microvascular disease and mild age related atrophic change. 3.  No acute cervical spine injury. 4. Moderate spondylosis of the cervical spine with multilevel disc disease and multilevel neural foraminal narrowing as described. 5. Mild chronic sinus inflammatory change involving the right maxillary sinus. Suggestion previous right maxillary sinus surgery. Electronically Signed   By: Marin Olp M.D.   On: 03/25/2019 14:56   CT Cervical Spine Wo Contrast  Result Date: 03/25/2019 CLINICAL DATA:  Unwitnessed fall.  Dementia. EXAM: CT HEAD WITHOUT CONTRAST CT CERVICAL SPINE WITHOUT CONTRAST TECHNIQUE:  Multidetector CT imaging of the head and cervical spine was performed following the standard protocol without intravenous contrast. Multiplanar CT image reconstructions of the cervical spine were also generated. COMPARISON:  Head/cervical spine CT 11/19/2017 FINDINGS: CT HEAD FINDINGS Brain: Ventricles and cisterns are normal. There is mild age related atrophic change as well as moderate chronic ischemic microvascular disease. There is no mass, mass effect, shift of midline structures or acute hemorrhage. No evidence to suggest acute infarction. Vascular: No hyperdense vessel or unexpected calcification. Skull: No acute fracture. Sinuses/Orbits: Mild chronic sinus inflammatory change involving the right maxillary sinus. Suggestion previous surgery of the right maxillary sinus  with fenestration of the medial wall. Mastoid air cells are clear. Orbits are normal. Other: None. CT CERVICAL SPINE FINDINGS Alignment: Straightening of the normal cervical lordosis. No posttraumatic subluxation. Skull base and vertebrae: Moderate spondylosis of the cervical spine. Vertebral body heights are maintained. Atlantoaxial articulation is unremarkable. There is moderate uncovertebral joint spurring and facet arthropathy. No evidence of acute fracture or subluxation. Bilateral neural foraminal narrowing at the C3-4 level right worse than left. Mild bilateral neural foraminal narrowing at the C4-5 level and moderate bilateral neural foraminal narrowing at the C5-6 level as well as C6-7 levels. Soft tissues and spinal canal: No prevertebral fluid or swelling. No visible canal hematoma. Disc levels: Moderate disc space narrowing from the C3-4 level to the C6-7 level. Upper chest: No acute findings. Other: None. IMPRESSION: 1.  No acute brain injury. 2. Moderate chronic ischemic microvascular disease and mild age related atrophic change. 3.  No acute cervical spine injury. 4. Moderate spondylosis of the cervical spine with multilevel disc  disease and multilevel neural foraminal narrowing as described. 5. Mild chronic sinus inflammatory change involving the right maxillary sinus. Suggestion previous right maxillary sinus surgery. Electronically Signed   By: Marin Olp M.D.   On: 03/25/2019 14:56    Procedures Procedures (including critical care time)  Medications Ordered in ED Medications - No data to display  ED Course  I have reviewed the triage vital signs and the nursing notes.  Pertinent labs & imaging results that were available during my care of the patient were reviewed by me and considered in my medical decision making (see chart for details).    MDM Rules/Calculators/A&P                      3:04 PM On repeat exam the patient is in similar condition, no distress per She has had rebandaging of her wounds, which are hemostatic, with no repairable lesions, though she does have a large skin tear in the left elbow region. CT scans, x-ray reviewed, no notable findings. On removal of the patient cervical collar, her mood improves substantially. She has no new complaints, no evidence for distress, with reassuring findings following her fall she was discharged to her nursing facility. Final Clinical Impression(s) / ED Diagnoses Final diagnoses:  Fall, initial encounter  Multiple skin tears     Carmin Muskrat, MD 03/25/19 1504

## 2019-05-20 ENCOUNTER — Non-Acute Institutional Stay: Payer: Medicare Other | Admitting: Internal Medicine

## 2019-05-20 ENCOUNTER — Encounter: Payer: Self-pay | Admitting: Internal Medicine

## 2019-05-20 ENCOUNTER — Other Ambulatory Visit: Payer: Self-pay

## 2019-05-20 VITALS — Wt 135.0 lb

## 2019-05-20 DIAGNOSIS — Z7189 Other specified counseling: Secondary | ICD-10-CM

## 2019-05-20 DIAGNOSIS — Z515 Encounter for palliative care: Secondary | ICD-10-CM

## 2019-05-20 NOTE — Progress Notes (Signed)
Feb 12th, 2021 [facility] Note Telephone: [phone]  Fax: [phone]   PATIENT NAME: Carla Conway DOB: 07-05-1926 MRN: [id number] pt's cell [phone] (but very hard of hearing) [place] [address] (fax # [phone]) Move in date 10/17/2017   PRIMARY CARE PROVIDER:   Lujean Amel, MD  [facility] [phone] ([place] 10/18/2018) Dr. Heath Lark (Oncology) patient called to cancel apt? (LOV 03/29/2018)   REFERRING PROVIDER:  Lujean Amel, MD [facility] [address],  [place] [address]   RESPONSIBLE PARTY: (daughter/HCPOA) Carla Conway /dtr cell [phone] [email or web address]. Carla Conway ([place]), Carla Conway ([place]), Carla Conway ([place]), Suzette Fox ([place])  ASSESSMENT / RECOMMENDATIONS:  1. Advance Care Planning: A. Directives: Continues a full code; past experience with a successful resuscitation after a cardiopulmonary arrest r/t allergic reaction from chemo. HCPOA (dtr Carla Conway) and Living Will present on chart. I plan to readdress this with patient my next visit    B. Goals of Care: Continues frustrated with COVID isolation. Hoping for better days ahead as restrictions lifted.   2. Functional and Cognitive status: Staff report patient more consistently confused. Past hallucinations seem to be improved. She prefers to keep to her own company in her room. Today she was pleasantly conversant. She reminisced about her deceased mother, and was able to accurately count how many years ago her mom had dies. She also remembered her daughter's name, and close relationship. As usual, we couldn't find her hearing aids. I had to shout loudly for her to hear me. Daughter Carla reports patient tends to stay in bed and is resistant to participating with Physical Therapy. Staff have her on a toileting schedule which is keeping her continent. In December she was sent to the ER twice  for falls during which she struck her head. Head and cervical CT scans were reassuring. Staff report no recent falls, but that may be d/t her staying in bed most of the day. Patient's weight (05/05/2019) was 134.9lbs, which was a decrease of 20 lbs (13% of body weight) over the last 3 months. At a height of 5'5" her BMI is 22.5kg/m2. She has a h/o CLL; in the past no further treatment was planned. Now with the weight loss I'll double check with Carla that this is still the plan.  3. Family Supports: (from previous note) Resident of [place] She had previously worked as a Engineer, maintenance. Both her spouses are deceased. She raised her children essentially as a single mom. Daughter Carla lives in town and is in frequent contact.    4. Follow up Palliative Care Visit: Monday March 15 @3pm , ZOOM with Carla Conway (daughter). I plan to revisit Advance Care Directives at that time.  I spent 30 minutes providing this consultation, from 2pm-2:30pm. More than 50% of the time in this consultation was spent coordinating communication.    HISTORY OF PRESENT ILLNESS:  Ms. Carla Conway is an 86 yo with h/o advanced dementia, depression (Sertaline), chronic lymphocytic leukemia (Dr. Alvy Bimler; prednisone. Last seen 03/2018; no further treatment planned. Past trial Rituximab with allergic reaction), CAD (stents), HTN, HLD, falls (2018, 2019).  02/27/2019: ER visit R breast pain; negative w/u. 03/17/2019: ER visit fall (d/t loss of balance as turning) CT head and cervical spine reassuring. 03/25/2019 ER visit fall (from bed; struck back of head). Scans reassuring. This is a  Palliative care f/u visit from 03/08/2019.    CODE STATUS: Full   PPS: 60%   HOSPICE ELIGIBILITY/DIAGNOSIS: TBD  PAST MEDICAL HISTORY:  Past Medical History:  Diagnosis Date  . Asthma   . CAD (coronary artery disease)   . Falls frequently   . Hyperlipidemia   . Hypertension     SOCIAL HX:  Social History    Tobacco Use  . Smoking status: Former Smoker    Types: Cigarettes    Start date: 05/18/1983  . Smokeless tobacco: Never Used  Substance Use Topics  . Alcohol use: No    ALLERGIES:  Allergies  Allergen Reactions  . Rituximab Other (See Comments)    "She coded"/Upset stomach and blood pressure dropped     PERTINENT MEDICATIONS:  Outpatient Encounter Medications as of 05/20/2019  Medication Sig  . acetaminophen (TYLENOL) 325 MG tablet Take 325 mg by mouth daily as needed for mild pain.   Marland Kitchen acyclovir (ZOVIRAX) 400 MG tablet Take 1 tablet (400 mg total) by mouth daily.  Marland Kitchen amLODipine (NORVASC) 10 MG tablet Take 1 tablet (10 mg total) by mouth daily.  Marland Kitchen aspirin EC 81 MG EC tablet Take 1 tablet (81 mg total) by mouth daily.  . hydrALAZINE (APRESOLINE) 25 MG tablet Take 1 tablet (25 mg total) by mouth 3 (three) times daily.  Marland Kitchen HYDROcodone-acetaminophen (NORCO/VICODIN) 5-325 MG tablet Take 1 tablet by mouth every 8 (eight) hours as needed for pain.  Marland Kitchen lovastatin (MEVACOR) 40 MG tablet Take 40 mg by mouth at bedtime.   . metoprolol tartrate (LOPRESSOR) 25 MG tablet Take 1 tablet (25 mg total) by mouth 2 (two) times daily.  . montelukast (SINGULAIR) 10 MG tablet Take 10 mg by mouth at bedtime.  Marland Kitchen omeprazole (PRILOSEC) 20 MG capsule Take 1 capsule (20 mg total) by mouth daily.  . potassium chloride (K-DUR) 10 MEQ tablet Take 10 mEq by mouth daily.  . predniSONE (DELTASONE) 5 MG tablet Take 1 tablet (5 mg total) by mouth daily with breakfast.  . sertraline (ZOLOFT) 100 MG tablet Take 150 mg by mouth daily.    No facility-administered encounter medications on file as of 05/20/2019.    PHYSICAL EXAM:   General: Thin, frail, pleasantly conversant, no distress. A & O X 3. Knew she was in [place]; remembered name after I told her.  Limited exam d/t COVID precautions Extremities: no edema, no joint deformities. Changes of LE venous status. Bruising Extremties. Neurological: Weakness but otherwise  non-focal  Julianne Handler, NP

## 2019-06-04 ENCOUNTER — Inpatient Hospital Stay (HOSPITAL_COMMUNITY)
Admission: EM | Admit: 2019-06-04 | Discharge: 2019-06-17 | DRG: 438 | Disposition: A | Discharge status: Skilled Nursing Facility | Payer: Medicare Other | Source: Skilled Nursing Facility | Attending: Internal Medicine | Admitting: Internal Medicine

## 2019-06-04 ENCOUNTER — Emergency Department (HOSPITAL_COMMUNITY): Payer: Medicare Other

## 2019-06-04 ENCOUNTER — Encounter (HOSPITAL_COMMUNITY): Payer: Self-pay | Admitting: Emergency Medicine

## 2019-06-04 ENCOUNTER — Other Ambulatory Visit: Payer: Self-pay

## 2019-06-04 DIAGNOSIS — Z800 Family history of malignant neoplasm of digestive organs: Secondary | ICD-10-CM

## 2019-06-04 DIAGNOSIS — Z515 Encounter for palliative care: Secondary | ICD-10-CM | POA: Diagnosis not present

## 2019-06-04 DIAGNOSIS — Z955 Presence of coronary angioplasty implant and graft: Secondary | ICD-10-CM

## 2019-06-04 DIAGNOSIS — B9620 Unspecified Escherichia coli [E. coli] as the cause of diseases classified elsewhere: Secondary | ICD-10-CM | POA: Diagnosis present

## 2019-06-04 DIAGNOSIS — N390 Urinary tract infection, site not specified: Secondary | ICD-10-CM | POA: Diagnosis present

## 2019-06-04 DIAGNOSIS — Z6821 Body mass index (BMI) 21.0-21.9, adult: Secondary | ICD-10-CM

## 2019-06-04 DIAGNOSIS — Z66 Do not resuscitate: Secondary | ICD-10-CM | POA: Diagnosis present

## 2019-06-04 DIAGNOSIS — K5732 Diverticulitis of large intestine without perforation or abscess without bleeding: Secondary | ICD-10-CM | POA: Diagnosis present

## 2019-06-04 DIAGNOSIS — K851 Biliary acute pancreatitis without necrosis or infection: Principal | ICD-10-CM | POA: Diagnosis present

## 2019-06-04 DIAGNOSIS — K810 Acute cholecystitis: Secondary | ICD-10-CM | POA: Diagnosis present

## 2019-06-04 DIAGNOSIS — J9601 Acute respiratory failure with hypoxia: Secondary | ICD-10-CM | POA: Diagnosis present

## 2019-06-04 DIAGNOSIS — F0390 Unspecified dementia without behavioral disturbance: Secondary | ICD-10-CM | POA: Diagnosis present

## 2019-06-04 DIAGNOSIS — C9110 Chronic lymphocytic leukemia of B-cell type not having achieved remission: Secondary | ICD-10-CM | POA: Diagnosis present

## 2019-06-04 DIAGNOSIS — R1011 Right upper quadrant pain: Secondary | ICD-10-CM

## 2019-06-04 DIAGNOSIS — E785 Hyperlipidemia, unspecified: Secondary | ICD-10-CM | POA: Diagnosis present

## 2019-06-04 DIAGNOSIS — R9431 Abnormal electrocardiogram [ECG] [EKG]: Secondary | ICD-10-CM

## 2019-06-04 DIAGNOSIS — E43 Unspecified severe protein-calorie malnutrition: Secondary | ICD-10-CM | POA: Insufficient documentation

## 2019-06-04 DIAGNOSIS — Z20822 Contact with and (suspected) exposure to covid-19: Secondary | ICD-10-CM | POA: Diagnosis present

## 2019-06-04 DIAGNOSIS — R112 Nausea with vomiting, unspecified: Secondary | ICD-10-CM

## 2019-06-04 DIAGNOSIS — H9190 Unspecified hearing loss, unspecified ear: Secondary | ICD-10-CM | POA: Diagnosis present

## 2019-06-04 DIAGNOSIS — Z87891 Personal history of nicotine dependence: Secondary | ICD-10-CM

## 2019-06-04 DIAGNOSIS — K5792 Diverticulitis of intestine, part unspecified, without perforation or abscess without bleeding: Secondary | ICD-10-CM | POA: Diagnosis present

## 2019-06-04 DIAGNOSIS — K8000 Calculus of gallbladder with acute cholecystitis without obstruction: Secondary | ICD-10-CM | POA: Diagnosis present

## 2019-06-04 DIAGNOSIS — I10 Essential (primary) hypertension: Secondary | ICD-10-CM | POA: Diagnosis present

## 2019-06-04 DIAGNOSIS — R748 Abnormal levels of other serum enzymes: Secondary | ICD-10-CM

## 2019-06-04 DIAGNOSIS — K819 Cholecystitis, unspecified: Secondary | ICD-10-CM

## 2019-06-04 DIAGNOSIS — I2510 Atherosclerotic heart disease of native coronary artery without angina pectoris: Secondary | ICD-10-CM | POA: Diagnosis present

## 2019-06-04 LAB — CBC WITH DIFFERENTIAL/PLATELET
Abs Immature Granulocytes: 0 10*3/uL (ref 0.00–0.07)
Basophils Absolute: 0 10*3/uL (ref 0.0–0.1)
Basophils Relative: 0 %
Eosinophils Absolute: 0.4 10*3/uL (ref 0.0–0.5)
Eosinophils Relative: 1 %
HCT: 41.1 % (ref 36.0–46.0)
Hemoglobin: 12.5 g/dL (ref 12.0–15.0)
Lymphocytes Relative: 86 %
Lymphs Abs: 32.7 10*3/uL — ABNORMAL HIGH (ref 0.7–4.0)
MCH: 29.5 pg (ref 26.0–34.0)
MCHC: 30.4 g/dL (ref 30.0–36.0)
MCV: 96.9 fL (ref 80.0–100.0)
Monocytes Absolute: 1.1 10*3/uL — ABNORMAL HIGH (ref 0.1–1.0)
Monocytes Relative: 3 %
Neutro Abs: 3.8 10*3/uL (ref 1.7–7.7)
Neutrophils Relative %: 10 %
Platelets: 173 10*3/uL (ref 150–400)
RBC: 4.24 MIL/uL (ref 3.87–5.11)
RDW: 17.4 % — ABNORMAL HIGH (ref 11.5–15.5)
WBC: 38 10*3/uL — ABNORMAL HIGH (ref 4.0–10.5)
nRBC: 0 % (ref 0.0–0.2)

## 2019-06-04 LAB — COMPREHENSIVE METABOLIC PANEL
ALT: 26 U/L (ref 0–44)
AST: 80 U/L — ABNORMAL HIGH (ref 15–41)
Albumin: 3.5 g/dL (ref 3.5–5.0)
Alkaline Phosphatase: 172 U/L — ABNORMAL HIGH (ref 38–126)
Anion gap: 8 (ref 5–15)
BUN: 27 mg/dL — ABNORMAL HIGH (ref 8–23)
CO2: 24 mmol/L (ref 22–32)
Calcium: 8.6 mg/dL — ABNORMAL LOW (ref 8.9–10.3)
Chloride: 107 mmol/L (ref 98–111)
Creatinine, Ser: 1.09 mg/dL — ABNORMAL HIGH (ref 0.44–1.00)
GFR calc Af Amer: 52 mL/min — ABNORMAL LOW (ref >60)
GFR calc non Af Amer: 45 mL/min — ABNORMAL LOW (ref >60)
Glucose, Bld: 145 mg/dL — ABNORMAL HIGH (ref 70–99)
Potassium: 4.4 mmol/L (ref 3.5–5.1)
Sodium: 139 mmol/L (ref 135–145)
Total Bilirubin: 1.5 mg/dL — ABNORMAL HIGH (ref 0.3–1.2)
Total Protein: 5.9 g/dL — ABNORMAL LOW (ref 6.5–8.1)

## 2019-06-04 LAB — LIPASE, BLOOD: Lipase: 747 U/L — ABNORMAL HIGH (ref 11–51)

## 2019-06-04 LAB — TROPONIN I (HIGH SENSITIVITY): Troponin I (High Sensitivity): 7 ng/L (ref <18)

## 2019-06-04 MED ORDER — FENTANYL CITRATE (PF) 100 MCG/2ML IJ SOLN
25.0000 ug | Freq: Once | INTRAMUSCULAR | Status: AC
  Administered 2019-06-04: 22:00:00 25 ug via INTRAVENOUS
  Filled 2019-06-04: qty 2

## 2019-06-04 NOTE — ED Notes (Signed)
ED Provider at bedside. 

## 2019-06-04 NOTE — ED Notes (Signed)
XR at bedside

## 2019-06-04 NOTE — ED Triage Notes (Signed)
Per EMS, patient from [facility], reports patient got nighttime medications at approximately 1900 and started c/o abdominal pain at 2000. Hx dementia.

## 2019-06-04 NOTE — ED Provider Notes (Signed)
Emergency Department Provider Note   I have reviewed the triage vital signs and the nursing notes.   HISTORY  Chief Complaint Abdominal Pain   HPI Carla Conway is a 86 y.o. female with PMH of CAD, HLD, HTN, and Dementia presents to the ED by EMS from [facility] home.  According to EMS the staff gave her nighttime medications at 7 PM and shortly afterwards she began complaining of abdominal pain.  No vomiting reported by facility but patient did have 1 episode of vomiting shortly after arrival in the emergency department.  Patient tells me that she is having pain in her upper abdomen but because of dementia has difficulty describing this further.  Level 5 caveat applies.  She denies chest pain or shortness of breath.   Past Medical History:  Diagnosis Date  . Asthma   . CAD (coronary artery disease)   . Dementia without behavioral disturbance ([place]) 06/05/2019  . Falls frequently   . Hyperlipidemia   . Hypertension     Patient Active Problem List   Diagnosis Date Noted  . Acute cholecystitis 06/05/2019  . Dementia without behavioral disturbance ([place]) 06/05/2019  . Diverticulitis 06/05/2019  . Preventive measure 12/29/2017  . Other fatigue 09/25/2017  . Malnutrition of moderate degree 07/20/2017  . Enterocolitis 07/18/2017  . Pancytopenia, acquired ([place]) 07/14/2017  . Malignant cachexia ([place]) 06/04/2017  . Hypokalemia 04/29/2017  . Left arm pain 04/29/2017  . TIA (transient ischemic attack) 04/29/2017  . Abdominal pain 04/28/2017  . Elevated liver enzymes 04/01/2017  . AKI (acute kidney injury) ([place]) 03/31/2017  . Dehydration 03/31/2017  . Generalized weakness 03/31/2017  . External hemorrhoids without complication 81/82/9937  . Port-A-Cath in place 01/06/2017  . Gastritis 01/06/2017  . Poor memory 12/11/2016  . Dysuria 11/13/2016  . Anaphylaxis 10/29/2016  . Anaphylactoid reaction 10/29/2016  . Goals of care, counseling/discussion 10/28/2016  . Chronic  kidney disease, stage III (moderate) ([place]) 10/17/2016  . 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 Neutropenia ([place]) 09/23/2016  . CLL (chronic lymphocytic leukemia) ([place]) 05/11/2016  . Acute on chronic diastolic heart failure ([place]) 05/10/2016  . Influenza A 05/09/2016  . Lymphadenopathy of head and neck region 05/09/2016  . Lymphadenopathy, thoracic 05/09/2016  . COPD (chronic obstructive pulmonary disease) ([place]) 05/09/2016  . Debility 05/09/2016  . Syncopal episodes 05/09/2016  . Syncope 05/08/2016  . HTN (hypertension) 05/18/2013  . CAD in native artery 05/18/2013    Past Surgical History:  Procedure Laterality Date  . CATARACT EXTRACTION    . CORONARY ANGIOPLASTY  10 -15 years ago   stent in [place] ,[place]  . IR FLUORO GUIDE PORT INSERTION RIGHT  10/23/2016  . IR REMOVAL TUN ACCESS W/ PORT W/O FL MOD SED  02/05/2018  . IR US GUIDE VASC ACCESS RIGHT  10/23/2016    Allergies Rituximab  Family History  Problem Relation Age of Onset  . Cancer Father        colon    Social History Social History   Tobacco Use  . Smoking status: Former Smoker    Types: Cigarettes    Start date: 05/18/1983  . Smokeless tobacco: Never Used  Substance Use Topics  . Alcohol use: No  . Drug use: No    Review of Systems  Level 5 caveat: Dementia   ____________________________________________   PHYSICAL EXAM:  VITAL SIGNS: ED Triage Vitals [06/04/19 2121]  Enc Vitals Group     BP (!) 142/58     Pulse Rate 62  Resp 20     Temp 97.7 F (36.5 C)     Temp Source Oral     SpO2 95 %   Constitutional: Alert to self only (baseline). Appears somewhat uncomfortable with moaning and laying on her right side.  Eyes: Conjunctivae are normal.  Head: Atraumatic. Nose: No congestion/rhinnorhea. Mouth/Throat: Mucous membranes are moist.  Neck: No stridor.  Cardiovascular: Normal rate, regular rhythm. Good peripheral circulation. Grossly normal heart sounds.   Respiratory: Normal  respiratory effort.  No retractions. Lungs CTAB. Gastrointestinal: Soft with epigastric tenderness on exam without rebound or guarding. More mild tenderness in the lower abdomen with no focal peritonitis. No distention.  Musculoskeletal: No gross deformities of extremities. Neurologic:  Normal speech and language.  Skin:  Skin is warm, dry and intact. No rash noted.   ____________________________________________   LABS (all labs ordered are listed, but only abnormal results are displayed)  Labs Reviewed  COMPREHENSIVE METABOLIC PANEL - Abnormal; Notable for the following components:      Result Value   Glucose, Bld 145 (*)    BUN 27 (*)    Creatinine, Ser 1.09 (*)    Calcium 8.6 (*)    Total Protein 5.9 (*)    AST 80 (*)    Alkaline Phosphatase 172 (*)    Total Bilirubin 1.5 (*)    GFR calc non Af Amer 45 (*)    GFR calc Af Amer 52 (*)    All other components within normal limits  LIPASE, BLOOD - Abnormal; Notable for the following components:   Lipase 747 (*)    All other components within normal limits  CBC WITH DIFFERENTIAL/PLATELET - Abnormal; Notable for the following components:   WBC 38.0 (*)    RDW 17.4 (*)    Lymphs Abs 32.7 (*)    Monocytes Absolute 1.1 (*)    All other components within normal limits  URINALYSIS, ROUTINE W REFLEX MICROSCOPIC - Abnormal; Notable for the following components:   APPearance TURBID (*)    Specific Gravity, Urine 1.035 (*)    Hgb urine dipstick SMALL (*)    Bilirubin Urine SMALL (*)    Ketones, ur 5 (*)    Protein, ur 100 (*)    Bacteria, UA MANY (*)    All other components within normal limits  CBC - Abnormal; Notable for the following components:   WBC 35.9 (*)    RDW 17.3 (*)    All other components within normal limits  COMPREHENSIVE METABOLIC PANEL - Abnormal; Notable for the following components:   Glucose, Bld 117 (*)    BUN 27 (*)    Creatinine, Ser 1.07 (*)    Calcium 8.7 (*)    Total Protein 6.1 (*)    AST 51 (*)      Alkaline Phosphatase 169 (*)    GFR calc non Af Amer 46 (*)    GFR calc Af Amer 53 (*)    All other components within normal limits  LIPASE, BLOOD - Abnormal; Notable for the following components:   Lipase 194 (*)    All other components within normal limits  URINE CULTURE  RESPIRATORY PANEL BY RT PCR (FLU A&B, COVID)  MAGNESIUM  TROPONIN I (HIGH SENSITIVITY)  TROPONIN I (HIGH SENSITIVITY)   ____________________________________________  EKG   EKG Interpretation  Date/Time:  Saturday June 04 2019 21:35:54 EST Ventricular Rate:  59 PR Interval:    QRS Duration: 103 QT Interval:  566 QTC Calculation: 561 R Axis:  62 Text Interpretation: Sinus rhythm RSR' in V1 or V2, right VCD or RVH Abnormal T, consider ischemia, anterior leads Prolonged QT interval No STEMI Confirmed by Nanda Quinton [id number]) on 06/04/2019 9:41:40 PM       ____________________________________________  RADIOLOGY  CT ABDOMEN PELVIS W CONTRAST  Result Date: 06/05/2019 CLINICAL DATA:  Abdominal distension, history of chronic lymphocytic leukemia EXAM: CT ABDOMEN AND PELVIS WITH CONTRAST TECHNIQUE: Multidetector CT imaging of the abdomen and pelvis was performed using the standard protocol following bolus administration of intravenous contrast. CONTRAST:  61m OMNIPAQUE IOHEXOL 300 MG/ML  SOLN COMPARISON:  July 18, 2017 FINDINGS: Lower chest: The visualized heart size within normal limits. Coronary artery and aortic valve calcifications are noted. No pericardial fluid/thickening. No hiatal hernia. The visualized portions of the lungs are clear. Hepatobiliary: Multiple low-density lesions are again noted throughout the liver parenchyma the largest within the left liver lobe measuring 2.8 cm and unchanged from the prior exam. Main portal vein is patent. There appears to be hyperenhancement of the gallbladder wall with mild intrahepatic to biliary ductal dilatation. There is mild surrounding gallbladder wall  thickening/trace pericholecystic fluid. There is prominence of the common bile duct measuring up to 9 mm which tapers at the level of the ampulla. No shadowing stones are noted. Pancreas: Unremarkable. No pancreatic ductal dilatation or surrounding inflammatory changes. Spleen: Scattered low-density lesions are seen throughout the posterior spleen which appear to have increased in size from the prior exam. The largest measuring 2.6 cm, series 2, image 30 and was previously 1.1 cm. Adrenals/Urinary Tract: Both adrenal glands appear normal. There is a hyperdense mass within the upper pole of the right kidney which is increased in size now measuring 2.3 cm in greatest dimension and previously 1.4 cm. No hydronephrosis seen. The bladder is grossly unremarkable. Stomach/Bowel: The stomach and small bowel are normal in appearance. The appendix is unremarkable. Extensive sigmoid colonic diverticulosis is noted. Surrounding mesenteric fat stranding changes are seen around a 9 cm segment of sigmoid colon. No pericolonic free air or loculated fluid collection is seen. Vascular/Lymphatic: Extensive retroperitoneal and mesenteric adenopathy is again identified. They appear to have increased in size and number since the prior exam. For reference within the aortocaval region on series 2, image 50 there is a lymph node now measuring 1.4 cm, and previously 1.1 cm in greatest dimension. There also numerous scattered pericolonic and deep pelvic lymph nodes now noted. Reproductive: The uterus and adnexa are unremarkable. Other: No evidence of abdominal wall mass or hernia. Musculoskeletal: There is interval slight worsening in the chronic superior compression deformity of the T12 vertebral body with approximately 50% loss in vertebral body height, when compared to prior radiograph of March 25, 2019. IMPRESSION: 1. Gallbladder wall thickening/hyperenhancement with a small amount of pericholecystic fluid with mild intrahepatic and  common bile duct dilatation. This could be due to cholecystitis and if further evaluation is required would recommend right upper quadrant ultrasound. 2. Interval enlargement of splenic lesions and extensive mesenteric / retroperitoneal lymph nodes. These findings are suggestive of interval progression of chronic lymphocytic leukemia. 3. Interval enlargement of hyperdense partially exophytic right upper pole renal lesion now measuring 2.3 cm, concerning for metastatic disease. 4. Findings of acute sigmoid colonic diverticulitis. No pericolonic fluid collections or free air. Electronically Signed   By: BPrudencio PairM.D.   On: 06/05/2019 00:47   DG Chest Portable 1 View  Result Date: 06/04/2019 CLINICAL DATA:  Chest pain. EXAM: PORTABLE CHEST 1 VIEW COMPARISON:  February 27, 2019 FINDINGS: The heart size is stable from prior study. Aortic calcifications are noted. There is elevation of the right hemidiaphragm with right basilar atelectasis versus scarring which is similar to prior study. There is no pneumothorax. No focal infiltrate. No significant pleural effusion. Degenerative changes are again noted of the right glenohumeral joint. IMPRESSION: No active disease. Electronically Signed   By: Constance Holster M.D.   On: 06/04/2019 22:07   US Abdomen Limited RUQ  Result Date: 06/05/2019 CLINICAL DATA:  Right upper quadrant pain EXAM: ULTRASOUND ABDOMEN LIMITED RIGHT UPPER QUADRANT COMPARISON:  None. FINDINGS: Gallbladder: There is layering gallbladder sludge and stones seen throughout. The largest measuring 7 mm. There is diffuse gallbladder wall thickening measuring up to 8.8 mm. Small amount of pericholecystic fluid is seen. There is a sonographic Murphy sign present. Common bile duct: Diameter: 10.2 mm Liver: Numerous anechoic cysts are seen throughout the liver the largest measuring 3.2 cm in the left liver lobe. Within normal limits in parenchymal echogenicity. Portal vein is patent on color Doppler  imaging with normal direction of blood flow towards the liver. Other: None. IMPRESSION: Findings suggestive of acute cholecystitis as described above. Mild common bile duct dilatation. Multiple anechoic hepatic cyst. Electronically Signed   By: Prudencio Pair M.D.   On: 06/05/2019 02:56    ____________________________________________   PROCEDURES  Procedure(s) performed:   Procedures  None ____________________________________________   INITIAL IMPRESSION / ASSESSMENT AND PLAN / ED COURSE  Pertinent labs & imaging results that were available during my care of the patient were reviewed by me and considered in my medical decision making (see chart for details).   Patient presents to the emergency department for evaluation of abdominal pain and vomiting since arrival in the ED. Appears mildly uncomfortable but no acute distress.  The patient's underlying dementia does limit the history. EKG with no ischemia but does have prolonged QTc. Will add on Mg. Plan for CT abdomen/pelvis and reassess.   Care transferred to Dr. Florina Ou pending CT.  ____________________________________________  FINAL CLINICAL IMPRESSION(S) / ED DIAGNOSES  Final diagnoses:  Non-intractable vomiting with nausea, unspecified vomiting type  Prolonged Q-T interval on ECG  RUQ abdominal pain  Cholecystitis  Acute biliary pancreatitis without infection or necrosis  Diverticulitis     MEDICATIONS GIVEN DURING THIS VISIT:  Medications  sodium chloride (PF) 0.9 % injection (has no administration in time range)  heparin injection 5,000 Units (5,000 Units Subcutaneous Not Given 06/05/19 0543)  0.9 %  sodium chloride infusion ( Intravenous Rate/Dose Change 06/05/19 1119)  acetaminophen (TYLENOL) tablet 650 mg (650 mg Oral Given 06/05/19 1131)    Or  acetaminophen (TYLENOL) suppository 650 mg ( Rectal See Alternative 06/05/19 1131)  morphine 2 MG/ML injection 1 mg (has no administration in time range)  metroNIDAZOLE  (FLAGYL) IVPB 500 mg (500 mg Intravenous New Bag/Given 06/05/19 0956)  ceFEPIme (MAXIPIME) 2 g in sodium chloride 0.9 % 100 mL IVPB (2 g Intravenous New Bag/Given 06/05/19 1118)  aspirin EC tablet 81 mg (81 mg Oral Given 06/05/19 1130)  metoprolol tartrate (LOPRESSOR) tablet 25 mg (25 mg Oral Given 06/05/19 1130)  montelukast (SINGULAIR) tablet 10 mg (has no administration in time range)  OLANZapine (ZYPREXA) tablet 2.5 mg (has no administration in time range)  pantoprazole (PROTONIX) EC tablet 40 mg (40 mg Oral Given 06/05/19 1130)  sertraline (ZOLOFT) tablet 100 mg (100 mg Oral Given 06/05/19 1130)  amLODipine (NORVASC) tablet 10 mg (10 mg Oral Given 06/05/19 1130)  fentaNYL (  SUBLIMAZE) injection 25 mcg (25 mcg Intravenous Given 06/04/19 2152)  iohexol (OMNIPAQUE) 300 MG/ML solution 75 mL (75 mLs Intravenous Contrast Given 06/05/19 0019)  ceFEPIme (MAXIPIME) 2 g in sodium chloride 0.9 % 100 mL IVPB (0 g Intravenous Stopped 06/05/19 0243)    And  metroNIDAZOLE (FLAGYL) IVPB 500 mg (0 mg Intravenous Stopped 06/05/19 0340)  fentaNYL (SUBLIMAZE) injection 50 mcg (50 mcg Intravenous Given 06/05/19 0356)  0.9 %  sodium chloride infusion ( Intravenous New Bag/Given 06/05/19 0358)     Note:  This document was prepared using Dragon voice recognition software and may include unintentional dictation errors.  Nanda Quinton, MD, [facility]    Quinto Tippy, [facility], MD 06/05/19 1344

## 2019-06-05 ENCOUNTER — Emergency Department (HOSPITAL_COMMUNITY): Payer: Medicare Other

## 2019-06-05 ENCOUNTER — Encounter (HOSPITAL_COMMUNITY): Payer: Self-pay

## 2019-06-05 DIAGNOSIS — E43 Unspecified severe protein-calorie malnutrition: Secondary | ICD-10-CM | POA: Diagnosis present

## 2019-06-05 DIAGNOSIS — K810 Acute cholecystitis: Secondary | ICD-10-CM | POA: Diagnosis present

## 2019-06-05 DIAGNOSIS — I10 Essential (primary) hypertension: Secondary | ICD-10-CM | POA: Diagnosis present

## 2019-06-05 DIAGNOSIS — H9190 Unspecified hearing loss, unspecified ear: Secondary | ICD-10-CM | POA: Diagnosis present

## 2019-06-05 DIAGNOSIS — R1084 Generalized abdominal pain: Secondary | ICD-10-CM | POA: Diagnosis not present

## 2019-06-05 DIAGNOSIS — K851 Biliary acute pancreatitis without necrosis or infection: Secondary | ICD-10-CM | POA: Diagnosis present

## 2019-06-05 DIAGNOSIS — Z87891 Personal history of nicotine dependence: Secondary | ICD-10-CM | POA: Diagnosis not present

## 2019-06-05 DIAGNOSIS — B9620 Unspecified Escherichia coli [E. coli] as the cause of diseases classified elsewhere: Secondary | ICD-10-CM | POA: Diagnosis present

## 2019-06-05 DIAGNOSIS — Z800 Family history of malignant neoplasm of digestive organs: Secondary | ICD-10-CM | POA: Diagnosis not present

## 2019-06-05 DIAGNOSIS — I2510 Atherosclerotic heart disease of native coronary artery without angina pectoris: Secondary | ICD-10-CM | POA: Diagnosis present

## 2019-06-05 DIAGNOSIS — K5792 Diverticulitis of intestine, part unspecified, without perforation or abscess without bleeding: Secondary | ICD-10-CM | POA: Diagnosis present

## 2019-06-05 DIAGNOSIS — Z955 Presence of coronary angioplasty implant and graft: Secondary | ICD-10-CM | POA: Diagnosis not present

## 2019-06-05 DIAGNOSIS — Z7189 Other specified counseling: Secondary | ICD-10-CM | POA: Diagnosis not present

## 2019-06-05 DIAGNOSIS — Z6821 Body mass index (BMI) 21.0-21.9, adult: Secondary | ICD-10-CM | POA: Diagnosis not present

## 2019-06-05 DIAGNOSIS — Z515 Encounter for palliative care: Secondary | ICD-10-CM | POA: Diagnosis not present

## 2019-06-05 DIAGNOSIS — Z20822 Contact with and (suspected) exposure to covid-19: Secondary | ICD-10-CM | POA: Diagnosis present

## 2019-06-05 DIAGNOSIS — F0390 Unspecified dementia without behavioral disturbance: Secondary | ICD-10-CM | POA: Diagnosis present

## 2019-06-05 DIAGNOSIS — K5732 Diverticulitis of large intestine without perforation or abscess without bleeding: Secondary | ICD-10-CM | POA: Diagnosis present

## 2019-06-05 DIAGNOSIS — K819 Cholecystitis, unspecified: Secondary | ICD-10-CM

## 2019-06-05 DIAGNOSIS — J9601 Acute respiratory failure with hypoxia: Secondary | ICD-10-CM | POA: Diagnosis present

## 2019-06-05 DIAGNOSIS — C9110 Chronic lymphocytic leukemia of B-cell type not having achieved remission: Secondary | ICD-10-CM | POA: Diagnosis present

## 2019-06-05 DIAGNOSIS — N390 Urinary tract infection, site not specified: Secondary | ICD-10-CM | POA: Diagnosis present

## 2019-06-05 DIAGNOSIS — Z66 Do not resuscitate: Secondary | ICD-10-CM | POA: Diagnosis present

## 2019-06-05 DIAGNOSIS — E785 Hyperlipidemia, unspecified: Secondary | ICD-10-CM | POA: Diagnosis present

## 2019-06-05 DIAGNOSIS — R52 Pain, unspecified: Secondary | ICD-10-CM | POA: Diagnosis not present

## 2019-06-05 DIAGNOSIS — R748 Abnormal levels of other serum enzymes: Secondary | ICD-10-CM | POA: Diagnosis present

## 2019-06-05 DIAGNOSIS — K8000 Calculus of gallbladder with acute cholecystitis without obstruction: Secondary | ICD-10-CM | POA: Diagnosis present

## 2019-06-05 HISTORY — DX: Unspecified dementia, unspecified severity, without behavioral disturbance, psychotic disturbance, mood disturbance, and anxiety: F03.90

## 2019-06-05 LAB — CBC
HCT: 40 % (ref 36.0–46.0)
Hemoglobin: 12.4 g/dL (ref 12.0–15.0)
MCH: 29.6 pg (ref 26.0–34.0)
MCHC: 31 g/dL (ref 30.0–36.0)
MCV: 95.5 fL (ref 80.0–100.0)
Platelets: 160 10*3/uL (ref 150–400)
RBC: 4.19 MIL/uL (ref 3.87–5.11)
RDW: 17.3 % — ABNORMAL HIGH (ref 11.5–15.5)
WBC: 35.9 10*3/uL — ABNORMAL HIGH (ref 4.0–10.5)
nRBC: 0 % (ref 0.0–0.2)

## 2019-06-05 LAB — LIPASE, BLOOD: Lipase: 194 U/L — ABNORMAL HIGH (ref 11–51)

## 2019-06-05 LAB — COMPREHENSIVE METABOLIC PANEL
ALT: 27 U/L (ref 0–44)
AST: 51 U/L — ABNORMAL HIGH (ref 15–41)
Albumin: 3.6 g/dL (ref 3.5–5.0)
Alkaline Phosphatase: 169 U/L — ABNORMAL HIGH (ref 38–126)
Anion gap: 10 (ref 5–15)
BUN: 27 mg/dL — ABNORMAL HIGH (ref 8–23)
CO2: 24 mmol/L (ref 22–32)
Calcium: 8.7 mg/dL — ABNORMAL LOW (ref 8.9–10.3)
Chloride: 107 mmol/L (ref 98–111)
Creatinine, Ser: 1.07 mg/dL — ABNORMAL HIGH (ref 0.44–1.00)
GFR calc Af Amer: 53 mL/min — ABNORMAL LOW (ref >60)
GFR calc non Af Amer: 46 mL/min — ABNORMAL LOW (ref >60)
Glucose, Bld: 117 mg/dL — ABNORMAL HIGH (ref 70–99)
Potassium: 4.4 mmol/L (ref 3.5–5.1)
Sodium: 141 mmol/L (ref 135–145)
Total Bilirubin: 0.7 mg/dL (ref 0.3–1.2)
Total Protein: 6.1 g/dL — ABNORMAL LOW (ref 6.5–8.1)

## 2019-06-05 LAB — URINALYSIS, ROUTINE W REFLEX MICROSCOPIC
Glucose, UA: NEGATIVE mg/dL
Ketones, ur: 5 mg/dL — AB
Leukocytes,Ua: NEGATIVE
Nitrite: NEGATIVE
Protein, ur: 100 mg/dL — AB
Specific Gravity, Urine: 1.035 — ABNORMAL HIGH (ref 1.005–1.030)
pH: 6 (ref 5.0–8.0)

## 2019-06-05 LAB — RESPIRATORY PANEL BY RT PCR (FLU A&B, COVID)
Influenza A by PCR: NEGATIVE
Influenza B by PCR: NEGATIVE
SARS Coronavirus 2 by RT PCR: NEGATIVE

## 2019-06-05 LAB — TROPONIN I (HIGH SENSITIVITY): Troponin I (High Sensitivity): 8 ng/L (ref <18)

## 2019-06-05 LAB — MAGNESIUM: Magnesium: 2.1 mg/dL (ref 1.7–2.4)

## 2019-06-05 MED ORDER — METRONIDAZOLE IN NACL 5-0.79 MG/ML-% IV SOLN
500.0000 mg | Freq: Three times a day (TID) | INTRAVENOUS | Status: DC
  Administered 2019-06-05 – 2019-06-10 (×16): 500 mg via INTRAVENOUS
  Filled 2019-06-05 (×15): qty 100

## 2019-06-05 MED ORDER — ONDANSETRON HCL 4 MG/2ML IJ SOLN
4.0000 mg | Freq: Once | INTRAMUSCULAR | Status: AC
  Administered 2019-06-05: 21:00:00 4 mg via INTRAVENOUS
  Filled 2019-06-05: qty 2

## 2019-06-05 MED ORDER — ACETAMINOPHEN 650 MG RE SUPP: 650.0000 mg | Freq: Four times a day (QID) | RECTAL | Status: DC | PRN

## 2019-06-05 MED ORDER — ACETAMINOPHEN 325 MG PO TABS
650.0000 mg | ORAL_TABLET | Freq: Four times a day (QID) | ORAL | Status: DC | PRN
  Administered 2019-06-05 – 2019-06-07 (×3): 650 mg via ORAL
  Filled 2019-06-05 (×3): qty 2

## 2019-06-05 MED ORDER — SODIUM CHLORIDE 0.9 % IV SOLN: Freq: Once | INTRAVENOUS | Status: AC

## 2019-06-05 MED ORDER — SODIUM CHLORIDE 0.9 % IV SOLN
2.0000 g | Freq: Two times a day (BID) | INTRAVENOUS | Status: DC
  Administered 2019-06-05 – 2019-06-08 (×7): 2 g via INTRAVENOUS
  Filled 2019-06-05 (×8): qty 2

## 2019-06-05 MED ORDER — FENTANYL CITRATE (PF) 100 MCG/2ML IJ SOLN
50.0000 ug | Freq: Once | INTRAMUSCULAR | Status: AC
  Administered 2019-06-05: 04:00:00 50 ug via INTRAVENOUS
  Filled 2019-06-05: qty 2

## 2019-06-05 MED ORDER — IOHEXOL 300 MG/ML  SOLN
75.0000 mL | Freq: Once | INTRAMUSCULAR | Status: AC | PRN
  Administered 2019-06-05: 75 mL via INTRAVENOUS

## 2019-06-05 MED ORDER — SODIUM CHLORIDE (PF) 0.9 % IJ SOLN
INTRAMUSCULAR | Status: AC
  Filled 2019-06-05: qty 50

## 2019-06-05 MED ORDER — OLANZAPINE 5 MG PO TABS
2.5000 mg | ORAL_TABLET | Freq: Every day | ORAL | Status: DC
  Administered 2019-06-05 – 2019-06-16 (×12): 2.5 mg via ORAL
  Filled 2019-06-05 (×12): qty 1

## 2019-06-05 MED ORDER — PREDNISONE 5 MG PO TABS: 5.0000 mg | ORAL_TABLET | Freq: Every day | ORAL | Status: DC

## 2019-06-05 MED ORDER — MONTELUKAST SODIUM 10 MG PO TABS
10.0000 mg | ORAL_TABLET | Freq: Every day | ORAL | Status: DC
  Administered 2019-06-05 – 2019-06-16 (×12): 10 mg via ORAL
  Filled 2019-06-05 (×12): qty 1

## 2019-06-05 MED ORDER — METOPROLOL TARTRATE 25 MG PO TABS
25.0000 mg | ORAL_TABLET | Freq: Two times a day (BID) | ORAL | Status: DC
  Administered 2019-06-05 – 2019-06-17 (×25): 25 mg via ORAL
  Filled 2019-06-05 (×25): qty 1

## 2019-06-05 MED ORDER — SERTRALINE HCL 100 MG PO TABS
100.0000 mg | ORAL_TABLET | Freq: Every day | ORAL | Status: DC
  Administered 2019-06-05 – 2019-06-17 (×13): 100 mg via ORAL
  Filled 2019-06-05 (×13): qty 1

## 2019-06-05 MED ORDER — MORPHINE SULFATE (PF) 2 MG/ML IV SOLN
1.0000 mg | INTRAVENOUS | Status: DC | PRN
  Administered 2019-06-05: 21:00:00 1 mg via INTRAVENOUS
  Filled 2019-06-05: qty 1

## 2019-06-05 MED ORDER — SODIUM CHLORIDE 0.9 % IV SOLN: INTRAVENOUS | Status: DC

## 2019-06-05 MED ORDER — AMLODIPINE BESYLATE 10 MG PO TABS
10.0000 mg | ORAL_TABLET | Freq: Every day | ORAL | Status: DC
  Administered 2019-06-05 – 2019-06-17 (×13): 10 mg via ORAL
  Filled 2019-06-05 (×13): qty 1

## 2019-06-05 MED ORDER — SODIUM CHLORIDE 0.9 % IV SOLN
2.0000 g | Freq: Once | INTRAVENOUS | Status: AC
  Administered 2019-06-05: 02:00:00 2 g via INTRAVENOUS
  Filled 2019-06-05: qty 2

## 2019-06-05 MED ORDER — PANTOPRAZOLE SODIUM 40 MG PO TBEC
40.0000 mg | DELAYED_RELEASE_TABLET | Freq: Every day | ORAL | Status: DC
  Administered 2019-06-05 – 2019-06-17 (×13): 40 mg via ORAL
  Filled 2019-06-05 (×13): qty 1

## 2019-06-05 MED ORDER — ASPIRIN EC 81 MG PO TBEC
81.0000 mg | DELAYED_RELEASE_TABLET | Freq: Every day | ORAL | Status: DC
  Administered 2019-06-05 – 2019-06-17 (×13): 81 mg via ORAL
  Filled 2019-06-05 (×13): qty 1

## 2019-06-05 MED ORDER — HEPARIN SODIUM (PORCINE) 5000 UNIT/ML IJ SOLN
5000.0000 [IU] | Freq: Three times a day (TID) | INTRAMUSCULAR | Status: DC
  Administered 2019-06-05 – 2019-06-17 (×24): 5000 [IU] via SUBCUTANEOUS
  Filled 2019-06-05 (×27): qty 1

## 2019-06-05 MED ORDER — METRONIDAZOLE IN NACL 5-0.79 MG/ML-% IV SOLN
500.0000 mg | Freq: Once | INTRAVENOUS | Status: AC
  Administered 2019-06-05: 03:00:00 500 mg via INTRAVENOUS
  Filled 2019-06-05: qty 100

## 2019-06-05 NOTE — ED Notes (Signed)
US at bedside

## 2019-06-05 NOTE — ED Notes (Signed)
Bladder scanned twice and results showed 11m both times.

## 2019-06-05 NOTE — H&P (Signed)
History and Physical    Carla Conway FIE:[id number] DOB: 11-07-1926 DOA: 06/04/2019  PCP: Lujean Amel, MD (Confirm with patient/family/NH records and if not entered, this has to be entered at [facility] point of entry)  Patient coming from: [facility]  I have personally briefly reviewed patient's old medical records in [place]  Chief Complaint: Abdominal pain  HPI: Carla Conway is a 86 y.o. female with medical history significant of hypertension, hyperlipidemia, coronary artery disease and dementia presented to ED from skilled nursing facility for evaluation of abdominal pain.  Patient is severely demented and unable to answer any questions appropriately therefore history is gathered from ED physician.  According to the ED physician, the EMS reported that patient suddenly started complaining of abdominal pain around 7 PM after she took her nighttime medications.  EMS was called by nursing facility and patient is brought to ED for further evaluation.  No fever, chills, vomiting and diarrhea reported by the EMS.  During my evaluation patient is laying comfortably in the bed and she is in a very pleasant mood and denies all the complaints.  Most of the answers from the patient are not making any sense that is most probably her baseline secondary to severe dementia.  ED Course: On arrival to the ED patient had temperature of 97.9, blood pressure 142/58, heart rate 62, respiratory rate 20 and oxygen saturation 95% on room air.  Blood work showed severe leukocytosis with WBC count of 38, creatinine 1.09, lipase 747, alkaline phosphatase 172, AST 80 and total bilirubin 1.5.  CT abdomen was consistent with cholecystitis and acute sigmoid diverticulitis ultrasound right upper quadrant was done and it showed acute cholecystitis.  ED did not showed any acute ischemic changes but showed prolonged QTc interval.  Patient also had an episode of vomiting in the ED.  ED physician contacted general  surgery and they recommended to start the patient on cefepime and Flagyl for treatment of cholecystitis and diverticulitis and they will see the patient in the morning.  She was also given IV fluids and pain medications in the ED.  Review of Systems: Patient is pleasantly demented and unable to answer any questions appropriately.   Past Medical History:  Diagnosis Date  . Asthma   . CAD (coronary artery disease)   . Dementia without behavioral disturbance ([place]) 06/05/2019  . Falls frequently   . Hyperlipidemia   . Hypertension     Past Surgical History:  Procedure Laterality Date  . CATARACT EXTRACTION    . CORONARY ANGIOPLASTY  10 -15 years ago   stent in [place] ,[place]  . IR FLUORO GUIDE PORT INSERTION RIGHT  10/23/2016  . IR REMOVAL TUN ACCESS W/ PORT W/O FL MOD SED  02/05/2018  . IR US GUIDE VASC ACCESS RIGHT  10/23/2016     reports that she has quit smoking. Her smoking use included cigarettes. She started smoking about 36 years ago. She has never used smokeless tobacco. She reports that she does not drink alcohol or use drugs.  Allergies  Allergen Reactions  . Rituximab Other (See Comments)    "She coded"/Upset stomach and blood pressure dropped    Family History  Problem Relation Age of Onset  . Cancer Father        colon    Prior to Admission medications   Medication Sig Start Date End Date Taking? Authorizing Provider  acetaminophen (TYLENOL) 325 MG tablet Take 325 mg by mouth daily as needed for mild pain.  Yes [provider]  acyclovir (ZOVIRAX) 400 MG tablet Take 1 tablet (400 mg total) by mouth daily. 06/04/17  Yes Gorsuch, Ni, MD  amLODipine (NORVASC) 10 MG tablet Take 1 tablet (10 mg total) by mouth daily. 11/24/17  Yes Emokpae, Courage, MD  aspirin EC 81 MG EC tablet Take 1 tablet (81 mg total) by mouth daily. 05/02/17  Yes Oswald Hillock, MD  hydrALAZINE (APRESOLINE) 25 MG tablet Take 1 tablet (25 mg total) by mouth 3 (three) times daily. 11/23/17  12/23/19 Yes Emokpae, Courage, MD  lovastatin (MEVACOR) 40 MG tablet Take 40 mg by mouth at bedtime.  05/09/13  Yes [provider]  metoprolol tartrate (LOPRESSOR) 25 MG tablet Take 1 tablet (25 mg total) by mouth 2 (two) times daily. 11/23/17  Yes Emokpae, Courage, MD  montelukast (SINGULAIR) 10 MG tablet Take 10 mg by mouth at bedtime. 05/15/17  Yes [provider]  OLANZapine (ZYPREXA) 2.5 MG tablet Take 2.5 mg by mouth at bedtime.   Yes [provider]  omeprazole (PRILOSEC) 20 MG capsule Take 1 capsule (20 mg total) by mouth daily. 01/06/17  Yes Gorsuch, Ni, MD  potassium chloride (K-DUR) 10 MEQ tablet Take 10 mEq by mouth daily. 03/24/16  Yes [provider]  predniSONE (DELTASONE) 5 MG tablet Take 1 tablet (5 mg total) by mouth daily with breakfast. 03/29/18  Yes Gorsuch, Ni, MD  sertraline (ZOLOFT) 100 MG tablet Take 100 mg by mouth daily.    Yes [provider]  HYDROcodone-acetaminophen (NORCO/VICODIN) 5-325 MG tablet Take 1 tablet by mouth every 8 (eight) hours as needed for pain. 03/23/19   [provider]    Physical Exam: Vitals:   06/05/19 0302 06/05/19 0400 06/05/19 0402 06/05/19 0500  BP:  (!) 176/77  (!) 142/63  Pulse: 83 76  71  Resp: (!) 23 17  17   Temp:      TempSrc:      SpO2: 97%   95%  Weight:   61.2 kg     Constitutional: NAD, calm, comfortable Vitals:   06/05/19 0302 06/05/19 0400 06/05/19 0402 06/05/19 0500  BP:  (!) 176/77  (!) 142/63  Pulse: 83 76  71  Resp: (!) 23 17  17   Temp:      TempSrc:      SpO2: 97%   95%  Weight:   61.2 kg     General: Patient is a pleasantly demented 86 year old Caucasian female in no acute distress at this time. Eyes: PERRL, lids and conjunctivae normal ENMT: Mucous membranes are moist. Posterior pharynx clear of any exudate or lesions.Normal dentition.  Neck: normal, supple, no masses, no thyromegaly Respiratory: clear to auscultation bilaterally, no wheezing, no crackles.  Normal respiratory effort. No accessory muscle use.  Cardiovascular: Regular rate and rhythm, no murmurs / rubs / gallops. No extremity edema. 2+ pedal pulses. No carotid bruits.  Abdomen: Abdomen is mildly tender in right upper quadrant on deep palpation.  No tenderness in left lower quadrant.  Abdomen is soft and nondistended.No masses palpated. No hepatosplenomegaly. Bowel sounds positive.  Musculoskeletal: no clubbing / cyanosis. No joint deformity upper and lower extremities. Good ROM, no contractures. Normal muscle tone.  Skin: no rashes, lesions, ulcers. No induration Neurologic: Patient is awake, alert but not oriented to time person, place and situation.  CN 2-12 grossly intact. Sensation intact, DTR normal. Strength 5/5 in all 4.  Psychiatric: Patient is pleasantly demented.  Labs on Admission: I have personally reviewed following labs  and imaging studies  CBC: Recent Labs  Lab 06/04/19 2132  WBC 38.0*  NEUTROABS 3.8  HGB 12.5  HCT 41.1  MCV 96.9  PLT 646   Basic Metabolic Panel: Recent Labs  Lab 06/04/19 2132  NA 139  K 4.4  CL 107  CO2 24  GLUCOSE 145*  BUN 27*  CREATININE 1.09*  CALCIUM 8.6*  MG 2.1   GFR: Estimated Creatinine Clearance: 32.1 mL/min (A) (by C-G formula based on SCr of 1.09 mg/dL (H)). Liver Function Tests: Recent Labs  Lab 06/04/19 2132  AST 80*  ALT 26  ALKPHOS 172*  BILITOT 1.5*  PROT 5.9*  ALBUMIN 3.5   Recent Labs  Lab 06/04/19 2132  LIPASE 747*   No results for input(s): AMMONIA in the last 168 hours. Coagulation Profile: No results for input(s): INR, PROTIME in the last 168 hours. Cardiac Enzymes: No results for input(s): CKTOTAL, CKMB, CKMBINDEX, TROPONINI in the last 168 hours. BNP (last 3 results) No results for input(s): PROBNP in the last 8760 hours. HbA1C: No results for input(s): HGBA1C in the last 72 hours. CBG: No results for input(s): GLUCAP in the last 168 hours. Lipid Profile: No results for input(s):  CHOL, HDL, LDLCALC, TRIG, CHOLHDL, LDLDIRECT in the last 72 hours. Thyroid Function Tests: No results for input(s): TSH, T4TOTAL, FREET4, T3FREE, THYROIDAB in the last 72 hours. Anemia Panel: No results for input(s): VITAMINB12, FOLATE, FERRITIN, TIBC, IRON, RETICCTPCT in the last 72 hours. Urine analysis:    Component Value Date/Time   COLORURINE YELLOW 06/05/2019 0206   APPEARANCEUR TURBID (A) 06/05/2019 0206   LABSPEC 1.035 (H) 06/05/2019 0206   LABSPEC 1.015 11/13/2016 0848   PHURINE 6.0 06/05/2019 0206   GLUCOSEU NEGATIVE 06/05/2019 0206   GLUCOSEU Negative 11/13/2016 0848   HGBUR SMALL (A) 06/05/2019 0206   BILIRUBINUR SMALL (A) 06/05/2019 0206   BILIRUBINUR Negative 11/13/2016 0848   KETONESUR 5 (A) 06/05/2019 0206   PROTEINUR 100 (A) 06/05/2019 0206   UROBILINOGEN 0.2 11/13/2016 0848   NITRITE NEGATIVE 06/05/2019 0206   LEUKOCYTESUR NEGATIVE 06/05/2019 0206   LEUKOCYTESUR Large 11/13/2016 0848    Radiological Exams on Admission: CT ABDOMEN PELVIS W CONTRAST  Result Date: 06/05/2019 CLINICAL DATA:  Abdominal distension, history of chronic lymphocytic leukemia EXAM: CT ABDOMEN AND PELVIS WITH CONTRAST TECHNIQUE: Multidetector CT imaging of the abdomen and pelvis was performed using the standard protocol following bolus administration of intravenous contrast. CONTRAST:  31m OMNIPAQUE IOHEXOL 300 MG/ML  SOLN COMPARISON:  July 18, 2017 FINDINGS: Lower chest: The visualized heart size within normal limits. Coronary artery and aortic valve calcifications are noted. No pericardial fluid/thickening. No hiatal hernia. The visualized portions of the lungs are clear. Hepatobiliary: Multiple low-density lesions are again noted throughout the liver parenchyma the largest within the left liver lobe measuring 2.8 cm and unchanged from the prior exam. Main portal vein is patent. There appears to be hyperenhancement of the gallbladder wall with mild intrahepatic to biliary ductal dilatation.  There is mild surrounding gallbladder wall thickening/trace pericholecystic fluid. There is prominence of the common bile duct measuring up to 9 mm which tapers at the level of the ampulla. No shadowing stones are noted. Pancreas: Unremarkable. No pancreatic ductal dilatation or surrounding inflammatory changes. Spleen: Scattered low-density lesions are seen throughout the posterior spleen which appear to have increased in size from the prior exam. The largest measuring 2.6 cm, series 2, image 30 and was previously 1.1 cm. Adrenals/Urinary Tract: Both adrenal glands appear normal. There is  a hyperdense mass within the upper pole of the right kidney which is increased in size now measuring 2.3 cm in greatest dimension and previously 1.4 cm. No hydronephrosis seen. The bladder is grossly unremarkable. Stomach/Bowel: The stomach and small bowel are normal in appearance. The appendix is unremarkable. Extensive sigmoid colonic diverticulosis is noted. Surrounding mesenteric fat stranding changes are seen around a 9 cm segment of sigmoid colon. No pericolonic free air or loculated fluid collection is seen. Vascular/Lymphatic: Extensive retroperitoneal and mesenteric adenopathy is again identified. They appear to have increased in size and number since the prior exam. For reference within the aortocaval region on series 2, image 50 there is a lymph node now measuring 1.4 cm, and previously 1.1 cm in greatest dimension. There also numerous scattered pericolonic and deep pelvic lymph nodes now noted. Reproductive: The uterus and adnexa are unremarkable. Other: No evidence of abdominal wall mass or hernia. Musculoskeletal: There is interval slight worsening in the chronic superior compression deformity of the T12 vertebral body with approximately 50% loss in vertebral body height, when compared to prior radiograph of March 25, 2019. IMPRESSION: 1. Gallbladder wall thickening/hyperenhancement with a small amount of  pericholecystic fluid with mild intrahepatic and common bile duct dilatation. This could be due to cholecystitis and if further evaluation is required would recommend right upper quadrant ultrasound. 2. Interval enlargement of splenic lesions and extensive mesenteric / retroperitoneal lymph nodes. These findings are suggestive of interval progression of chronic lymphocytic leukemia. 3. Interval enlargement of hyperdense partially exophytic right upper pole renal lesion now measuring 2.3 cm, concerning for metastatic disease. 4. Findings of acute sigmoid colonic diverticulitis. No pericolonic fluid collections or free air. Electronically Signed   By: Prudencio Pair M.D.   On: 06/05/2019 00:47   DG Chest Portable 1 View  Result Date: 06/04/2019 CLINICAL DATA:  Chest pain. EXAM: PORTABLE CHEST 1 VIEW COMPARISON:  February 27, 2019 FINDINGS: The heart size is stable from prior study. Aortic calcifications are noted. There is elevation of the right hemidiaphragm with right basilar atelectasis versus scarring which is similar to prior study. There is no pneumothorax. No focal infiltrate. No significant pleural effusion. Degenerative changes are again noted of the right glenohumeral joint. IMPRESSION: No active disease. Electronically Signed   By: Constance Holster M.D.   On: 06/04/2019 22:07   US Abdomen Limited RUQ  Result Date: 06/05/2019 CLINICAL DATA:  Right upper quadrant pain EXAM: ULTRASOUND ABDOMEN LIMITED RIGHT UPPER QUADRANT COMPARISON:  None. FINDINGS: Gallbladder: There is layering gallbladder sludge and stones seen throughout. The largest measuring 7 mm. There is diffuse gallbladder wall thickening measuring up to 8.8 mm. Small amount of pericholecystic fluid is seen. There is a sonographic Murphy sign present. Common bile duct: Diameter: 10.2 mm Liver: Numerous anechoic cysts are seen throughout the liver the largest measuring 3.2 cm in the left liver lobe. Within normal limits in parenchymal  echogenicity. Portal vein is patent on color Doppler imaging with normal direction of blood flow towards the liver. Other: None. IMPRESSION: Findings suggestive of acute cholecystitis as described above. Mild common bile duct dilatation. Multiple anechoic hepatic cyst. Electronically Signed   By: Prudencio Pair M.D.   On: 06/05/2019 02:56      Assessment/Plan Principal Problem:    Acute cholecystitis  Patient presented to ED with complaints of abdominal pain and had an episode of vomiting in the ED.  CT scan and right upper quadrant ultrasound showed acute cholecystitis.  General surgery consulted by the ED  physician and they recommended to start the patient on IV antibiotics. Continue IV cefepime and IV metronidazole. N.p.o. IV morphine 1 mg every 4 hours as needed for pain IV Zofran for nausea and vomiting as needed. Continue gentle hydration with normal saline at the rate of 75 mL/h.  Active Problems:    Elevated liver enzymes Continue to monitor    Dementia without behavioral disturbance [facility]) Patient has a history of dementia according to patient's daughter.  It is in a pleasant mood without any anxiety and depression.  Continue to monitor    Diverticulitis Continue IV antibiotics Continue IV normal saline for gentle IV hydration. IV morphine for pain as needed. IV Zofran for nausea and vomiting.     DVT prophylaxis: Heparin Code Status: Patient's daughter confirmed that the CODE STATUS for patient is DNR. Family Communication: I personally had a 20 minutes conversation with patient's daughter and explained her about patient's condition in detail. Disposition Plan: Patient will be discharged to skilled nursing facility after resolution of symptoms. Consults called: General surgery consulted by ED physician Admission status: Inpatient/MedSurg   Edmonia Lynch MD [facility] Pager [phone]   If 7PM-7AM, please contact  night-coverage [email or web address] Password  06/05/2019, 5:30 AM

## 2019-06-05 NOTE — Consult Note (Signed)
Reason for Consult: Referring Physician: Rai  MD  Carla Conway is an 86 y.o. female.  HPI: Asked to see pt at the request of Dr Tana Coast for acute cholecystitis and possible diverticulitis. Pt is severely demented and history obtained from the chart.  She cannot give me any complaints but appears happy and in no distress.  The patient had an episode of abdominal pain at her care facility yesterday was brought in by EMS.  She underwent an ultrasound which showed gallstones and possible gallbladder wall thickening and possible acute diverticulitis without complicating feature.  She has a history of chronic lymphocytic leukemia and is followed by medical oncology.  She is in no distress but due to her severe dementia no history is obtainable  Past Medical History:  Diagnosis Date  . Asthma   . CAD (coronary artery disease)   . Dementia without behavioral disturbance ([place]) 06/05/2019  . Falls frequently   . Hyperlipidemia   . Hypertension     Past Surgical History:  Procedure Laterality Date  . CATARACT EXTRACTION    . CORONARY ANGIOPLASTY  10 -15 years ago   stent in [place] ,[place]  . IR FLUORO GUIDE PORT INSERTION RIGHT  10/23/2016  . IR REMOVAL TUN ACCESS W/ PORT W/O FL MOD SED  02/05/2018  . IR US GUIDE VASC ACCESS RIGHT  10/23/2016    Family History  Problem Relation Age of Onset  . Cancer Father        colon    Social History:  reports that she has quit smoking. Her smoking use included cigarettes. She started smoking about 36 years ago. She has never used smokeless tobacco. She reports that she does not drink alcohol or use drugs.  Allergies:  Allergies  Allergen Reactions  . Rituximab Other (See Comments)    "She coded"/Upset stomach and blood pressure dropped    Medications: I have reviewed the patient's current medications.  Results for orders placed or performed during the hospital encounter of 06/04/19 (from the past 48 hour(s))  Comprehensive metabolic panel     Status:  Abnormal   Collection Time: 06/04/19  9:32 PM  Result Value Ref Range   Sodium 139 135 - 145 mmol/L   Potassium 4.4 3.5 - 5.1 mmol/L   Chloride 107 98 - 111 mmol/L   CO2 24 22 - 32 mmol/L   Glucose, Bld 145 (H) 70 - 99 mg/dL    Comment: Glucose reference range applies only to samples taken after fasting for at least 8 hours.   BUN 27 (H) 8 - 23 mg/dL   Creatinine, Ser 1.09 (H) 0.44 - 1.00 mg/dL   Calcium 8.6 (L) 8.9 - 10.3 mg/dL   Total Protein 5.9 (L) 6.5 - 8.1 g/dL   Albumin 3.5 3.5 - 5.0 g/dL   AST 80 (H) 15 - 41 U/L   ALT 26 0 - 44 U/L   Alkaline Phosphatase 172 (H) 38 - 126 U/L   Total Bilirubin 1.5 (H) 0.3 - 1.2 mg/dL   GFR calc non Af Amer 45 (L) >60 mL/min   GFR calc Af Amer 52 (L) >60 mL/min   Anion gap 8 5 - 15    Comment: Performed at [facility], [place] [address] [place], [place] [address]  Lipase, blood     Status: Abnormal   Collection Time: 06/04/19  9:32 PM  Result Value Ref Range   Lipase 747 (H) 11 - 51 U/L    Comment: RESULTS CONFIRMED  BY MANUAL DILUTION Performed at [facility], [place] [address] [place], [place] [address]   CBC with Differential     Status: Abnormal   Collection Time: 06/04/19  9:32 PM  Result Value Ref Range   WBC 38.0 (H) 4.0 - 10.5 K/uL    Comment: REPEATED TO VERIFY WHITE COUNT CONFIRMED ON SMEAR    RBC 4.24 3.87 - 5.11 MIL/uL   Hemoglobin 12.5 12.0 - 15.0 g/dL   HCT 41.1 36.0 - 46.0 %   MCV 96.9 80.0 - 100.0 fL   MCH 29.5 26.0 - 34.0 pg   MCHC 30.4 30.0 - 36.0 g/dL   RDW 17.4 (H) 11.5 - 15.5 %   Platelets 173 150 - 400 K/uL   nRBC 0.0 0.0 - 0.2 %   Neutrophils Relative % 10 %   Neutro Abs 3.8 1.7 - 7.7 K/uL   Lymphocytes Relative 86 %   Lymphs Abs 32.7 (H) 0.7 - 4.0 K/uL   Monocytes Relative 3 %   Monocytes Absolute 1.1 (H) 0.1 - 1.0 K/uL   Eosinophils Relative 1 %   Eosinophils Absolute 0.4 0.0 - 0.5 K/uL   Basophils Relative 0 %   Basophils Absolute 0.0 0.0 - 0.1 K/uL   WBC  Morphology ABSOLUTE LYMPHOCYTOSIS    Abs Immature Granulocytes 0.00 0.00 - 0.07 K/uL   Reactive, Benign Lymphocytes PRESENT    Smudge Cells PRESENT    Polychromasia PRESENT     Comment: Performed at [facility], [place] [address] [place], [place] [address]  Troponin I (High Sensitivity)     Status: None   Collection Time: 06/04/19  9:32 PM  Result Value Ref Range   Troponin I (High Sensitivity) 7 <18 ng/L    Comment: (NOTE) Elevated high sensitivity troponin I (hsTnI) values and significant  changes across serial measurements may suggest ACS but many other  chronic and acute conditions are known to elevate hsTnI results.  Refer to the "Links" section for chest pain algorithms and additional  guidance. Performed at [facility], [place] [address] [place], [place] [address]   Magnesium     Status: None   Collection Time: 06/04/19  9:32 PM  Result Value Ref Range   Magnesium 2.1 1.7 - 2.4 mg/dL    Comment: RESULTS CONFIRMED BY MANUAL DILUTION Performed at [place] [address] [place], [place] [address]   Troponin I (High Sensitivity)     Status: None   Collection Time: 06/05/19 12:37 AM  Result Value Ref Range   Troponin I (High Sensitivity) 8 <18 ng/L    Comment: (NOTE) Elevated high sensitivity troponin I (hsTnI) values and significant  changes across serial measurements may suggest ACS but many other  chronic and acute conditions are known to elevate hsTnI results.  Refer to the "Links" section for chest pain algorithms and additional  guidance. Performed at [facility], [place] [address] [place],  [address]   Urinalysis, Routine w reflex microscopic     Status: Abnormal   Collection Time: 06/05/19  2:06 AM  Result Value Ref Range   Color, Urine YELLOW YELLOW   APPearance TURBID (A) CLEAR   Specific Gravity, Urine 1.035 (H) 1.005 - 1.030   pH 6.0 5.0 - 8.0   Glucose, UA NEGATIVE  NEGATIVE mg/dL   Hgb urine dipstick SMALL (A) NEGATIVE   Bilirubin Urine SMALL (A) NEGATIVE   Ketones, ur 5 (A) NEGATIVE mg/dL   Protein, ur  100 (A) NEGATIVE mg/dL   Nitrite NEGATIVE NEGATIVE   Leukocytes,Ua NEGATIVE NEGATIVE   RBC / HPF 6-10 0 - 5 RBC/hpf   WBC, UA 11-20 0 - 5 WBC/hpf   Bacteria, UA MANY (A) NONE SEEN   Squamous Epithelial / LPF 0-5 0 - 5   Mucus PRESENT    Ca Oxalate Crys, UA PRESENT     Comment: Performed at [facility], [place] [address] [place], [place] [address]  Urine culture     Status: None (Preliminary result)   Collection Time: 06/05/19  2:06 AM   Specimen: Urine, Catheterized  Result Value Ref Range   Specimen Description      URINE, CATHETERIZED Performed at [facility], [place] [address] [place], [place] [address]    Special Requests NONE    Culture PENDING    Report Status PENDING   Respiratory Panel by RT PCR (Flu A&B, Covid) - Nasopharyngeal Swab     Status: None   Collection Time: 06/05/19  4:02 AM   Specimen: Nasopharyngeal Swab  Result Value Ref Range   SARS Coronavirus 2 by RT PCR NEGATIVE NEGATIVE    Comment: (NOTE) SARS-CoV-2 target nucleic acids are NOT DETECTED. The SARS-CoV-2 RNA is generally detectable in upper respiratoy specimens during the acute phase of infection. The lowest concentration of SARS-CoV-2 viral copies this assay can detect is 131 copies/mL. A negative result does not preclude SARS-Cov-2 infection and should not be used as the sole basis for treatment or other patient management decisions. A negative result may occur with  improper specimen collection/handling, submission of specimen other than nasopharyngeal swab, presence of viral mutation(s) within the areas targeted by this assay, and inadequate number of viral copies (<131 copies/mL). A negative result must be combined with clinical observations, patient history, and epidemiological information. The expected result is  Negative. Fact Sheet for Patients:  PinkCheek.be Fact Sheet for Healthcare Providers:  GravelBags.it This test is not yet ap proved or cleared by the [place] FDA and  has been authorized for detection and/or diagnosis of SARS-CoV-2 by FDA under an Emergency Use Authorization (EUA). This EUA will remain  in effect (meaning this test can be used) for the duration of the COVID-19 declaration under Section 564(b)(1) of the Act, 21 U.S.C. section 360bbb-3(b)(1), unless the authorization is terminated or revoked sooner.    Influenza A by PCR NEGATIVE NEGATIVE   Influenza B by PCR NEGATIVE NEGATIVE    Comment: (NOTE) The Xpert Xpress SARS-CoV-2/FLU/RSV assay is intended as an aid in  the diagnosis of influenza from Nasopharyngeal swab specimens and  should not be used as a sole basis for treatment. Nasal washings and  aspirates are unacceptable for Xpert Xpress SARS-CoV-2/FLU/RSV  testing. Fact Sheet for Patients: PinkCheek.be Fact Sheet for Healthcare Providers: GravelBags.it This test is not yet approved or cleared by the [place] FDA and  has been authorized for detection and/or diagnosis of SARS-CoV-2 by  FDA under an Emergency Use Authorization (EUA). This EUA will remain  in effect (meaning this test can be used) for the duration of the  Covid-19 declaration under Section 564(b)(1) of the Act, 21  U.S.C. section 360bbb-3(b)(1), unless the authorization is  terminated or revoked. Performed at [facility], [place] [address] [place], [place] [address]   CBC     Status: Abnormal   Collection Time: 06/05/19  5:50 AM  Result Value Ref Range   WBC 35.9 (H) 4.0 - 10.5 K/uL  RBC 4.19 3.87 - 5.11 MIL/uL   Hemoglobin 12.4 12.0 - 15.0 g/dL   HCT 40.0 36.0 - 46.0 %   MCV 95.5 80.0 - 100.0 fL   MCH 29.6 26.0 - 34.0 pg   MCHC 31.0 30.0 - 36.0 g/dL    RDW 17.3 (H) 11.5 - 15.5 %   Platelets 160 150 - 400 K/uL   nRBC 0.0 0.0 - 0.2 %    Comment: Performed at [facility], [place] [address] [place], [place] [address]  Comprehensive metabolic panel     Status: Abnormal   Collection Time: 06/05/19  5:50 AM  Result Value Ref Range   Sodium 141 135 - 145 mmol/L   Potassium 4.4 3.5 - 5.1 mmol/L   Chloride 107 98 - 111 mmol/L   CO2 24 22 - 32 mmol/L   Glucose, Bld 117 (H) 70 - 99 mg/dL    Comment: Glucose reference range applies only to samples taken after fasting for at least 8 hours.   BUN 27 (H) 8 - 23 mg/dL   Creatinine, Ser 1.07 (H) 0.44 - 1.00 mg/dL   Calcium 8.7 (L) 8.9 - 10.3 mg/dL   Total Protein 6.1 (L) 6.5 - 8.1 g/dL   Albumin 3.6 3.5 - 5.0 g/dL   AST 51 (H) 15 - 41 U/L   ALT 27 0 - 44 U/L   Alkaline Phosphatase 169 (H) 38 - 126 U/L   Total Bilirubin 0.7 0.3 - 1.2 mg/dL   GFR calc non Af Amer 46 (L) >60 mL/min   GFR calc Af Amer 53 (L) >60 mL/min   Anion gap 10 5 - 15    Comment: Performed at [facility], [place] [address], [place],  [address]    CT ABDOMEN PELVIS W CONTRAST  Result Date: 06/05/2019 CLINICAL DATA:  Abdominal distension, history of chronic lymphocytic leukemia EXAM: CT ABDOMEN AND PELVIS WITH CONTRAST TECHNIQUE: Multidetector CT imaging of the abdomen and pelvis was performed using the standard protocol following bolus administration of intravenous contrast. CONTRAST:  36m OMNIPAQUE IOHEXOL 300 MG/ML  SOLN COMPARISON:  July 18, 2017 FINDINGS: Lower chest: The visualized heart size within normal limits. Coronary artery and aortic valve calcifications are noted. No pericardial fluid/thickening. No hiatal hernia. The visualized portions of the lungs are clear. Hepatobiliary: Multiple low-density lesions are again noted throughout the liver parenchyma the largest within the left liver lobe measuring 2.8 cm and unchanged from the prior exam. Main portal vein is patent. There  appears to be hyperenhancement of the gallbladder wall with mild intrahepatic to biliary ductal dilatation. There is mild surrounding gallbladder wall thickening/trace pericholecystic fluid. There is prominence of the common bile duct measuring up to 9 mm which tapers at the level of the ampulla. No shadowing stones are noted. Pancreas: Unremarkable. No pancreatic ductal dilatation or surrounding inflammatory changes. Spleen: Scattered low-density lesions are seen throughout the posterior spleen which appear to have increased in size from the prior exam. The largest measuring 2.6 cm, series 2, image 30 and was previously 1.1 cm. Adrenals/Urinary Tract: Both adrenal glands appear normal. There is a hyperdense mass within the upper pole of the right kidney which is increased in size now measuring 2.3 cm in greatest dimension and previously 1.4 cm. No hydronephrosis seen. The bladder is grossly unremarkable. Stomach/Bowel: The stomach and small bowel are normal in appearance. The appendix is unremarkable. Extensive sigmoid colonic diverticulosis is noted. Surrounding mesenteric fat stranding changes are seen around a 9  cm segment of sigmoid colon. No pericolonic free air or loculated fluid collection is seen. Vascular/Lymphatic: Extensive retroperitoneal and mesenteric adenopathy is again identified. They appear to have increased in size and number since the prior exam. For reference within the aortocaval region on series 2, image 50 there is a lymph node now measuring 1.4 cm, and previously 1.1 cm in greatest dimension. There also numerous scattered pericolonic and deep pelvic lymph nodes now noted. Reproductive: The uterus and adnexa are unremarkable. Other: No evidence of abdominal wall mass or hernia. Musculoskeletal: There is interval slight worsening in the chronic superior compression deformity of the T12 vertebral body with approximately 50% loss in vertebral body height, when compared to prior radiograph of  March 25, 2019. IMPRESSION: 1. Gallbladder wall thickening/hyperenhancement with a small amount of pericholecystic fluid with mild intrahepatic and common bile duct dilatation. This could be due to cholecystitis and if further evaluation is required would recommend right upper quadrant ultrasound. 2. Interval enlargement of splenic lesions and extensive mesenteric / retroperitoneal lymph nodes. These findings are suggestive of interval progression of chronic lymphocytic leukemia. 3. Interval enlargement of hyperdense partially exophytic right upper pole renal lesion now measuring 2.3 cm, concerning for metastatic disease. 4. Findings of acute sigmoid colonic diverticulitis. No pericolonic fluid collections or free air. Electronically Signed   By: Prudencio Pair M.D.   On: 06/05/2019 00:47   DG Chest Portable 1 View  Result Date: 06/04/2019 CLINICAL DATA:  Chest pain. EXAM: PORTABLE CHEST 1 VIEW COMPARISON:  February 27, 2019 FINDINGS: The heart size is stable from prior study. Aortic calcifications are noted. There is elevation of the right hemidiaphragm with right basilar atelectasis versus scarring which is similar to prior study. There is no pneumothorax. No focal infiltrate. No significant pleural effusion. Degenerative changes are again noted of the right glenohumeral joint. IMPRESSION: No active disease. Electronically Signed   By: Constance Holster M.D.   On: 06/04/2019 22:07   US Abdomen Limited RUQ  Result Date: 06/05/2019 CLINICAL DATA:  Right upper quadrant pain EXAM: ULTRASOUND ABDOMEN LIMITED RIGHT UPPER QUADRANT COMPARISON:  None. FINDINGS: Gallbladder: There is layering gallbladder sludge and stones seen throughout. The largest measuring 7 mm. There is diffuse gallbladder wall thickening measuring up to 8.8 mm. Small amount of pericholecystic fluid is seen. There is a sonographic Murphy sign present. Common bile duct: Diameter: 10.2 mm Liver: Numerous anechoic cysts are seen throughout the  liver the largest measuring 3.2 cm in the left liver lobe. Within normal limits in parenchymal echogenicity. Portal vein is patent on color Doppler imaging with normal direction of blood flow towards the liver. Other: None. IMPRESSION: Findings suggestive of acute cholecystitis as described above. Mild common bile duct dilatation. Multiple anechoic hepatic cyst. Electronically Signed   By: Prudencio Pair M.D.   On: 06/05/2019 02:56    Review of Systems  Unable to perform ROS: Dementia   Blood pressure (!) 169/73, pulse 69, temperature 98.5 F (36.9 C), temperature source Oral, resp. rate 14, weight 61.2 kg, SpO2 95 %. Physical Exam  Constitutional: She appears well-developed and well-nourished. No distress.  HENT:  Head: Normocephalic and atraumatic.  Eyes: Pupils are equal, round, and reactive to light. No scleral icterus.  Neck: No tracheal deviation present.  Cardiovascular: Normal rate and regular rhythm.  Respiratory: Effort normal and breath sounds normal. No respiratory distress.  GI: Soft. She exhibits no distension and no mass. There is no abdominal tenderness. There is no rebound and no guarding.  Musculoskeletal:        General: No deformity or edema.     Cervical back: Normal range of motion and neck supple.  Neurological: She is disoriented.  Severe dementia.  She will open her eyes but is unable to understand or communicate effectively.  Skin: Skin is warm and dry.  Psychiatric: Her mood appears not anxious. She is not agitated. Cognition and memory are impaired.    Assessment/Plan: cholelithiasis with questionable cholecystitis- norma exam this am Recommend medical management at this point  Not sure she has cholecystitis at this point   CLL- leukocytosis probably secondary to this    Diverticulitis - normal exam so suspect diverticulosis at this point  Can treat with ABX for now   OK to feed   No plans for surgery at this point     [place] 06/05/2019,  7:53 AM

## 2019-06-05 NOTE — ED Provider Notes (Addendum)
Nursing notes and vitals signs, including pulse oximetry, reviewed.  Summary of this visit's results, reviewed by myself:  EKG:  EKG Interpretation  Date/Time:  Saturday June 04 2019 21:35:54 EST Ventricular Rate:  59 PR Interval:    QRS Duration: 103 QT Interval:  566 QTC Calculation: 561 R Axis:   66 Text Interpretation: Sinus rhythm RSR' in V1 or V2, right VCD or RVH Abnormal T, consider ischemia, anterior leads Prolonged QT interval No STEMI Confirmed by Nanda Quinton [phone]) on 06/04/2019 9:41:40 PM       Labs:  Results for orders placed or performed during the hospital encounter of 06/04/19 (from the past 24 hour(s))  Comprehensive metabolic panel     Status: Abnormal   Collection Time: 06/04/19  9:32 PM  Result Value Ref Range   Sodium 139 135 - 145 mmol/L   Potassium 4.4 3.5 - 5.1 mmol/L   Chloride 107 98 - 111 mmol/L   CO2 24 22 - 32 mmol/L   Glucose, Bld 145 (H) 70 - 99 mg/dL   BUN 27 (H) 8 - 23 mg/dL   Creatinine, Ser 1.09 (H) 0.44 - 1.00 mg/dL   Calcium 8.6 (L) 8.9 - 10.3 mg/dL   Total Protein 5.9 (L) 6.5 - 8.1 g/dL   Albumin 3.5 3.5 - 5.0 g/dL   AST 80 (H) 15 - 41 U/L   ALT 26 0 - 44 U/L   Alkaline Phosphatase 172 (H) 38 - 126 U/L   Total Bilirubin 1.5 (H) 0.3 - 1.2 mg/dL   GFR calc non Af Amer 45 (L) >60 mL/min   GFR calc Af Amer 52 (L) >60 mL/min   Anion gap 8 5 - 15  Lipase, blood     Status: Abnormal   Collection Time: 06/04/19  9:32 PM  Result Value Ref Range   Lipase 747 (H) 11 - 51 U/L  CBC with Differential     Status: Abnormal   Collection Time: 06/04/19  9:32 PM  Result Value Ref Range   WBC 38.0 (H) 4.0 - 10.5 K/uL   RBC 4.24 3.87 - 5.11 MIL/uL   Hemoglobin 12.5 12.0 - 15.0 g/dL   HCT 41.1 36.0 - 46.0 %   MCV 96.9 80.0 - 100.0 fL   MCH 29.5 26.0 - 34.0 pg   MCHC 30.4 30.0 - 36.0 g/dL   RDW 17.4 (H) 11.5 - 15.5 %   Platelets 173 150 - 400 K/uL   nRBC 0.0 0.0 - 0.2 %   Neutrophils Relative % 10 %   Neutro Abs 3.8 1.7 - 7.7 K/uL    Lymphocytes Relative 86 %   Lymphs Abs 32.7 (H) 0.7 - 4.0 K/uL   Monocytes Relative 3 %   Monocytes Absolute 1.1 (H) 0.1 - 1.0 K/uL   Eosinophils Relative 1 %   Eosinophils Absolute 0.4 0.0 - 0.5 K/uL   Basophils Relative 0 %   Basophils Absolute 0.0 0.0 - 0.1 K/uL   WBC Morphology ABSOLUTE LYMPHOCYTOSIS    Abs Immature Granulocytes 0.00 0.00 - 0.07 K/uL   Reactive, Benign Lymphocytes PRESENT    Smudge Cells PRESENT    Polychromasia PRESENT   Troponin I (High Sensitivity)     Status: None   Collection Time: 06/04/19  9:32 PM  Result Value Ref Range   Troponin I (High Sensitivity) 7 <18 ng/L  Magnesium     Status: None   Collection Time: 06/04/19  9:32 PM  Result Value Ref Range   Magnesium 2.1 1.7 -  2.4 mg/dL  Troponin I (High Sensitivity)     Status: None   Collection Time: 06/05/19 12:37 AM  Result Value Ref Range   Troponin I (High Sensitivity) 8 <18 ng/L  Urinalysis, Routine w reflex microscopic     Status: Abnormal   Collection Time: 06/05/19  2:06 AM  Result Value Ref Range   Color, Urine YELLOW YELLOW   APPearance TURBID (A) CLEAR   Specific Gravity, Urine 1.035 (H) 1.005 - 1.030   pH 6.0 5.0 - 8.0   Glucose, UA NEGATIVE NEGATIVE mg/dL   Hgb urine dipstick SMALL (A) NEGATIVE   Bilirubin Urine SMALL (A) NEGATIVE   Ketones, ur 5 (A) NEGATIVE mg/dL   Protein, ur 100 (A) NEGATIVE mg/dL   Nitrite NEGATIVE NEGATIVE   Leukocytes,Ua NEGATIVE NEGATIVE   RBC / HPF 6-10 0 - 5 RBC/hpf   WBC, UA 11-20 0 - 5 WBC/hpf   Bacteria, UA MANY (A) NONE SEEN   Squamous Epithelial / LPF 0-5 0 - 5   Mucus PRESENT    Ca Oxalate Crys, UA PRESENT   Urine culture     Status: None (Preliminary result)   Collection Time: 06/05/19  2:06 AM   Specimen: Urine, Catheterized  Result Value Ref Range   Specimen Description      URINE, CATHETERIZED Performed at [facility], [place] [address] [place], [place] [address]    Special Requests NONE    Culture PENDING    Report  Status PENDING     Imaging Studies: CT ABDOMEN PELVIS W CONTRAST  Result Date: 06/05/2019 CLINICAL DATA:  Abdominal distension, history of chronic lymphocytic leukemia EXAM: CT ABDOMEN AND PELVIS WITH CONTRAST TECHNIQUE: Multidetector CT imaging of the abdomen and pelvis was performed using the standard protocol following bolus administration of intravenous contrast. CONTRAST:  19m OMNIPAQUE IOHEXOL 300 MG/ML  SOLN COMPARISON:  July 18, 2017 FINDINGS: Lower chest: The visualized heart size within normal limits. Coronary artery and aortic valve calcifications are noted. No pericardial fluid/thickening. No hiatal hernia. The visualized portions of the lungs are clear. Hepatobiliary: Multiple low-density lesions are again noted throughout the liver parenchyma the largest within the left liver lobe measuring 2.8 cm and unchanged from the prior exam. Main portal vein is patent. There appears to be hyperenhancement of the gallbladder wall with mild intrahepatic to biliary ductal dilatation. There is mild surrounding gallbladder wall thickening/trace pericholecystic fluid. There is prominence of the common bile duct measuring up to 9 mm which tapers at the level of the ampulla. No shadowing stones are noted. Pancreas: Unremarkable. No pancreatic ductal dilatation or surrounding inflammatory changes. Spleen: Scattered low-density lesions are seen throughout the posterior spleen which appear to have increased in size from the prior exam. The largest measuring 2.6 cm, series 2, image 30 and was previously 1.1 cm. Adrenals/Urinary Tract: Both adrenal glands appear normal. There is a hyperdense mass within the upper pole of the right kidney which is increased in size now measuring 2.3 cm in greatest dimension and previously 1.4 cm. No hydronephrosis seen. The bladder is grossly unremarkable. Stomach/Bowel: The stomach and small bowel are normal in appearance. The appendix is unremarkable. Extensive sigmoid colonic  diverticulosis is noted. Surrounding mesenteric fat stranding changes are seen around a 9 cm segment of sigmoid colon. No pericolonic free air or loculated fluid collection is seen. Vascular/Lymphatic: Extensive retroperitoneal and mesenteric adenopathy is again identified. They appear to have increased in size and number since the prior exam. For reference within the  aortocaval region on series 2, image 50 there is a lymph node now measuring 1.4 cm, and previously 1.1 cm in greatest dimension. There also numerous scattered pericolonic and deep pelvic lymph nodes now noted. Reproductive: The uterus and adnexa are unremarkable. Other: No evidence of abdominal wall mass or hernia. Musculoskeletal: There is interval slight worsening in the chronic superior compression deformity of the T12 vertebral body with approximately 50% loss in vertebral body height, when compared to prior radiograph of March 25, 2019. IMPRESSION: 1. Gallbladder wall thickening/hyperenhancement with a small amount of pericholecystic fluid with mild intrahepatic and common bile duct dilatation. This could be due to cholecystitis and if further evaluation is required would recommend right upper quadrant ultrasound. 2. Interval enlargement of splenic lesions and extensive mesenteric / retroperitoneal lymph nodes. These findings are suggestive of interval progression of chronic lymphocytic leukemia. 3. Interval enlargement of hyperdense partially exophytic right upper pole renal lesion now measuring 2.3 cm, concerning for metastatic disease. 4. Findings of acute sigmoid colonic diverticulitis. No pericolonic fluid collections or free air. Electronically Signed   By: Prudencio Pair M.D.   On: 06/05/2019 00:47   DG Chest Portable 1 View  Result Date: 06/04/2019 CLINICAL DATA:  Chest pain. EXAM: PORTABLE CHEST 1 VIEW COMPARISON:  February 27, 2019 FINDINGS: The heart size is stable from prior study. Aortic calcifications are noted. There is  elevation of the right hemidiaphragm with right basilar atelectasis versus scarring which is similar to prior study. There is no pneumothorax. No focal infiltrate. No significant pleural effusion. Degenerative changes are again noted of the right glenohumeral joint. IMPRESSION: No active disease. Electronically Signed   By: Constance Holster M.D.   On: 06/04/2019 22:07   US Abdomen Limited RUQ  Result Date: 06/05/2019 CLINICAL DATA:  Right upper quadrant pain EXAM: ULTRASOUND ABDOMEN LIMITED RIGHT UPPER QUADRANT COMPARISON:  None. FINDINGS: Gallbladder: There is layering gallbladder sludge and stones seen throughout. The largest measuring 7 mm. There is diffuse gallbladder wall thickening measuring up to 8.8 mm. Small amount of pericholecystic fluid is seen. There is a sonographic Murphy sign present. Common bile duct: Diameter: 10.2 mm Liver: Numerous anechoic cysts are seen throughout the liver the largest measuring 3.2 cm in the left liver lobe. Within normal limits in parenchymal echogenicity. Portal vein is patent on color Doppler imaging with normal direction of blood flow towards the liver. Other: None. IMPRESSION: Findings suggestive of acute cholecystitis as described above. Mild common bile duct dilatation. Multiple anechoic hepatic cyst. Electronically Signed   By: Prudencio Pair M.D.   On: 06/05/2019 02:56   3:46 AM Dr. Humphrey Rolls to admit to hospitalist service.  Dr. Harlow Asa of general surgery consulted.  Patient given cefepime and Flagyl IV for treatment of cholecystitis and diverticulitis.     Heath Badon, Jenny Reichmann, MD 06/05/19 0348    Shanon Rosser, MD 06/05/19 0350    Shanon Rosser, MD 06/05/19 [phone]

## 2019-06-05 NOTE — Progress Notes (Signed)
[facility]                                                                              Patient Demographics  Carla Conway, is a 86 y.o. female, DOB - December 14, 1926, IOX:[id number]  Admit date - 06/04/2019   Admitting Physician Edmonia Lynch, DO  Outpatient Primary MD for the patient is Koirala, Dibas, MD  Outpatient specialists:   LOS - 0  days   Medical records reviewed and are as summarized below:    Chief Complaint  Patient presents with  . Abdominal Pain       Brief summary   Patient is a 86 year old female with hypertension, hyperlipidemia, CAD, dementia, CLL presented to ED from SNF for abdominal pain.  Unable to obtain history from the patient due to severe dementia.  Per EMS patient had suddenly started complaining of abdominal pain around 7 PM on the evening of admission.  No fever chills nausea or vomiting or any diarrhea. In ED, temp 97.9, BP 142/58, heart rate 62, respiratory 20, WBC count 38 (history of CLL), creatinine 1.09, lipase 747, alkaline phosphatase 172, AST 80, total bili 1.5  CT abdomen showed gallbladder wall thickening/hyperenhancement with small amount of pericholecystic fluid, mild intrahepatic and CBD dilation could be due to cholecystitis. acute sigmoid diverticulitis, interval enlargement of splenic lesions and extensive mesenteric/retroperitoneal lymph nodes, suggestive of interval progression of CLL.  Exophytic right upper pole renal lesion now measuring 2.3 cm concerning for metastatic disease. Abdominal ultrasound showed acute cholecystitis, mild CBD dilation   Assessment & Plan    Principal Problem:   Acute cholecystitis with pancreatitis -Patient presented with severe abdominal pain with elevated lipase 747, mildly elevated transaminitis, imaging consistent with cholecystitis, sigmoid diverticulitis. -Patient was placed on n.p.o. status, IV fluids, IV antibiotics -Patient seen by general surgery, normal exam, patient not  complaining of any abdominal pain at this time, not sure she has cholecystitis, recommended medical management, IV antibiotics. - will start on clear liquid diet, follow lipase, continue IV fluids  Active Problems: Acute sigmoid diverticulitis -Currently no nausea vomiting or any severe abdominal pain, patient started on IV cefepime and Flagyl -Placed on clear liquid diet    Dementia without behavioral disturbance (HCC) -Pleasantly confused, no acute issues -Continue Zyprexa, sertraline  Essential hypertension -Resume metoprolol, amlodipine  Hyperlipidemia Hold statin due to transaminitis  History of CLL, with leukocytosis -Patient has been on prednisone, was reduced to 5 mg daily in 2019 by Dr. Alvy Bimler -Holding prednisone for now due to acute abdominal issues including cholecystitis, pancreatitis, diverticulitis -Currently in hospice care   Code Status: DNR DVT Prophylaxis: Heparin subcu Family Communication: Discussed all imaging results, lab results, explained to the patient's daughter on the phone   Disposition Plan: Patient from [place] nursing home.  Anticipated discharge to the nursing home once acute abdominal issues are resolved, patient is tolerating solid diet, hopefully in next 24 to 48 hours.  Time Spent in minutes 35 minutes  Procedures:  CT abdomen, right upper quadrant ultrasound  Consultants:   General surgery  Antimicrobials:   Anti-infectives (From admission, onward)   Start  Dose/Rate Route Frequency Ordered Stop   06/05/19 1000  ceFEPIme (MAXIPIME) 2 g in sodium chloride 0.9 % 100 mL IVPB     2 g 200 mL/hr over 30 Minutes Intravenous Every 12 hours 06/05/19 0452     06/05/19 0800  metroNIDAZOLE (FLAGYL) IVPB 500 mg    Note to Pharmacy: Pharmacy to adjust the timing for next dose   500 mg 100 mL/hr over 60 Minutes Intravenous Every 8 hours 06/05/19 0443     06/05/19 0130  ceFEPIme (MAXIPIME) 2 g in sodium chloride 0.9 % 100 mL IVPB     2  g 200 mL/hr over 30 Minutes Intravenous  Once 06/05/19 0121 06/05/19 0243   06/05/19 0130  metroNIDAZOLE (FLAGYL) IVPB 500 mg     500 mg 100 mL/hr over 60 Minutes Intravenous  Once 06/05/19 0121 06/05/19 0340          Medications  Scheduled Meds: . heparin  5,000 Units Subcutaneous Q8H  . sodium chloride (PF)       Continuous Infusions: . sodium chloride 75 mL/hr at 06/05/19 0542  . ceFEPime (MAXIPIME) IV    . metronidazole 500 mg (06/05/19 0956)   PRN Meds:.acetaminophen **OR** acetaminophen, morphine injection      Subjective:   Carla Conway was seen and examined today.  Pleasantly confused, no nausea vomiting or any severe abdominal pain.  No abdominal tenderness on examination.  No chest pain or shortness of breath.  Difficult to obtain review of system from the patient however appears to be fairly comfortable.  Objective:   Vitals:   06/05/19 0400 06/05/19 0402 06/05/19 0500 06/05/19 0611  BP: (!) 176/77  (!) 142/63 (!) 169/73  Pulse: 76  71 69  Resp: 17  17 14   Temp:    98.5 F (36.9 C)  TempSrc:    Oral  SpO2:   95% 95%  Weight:  61.2 kg      Intake/Output Summary (Last 24 hours) at 06/05/2019 1057 Last data filed at 06/05/2019 8502 Gross per 24 hour  Intake 0 ml  Output --  Net 0 ml     Wt Readings from Last 3 Encounters:  06/05/19 61.2 kg  05/20/19 61.2 kg  03/25/19 70.3 kg     Exam  General: Alert and oriented x self, pleasantly confused, underlying history of dementia, comfortable  Cardiovascular: S1 S2 auscultated, no murmurs, RRR  Respiratory: Clear to auscultation bilaterally, no wheezing, rales or rhonchi  Gastrointestinal: Soft, nontender, nondistended, + bowel sounds  Ext: no pedal edema bilaterally  Neuro: Moving all 4 extremities  Musculoskeletal: No digital cyanosis, clubbing  Skin: No rashes  Psych: Dementia   Data Reviewed:  I have personally reviewed following labs and imaging studies  Micro Results Recent  Results (from the past 240 hour(s))  Urine culture     Status: None (Preliminary result)   Collection Time: 06/05/19  2:06 AM   Specimen: Urine, Catheterized  Result Value Ref Range Status   Specimen Description   Final    URINE, CATHETERIZED Performed at [facility], [place] [address] [place], [place] [address]    Special Requests NONE  Final   Culture PENDING  Incomplete   Report Status PENDING  Incomplete  Respiratory Panel by RT PCR (Flu A&B, Covid) - Nasopharyngeal Swab     Status: None   Collection Time: 06/05/19  4:02 AM   Specimen: Nasopharyngeal Swab  Result Value Ref Range Status   SARS Coronavirus 2 by RT  PCR NEGATIVE NEGATIVE Final    Comment: (NOTE) SARS-CoV-2 target nucleic acids are NOT DETECTED. The SARS-CoV-2 RNA is generally detectable in upper respiratoy specimens during the acute phase of infection. The lowest concentration of SARS-CoV-2 viral copies this assay can detect is 131 copies/mL. A negative result does not preclude SARS-Cov-2 infection and should not be used as the sole basis for treatment or other patient management decisions. A negative result may occur with  improper specimen collection/handling, submission of specimen other than nasopharyngeal swab, presence of viral mutation(s) within the areas targeted by this assay, and inadequate number of viral copies (<131 copies/mL). A negative result must be combined with clinical observations, patient history, and epidemiological information. The expected result is Negative. Fact Sheet for Patients:  PinkCheek.be Fact Sheet for Healthcare Providers:  GravelBags.it This test is not yet ap proved or cleared by the [place] FDA and  has been authorized for detection and/or diagnosis of SARS-CoV-2 by FDA under an Emergency Use Authorization (EUA). This EUA will remain  in effect (meaning this test can be used) for the duration  of the COVID-19 declaration under Section 564(b)(1) of the Act, 21 U.S.C. section 360bbb-3(b)(1), unless the authorization is terminated or revoked sooner.    Influenza A by PCR NEGATIVE NEGATIVE Final   Influenza B by PCR NEGATIVE NEGATIVE Final    Comment: (NOTE) The Xpert Xpress SARS-CoV-2/FLU/RSV assay is intended as an aid in  the diagnosis of influenza from Nasopharyngeal swab specimens and  should not be used as a sole basis for treatment. Nasal washings and  aspirates are unacceptable for Xpert Xpress SARS-CoV-2/FLU/RSV  testing. Fact Sheet for Patients: PinkCheek.be Fact Sheet for Healthcare Providers: GravelBags.it This test is not yet approved or cleared by the [place] FDA and  has been authorized for detection and/or diagnosis of SARS-CoV-2 by  FDA under an Emergency Use Authorization (EUA). This EUA will remain  in effect (meaning this test can be used) for the duration of the  Covid-19 declaration under Section 564(b)(1) of the Act, 21  U.S.C. section 360bbb-3(b)(1), unless the authorization is  terminated or revoked. Performed at [facility], [place] [address], [place], [place] [address]     Radiology Reports CT ABDOMEN PELVIS W CONTRAST  Result Date: 06/05/2019 CLINICAL DATA:  Abdominal distension, history of chronic lymphocytic leukemia EXAM: CT ABDOMEN AND PELVIS WITH CONTRAST TECHNIQUE: Multidetector CT imaging of the abdomen and pelvis was performed using the standard protocol following bolus administration of intravenous contrast. CONTRAST:  20m OMNIPAQUE IOHEXOL 300 MG/ML  SOLN COMPARISON:  July 18, 2017 FINDINGS: Lower chest: The visualized heart size within normal limits. Coronary artery and aortic valve calcifications are noted. No pericardial fluid/thickening. No hiatal hernia. The visualized portions of the lungs are clear. Hepatobiliary: Multiple low-density lesions are again  noted throughout the liver parenchyma the largest within the left liver lobe measuring 2.8 cm and unchanged from the prior exam. Main portal vein is patent. There appears to be hyperenhancement of the gallbladder wall with mild intrahepatic to biliary ductal dilatation. There is mild surrounding gallbladder wall thickening/trace pericholecystic fluid. There is prominence of the common bile duct measuring up to 9 mm which tapers at the level of the ampulla. No shadowing stones are noted. Pancreas: Unremarkable. No pancreatic ductal dilatation or surrounding inflammatory changes. Spleen: Scattered low-density lesions are seen throughout the posterior spleen which appear to have increased in size from the prior exam. The largest measuring 2.6 cm, series 2, image 30 and  was previously 1.1 cm. Adrenals/Urinary Tract: Both adrenal glands appear normal. There is a hyperdense mass within the upper pole of the right kidney which is increased in size now measuring 2.3 cm in greatest dimension and previously 1.4 cm. No hydronephrosis seen. The bladder is grossly unremarkable. Stomach/Bowel: The stomach and small bowel are normal in appearance. The appendix is unremarkable. Extensive sigmoid colonic diverticulosis is noted. Surrounding mesenteric fat stranding changes are seen around a 9 cm segment of sigmoid colon. No pericolonic free air or loculated fluid collection is seen. Vascular/Lymphatic: Extensive retroperitoneal and mesenteric adenopathy is again identified. They appear to have increased in size and number since the prior exam. For reference within the aortocaval region on series 2, image 50 there is a lymph node now measuring 1.4 cm, and previously 1.1 cm in greatest dimension. There also numerous scattered pericolonic and deep pelvic lymph nodes now noted. Reproductive: The uterus and adnexa are unremarkable. Other: No evidence of abdominal wall mass or hernia. Musculoskeletal: There is interval slight worsening in  the chronic superior compression deformity of the T12 vertebral body with approximately 50% loss in vertebral body height, when compared to prior radiograph of March 25, 2019. IMPRESSION: 1. Gallbladder wall thickening/hyperenhancement with a small amount of pericholecystic fluid with mild intrahepatic and common bile duct dilatation. This could be due to cholecystitis and if further evaluation is required would recommend right upper quadrant ultrasound. 2. Interval enlargement of splenic lesions and extensive mesenteric / retroperitoneal lymph nodes. These findings are suggestive of interval progression of chronic lymphocytic leukemia. 3. Interval enlargement of hyperdense partially exophytic right upper pole renal lesion now measuring 2.3 cm, concerning for metastatic disease. 4. Findings of acute sigmoid colonic diverticulitis. No pericolonic fluid collections or free air. Electronically Signed   By: Prudencio Pair M.D.   On: 06/05/2019 00:47   DG Chest Portable 1 View  Result Date: 06/04/2019 CLINICAL DATA:  Chest pain. EXAM: PORTABLE CHEST 1 VIEW COMPARISON:  February 27, 2019 FINDINGS: The heart size is stable from prior study. Aortic calcifications are noted. There is elevation of the right hemidiaphragm with right basilar atelectasis versus scarring which is similar to prior study. There is no pneumothorax. No focal infiltrate. No significant pleural effusion. Degenerative changes are again noted of the right glenohumeral joint. IMPRESSION: No active disease. Electronically Signed   By: Constance Holster M.D.   On: 06/04/2019 22:07   US Abdomen Limited RUQ  Result Date: 06/05/2019 CLINICAL DATA:  Right upper quadrant pain EXAM: ULTRASOUND ABDOMEN LIMITED RIGHT UPPER QUADRANT COMPARISON:  None. FINDINGS: Gallbladder: There is layering gallbladder sludge and stones seen throughout. The largest measuring 7 mm. There is diffuse gallbladder wall thickening measuring up to 8.8 mm. Small amount of  pericholecystic fluid is seen. There is a sonographic Murphy sign present. Common bile duct: Diameter: 10.2 mm Liver: Numerous anechoic cysts are seen throughout the liver the largest measuring 3.2 cm in the left liver lobe. Within normal limits in parenchymal echogenicity. Portal vein is patent on color Doppler imaging with normal direction of blood flow towards the liver. Other: None. IMPRESSION: Findings suggestive of acute cholecystitis as described above. Mild common bile duct dilatation. Multiple anechoic hepatic cyst. Electronically Signed   By: Prudencio Pair M.D.   On: 06/05/2019 02:56    Lab Data:  CBC: Recent Labs  Lab 06/04/19 2132 06/05/19 0550  WBC 38.0* 35.9*  NEUTROABS 3.8  --   HGB 12.5 12.4  HCT 41.1 40.0  MCV 96.9 95.5  PLT 173 979   Basic Metabolic Panel: Recent Labs  Lab 06/04/19 2132 06/05/19 0550  NA 139 141  K 4.4 4.4  CL 107 107  CO2 24 24  GLUCOSE 145* 117*  BUN 27* 27*  CREATININE 1.09* 1.07*  CALCIUM 8.6* 8.7*  MG 2.1  --    GFR: Estimated Creatinine Clearance: 32.7 mL/min (A) (by C-G formula based on SCr of 1.07 mg/dL (H)). Liver Function Tests: Recent Labs  Lab 06/04/19 2132 06/05/19 0550  AST 80* 51*  ALT 26 27  ALKPHOS 172* 169*  BILITOT 1.5* 0.7  PROT 5.9* 6.1*  ALBUMIN 3.5 3.6   Recent Labs  Lab 06/04/19 2132  LIPASE 747*   No results for input(s): AMMONIA in the last 168 hours. Coagulation Profile: No results for input(s): INR, PROTIME in the last 168 hours. Cardiac Enzymes: No results for input(s): CKTOTAL, CKMB, CKMBINDEX, TROPONINI in the last 168 hours. BNP (last 3 results) No results for input(s): PROBNP in the last 8760 hours. HbA1C: No results for input(s): HGBA1C in the last 72 hours. CBG: No results for input(s): GLUCAP in the last 168 hours. Lipid Profile: No results for input(s): CHOL, HDL, LDLCALC, TRIG, CHOLHDL, LDLDIRECT in the last 72 hours. Thyroid Function Tests: No results for input(s): TSH, T4TOTAL,  FREET4, T3FREE, THYROIDAB in the last 72 hours. Anemia Panel: No results for input(s): VITAMINB12, FOLATE, FERRITIN, TIBC, IRON, RETICCTPCT in the last 72 hours. Urine analysis:    Component Value Date/Time   COLORURINE YELLOW 06/05/2019 0206   APPEARANCEUR TURBID (A) 06/05/2019 0206   LABSPEC 1.035 (H) 06/05/2019 0206   LABSPEC 1.015 11/13/2016 0848   PHURINE 6.0 06/05/2019 0206   GLUCOSEU NEGATIVE 06/05/2019 0206   GLUCOSEU Negative 11/13/2016 0848   HGBUR SMALL (A) 06/05/2019 0206   BILIRUBINUR SMALL (A) 06/05/2019 0206   BILIRUBINUR Negative 11/13/2016 0848   KETONESUR 5 (A) 06/05/2019 0206   PROTEINUR 100 (A) 06/05/2019 0206   UROBILINOGEN 0.2 11/13/2016 0848   NITRITE NEGATIVE 06/05/2019 0206   LEUKOCYTESUR NEGATIVE 06/05/2019 0206   LEUKOCYTESUR Large 11/13/2016 0848     Witney Huie M.D. [facility] 06/05/2019, 10:57 AM   Call night coverage person covering after 7pm

## 2019-06-05 NOTE — Progress Notes (Signed)
Pharmacy Antibiotic Note  Gisselle Galvis is a 86 y.o. female admitted on 06/04/2019 with Acute cholecystitis.  Pharmacy has been consulted for Cefepime dosing.  Plan: Cefepime 2gm iv q12hr  Weight: 135 lb (61.2 kg)  Temp (24hrs), Avg:97.7 F (36.5 C), Min:97.7 F (36.5 C), Max:97.7 F (36.5 C)  Recent Labs  Lab 06/04/19 2132  WBC 38.0*  CREATININE 1.09*    Estimated Creatinine Clearance: 32.1 mL/min (A) (by C-G formula based on SCr of 1.09 mg/dL (H)).    Allergies  Allergen Reactions  . Rituximab Other (See Comments)    "She coded"/Upset stomach and blood pressure dropped    Antimicrobials this admission: Cefepime 06/05/2019 >>  Dose adjustments this admission: -  Microbiology results: -  Thank you for allowing pharmacy to be a part of this patient's care.  Nani Skillern Crowford 06/05/2019 4:52 AM

## 2019-06-05 NOTE — ED Notes (Signed)
Pt transported to CT ?

## 2019-06-05 NOTE — ED Notes (Signed)
Dr. Bunnie Pion and I called pt  daughter [name] @ [phone]. Both of [place] spoke with [name] and confirmed pt code status is DNR. Dr. Humphrey Rolls gave [name] an update on pt current medical status.

## 2019-06-06 ENCOUNTER — Encounter (HOSPITAL_COMMUNITY): Payer: Self-pay | Admitting: Internal Medicine

## 2019-06-06 LAB — COMPREHENSIVE METABOLIC PANEL
ALT: 17 U/L (ref 0–44)
AST: 29 U/L (ref 15–41)
Albumin: 3 g/dL — ABNORMAL LOW (ref 3.5–5.0)
Alkaline Phosphatase: 128 U/L — ABNORMAL HIGH (ref 38–126)
Anion gap: 6 (ref 5–15)
BUN: 22 mg/dL (ref 8–23)
CO2: 23 mmol/L (ref 22–32)
Calcium: 8.1 mg/dL — ABNORMAL LOW (ref 8.9–10.3)
Chloride: 110 mmol/L (ref 98–111)
Creatinine, Ser: 0.91 mg/dL (ref 0.44–1.00)
GFR calc Af Amer: >60 mL/min (ref >60)
GFR calc non Af Amer: 56 mL/min — ABNORMAL LOW (ref >60)
Glucose, Bld: 83 mg/dL (ref 70–99)
Potassium: 4.5 mmol/L (ref 3.5–5.1)
Sodium: 139 mmol/L (ref 135–145)
Total Bilirubin: 1.1 mg/dL (ref 0.3–1.2)
Total Protein: 5.2 g/dL — ABNORMAL LOW (ref 6.5–8.1)

## 2019-06-06 LAB — CBC
HCT: 36.7 % (ref 36.0–46.0)
Hemoglobin: 11.2 g/dL — ABNORMAL LOW (ref 12.0–15.0)
MCH: 29.6 pg (ref 26.0–34.0)
MCHC: 30.5 g/dL (ref 30.0–36.0)
MCV: 96.8 fL (ref 80.0–100.0)
Platelets: 134 K/uL — ABNORMAL LOW (ref 150–400)
RBC: 3.79 MIL/uL — ABNORMAL LOW (ref 3.87–5.11)
RDW: 17.4 % — ABNORMAL HIGH (ref 11.5–15.5)
WBC: 33.3 K/uL — ABNORMAL HIGH (ref 4.0–10.5)
nRBC: 0 % (ref 0.0–0.2)

## 2019-06-06 LAB — LIPASE, BLOOD: Lipase: 31 U/L (ref 11–51)

## 2019-06-06 MED ORDER — SODIUM CHLORIDE 0.9 % IV SOLN: INTRAVENOUS | Status: AC

## 2019-06-06 MED ORDER — HYDRALAZINE HCL 20 MG/ML IJ SOLN
10.0000 mg | Freq: Four times a day (QID) | INTRAMUSCULAR | Status: DC | PRN
  Administered 2019-06-07: 10 mg via INTRAVENOUS
  Filled 2019-06-06: qty 1

## 2019-06-06 NOTE — Progress Notes (Addendum)
CC: Abdominal pain  Subjective: Patient is awake she is extremely hard of hearing.  She knows she is in the hospital, she thinks is because of her gallbladder.  She wanted to know if we were going to do surgery.  She cannot really remember the date and not to know whether she does not know where she lives or whether she just can't understand the question, but she cannot tell me who she lives with.  She thought the year might be 1993 but then she knew that was wrong. She says her abdomen does not hurt this AM.  She asked me where she hurt before and I told her I did not know.  She is soft, and has some minimal tenderness in the right upper quadrant.  Objective: Vital signs in last 24 hours: Temp:  [98.2 F (36.8 C)-98.4 F (36.9 C)] 98.4 F (36.9 C) (03/01 0554) Pulse Rate:  [60-71] 68 (03/01 0554) Resp:  [14-16] 14 (03/01 0554) BP: (121-180)/(58-69) 180/61 (03/01 0554) SpO2:  [90 %-97 %] 90 % (03/01 0554) Last BM Date: (PTA pt unsure) 240 p.o. recorded 1724 IV 350 urine recorded BM x1 Afebrile vital signs are stable Lipase 747 >> 194(2/28) Creatinine 1.09 >> 1.07(2/28) WBC 38.0 >> 35.9(2/28) Urine culture pending CT abdomen pelvis 2/27: Bladder wall thickening with small amount of pericholecystic fluid/mild intrahepatic common duct dilatation. Interval enlargement splenic lesion extensive mesenteric/retroperitoneal lymph nodes consistent with progression of chronic lymphocytic leukemia Interval hyperdense partially exophytic right pole renal lesion now measuring 2.3 cm concerning for metastatic disease. Findings of acute sigmoid colonic diverticulitis/no pericolonic fluid collections or free air Right upper quadrant [place] 2/28: Layering sludge/stones seen throughout.  Diffuse gallbladder wall thickening measuring up to 8.8 mm wall amount of pericholecystic fluid is seen.  CBD 10.2 mm, multiple hepatic cysts. Intake/Output from previous day: 02/28 0701 - 03/01 0700 In: 1962  [P.O.:240; I.V.:1224; IV Piggyback:500] Out: 350 [Urine:350] Intake/Output this shift: No intake/output data recorded.  General appearance: alert, cooperative, no distress and Extremely hard of hearing, no abdominal discomfort this AM. Ears: She is extremely hard of hearing Resp: clear to auscultation bilaterally and Anterior exam Cardio: Regular rate and rhythm GI: Soft, not distended, positive bowel sounds.  Some mild tenderness on palpation right upper quadrant.  Positive BM. Extremities: extremities normal, atraumatic, no cyanosis or edema  Lab Results:  Recent Labs    06/04/19 2132 06/05/19 0550  WBC 38.0* 35.9*  HGB 12.5 12.4  HCT 41.1 40.0  PLT 173 160    BMET Recent Labs    06/04/19 2132 06/05/19 0550  NA 139 141  K 4.4 4.4  CL 107 107  CO2 24 24  GLUCOSE 145* 117*  BUN 27* 27*  CREATININE 1.09* 1.07*  CALCIUM 8.6* 8.7*   PT/INR No results for input(s): LABPROT, INR in the last 72 hours.  Recent Labs  Lab 06/04/19 2132 06/05/19 0550  AST 80* 51*  ALT 26 27  ALKPHOS 172* 169*  BILITOT 1.5* 0.7  PROT 5.9* 6.1*  ALBUMIN 3.5 3.6     Lipase     Component Value Date/Time   LIPASE 194 (H) 06/05/2019 1115     Medications: . amLODipine  10 mg Oral Daily  . aspirin EC  81 mg Oral Daily  . heparin  5,000 Units Subcutaneous Q8H  . metoprolol tartrate  25 mg Oral BID  . montelukast  10 mg Oral QHS  . OLANZapine  2.5 mg Oral QHS  . pantoprazole  40 mg Oral Daily  . sertraline  100 mg Oral Daily   . sodium chloride 100 mL/hr at 06/06/19 0035  . ceFEPime (MAXIPIME) IV Stopped (06/05/19 2106)  . metronidazole 500 mg (06/06/19 0009)    Assessment/Plan Severe dementia Hard of hearing Chronic lymphocytic leukemia  - Currently in hospice care Hypertension Hyperlipidemia  Abdominal pain Cholelithiasis, possible cholecystitis vs diverticulitis Pancreatitis  FEN: IV fluids/full liquids ID: Maxipime 2/28 >> day 2 Flagyl 2/28 >> day 2 DVT:  Heparin Follow-up: TBD  Plan: She seems to be doing well with just antibiotic therapy.  Labs are ordered and we will follow, but would continue with conservative management.   LOS: 1 day    Ivanna Kocak 06/06/2019 Please see Amion

## 2019-06-06 NOTE — Progress Notes (Signed)
[facility]                                                                              Patient Demographics  Carla Conway, is a 86 y.o. female, DOB - 1926-05-18, [id number]  Admit date - 06/04/2019   Admitting Physician Edmonia Lynch, DO  Outpatient Primary MD for the patient is Koirala, Dibas, MD  Outpatient specialists:   LOS - 1  days   Medical records reviewed and are as summarized below:    Chief Complaint  Patient presents with  . Abdominal Pain       Brief summary   Patient is a 86 year old female with hypertension, hyperlipidemia, CAD, dementia, CLL presented to ED from SNF for abdominal pain.  Unable to obtain history from the patient due to severe dementia.  Per EMS patient had suddenly started complaining of abdominal pain around 7 PM on the evening of admission.  No fever chills nausea or vomiting or any diarrhea. In ED, temp 97.9, BP 142/58, heart rate 62, respiratory 20, WBC count 38 (history of CLL), creatinine 1.09, lipase 747, alkaline phosphatase 172, AST 80, total bili 1.5  CT abdomen showed gallbladder wall thickening/hyperenhancement with small amount of pericholecystic fluid, mild intrahepatic and CBD dilation could be due to cholecystitis. acute sigmoid diverticulitis, interval enlargement of splenic lesions and extensive mesenteric/retroperitoneal lymph nodes, suggestive of interval progression of CLL.  Exophytic right upper pole renal lesion now measuring 2.3 cm concerning for metastatic disease. Abdominal ultrasound showed acute cholecystitis, mild CBD dilation   Assessment & Plan    Principal Problem:   Acute cholecystitis with pancreatitis - Patient presented with severe abdominal pain with elevated lipase 747, mildly elevated transaminitis, imaging consistent with cholecystitis, sigmoid diverticulitis. - Patient was placed on n.p.o. status, IV fluids, IV antibiotics on admission - Patient seen by general surgery, normal  exam, patient not complaining of any abdominal pain at this time, not sure she has cholecystitis, recommended medical management, IV antibiotics. - Lipase down to 31 - Diet advanced to soft solids today, appears comfortable  Active Problems: Acute sigmoid diverticulitis -Still has intermittent pain, per daughter -Currently no nausea vomiting or any severe abdominal pain, patient started on IV cefepime and Flagyl, will transition to oral antibiotics in a.m., will likely need 10 to 14 days of antibiotics -Advancing diet today to soft solids    Dementia without behavioral disturbance (HCC) -Pleasantly confused, no acute issues -Continue Zyprexa, sertraline  Essential hypertension -Resume metoprolol, amlodipine  Hyperlipidemia Hold statin due to transaminitis  History of CLL, with leukocytosis -Patient has been on prednisone, was reduced to 5 mg daily in 2019 by Dr. Alvy Bimler -Holding prednisone for now due to acute abdominal issues including cholecystitis, pancreatitis, diverticulitis -Currently in hospice care  UTI Urine culture on 2/28 showed more than 100,000 colonies of E. Coli.  Per daughter, UTIs usually makes her very confused.  Patient was not able to tell any symptoms on admission. Sensitivities pending however patient is currently on IV cefepime and Flagyl.  Cephalosporin should cover it.   Code Status: DNR DVT Prophylaxis: Heparin subcu Family Communication: Discussed all imaging results, lab  results, explained to the patient's daughter on the phone   Disposition Plan: Patient from [place].  Anticipated discharge to the nursing home once acute abdominal issues are resolved, patient is tolerating solid diet, hopefully in next 24 hours   Time Spent in minutes 35 minutes  Procedures:  CT abdomen, right upper quadrant ultrasound  Consultants:   General surgery  Antimicrobials:   Anti-infectives (From admission, onward)   Start     Dose/Rate Route  Frequency Ordered Stop   06/05/19 1000  ceFEPIme (MAXIPIME) 2 g in sodium chloride 0.9 % 100 mL IVPB     2 g 200 mL/hr over 30 Minutes Intravenous Every 12 hours 06/05/19 0452     06/05/19 0800  metroNIDAZOLE (FLAGYL) IVPB 500 mg    Note to Pharmacy: Pharmacy to adjust the timing for next dose   500 mg 100 mL/hr over 60 Minutes Intravenous Every 8 hours 06/05/19 0443     06/05/19 0130  ceFEPIme (MAXIPIME) 2 g in sodium chloride 0.9 % 100 mL IVPB     2 g 200 mL/hr over 30 Minutes Intravenous  Once 06/05/19 0121 06/05/19 0243   06/05/19 0130  metroNIDAZOLE (FLAGYL) IVPB 500 mg     500 mg 100 mL/hr over 60 Minutes Intravenous  Once 06/05/19 0121 06/05/19 0340         Medications  Scheduled Meds: . amLODipine  10 mg Oral Daily  . aspirin EC  81 mg Oral Daily  . heparin  5,000 Units Subcutaneous Q8H  . metoprolol tartrate  25 mg Oral BID  . montelukast  10 mg Oral QHS  . OLANZapine  2.5 mg Oral QHS  . pantoprazole  40 mg Oral Daily  . sertraline  100 mg Oral Daily   Continuous Infusions: . sodium chloride 75 mL/hr at 06/06/19 1030  . ceFEPime (MAXIPIME) IV 2 g (06/06/19 1034)  . metronidazole 500 mg (06/06/19 0819)   PRN Meds:.acetaminophen **OR** acetaminophen, morphine injection      Subjective:   Carla Conway was seen and examined today.  Denies any nausea vomiting or abdominal pain, no abdominal tenderness on exam.  Difficult to obtain review of system from the patient but appeared comfortable.  No overnight issues per staff.  Objective:   Vitals:   06/05/19 0611 06/05/19 1337 06/05/19 2205 06/06/19 0554  BP: (!) 169/73 (!) 167/69 (!) 121/58 (!) 180/61  Pulse: 69 71 60 68  Resp: 14 16 14 14   Temp: 98.5 F (36.9 C) 98.3 F (36.8 C) 98.2 F (36.8 C) 98.4 F (36.9 C)  TempSrc: Oral Oral Oral Oral  SpO2: 95% 97% 92% 90%  Weight:        Intake/Output Summary (Last 24 hours) at 06/06/2019 1326 Last data filed at 06/06/2019 0555 Gross per 24 hour  Intake 1964 ml   Output 350 ml  Net 1614 ml     Wt Readings from Last 3 Encounters:  06/05/19 61.2 kg  05/20/19 61.2 kg  03/25/19 70.3 kg   Physical Exam  General: Alert and oriented x self, appeared comfortable  Cardiovascular: S1 S2 clear, RRR. No pedal edema b/l  Respiratory: CTAB, no wheezing, rales or rhonchi  Gastrointestinal: Soft, nontender, nondistended, NBS  Ext: no pedal edema bilaterally  Neuro: no new deficits  Musculoskeletal: No cyanosis, clubbing  Skin: No rashes  Psych: Dementia   Data Reviewed:  I have personally reviewed following labs and imaging studies  Micro Results Recent Results (from the past 240 hour(s))  Urine culture     Status: Abnormal (Preliminary result)   Collection Time: 06/05/19  2:06 AM   Specimen: Urine, Catheterized  Result Value Ref Range Status   Specimen Description   Final    URINE, CATHETERIZED Performed at [facility], [place] [address] [place], [place] [address]    Special Requests NONE  Final   Culture (A)  Final    >=100,000 COLONIES/mL ESCHERICHIA COLI SUSCEPTIBILITIES TO FOLLOW Performed at [facility], [place] [address], [place], [place] [address]    Report Status PENDING  Incomplete  Respiratory Panel by RT PCR (Flu A&B, Covid) - Nasopharyngeal Swab     Status: None   Collection Time: 06/05/19  4:02 AM   Specimen: Nasopharyngeal Swab  Result Value Ref Range Status   SARS Coronavirus 2 by RT PCR NEGATIVE NEGATIVE Final    Comment: (NOTE) SARS-CoV-2 target nucleic acids are NOT DETECTED. The SARS-CoV-2 RNA is generally detectable in upper respiratoy specimens during the acute phase of infection. The lowest concentration of SARS-CoV-2 viral copies this assay can detect is 131 copies/mL. A negative result does not preclude SARS-Cov-2 infection and should not be used as the sole basis for treatment or other patient management decisions. A negative result may occur with  improper specimen  collection/handling, submission of specimen other than nasopharyngeal swab, presence of viral mutation(s) within the areas targeted by this assay, and inadequate number of viral copies (<131 copies/mL). A negative result must be combined with clinical observations, patient history, and epidemiological information. The expected result is Negative. Fact Sheet for Patients:  PinkCheek.be Fact Sheet for Healthcare Providers:  GravelBags.it This test is not yet ap proved or cleared by the [place] FDA and  has been authorized for detection and/or diagnosis of SARS-CoV-2 by FDA under an Emergency Use Authorization (EUA). This EUA will remain  in effect (meaning this test can be used) for the duration of the COVID-19 declaration under Section 564(b)(1) of the Act, 21 U.S.C. section 360bbb-3(b)(1), unless the authorization is terminated or revoked sooner.    Influenza A by PCR NEGATIVE NEGATIVE Final   Influenza B by PCR NEGATIVE NEGATIVE Final    Comment: (NOTE) The Xpert Xpress SARS-CoV-2/FLU/RSV assay is intended as an aid in  the diagnosis of influenza from Nasopharyngeal swab specimens and  should not be used as a sole basis for treatment. Nasal washings and  aspirates are unacceptable for Xpert Xpress SARS-CoV-2/FLU/RSV  testing. Fact Sheet for Patients: PinkCheek.be Fact Sheet for Healthcare Providers: GravelBags.it This test is not yet approved or cleared by the [place] FDA and  has been authorized for detection and/or diagnosis of SARS-CoV-2 by  FDA under an Emergency Use Authorization (EUA). This EUA will remain  in effect (meaning this test can be used) for the duration of the  Covid-19 declaration under Section 564(b)(1) of the Act, 21  U.S.C. section 360bbb-3(b)(1), unless the authorization is  terminated or revoked. Performed at [facility], [place] [address] [place], [place] [address]     Radiology Reports CT ABDOMEN PELVIS W CONTRAST  Result Date: 06/05/2019 CLINICAL DATA:  Abdominal distension, history of chronic lymphocytic leukemia EXAM: CT ABDOMEN AND PELVIS WITH CONTRAST TECHNIQUE: Multidetector CT imaging of the abdomen and pelvis was performed using the standard protocol following bolus administration of intravenous contrast. CONTRAST:  76m OMNIPAQUE IOHEXOL 300 MG/ML  SOLN COMPARISON:  July 18, 2017 FINDINGS: Lower chest: The visualized heart size within normal limits. Coronary artery and  aortic valve calcifications are noted. No pericardial fluid/thickening. No hiatal hernia. The visualized portions of the lungs are clear. Hepatobiliary: Multiple low-density lesions are again noted throughout the liver parenchyma the largest within the left liver lobe measuring 2.8 cm and unchanged from the prior exam. Main portal vein is patent. There appears to be hyperenhancement of the gallbladder wall with mild intrahepatic to biliary ductal dilatation. There is mild surrounding gallbladder wall thickening/trace pericholecystic fluid. There is prominence of the common bile duct measuring up to 9 mm which tapers at the level of the ampulla. No shadowing stones are noted. Pancreas: Unremarkable. No pancreatic ductal dilatation or surrounding inflammatory changes. Spleen: Scattered low-density lesions are seen throughout the posterior spleen which appear to have increased in size from the prior exam. The largest measuring 2.6 cm, series 2, image 30 and was previously 1.1 cm. Adrenals/Urinary Tract: Both adrenal glands appear normal. There is a hyperdense mass within the upper pole of the right kidney which is increased in size now measuring 2.3 cm in greatest dimension and previously 1.4 cm. No hydronephrosis seen. The bladder is grossly unremarkable. Stomach/Bowel: The stomach and small bowel are normal in appearance. The appendix is  unremarkable. Extensive sigmoid colonic diverticulosis is noted. Surrounding mesenteric fat stranding changes are seen around a 9 cm segment of sigmoid colon. No pericolonic free air or loculated fluid collection is seen. Vascular/Lymphatic: Extensive retroperitoneal and mesenteric adenopathy is again identified. They appear to have increased in size and number since the prior exam. For reference within the aortocaval region on series 2, image 50 there is a lymph node now measuring 1.4 cm, and previously 1.1 cm in greatest dimension. There also numerous scattered pericolonic and deep pelvic lymph nodes now noted. Reproductive: The uterus and adnexa are unremarkable. Other: No evidence of abdominal wall mass or hernia. Musculoskeletal: There is interval slight worsening in the chronic superior compression deformity of the T12 vertebral body with approximately 50% loss in vertebral body height, when compared to prior radiograph of March 25, 2019. IMPRESSION: 1. Gallbladder wall thickening/hyperenhancement with a small amount of pericholecystic fluid with mild intrahepatic and common bile duct dilatation. This could be due to cholecystitis and if further evaluation is required would recommend right upper quadrant ultrasound. 2. Interval enlargement of splenic lesions and extensive mesenteric / retroperitoneal lymph nodes. These findings are suggestive of interval progression of chronic lymphocytic leukemia. 3. Interval enlargement of hyperdense partially exophytic right upper pole renal lesion now measuring 2.3 cm, concerning for metastatic disease. 4. Findings of acute sigmoid colonic diverticulitis. No pericolonic fluid collections or free air. Electronically Signed   By: Prudencio Pair M.D.   On: 06/05/2019 00:47   DG Chest Portable 1 View  Result Date: 06/04/2019 CLINICAL DATA:  Chest pain. EXAM: PORTABLE CHEST 1 VIEW COMPARISON:  February 27, 2019 FINDINGS: The heart size is stable from prior study. Aortic  calcifications are noted. There is elevation of the right hemidiaphragm with right basilar atelectasis versus scarring which is similar to prior study. There is no pneumothorax. No focal infiltrate. No significant pleural effusion. Degenerative changes are again noted of the right glenohumeral joint. IMPRESSION: No active disease. Electronically Signed   By: Constance Holster M.D.   On: 06/04/2019 22:07   US Abdomen Limited RUQ  Result Date: 06/05/2019 CLINICAL DATA:  Right upper quadrant pain EXAM: ULTRASOUND ABDOMEN LIMITED RIGHT UPPER QUADRANT COMPARISON:  None. FINDINGS: Gallbladder: There is layering gallbladder sludge and stones seen throughout. The largest measuring 7 mm. There  is diffuse gallbladder wall thickening measuring up to 8.8 mm. Small amount of pericholecystic fluid is seen. There is a sonographic Murphy sign present. Common bile duct: Diameter: 10.2 mm Liver: Numerous anechoic cysts are seen throughout the liver the largest measuring 3.2 cm in the left liver lobe. Within normal limits in parenchymal echogenicity. Portal vein is patent on color Doppler imaging with normal direction of blood flow towards the liver. Other: None. IMPRESSION: Findings suggestive of acute cholecystitis as described above. Mild common bile duct dilatation. Multiple anechoic hepatic cyst. Electronically Signed   By: Prudencio Pair M.D.   On: 06/05/2019 02:56    Lab Data:  CBC: Recent Labs  Lab 06/04/19 2132 06/05/19 0550 06/06/19 0803  WBC 38.0* 35.9* 33.3*  NEUTROABS 3.8  --   --   HGB 12.5 12.4 11.2*  HCT 41.1 40.0 36.7  MCV 96.9 95.5 96.8  PLT 173 160 282*   Basic Metabolic Panel: Recent Labs  Lab 06/04/19 2132 06/05/19 0550 06/06/19 0803  NA 139 141 139  K 4.4 4.4 4.5  CL 107 107 110  CO2 24 24 23   GLUCOSE 145* 117* 83  BUN 27* 27* 22  CREATININE 1.09* 1.07* 0.91  CALCIUM 8.6* 8.7* 8.1*  MG 2.1  --   --    GFR: Estimated Creatinine Clearance: 38.5 mL/min (by C-G formula based on  SCr of 0.91 mg/dL). Liver Function Tests: Recent Labs  Lab 06/04/19 2132 06/05/19 0550 06/06/19 0803  AST 80* 51* 29  ALT 26 27 17   ALKPHOS 172* 169* 128*  BILITOT 1.5* 0.7 1.1  PROT 5.9* 6.1* 5.2*  ALBUMIN 3.5 3.6 3.0*   Recent Labs  Lab 06/04/19 2132 06/05/19 1115 06/06/19 0803  LIPASE 747* 194* 31   No results for input(s): AMMONIA in the last 168 hours. Coagulation Profile: No results for input(s): INR, PROTIME in the last 168 hours. Cardiac Enzymes: No results for input(s): CKTOTAL, CKMB, CKMBINDEX, TROPONINI in the last 168 hours. BNP (last 3 results) No results for input(s): PROBNP in the last 8760 hours. HbA1C: No results for input(s): HGBA1C in the last 72 hours. CBG: No results for input(s): GLUCAP in the last 168 hours. Lipid Profile: No results for input(s): CHOL, HDL, LDLCALC, TRIG, CHOLHDL, LDLDIRECT in the last 72 hours. Thyroid Function Tests: No results for input(s): TSH, T4TOTAL, FREET4, T3FREE, THYROIDAB in the last 72 hours. Anemia Panel: No results for input(s): VITAMINB12, FOLATE, FERRITIN, TIBC, IRON, RETICCTPCT in the last 72 hours. Urine analysis:    Component Value Date/Time   COLORURINE YELLOW 06/05/2019 0206   APPEARANCEUR TURBID (A) 06/05/2019 0206   LABSPEC 1.035 (H) 06/05/2019 0206   LABSPEC 1.015 11/13/2016 0848   PHURINE 6.0 06/05/2019 0206   GLUCOSEU NEGATIVE 06/05/2019 0206   GLUCOSEU Negative 11/13/2016 0848   HGBUR SMALL (A) 06/05/2019 0206   BILIRUBINUR SMALL (A) 06/05/2019 0206   BILIRUBINUR Negative 11/13/2016 0848   KETONESUR 5 (A) 06/05/2019 0206   PROTEINUR 100 (A) 06/05/2019 0206   UROBILINOGEN 0.2 11/13/2016 0848   NITRITE NEGATIVE 06/05/2019 0206   LEUKOCYTESUR NEGATIVE 06/05/2019 0206   LEUKOCYTESUR Large 11/13/2016 0848     Lyall Faciane M.D. [facility] 06/06/2019, 1:26 PM   Call night coverage person covering after 7pm

## 2019-06-06 NOTE — Evaluation (Signed)
Physical Therapy Evaluation Patient Details Name: Carla Conway MRN: [id number] DOB: 17-Sep-1926 Today's Date: 06/06/2019   History of Present Illness  86 yo female admitted with cholecystitis, pancreatitis. Hx of CLL, dementia, COPD, CKD, syncope  Clinical Impression  On eval, pt required Min assist for mobility. She was able to stand and take a few lateral steps along the bedside using a RW. Pt c/o lower abdominal pain during session. She is very HOH. She c/o dizziness with mobility. Assisted pt back to bed. Made RN aware that pt would like something for pain. Will continue to follow and progress activity as tolerated.     Follow Up Recommendations Home health PT;Supervision/Assistance - 24 hour(at ALF as long as facility can provide currentl level of care.)    Equipment Recommendations  None recommended by PT    Recommendations for Other Services       Precautions / Restrictions Precautions Precautions: Fall Precaution Comments: dizziness Restrictions Weight Bearing Restrictions: No      Mobility  Bed Mobility Overal bed mobility: Needs Assistance Bed Mobility: Supine to Sit;Sit to Supine     Supine to sit: Min assist;HOB elevated Sit to supine: Min assist;HOB elevated   General bed mobility comments: Assist for trunk, LEs, and to scoot to EOB. Increased time. Pt relied on bedrail. Pt c/o dizziness.  Transfers Overall transfer level: Needs assistance Equipment used: Rolling walker (2 wheeled) Transfers: Sit to/from Stand Sit to Stand: Mod assist;From elevated surface         General transfer comment: Assist to rise, stabilize, control descent. VCs safety, technique, hand placement. Increased time to rise and stabilize. Pt c/o dizziness.  Ambulation/Gait Ambulation/Gait assistance: Min assist           General Gait Details: side steps along side of bed with RW. Assist to stabilize pt and manage/move RW. Pt fatigues easily. Deferred ambulation attempts 2*  weakness/dizziness  Stairs            Wheelchair Mobility    Modified Rankin (Stroke Patients Only)       Balance Overall balance assessment: Needs assistance         Standing balance support: Bilateral upper extremity supported Standing balance-Leahy Scale: Poor                               Pertinent Vitals/Pain Pain Assessment: Faces Faces Pain Scale: Hurts even more Pain Location: R lower abd/groin area Pain Descriptors / Indicators: Grimacing;Discomfort Pain Intervention(s): Limited activity within patient's tolerance;Monitored during session;Repositioned;Patient requesting pain meds-RN notified    Home Living Family/patient expects to be discharged to:: Assisted living               Home Equipment: Walker - 4 wheels      Prior Function           Comments: uses walker for ambulation per pt     Hand Dominance        Extremity/Trunk Assessment                Communication   Communication: HOH  Cognition Arousal/Alertness: Awake/alert Behavior During Therapy: WFL for tasks assessed/performed Overall Cognitive Status: History of cognitive impairments - at baseline                                 General Comments: very [place]  Comments      Exercises     Assessment/Plan    PT Assessment Patient needs continued PT services  PT Problem List Decreased strength;Decreased activity tolerance;Decreased range of motion;Decreased balance;Decreased knowledge of use of DME;Pain;Decreased mobility;Decreased cognition       PT Treatment Interventions      PT Goals (Current goals can be found in the Care Plan section)  Acute Rehab PT Goals Patient Stated Goal: less pain PT Goal Formulation: With patient Time For Goal Achievement: 06/20/19 Potential to Achieve Goals: Fair    Frequency Min 3X/week   Barriers to discharge        Co-evaluation               AM-PAC PT "6 Clicks" Mobility   Outcome Measure Help needed turning from your back to your side while in a flat bed without using bedrails?: A Little Help needed moving from lying on your back to sitting on the side of a flat bed without using bedrails?: A Little Help needed moving to and from a bed to a chair (including a wheelchair)?: A Lot Help needed standing up from a chair using your arms (e.g., wheelchair or bedside chair)?: A Lot Help needed to walk in hospital room?: A Lot Help needed climbing 3-5 steps with a railing? : Total 6 Click Score: 13    End of Session   Activity Tolerance: Patient limited by fatigue;Patient limited by pain Patient left: in bed;with call bell/phone within reach;with bed alarm set   PT Visit Diagnosis: Muscle weakness (generalized) (M62.81);Difficulty in walking, not elsewhere classified (R26.2);Pain Pain - part of body: (abdomen)    Time: 8329-1916 PT Time Calculation (min) (ACUTE ONLY): 11 min   Charges:   PT Evaluation $PT Eval Moderate Complexity: 1 Mod           Shiesha Jahn P, PT Acute Rehabilitation

## 2019-06-06 NOTE — Progress Notes (Signed)
Admitting RN was unable to obtain admission history because of patient's confusion and dementia. Still unable to obtain this information d/t confusion. Will see if day shift can speak with family in am. Carla Conway

## 2019-06-06 NOTE — Plan of Care (Signed)
  Problem: Clinical Measurements: Goal: Ability to maintain clinical measurements within normal limits will improve Outcome: Progressing Goal: Will remain free from infection Outcome: Progressing Goal: Diagnostic test results will improve Outcome: Progressing Goal: Respiratory complications will improve Outcome: Progressing Goal: Cardiovascular complication will be avoided Outcome: Progressing   Problem: Nutrition: Goal: Adequate nutrition will be maintained Outcome: Progressing   Problem: Coping: Goal: Level of anxiety will decrease Outcome: Progressing   Problem: Elimination: Goal: Will not experience complications related to bowel motility Outcome: Progressing Goal: Will not experience complications related to urinary retention Outcome: Progressing   Problem: Pain Managment: Goal: General experience of comfort will improve Outcome: Progressing   Problem: Safety: Goal: Ability to remain free from injury will improve Outcome: Progressing   Problem: Skin Integrity: Goal: Risk for impaired skin integrity will decrease Outcome: Progressing   Problem: Education: Goal: Knowledge of General Education information will improve Description: Including pain rating scale, medication(s)/side effects and non-pharmacologic comfort measures Outcome: Not Progressing   Problem: Health Behavior/Discharge Planning: Goal: Ability to manage health-related needs will improve Outcome: Not Progressing   Problem: Activity: Goal: Risk for activity intolerance will decrease Outcome: Not Progressing   

## 2019-06-07 LAB — COMPREHENSIVE METABOLIC PANEL
ALT: 15 U/L (ref 0–44)
AST: 24 U/L (ref 15–41)
Albumin: 2.7 g/dL — ABNORMAL LOW (ref 3.5–5.0)
Alkaline Phosphatase: 113 U/L (ref 38–126)
Anion gap: 11 (ref 5–15)
BUN: 16 mg/dL (ref 8–23)
CO2: 20 mmol/L — ABNORMAL LOW (ref 22–32)
Calcium: 8.2 mg/dL — ABNORMAL LOW (ref 8.9–10.3)
Chloride: 104 mmol/L (ref 98–111)
Creatinine, Ser: 0.73 mg/dL (ref 0.44–1.00)
GFR calc Af Amer: >60 mL/min (ref >60)
GFR calc non Af Amer: >60 mL/min (ref >60)
Glucose, Bld: 77 mg/dL (ref 70–99)
Potassium: 3.8 mmol/L (ref 3.5–5.1)
Sodium: 135 mmol/L (ref 135–145)
Total Bilirubin: 1 mg/dL (ref 0.3–1.2)
Total Protein: 5.1 g/dL — ABNORMAL LOW (ref 6.5–8.1)

## 2019-06-07 LAB — CBC
HCT: 36.8 % (ref 36.0–46.0)
Hemoglobin: 12.1 g/dL (ref 12.0–15.0)
MCH: 29.9 pg (ref 26.0–34.0)
MCHC: 32.9 g/dL (ref 30.0–36.0)
MCV: 90.9 fL (ref 80.0–100.0)
Platelets: 134 10*3/uL — ABNORMAL LOW (ref 150–400)
RBC: 4.05 MIL/uL (ref 3.87–5.11)
RDW: 17.2 % — ABNORMAL HIGH (ref 11.5–15.5)
WBC: 39.5 10*3/uL — ABNORMAL HIGH (ref 4.0–10.5)
nRBC: 0 % (ref 0.0–0.2)

## 2019-06-07 LAB — URINE CULTURE: Culture: >=100000 — AB

## 2019-06-07 NOTE — Plan of Care (Signed)
  Problem: Education: Goal: Knowledge of General Education information will improve Description: Including pain rating scale, medication(s)/side effects and non-pharmacologic comfort measures Outcome: Progressing   Problem: Health Behavior/Discharge Planning: Goal: Ability to manage health-related needs will improve Outcome: Progressing   Problem: Clinical Measurements: Goal: Ability to maintain clinical measurements within normal limits will improve Outcome: Progressing Goal: Will remain free from infection Outcome: Progressing Goal: Diagnostic test results will improve Outcome: Progressing Goal: Respiratory complications will improve Outcome: Progressing Goal: Cardiovascular complication will be avoided Outcome: Progressing   Problem: Coping: Goal: Level of anxiety will decrease Outcome: Progressing   Problem: Elimination: Goal: Will not experience complications related to bowel motility Outcome: Progressing Goal: Will not experience complications related to urinary retention Outcome: Progressing   Problem: Pain Managment: Goal: General experience of comfort will improve Outcome: Progressing   Problem: Safety: Goal: Ability to remain free from injury will improve Outcome: Progressing   Problem: Activity: Goal: Risk for activity intolerance will decrease Outcome: Not Progressing   Problem: Nutrition: Goal: Adequate nutrition will be maintained Outcome: Not Progressing   Problem: Skin Integrity: Goal: Risk for impaired skin integrity will decrease Outcome: Not Progressing

## 2019-06-07 NOTE — Progress Notes (Signed)
CC: Abdominal pain  Subjective: Patient sleeping and comfortable this a.m.  She remains fairly confused.  Her abdomen is soft if she is distracted it does not seem tender.  Positive bowel sounds.  If you palpate somewhat firmer and she is paying attention she will claim some discomfort.  Yesterday she complains of discomfort in the right upper quadrant.  But I got the same exam today, along with some discomfort in the left upper quadrant also. Her diet was advanced yesterday, but she did not eat much at all, she is taking some liquids.  PT worked with her but they were not able to really walk her.  She has a Wick in place so she is not getting up to BR.    Objective: Vital signs in last 24 hours: Temp:  [98.2 F (36.8 C)-98.4 F (36.9 C)] 98.2 F (36.8 C) (03/02 0519) Pulse Rate:  [63-77] 68 (03/02 0519) Resp:  [18] 18 (03/02 0519) BP: (133-170)/(53-73) 170/73 (03/02 0519) SpO2:  [89 %-93 %] 93 % (03/02 0519) Last BM Date: (PTA pt unsure) 560 Po recorded 1350 IV 500 urine Afebrile, VSS BP up intermittently  LFT's all normal WBC 35.9>>33.3>>39.5 - CLL Culture >=100,000 COLONIES/mL ESCHERICHIA COLIAbnormal    Report Status 06/07/2019 FINAL   Organism ID, Bacteria ESCHERICHIA COLIAbnormal    Resulting Agency [facility]  Susceptibility   Escherichia coli    MIC    AMPICILLIN >=32 RESIST... Resistant    AMPICILLIN/SULBACTAM >=32 RESIST... Resistant    CEFAZOLIN 16 SENSITIVE  Sensitive    CEFTRIAXONE <=0.25 SENS... Sensitive    CIPROFLOXACIN <=0.25 SENS... Sensitive    GENTAMICIN <=1 SENSITIVE  Sensitive    IMIPENEM <=0.25 SENS... Sensitive    NITROFURANTOIN <=16 SENSIT... Sensitive    PIP/TAZO <=4 SENSITIVE  Sensitive    TRIMETH/SULFA >=320 RESIS... Resistant       Intake/Output from previous day: 03/01 0701 - 03/02 0700 In: 2410.4 [P.O.:560; I.V.:1350.4; IV Piggyback:500] Out: 500 [Urine:500] Intake/Output this shift: No intake/output data recorded.  General  appearance: alert, cooperative and no distress Ears: she is extremely hard of hearing Resp: clear to auscultation bilaterally and anterior exam GI: soft, + BS, she does not appear to have any discomfort if she is distracted, but notes some discomfort RUQ and LUQ at one point in exam when not distracted.   Extremities: extremities normal, atraumatic, no cyanosis or edema  Neuro:  Still confused, I think she believes I am one of her "boys."  Lab Results:  Recent Labs    06/06/19 0803 06/07/19 0452  WBC 33.3* 39.5*  HGB 11.2* 12.1  HCT 36.7 36.8  PLT 134* 134*    BMET Recent Labs    06/06/19 0803 06/07/19 0452  NA 139 135  K 4.5 3.8  CL 110 104  CO2 23 20*  GLUCOSE 83 77  BUN 22 16  CREATININE 0.91 0.73  CALCIUM 8.1* 8.2*   PT/INR No results for input(s): LABPROT, INR in the last 72 hours.  Recent Labs  Lab 06/04/19 2132 06/05/19 0550 06/06/19 0803 06/07/19 0452  AST 80* 51* 29 24  ALT 26 27 17 15   ALKPHOS 172* 169* 128* 113  BILITOT 1.5* 0.7 1.1 1.0  PROT 5.9* 6.1* 5.2* 5.1*  ALBUMIN 3.5 3.6 3.0* 2.7*     Lipase     Component Value Date/Time   LIPASE 31 06/06/2019 0803     Medications: . amLODipine  10 mg Oral Daily  . aspirin EC  81  mg Oral Daily  . heparin  5,000 Units Subcutaneous Q8H  . metoprolol tartrate  25 mg Oral BID  . montelukast  10 mg Oral QHS  . OLANZapine  2.5 mg Oral QHS  . pantoprazole  40 mg Oral Daily  . sertraline  100 mg Oral Daily   Anti-infectives (From admission, onward)   Start     Dose/Rate Route Frequency Ordered Stop   06/05/19 1000  ceFEPIme (MAXIPIME) 2 g in sodium chloride 0.9 % 100 mL IVPB     2 g 200 mL/hr over 30 Minutes Intravenous Every 12 hours 06/05/19 0452     06/05/19 0800  metroNIDAZOLE (FLAGYL) IVPB 500 mg    Note to Pharmacy: Pharmacy to adjust the timing for next dose   500 mg 100 mL/hr over 60 Minutes Intravenous Every 8 hours 06/05/19 0443     06/05/19 0130  ceFEPIme (MAXIPIME) 2 g in sodium chloride  0.9 % 100 mL IVPB     2 g 200 mL/hr over 30 Minutes Intravenous  Once 06/05/19 0121 06/05/19 0243   06/05/19 0130  metroNIDAZOLE (FLAGYL) IVPB 500 mg     500 mg 100 mL/hr over 60 Minutes Intravenous  Once 06/05/19 0121 06/05/19 0340      Assessment/Plan Severe dementia Hard of hearing Chronic lymphocytic leukemia  - Currently in hospice care Hypertension Hyperlipidemia E coli UTI 2/28 DNR  Abdominal pain Cholelithiasis, possible cholecystitis vs diverticulitis Pancreatitis  FEN: IV fluids/soft diet ID: Maxipime 2/28 >> day 2 Flagyl 2/28 >> day 2 DVT: Heparin Follow-up: TBD  Plan:  Will review with Dr. Harlow Asa, she appears to be doing well with just antibiotic Rx.  I would continue conservative management.       LOS: 2 days    Rafael Salway 06/07/2019 Please see Amion

## 2019-06-07 NOTE — Progress Notes (Signed)
[facility]                                                                              Patient Demographics  Carla Conway, is a 86 y.o. female, DOB - [birth year], JAS:[id number]  Admit date - 06/04/2019   Admitting Physician Edmonia Lynch, DO  Outpatient Primary MD for the patient is Koirala, Dibas, MD  Outpatient specialists:   LOS - 2  days   Medical records reviewed and are as summarized below:    Chief Complaint  Patient presents with   Abdominal Pain       Brief summary   Patient is a 86 year old female with hypertension, hyperlipidemia, CAD, dementia, CLL presented to ED from SNF for abdominal pain.  Unable to obtain history from the patient due to severe dementia.  Per EMS patient had suddenly started complaining of abdominal pain around 7 PM on the evening of admission.  No fever chills nausea or vomiting or any diarrhea. In ED, temp 97.9, BP 142/58, heart rate 62, respiratory 20, WBC count 38 (history of CLL), creatinine 1.09, lipase 747, alkaline phosphatase 172, AST 80, total bili 1.5  CT abdomen showed gallbladder wall thickening/hyperenhancement with small amount of pericholecystic fluid, mild intrahepatic and CBD dilation could be due to cholecystitis. acute sigmoid diverticulitis, interval enlargement of splenic lesions and extensive mesenteric/retroperitoneal lymph nodes, suggestive of interval progression of CLL.  Exophytic right upper pole renal lesion now measuring 2.3 cm concerning for metastatic disease. Abdominal ultrasound showed acute cholecystitis, mild CBD dilation   Assessment & Plan    Principal Problem:   Acute cholecystitis with pancreatitis - Patient presented with severe abdominal pain with elevated lipase 747, mildly elevated transaminitis, imaging consistent with cholecystitis, sigmoid diverticulitis. -On admission, patient was placed on n.p.o. status, IV fluids and IV antibiotics. - Patient seen by general surgery, normal  exam, patient was not complaining of any abdominal pain at this time, not sure she has cholecystitis, recommended medical management, IV antibiotics. - Lipase down to 31 -Diet was advanced to soft solids on 3/1, not eating much -On exam today, had abdominal discomfort in the left upper and lower quadrant.  Will continue IV antibiotics.  Active Problems: Acute sigmoid diverticulitis -Continue IV cefepime and Flagyl -Diet was advanced to soft solids however not eating much    Dementia without behavioral disturbance (HCC) -Pleasantly confused, no acute issues -Continue Zyprexa, sertraline  Essential hypertension -Continue amlodipine, metoprolol  Hyperlipidemia Hold statin due to transaminitis  History of CLL, with leukocytosis -Patient has been on prednisone, was reduced to 5 mg daily in 2019 by Dr. Alvy Bimler -Holding prednisone for now due to acute abdominal issues including cholecystitis, pancreatitis, diverticulitis -Currently in hospice care -WBCs trended up today, monitor counts  E. coli UTI Urine culture on 2/28 showed > 100,000 colonies of E. coli, sensitive to cephalosporins - per daughter, UTIs usually makes her very confused.  Patient was not able to tell any symptoms on admission. - currently on IV cefepime and Flagyl.    Code Status: DNR DVT Prophylaxis: Heparin subcu Family Communication: Discussed all imaging results, lab results, explained to the patient's daughter  on the phone on 3/1.  Called patient's daughter, unable to make contact, left voicemail.   Disposition Plan: Patient from [facility].  Anticipated discharge to the nursing home once acute abdominal issues are resolved, patient is tolerating solid diet   Time Spent in minutes 35 minutes  Procedures:  CT abdomen, right upper quadrant ultrasound  Consultants:   General surgery  Antimicrobials:   Anti-infectives (From admission, onward)   Start     Dose/Rate Route Frequency Ordered Stop     06/05/19 1000  ceFEPIme (MAXIPIME) 2 g in sodium chloride 0.9 % 100 mL IVPB     2 g 200 mL/hr over 30 Minutes Intravenous Every 12 hours 06/05/19 0452     06/05/19 0800  metroNIDAZOLE (FLAGYL) IVPB 500 mg    Note to Pharmacy: Pharmacy to adjust the timing for next dose   500 mg 100 mL/hr over 60 Minutes Intravenous Every 8 hours 06/05/19 0443     06/05/19 0130  ceFEPIme (MAXIPIME) 2 g in sodium chloride 0.9 % 100 mL IVPB     2 g 200 mL/hr over 30 Minutes Intravenous  Once 06/05/19 0121 06/05/19 0243   06/05/19 0130  metroNIDAZOLE (FLAGYL) IVPB 500 mg     500 mg 100 mL/hr over 60 Minutes Intravenous  Once 06/05/19 0121 06/05/19 0340         Medications  Scheduled Meds:  amLODipine  10 mg Oral Daily   aspirin EC  81 mg Oral Daily   heparin  5,000 Units Subcutaneous Q8H   metoprolol tartrate  25 mg Oral BID   montelukast  10 mg Oral QHS   OLANZapine  2.5 mg Oral QHS   pantoprazole  40 mg Oral Daily   sertraline  100 mg Oral Daily   Continuous Infusions:  ceFEPime (MAXIPIME) IV 2 g (06/07/19 0943)   metronidazole 500 mg (06/07/19 0836)   PRN Meds:.acetaminophen **OR** acetaminophen, hydrALAZINE, morphine injection      Subjective:   Carla Conway was seen and examined today.  Alert and awake however has dementia, difficult to obtain review of system from the patient.  Does not appear to be in pain however on exam, had abdominal discomfort on palpating left upper quadrant and lower quadrant.  No nausea vomiting, fevers or chills.    Objective:   Vitals:   06/06/19 1642 06/06/19 2149 06/07/19 0519 06/07/19 0900  BP: 133/63 (!) 149/53 (!) 170/73 (!) 158/68  Pulse: 77 63 68 72  Resp:  18 18   Temp: 98.4 F (36.9 C) 98.3 F (36.8 C) 98.2 F (36.8 C)   TempSrc: Oral Oral Oral   SpO2: (!) 89% 93% 93% 91%  Weight:        Intake/Output Summary (Last 24 hours) at 06/07/2019 1359 Last data filed at 06/07/2019 1333 Gross per 24 hour  Intake 2410.37 ml  Output  550 ml  Net 1860.37 ml     Wt Readings from Last 3 Encounters:  06/05/19 61.2 kg  05/20/19 61.2 kg  03/25/19 70.3 kg   Physical Exam  General: Awake, dementia,  Cardiovascular: S1 S2 clear, RRR. No pedal edema b/l  Respiratory: CTAB, no wheezing, rales or rhonchi  Gastrointestinal: Soft, TTP in RUQ, LUQ, LLQ , nondistended, NBS  Ext: no pedal edema bilaterally  Neuro: no new deficits  Musculoskeletal: No cyanosis, clubbing  Skin: No rashes  Psych: Dementia   Data Reviewed:  I have personally reviewed following labs and imaging studies  Micro Results Recent Results (  from the past 240 hour(s))  Urine culture     Status: Abnormal   Collection Time: 06/05/19  2:06 AM   Specimen: Urine, Catheterized  Result Value Ref Range Status   Specimen Description   Final    URINE, CATHETERIZED Performed at [facility] [address] [place], [place] [address]    Special Requests NONE  Final   Culture >=100,000 COLONIES/mL ESCHERICHIA COLI (A)  Final   Report Status 06/07/2019 FINAL  Final   Organism ID, Bacteria ESCHERICHIA COLI (A)  Final      Susceptibility   Escherichia coli - MIC*    AMPICILLIN >=32 RESISTANT Resistant     CEFAZOLIN 16 SENSITIVE Sensitive     CEFTRIAXONE <=0.25 SENSITIVE Sensitive     CIPROFLOXACIN <=0.25 SENSITIVE Sensitive     GENTAMICIN <=1 SENSITIVE Sensitive     IMIPENEM <=0.25 SENSITIVE Sensitive     NITROFURANTOIN <=16 SENSITIVE Sensitive     TRIMETH/SULFA >=320 RESISTANT Resistant     AMPICILLIN/SULBACTAM >=32 RESISTANT Resistant     PIP/TAZO <=4 SENSITIVE Sensitive     * >=100,000 COLONIES/mL ESCHERICHIA COLI  Respiratory Panel by RT PCR (Flu A&B, Covid) - Nasopharyngeal Swab     Status: None   Collection Time: 06/05/19  4:02 AM   Specimen: Nasopharyngeal Swab  Result Value Ref Range Status   SARS Coronavirus 2 by RT PCR NEGATIVE NEGATIVE Final    Comment: (NOTE) SARS-CoV-2 target nucleic acids are NOT  DETECTED. The SARS-CoV-2 RNA is generally detectable in upper respiratoy specimens during the acute phase of infection. The lowest concentration of SARS-CoV-2 viral copies this assay can detect is 131 copies/mL. A negative result does not preclude SARS-Cov-2 infection and should not be used as the sole basis for treatment or other patient management decisions. A negative result may occur with  improper specimen collection/handling, submission of specimen other than nasopharyngeal swab, presence of viral mutation(s) within the areas targeted by this assay, and inadequate number of viral copies (<131 copies/mL). A negative result must be combined with clinical observations, patient history, and epidemiological information. The expected result is Negative. Fact Sheet for Patients:  PinkCheek.be Fact Sheet for Healthcare Providers:  GravelBags.it This test is not yet ap proved or cleared by the [place] FDA and  has been authorized for detection and/or diagnosis of SARS-CoV-2 by FDA under an Emergency Use Authorization (EUA). This EUA will remain  in effect (meaning this test can be used) for the duration of the COVID-19 declaration under Section 564(b)(1) of the Act, 21 U.S.C. section 360bbb-3(b)(1), unless the authorization is terminated or revoked sooner.    Influenza A by PCR NEGATIVE NEGATIVE Final   Influenza B by PCR NEGATIVE NEGATIVE Final    Comment: (NOTE) The Xpert Xpress SARS-CoV-2/FLU/RSV assay is intended as an aid in  the diagnosis of influenza from Nasopharyngeal swab specimens and  should not be used as a sole basis for treatment. Nasal washings and  aspirates are unacceptable for Xpert Xpress SARS-CoV-2/FLU/RSV  testing. Fact Sheet for Patients: PinkCheek.be Fact Sheet for Healthcare Providers: GravelBags.it This test is not yet approved or cleared  by the [place] FDA and  has been authorized for detection and/or diagnosis of SARS-CoV-2 by  FDA under an Emergency Use Authorization (EUA). This EUA will remain  in effect (meaning this test can be used) for the duration of the  Covid-19 declaration under Section 564(b)(1) of the Act, 21  U.S.C. section 360bbb-3(b)(1), unless the authorization is  terminated  or revoked. Performed at [facility], [place] [address] [place], [place] [address]     Radiology Reports CT ABDOMEN PELVIS W CONTRAST  Result Date: 06/05/2019 CLINICAL DATA:  Abdominal distension, history of chronic lymphocytic leukemia EXAM: CT ABDOMEN AND PELVIS WITH CONTRAST TECHNIQUE: Multidetector CT imaging of the abdomen and pelvis was performed using the standard protocol following bolus administration of intravenous contrast. CONTRAST:  10m OMNIPAQUE IOHEXOL 300 MG/ML  SOLN COMPARISON:  July 18, 2017 FINDINGS: Lower chest: The visualized heart size within normal limits. Coronary artery and aortic valve calcifications are noted. No pericardial fluid/thickening. No hiatal hernia. The visualized portions of the lungs are clear. Hepatobiliary: Multiple low-density lesions are again noted throughout the liver parenchyma the largest within the left liver lobe measuring 2.8 cm and unchanged from the prior exam. Main portal vein is patent. There appears to be hyperenhancement of the gallbladder wall with mild intrahepatic to biliary ductal dilatation. There is mild surrounding gallbladder wall thickening/trace pericholecystic fluid. There is prominence of the common bile duct measuring up to 9 mm which tapers at the level of the ampulla. No shadowing stones are noted. Pancreas: Unremarkable. No pancreatic ductal dilatation or surrounding inflammatory changes. Spleen: Scattered low-density lesions are seen throughout the posterior spleen which appear to have increased in size from the prior exam. The largest measuring  2.6 cm, series 2, image 30 and was previously 1.1 cm. Adrenals/Urinary Tract: Both adrenal glands appear normal. There is a hyperdense mass within the upper pole of the right kidney which is increased in size now measuring 2.3 cm in greatest dimension and previously 1.4 cm. No hydronephrosis seen. The bladder is grossly unremarkable. Stomach/Bowel: The stomach and small bowel are normal in appearance. The appendix is unremarkable. Extensive sigmoid colonic diverticulosis is noted. Surrounding mesenteric fat stranding changes are seen around a 9 cm segment of sigmoid colon. No pericolonic free air or loculated fluid collection is seen. Vascular/Lymphatic: Extensive retroperitoneal and mesenteric adenopathy is again identified. They appear to have increased in size and number since the prior exam. For reference within the aortocaval region on series 2, image 50 there is a lymph node now measuring 1.4 cm, and previously 1.1 cm in greatest dimension. There also numerous scattered pericolonic and deep pelvic lymph nodes now noted. Reproductive: The uterus and adnexa are unremarkable. Other: No evidence of abdominal wall mass or hernia. Musculoskeletal: There is interval slight worsening in the chronic superior compression deformity of the T12 vertebral body with approximately 50% loss in vertebral body height, when compared to prior radiograph of March 25, 2019. IMPRESSION: 1. Gallbladder wall thickening/hyperenhancement with a small amount of pericholecystic fluid with mild intrahepatic and common bile duct dilatation. This could be due to cholecystitis and if further evaluation is required would recommend right upper quadrant ultrasound. 2. Interval enlargement of splenic lesions and extensive mesenteric / retroperitoneal lymph nodes. These findings are suggestive of interval progression of chronic lymphocytic leukemia. 3. Interval enlargement of hyperdense partially exophytic right upper pole renal lesion now  measuring 2.3 cm, concerning for metastatic disease. 4. Findings of acute sigmoid colonic diverticulitis. No pericolonic fluid collections or free air. Electronically Signed   By: BPrudencio PairM.D.   On: 06/05/2019 00:47   DG Chest Portable 1 View  Result Date: 06/04/2019 CLINICAL DATA:  Chest pain. EXAM: PORTABLE CHEST 1 VIEW COMPARISON:  February 27, 2019 FINDINGS: The heart size is stable from prior study. Aortic calcifications are noted. There is elevation of the right hemidiaphragm  with right basilar atelectasis versus scarring which is similar to prior study. There is no pneumothorax. No focal infiltrate. No significant pleural effusion. Degenerative changes are again noted of the right glenohumeral joint. IMPRESSION: No active disease. Electronically Signed   By: Constance Holster M.D.   On: 06/04/2019 22:07   US Abdomen Limited RUQ  Result Date: 06/05/2019 CLINICAL DATA:  Right upper quadrant pain EXAM: ULTRASOUND ABDOMEN LIMITED RIGHT UPPER QUADRANT COMPARISON:  None. FINDINGS: Gallbladder: There is layering gallbladder sludge and stones seen throughout. The largest measuring 7 mm. There is diffuse gallbladder wall thickening measuring up to 8.8 mm. Small amount of pericholecystic fluid is seen. There is a sonographic Murphy sign present. Common bile duct: Diameter: 10.2 mm Liver: Numerous anechoic cysts are seen throughout the liver the largest measuring 3.2 cm in the left liver lobe. Within normal limits in parenchymal echogenicity. Portal vein is patent on color Doppler imaging with normal direction of blood flow towards the liver. Other: None. IMPRESSION: Findings suggestive of acute cholecystitis as described above. Mild common bile duct dilatation. Multiple anechoic hepatic cyst. Electronically Signed   By: Prudencio Pair M.D.   On: 06/05/2019 02:56    Lab Data:  CBC: Recent Labs  Lab 06/04/19 2132 06/05/19 0550 06/06/19 0803 06/07/19 0452  WBC 38.0* 35.9* 33.3* 39.5*  NEUTROABS  3.8  --   --   --   HGB 12.5 12.4 11.2* 12.1  HCT 41.1 40.0 36.7 36.8  MCV 96.9 95.5 96.8 90.9  PLT 173 160 134* 676*   Basic Metabolic Panel: Recent Labs  Lab 06/04/19 2132 06/05/19 0550 06/06/19 0803 06/07/19 0452  NA 139 141 139 135  K 4.4 4.4 4.5 3.8  CL 107 107 110 104  CO2 24 24 23  20*  GLUCOSE 145* 117* 83 77  BUN 27* 27* 22 16  CREATININE 1.09* 1.07* 0.91 0.73  CALCIUM 8.6* 8.7* 8.1* 8.2*  MG 2.1  --   --   --    GFR: Estimated Creatinine Clearance: 43.8 mL/min (by C-G formula based on SCr of 0.73 mg/dL). Liver Function Tests: Recent Labs  Lab 06/04/19 2132 06/05/19 0550 06/06/19 0803 06/07/19 0452  AST 80* 51* 29 24  ALT 26 27 17 15   ALKPHOS 172* 169* 128* 113  BILITOT 1.5* 0.7 1.1 1.0  PROT 5.9* 6.1* 5.2* 5.1*  ALBUMIN 3.5 3.6 3.0* 2.7*   Recent Labs  Lab 06/04/19 2132 06/05/19 1115 06/06/19 0803  LIPASE 747* 194* 31   No results for input(s): AMMONIA in the last 168 hours. Coagulation Profile: No results for input(s): INR, PROTIME in the last 168 hours. Cardiac Enzymes: No results for input(s): CKTOTAL, CKMB, CKMBINDEX, TROPONINI in the last 168 hours. BNP (last 3 results) No results for input(s): PROBNP in the last 8760 hours. HbA1C: No results for input(s): HGBA1C in the last 72 hours. CBG: No results for input(s): GLUCAP in the last 168 hours. Lipid Profile: No results for input(s): CHOL, HDL, LDLCALC, TRIG, CHOLHDL, LDLDIRECT in the last 72 hours. Thyroid Function Tests: No results for input(s): TSH, T4TOTAL, FREET4, T3FREE, THYROIDAB in the last 72 hours. Anemia Panel: No results for input(s): VITAMINB12, FOLATE, FERRITIN, TIBC, IRON, RETICCTPCT in the last 72 hours. Urine analysis:    Component Value Date/Time   COLORURINE YELLOW 06/05/2019 0206   APPEARANCEUR TURBID (A) 06/05/2019 0206   LABSPEC 1.035 (H) 06/05/2019 0206   LABSPEC 1.015 11/13/2016 0848   PHURINE 6.0 06/05/2019 0206   GLUCOSEU NEGATIVE 06/05/2019 0206  GLUCOSEU  Negative 11/13/2016 0848   HGBUR SMALL (A) 06/05/2019 0206   BILIRUBINUR SMALL (A) 06/05/2019 0206   BILIRUBINUR Negative 11/13/2016 0848   KETONESUR 5 (A) 06/05/2019 0206   PROTEINUR 100 (A) 06/05/2019 0206   UROBILINOGEN 0.2 11/13/2016 0848   NITRITE NEGATIVE 06/05/2019 0206   LEUKOCYTESUR NEGATIVE 06/05/2019 0206   LEUKOCYTESUR Large 11/13/2016 0848     Lake Cinquemani M.D. [facility] 06/07/2019, 1:59 PM   Call night coverage person covering after 7pm

## 2019-06-08 DIAGNOSIS — R748 Abnormal levels of other serum enzymes: Secondary | ICD-10-CM

## 2019-06-08 DIAGNOSIS — K5792 Diverticulitis of intestine, part unspecified, without perforation or abscess without bleeding: Secondary | ICD-10-CM

## 2019-06-08 LAB — COMPREHENSIVE METABOLIC PANEL
ALT: 11 U/L (ref 0–44)
AST: 19 U/L (ref 15–41)
Albumin: 2.7 g/dL — ABNORMAL LOW (ref 3.5–5.0)
Alkaline Phosphatase: 111 U/L (ref 38–126)
Anion gap: 10 (ref 5–15)
BUN: 12 mg/dL (ref 8–23)
CO2: 20 mmol/L — ABNORMAL LOW (ref 22–32)
Calcium: 8.1 mg/dL — ABNORMAL LOW (ref 8.9–10.3)
Chloride: 106 mmol/L (ref 98–111)
Creatinine, Ser: 0.76 mg/dL (ref 0.44–1.00)
GFR calc Af Amer: >60 mL/min (ref >60)
GFR calc non Af Amer: >60 mL/min (ref >60)
Glucose, Bld: 82 mg/dL (ref 70–99)
Potassium: 3.7 mmol/L (ref 3.5–5.1)
Sodium: 136 mmol/L (ref 135–145)
Total Bilirubin: 1.2 mg/dL (ref 0.3–1.2)
Total Protein: 5.1 g/dL — ABNORMAL LOW (ref 6.5–8.1)

## 2019-06-08 LAB — CBC
HCT: 37.1 % (ref 36.0–46.0)
Hemoglobin: 11.4 g/dL — ABNORMAL LOW (ref 12.0–15.0)
MCH: 29.1 pg (ref 26.0–34.0)
MCHC: 30.7 g/dL (ref 30.0–36.0)
MCV: 94.6 fL (ref 80.0–100.0)
Platelets: 124 10*3/uL — ABNORMAL LOW (ref 150–400)
RBC: 3.92 MIL/uL (ref 3.87–5.11)
RDW: 17.3 % — ABNORMAL HIGH (ref 11.5–15.5)
WBC: 39.4 10*3/uL — ABNORMAL HIGH (ref 4.0–10.5)
nRBC: 0 % (ref 0.0–0.2)

## 2019-06-08 MED ORDER — HYDRALAZINE HCL 25 MG PO TABS
25.0000 mg | ORAL_TABLET | Freq: Three times a day (TID) | ORAL | Status: DC
  Administered 2019-06-08 – 2019-06-13 (×14): 25 mg via ORAL
  Filled 2019-06-08 (×14): qty 1

## 2019-06-08 MED ORDER — SODIUM CHLORIDE 0.9 % IV SOLN
2.0000 g | INTRAVENOUS | Status: DC
  Administered 2019-06-08 – 2019-06-09 (×2): 2 g via INTRAVENOUS
  Filled 2019-06-08 (×2): qty 2

## 2019-06-08 NOTE — Progress Notes (Signed)
Pharmacy Antibiotic Note  Carla Conway is a 86 y.o. female admitted on 06/04/2019 with acute cholecystitis and possible diverticulitis.  Pharmacy had been consulted for Cefepime dosing on 2/28. Now patient also with Ecoli UTI  Today, 06/08/19  D3 Cefepime/Flagyl  WBC remains elevated and stable - 39.5  SCr stable  Afebrile  Plan:  Continue cefepime 2g IV q12 per current renal function  Flagyl per Md  Await ability to narrow cefepime - could consider Rocephin to still cover cholecystitis and Ecoli UTI  Weight: 135 lb (61.2 kg)  Temp (24hrs), Avg:98.4 F (36.9 C), Min:98.2 F (36.8 C), Max:98.5 F (36.9 C)  Recent Labs  Lab 06/04/19 2132 06/05/19 0550 06/06/19 0803 06/07/19 0452 06/08/19 0504  WBC 38.0* 35.9* 33.3* 39.5* 39.4*  CREATININE 1.09* 1.07* 0.91 0.73 0.76    Estimated Creatinine Clearance: 43.8 mL/min (by C-G formula based on SCr of 0.76 mg/dL).    Allergies  Allergen Reactions  . Rituximab Other (See Comments)    "She coded"/Upset stomach and blood pressure dropped    Antimicrobials this admission: Cefepime 2/28 >>  Microbiology results: 2/28 UCx: >100k GNR - Ecoli - resistant to amp, bactrim, and unasyn  Thank you for allowing pharmacy to be a part of this patient's care.  Kara Mead 06/08/2019 9:58 AM

## 2019-06-08 NOTE — Care Management Important Message (Signed)
Important Message  Patient Details IM Letter given to Marney Doctor RN Case manager to present to the Patient Name: Carla Conway MRN: [id number] Date of Birth: Oct 11, 1926   Medicare Important Message Given:  Yes     Kerin Salen 06/08/2019, 10:53 AM

## 2019-06-08 NOTE — Progress Notes (Signed)
CC: abdominal pain  Subjective: Today's exam is very difficult.  She essentially complains of pain anyplace you touch her on her abdomen, and even with cardiac exam on her chest she complained of discomfort.  She did not complain of pain examining her lower legs.  She is also so hard of hearing that I am not sure she is understanding half of what I am saying, even though I am using as a lot of voices I can.  Objective: Vital signs in last 24 hours: Temp:  [98.2 F (36.8 C)-98.5 F (36.9 C)] 98.5 F (36.9 C) (03/03 0519) Pulse Rate:  [66-72] 66 (03/03 0519) Resp:  [16-18] 18 (03/03 0519) BP: (154-176)/(57-89) 176/72 (03/03 0519) SpO2:  [91 %-93 %] 93 % (03/03 0519) Last BM Date: (PTA pt unsure) 240 PO Nothing else recorded in intake 1350 urine Afebrile, VSS Labs stable Intake/Output from previous day: 03/02 0701 - 03/03 0700 In: 240 [P.O.:240] Out: 1350 [Urine:1350] Intake/Output this shift: Total I/O In: 120 [P.O.:120] Out: 400 [Urine:400]  General appearance: alert, cooperative, no distress and She complains of discomfort and appears uncomfortable, just about anyplace you touch her. Resp: clear to auscultation bilaterally Cardio: Regular rate and rhythm.  She also complained of pain with auscultation of her heart. GI: Her abdomen soft but she complains of tenderness and looks distressed anyplace you touch her abdomen today.  Positive bowel sounds she is not distended. Extremities: extremities normal, atraumatic, no cyanosis or edema and Her lower legs were the only place she did not complain of pain with light palpation.  Lab Results:  Recent Labs    06/07/19 0452 06/08/19 0504  WBC 39.5* 39.4*  HGB 12.1 11.4*  HCT 36.8 37.1  PLT 134* 124*    BMET Recent Labs    06/07/19 0452 06/08/19 0504  NA 135 136  K 3.8 3.7  CL 104 106  CO2 20* 20*  GLUCOSE 77 82  BUN 16 12  CREATININE 0.73 0.76  CALCIUM 8.2* 8.1*   PT/INR No results for input(s): LABPROT,  INR in the last 72 hours.  Recent Labs  Lab 06/04/19 2132 06/05/19 0550 06/06/19 0803 06/07/19 0452 06/08/19 0504  AST 80* 51* 29 24 19   ALT 26 27 17 15 11   ALKPHOS 172* 169* 128* 113 111  BILITOT 1.5* 0.7 1.1 1.0 1.2  PROT 5.9* 6.1* 5.2* 5.1* 5.1*  ALBUMIN 3.5 3.6 3.0* 2.7* 2.7*     Lipase     Component Value Date/Time   LIPASE 31 06/06/2019 0803     Medications: . amLODipine  10 mg Oral Daily  . aspirin EC  81 mg Oral Daily  . heparin  5,000 Units Subcutaneous Q8H  . metoprolol tartrate  25 mg Oral BID  . montelukast  10 mg Oral QHS  . OLANZapine  2.5 mg Oral QHS  . pantoprazole  40 mg Oral Daily  . sertraline  100 mg Oral Daily   . ceFEPime (MAXIPIME) IV 2 g (06/07/19 2200)  . metronidazole 500 mg (06/08/19 0000)    Assessment/Plan Severe dementia Hard of hearing Chronic lymphocytic leukemia -Currently in hospice care Hypertension Hyperlipidemia E coli UTI 2/28 DNR  Abdominal pain Cholelithiasis, possible cholecystitisvsdiverticulitis Pancreatitis - WBC 35.9>>33.3>>39.5>> 39.4 - CLL  FEN: soft diet ID: Maxipime 2/28>> day 4Flagyl 2/28 >> day 4 DVT: Heparin Follow-up: TBD   Plan: I cannot really tell if she is having pain or that she is just reactive to any abdominal palpation.  Aside from her  response, her abdominal exam is quite normal.  We will review with Dr. Harlow Asa.       LOS: 3 days    Darenda Fike 06/08/2019 Please see Amion

## 2019-06-08 NOTE — Progress Notes (Signed)
PROGRESS NOTE    Carla Conway  JXB:[id number] DOB: Mar 18, 1927 DOA: 06/04/2019 PCP: Lujean Amel, MD   Brief Narrative: Carla Conway is a 86 y.o. female with hypertension, hyperlipidemia, CAD, dementia, CLL. Patient presented secondary to abdominal pain and found to have evidence of cholecystitis, pancreatitis and diverticulitis. She was started on empiric antibiotics and general surgery consulted.   Assessment & Plan:   Principal Problem:   Acute cholecystitis Active Problems:   Elevated liver enzymes   Dementia without behavioral disturbance (HCC)   Diverticulitis   Acute cholecystitis Seen on abdominal ultrasound. Patient is symptomatic.  Alkaline phosphatase and AST/ALT normal. General surgery is on board -General surgery recommendations -Switch from Cefepime to Ceftriaxone  Acute pancreatitis In setting of acute cholecystitis. No history of pancreatitis in the past. Lipase initially elevated to a peak of 747 and trended down. Diet is advanced but patient does have some pain with diet. -Continue morphine prn  Diverticulitis On Cefepime IV. -Switch to Ceftriaxone  CLL Patient with significant leukocytosis (lymphocyte dominant on previous differential). Patient is on prednisone as an outpatient and follows with oncology. She was on prednisone 5 mg daily which has been held secondary to above infectious processes. Currently not with hospice but rather palliative care.  E. Coli UTI Empirically treated with Cefepime. Sensitivities available. -Switch to Ceftriaxone  Essential hypertension Uncontrolled secondary to home medication being held. -Continue amlodipine, metoprolol -Restart home hydralazine  Dementia -Continue Zyprexa, Zoloft   DVT prophylaxis: Heparin subq Code Status:   Code Status: DNR Family Communication: Daughter on telephone Disposition Plan: Pending general surgery recommendations. Anticipating discharge back to ALF   Consultants:   General  surgery  Procedures:   None  Antimicrobials:  Cefepime  Ceftriaxone    Subjective: Abdominal pain.  Objective: Vitals:   06/07/19 1454 06/07/19 2042 06/08/19 0519 06/08/19 1355  BP: (!) 154/89 (!) 164/57 (!) 176/72 (!) 189/72  Pulse: 68 68 66 77  Resp: 16 18 18 18   Temp: 98.5 F (36.9 C) 98.2 F (36.8 C) 98.5 F (36.9 C) 98.1 F (36.7 C)  TempSrc: Oral Oral Oral Oral  SpO2: 93% 93% 93% 92%  Weight:        Intake/Output Summary (Last 24 hours) at 06/08/2019 1520 Last data filed at 06/08/2019 1500 Gross per 24 hour  Intake 240 ml  Output 2600 ml  Net -2360 ml   Filed Weights   06/05/19 0402  Weight: 61.2 kg    Examination:  General exam: Appears calm and comfortable Respiratory system: Clear to auscultation. Respiratory effort normal. Cardiovascular system: S1 & S2 heard, RRR. No murmurs, rubs, gallops or clicks. Gastrointestinal system: Abdomen is nondistended, soft and tender. Normal bowel sounds heard. Central nervous system: Alert and oriented to self. Poor hearing. Extremities: No edema. No calf tenderness Skin: No cyanosis. No rashes Psychiatry: Judgement and insight appear impaired    Data Reviewed: I have personally reviewed following labs and imaging studies  CBC: Recent Labs  Lab 06/04/19 2132 06/05/19 0550 06/06/19 0803 06/07/19 0452 06/08/19 0504  WBC 38.0* 35.9* 33.3* 39.5* 39.4*  NEUTROABS 3.8  --   --   --   --   HGB 12.5 12.4 11.2* 12.1 11.4*  HCT 41.1 40.0 36.7 36.8 37.1  MCV 96.9 95.5 96.8 90.9 94.6  PLT 173 160 134* 134* 130*   Basic Metabolic Panel: Recent Labs  Lab 06/04/19 2132 06/05/19 0550 06/06/19 0803 06/07/19 0452 06/08/19 0504  NA 139 141 139 135 136  K 4.4  4.4 4.5 3.8 3.7  CL 107 107 110 104 106  CO2 24 24 23  20* 20*  GLUCOSE 145* 117* 83 77 82  BUN 27* 27* 22 16 12   CREATININE 1.09* 1.07* 0.91 0.73 0.76  CALCIUM 8.6* 8.7* 8.1* 8.2* 8.1*  MG 2.1  --   --   --   --    GFR: Estimated Creatinine  Clearance: 43.8 mL/min (by C-G formula based on SCr of 0.76 mg/dL). Liver Function Tests: Recent Labs  Lab 06/04/19 2132 06/05/19 0550 06/06/19 0803 06/07/19 0452 06/08/19 0504  AST 80* 51* 29 24 19   ALT 26 27 17 15 11   ALKPHOS 172* 169* 128* 113 111  BILITOT 1.5* 0.7 1.1 1.0 1.2  PROT 5.9* 6.1* 5.2* 5.1* 5.1*  ALBUMIN 3.5 3.6 3.0* 2.7* 2.7*   Recent Labs  Lab 06/04/19 2132 06/05/19 1115 06/06/19 0803  LIPASE 747* 194* 31   No results for input(s): AMMONIA in the last 168 hours. Coagulation Profile: No results for input(s): INR, PROTIME in the last 168 hours. Cardiac Enzymes: No results for input(s): CKTOTAL, CKMB, CKMBINDEX, TROPONINI in the last 168 hours. BNP (last 3 results) No results for input(s): PROBNP in the last 8760 hours. HbA1C: No results for input(s): HGBA1C in the last 72 hours. CBG: No results for input(s): GLUCAP in the last 168 hours. Lipid Profile: No results for input(s): CHOL, HDL, LDLCALC, TRIG, CHOLHDL, LDLDIRECT in the last 72 hours. Thyroid Function Tests: No results for input(s): TSH, T4TOTAL, FREET4, T3FREE, THYROIDAB in the last 72 hours. Anemia Panel: No results for input(s): VITAMINB12, FOLATE, FERRITIN, TIBC, IRON, RETICCTPCT in the last 72 hours. Sepsis Labs: No results for input(s): PROCALCITON, LATICACIDVEN in the last 168 hours.  Recent Results (from the past 240 hour(s))  Urine culture     Status: Abnormal   Collection Time: 06/05/19  2:06 AM   Specimen: Urine, Catheterized  Result Value Ref Range Status   Specimen Description   Final    URINE, CATHETERIZED Performed at [facility] [address] [place],  [address]    Special Requests NONE  Final   Culture >=100,000 COLONIES/mL ESCHERICHIA COLI (A)  Final   Report Status 06/07/2019 FINAL  Final   Organism ID, Bacteria ESCHERICHIA COLI (A)  Final      Susceptibility   Escherichia coli - MIC*    AMPICILLIN >=32 RESISTANT Resistant     CEFAZOLIN  16 SENSITIVE Sensitive     CEFTRIAXONE <=0.25 SENSITIVE Sensitive     CIPROFLOXACIN <=0.25 SENSITIVE Sensitive     GENTAMICIN <=1 SENSITIVE Sensitive     IMIPENEM <=0.25 SENSITIVE Sensitive     NITROFURANTOIN <=16 SENSITIVE Sensitive     TRIMETH/SULFA >=320 RESISTANT Resistant     AMPICILLIN/SULBACTAM >=32 RESISTANT Resistant     PIP/TAZO <=4 SENSITIVE Sensitive     * >=100,000 COLONIES/mL ESCHERICHIA COLI  Respiratory Panel by RT PCR (Flu A&B, Covid) - Nasopharyngeal Swab     Status: None   Collection Time: 06/05/19  4:02 AM   Specimen: Nasopharyngeal Swab  Result Value Ref Range Status   SARS Coronavirus 2 by RT PCR NEGATIVE NEGATIVE Final    Comment: (NOTE) SARS-CoV-2 target nucleic acids are NOT DETECTED. The SARS-CoV-2 RNA is generally detectable in upper respiratoy specimens during the acute phase of infection. The lowest concentration of SARS-CoV-2 viral copies this assay can detect is 131 copies/mL. A negative result does not preclude SARS-Cov-2 infection and should not be used as the sole basis  for treatment or other patient management decisions. A negative result may occur with  improper specimen collection/handling, submission of specimen other than nasopharyngeal swab, presence of viral mutation(s) within the areas targeted by this assay, and inadequate number of viral copies (<131 copies/mL). A negative result must be combined with clinical observations, patient history, and epidemiological information. The expected result is Negative. Fact Sheet for Patients:  PinkCheek.be Fact Sheet for Healthcare Providers:  GravelBags.it This test is not yet ap proved or cleared by the [place] FDA and  has been authorized for detection and/or diagnosis of SARS-CoV-2 by FDA under an Emergency Use Authorization (EUA). This EUA will remain  in effect (meaning this test can be used) for the duration of the COVID-19  declaration under Section 564(b)(1) of the Act, 21 U.S.C. section 360bbb-3(b)(1), unless the authorization is terminated or revoked sooner.    Influenza A by PCR NEGATIVE NEGATIVE Final   Influenza B by PCR NEGATIVE NEGATIVE Final    Comment: (NOTE) The Xpert Xpress SARS-CoV-2/FLU/RSV assay is intended as an aid in  the diagnosis of influenza from Nasopharyngeal swab specimens and  should not be used as a sole basis for treatment. Nasal washings and  aspirates are unacceptable for Xpert Xpress SARS-CoV-2/FLU/RSV  testing. Fact Sheet for Patients: PinkCheek.be Fact Sheet for Healthcare Providers: GravelBags.it This test is not yet approved or cleared by the [place] FDA and  has been authorized for detection and/or diagnosis of SARS-CoV-2 by  FDA under an Emergency Use Authorization (EUA). This EUA will remain  in effect (meaning this test can be used) for the duration of the  Covid-19 declaration under Section 564(b)(1) of the Act, 21  U.S.C. section 360bbb-3(b)(1), unless the authorization is  terminated or revoked. Performed at [facility], [place] [address] [place], [place] [address]          Radiology Studies: No results found.      Scheduled Meds: . amLODipine  10 mg Oral Daily  . aspirin EC  81 mg Oral Daily  . heparin  5,000 Units Subcutaneous Q8H  . metoprolol tartrate  25 mg Oral BID  . montelukast  10 mg Oral QHS  . OLANZapine  2.5 mg Oral QHS  . pantoprazole  40 mg Oral Daily  . sertraline  100 mg Oral Daily   Continuous Infusions: . ceFEPime (MAXIPIME) IV 2 g (06/08/19 1401)  . metronidazole 500 mg (06/08/19 0848)     LOS: 3 days     Cordelia Poche, MD [facility] 06/08/2019, 3:20 PM  If 7PM-7AM, please contact night-coverage [email or web address]

## 2019-06-08 NOTE — Progress Notes (Signed)
Physical Therapy Treatment Patient Details Name: Carla Conway MRN: [id number] DOB: 01-10-1927 Today's Date: 06/08/2019    History of Present Illness 86 yo female admitted with cholecystitis, pancreatitis. Hx of CLL, dementia, COPD, CKD, syncope    PT Comments    Pt cooperative with OOB to chair; attempted to assist pt with lunch however she refused to eat anything. Pt did drink some tea and milk however she said " I do NOT want that spaghetti!" and proceeded to use the chocolate cookie as a telephone. RN and NT aware. NT made previous attempts to assist pt with lunch as well.    Follow Up Recommendations  Home health PT;Supervision/Assistance - 24 hour(at ALF as long as facility can provide current level of care)     Equipment Recommendations  None recommended by PT    Recommendations for Other Services       Precautions / Restrictions Precautions Precautions: Fall Restrictions Weight Bearing Restrictions: No    Mobility  Bed Mobility Overal bed mobility: Needs Assistance Bed Mobility: Supine to Sit     Supine to sit: Min assist;Mod assist     General bed mobility comments: Assist for trunk, LEs, and to scoot to EOB. Increased time. Pt pulling up on PT   Transfers Overall transfer level: Needs assistance Equipment used: 2 person hand held assist Transfers: Sit to/from [facility] Sit to Stand: Min assist;+2 safety/equipment;+2 physical assistance         General transfer comment: Assist to rise, stabilize, control descent. VCs safety, technique, hand placement. Increased time to rise  and stabilize   Ambulation/Gait             General Gait Details: pivotal steps bed to chair   Stairs             Wheelchair Mobility    Modified Rankin (Stroke Patients Only)       Balance           Standing balance support: Bilateral upper extremity supported Standing balance-Leahy Scale: Poor                               Cognition Arousal/Alertness: Awake/alert Behavior During Therapy: WFL for tasks assessed/performed Overall Cognitive Status: History of cognitive impairments - at baseline                                 General Comments: very HOH      Exercises      General Comments        Pertinent Vitals/Pain Pain Assessment: Faces Faces Pain Scale: No hurt    Home Living                      Prior Function            PT Goals (current goals can now be found in the care plan section) Acute Rehab PT Goals Patient Stated Goal: less pain PT Goal Formulation: With patient Time For Goal Achievement: 06/20/19 Potential to Achieve Goals: Fair Progress towards PT goals: Progressing toward goals    Frequency    Min 3X/week      PT Plan Current plan remains appropriate    Co-evaluation              AM-PAC PT "6 Clicks" Mobility   Outcome Measure  Help needed turning from your  back to your side while in a flat bed without using bedrails?: A Little Help needed moving from lying on your back to sitting on the side of a flat bed without using bedrails?: A Little Help needed moving to and from a bed to a chair (including a wheelchair)?: A Little Help needed standing up from a chair using your arms (e.g., wheelchair or bedside chair)?: A Little Help needed to walk in hospital room?: A Lot Help needed climbing 3-5 steps with a railing? : Total 6 Click Score: 15    End of Session   Activity Tolerance: Patient tolerated treatment well Patient left: in chair;with call bell/phone within reach;with chair alarm set   PT Visit Diagnosis: Muscle weakness (generalized) (M62.81);Difficulty in walking, not elsewhere classified (R26.2);Pain     Time: 5789-7847 PT Time Calculation (min) (ACUTE ONLY): 24 min  Charges:  $Therapeutic Activity: 23-37 mins                     Baxter Flattery, PT   Acute Rehab Dept [facility]):  [phone]   06/08/2019    [facility] 06/08/2019, 12:18 PM

## 2019-06-09 ENCOUNTER — Inpatient Hospital Stay (HOSPITAL_COMMUNITY): Payer: Medicare Other

## 2019-06-09 DIAGNOSIS — Z7189 Other specified counseling: Secondary | ICD-10-CM

## 2019-06-09 DIAGNOSIS — R52 Pain, unspecified: Secondary | ICD-10-CM

## 2019-06-09 DIAGNOSIS — K810 Acute cholecystitis: Secondary | ICD-10-CM

## 2019-06-09 DIAGNOSIS — Z515 Encounter for palliative care: Secondary | ICD-10-CM

## 2019-06-09 DIAGNOSIS — R531 Weakness: Secondary | ICD-10-CM

## 2019-06-09 MED ORDER — IOHEXOL 9 MG/ML PO SOLN
ORAL | Status: AC
  Filled 2019-06-09: qty 500

## 2019-06-09 MED ORDER — ORAL CARE MOUTH RINSE
15.0000 mL | Freq: Two times a day (BID) | OROMUCOSAL | Status: DC
  Administered 2019-06-09 – 2019-06-17 (×13): 15 mL via OROMUCOSAL

## 2019-06-09 MED ORDER — SODIUM CHLORIDE (PF) 0.9 % IJ SOLN
INTRAMUSCULAR | Status: AC
  Filled 2019-06-09: qty 50

## 2019-06-09 MED ORDER — IOHEXOL 9 MG/ML PO SOLN
ORAL | Status: AC
  Administered 2019-06-09: 500 mL
  Filled 2019-06-09: qty 500

## 2019-06-09 MED ORDER — IOHEXOL 9 MG/ML PO SOLN
500.0000 mL | ORAL | Status: AC
  Administered 2019-06-09: 500 mL via ORAL

## 2019-06-09 MED ORDER — IOHEXOL 300 MG/ML  SOLN
100.0000 mL | Freq: Once | INTRAMUSCULAR | Status: AC | PRN
  Administered 2019-06-09: 100 mL via INTRAVENOUS

## 2019-06-09 NOTE — Progress Notes (Signed)
PROGRESS NOTE    Carla Conway Pra  BZJ:[id number] DOB: Mar 19, 1927 DOA: 06/04/2019 PCP: Lujean Amel, MD   Brief Narrative: Carla Conway is a 86 y.o. female with hypertension, hyperlipidemia, CAD, dementia, CLL. Patient presented secondary to abdominal pain and found to have evidence of cholecystitis, pancreatitis and diverticulitis. She was started on empiric antibiotics and general surgery consulted.   Assessment & Plan:   Principal Problem:   Acute cholecystitis Active Problems:   Elevated liver enzymes   Dementia without behavioral disturbance (HCC)   Diverticulitis   Acute cholecystitis Seen on abdominal ultrasound. Patient is symptomatic.  Alkaline phosphatase and AST/ALT normal. General surgery is on board. Patient is not eating much secondary to pain. -General surgery recommendations -Continue Ceftriaxone -Palliative care consulted  Acute pancreatitis In setting of acute cholecystitis. No history of pancreatitis in the past. Lipase initially elevated to a peak of 747 and trended down. Diet is advanced but patient does have some pain with diet. -Continue morphine prn  Diverticulitis On Cefepime IV. -Continue Ceftriaxone  CLL Patient with significant leukocytosis (lymphocyte dominant on previous differential). Patient is on prednisone as an outpatient and follows with oncology. She was on prednisone 5 mg daily which has been held secondary to above infectious processes. Currently not with hospice but rather [facility].  E. Coli UTI Empirically treated with Cefepime. Sensitivities available. Switched to Ceftriaxone -Antibiotics as mentioned above  Essential hypertension Better controlled this morning -Continue amlodipine, metoprolol, hydralazine  Dementia -Continue Zyprexa, Zoloft   DVT prophylaxis: Heparin subq Code Status:   Code Status: DNR Family Communication: None at bedside Disposition Plan: Pending general surgery recommendations.  Anticipating discharge back to ALF   Consultants:   General surgery  Procedures:   None  Antimicrobials:  Cefepime  Ceftriaxone    Subjective: When asks if she has any questions, she asked me why she is here.  Objective: Vitals:   06/08/19 0519 06/08/19 1355 06/08/19 2046 06/09/19 0531  BP: (!) 176/72 (!) 189/72 (!) 155/57 136/65  Pulse: 66 77 70 74  Resp: 18 18 18 14   Temp: 98.5 F (36.9 C) 98.1 F (36.7 C) 99.1 F (37.3 C)   TempSrc: Oral Oral Oral   SpO2: 93% 92% 96% 90%  Weight:        Intake/Output Summary (Last 24 hours) at 06/09/2019 0538 Last data filed at 06/09/2019 0300 Gross per 24 hour  Intake 820 ml  Output 1100 ml  Net -280 ml   Filed Weights   06/05/19 0402  Weight: 61.2 kg    Examination:  General exam: Appears calm and comfortable Respiratory system: Clear to auscultation. Respiratory effort normal. Cardiovascular system: S1 & S2 heard, RRR. No murmurs, rubs, gallops or clicks. Gastrointestinal system: Abdomen is nondistended, soft and tender with guarding. Normal bowel sounds heard. Central nervous system: Alert and oriented to self. Extremities: No edema. No calf tenderness Skin: No cyanosis. No rashes Psychiatry: Judgement and insight appear impaired.    Data Reviewed: I have personally reviewed following labs and imaging studies  CBC: Recent Labs  Lab 06/04/19 2132 06/05/19 0550 06/06/19 0803 06/07/19 0452 06/08/19 0504  WBC 38.0* 35.9* 33.3* 39.5* 39.4*  NEUTROABS 3.8  --   --   --   --   HGB 12.5 12.4 11.2* 12.1 11.4*  HCT 41.1 40.0 36.7 36.8 37.1  MCV 96.9 95.5 96.8 90.9 94.6  PLT 173 160 134* 134* 017*   Basic Metabolic Panel: Recent Labs  Lab 06/04/19 2132 06/05/19 0550 06/06/19  2505 06/07/19 0452 06/08/19 0504  NA 139 141 139 135 136  K 4.4 4.4 4.5 3.8 3.7  CL 107 107 110 104 106  CO2 24 24 23  20* 20*  GLUCOSE 145* 117* 83 77 82  BUN 27* 27* 22 16 12   CREATININE 1.09* 1.07* 0.91 0.73 0.76  CALCIUM 8.6*  8.7* 8.1* 8.2* 8.1*  MG 2.1  --   --   --   --    GFR: Estimated Creatinine Clearance: 43.8 mL/min (by C-G formula based on SCr of 0.76 mg/dL). Liver Function Tests: Recent Labs  Lab 06/04/19 2132 06/05/19 0550 06/06/19 0803 06/07/19 0452 06/08/19 0504  AST 80* 51* 29 24 19   ALT 26 27 17 15 11   ALKPHOS 172* 169* 128* 113 111  BILITOT 1.5* 0.7 1.1 1.0 1.2  PROT 5.9* 6.1* 5.2* 5.1* 5.1*  ALBUMIN 3.5 3.6 3.0* 2.7* 2.7*   Recent Labs  Lab 06/04/19 2132 06/05/19 1115 06/06/19 0803  LIPASE 747* 194* 31   No results for input(s): AMMONIA in the last 168 hours. Coagulation Profile: No results for input(s): INR, PROTIME in the last 168 hours. Cardiac Enzymes: No results for input(s): CKTOTAL, CKMB, CKMBINDEX, TROPONINI in the last 168 hours. BNP (last 3 results) No results for input(s): PROBNP in the last 8760 hours. HbA1C: No results for input(s): HGBA1C in the last 72 hours. CBG: No results for input(s): GLUCAP in the last 168 hours. Lipid Profile: No results for input(s): CHOL, HDL, LDLCALC, TRIG, CHOLHDL, LDLDIRECT in the last 72 hours. Thyroid Function Tests: No results for input(s): TSH, T4TOTAL, FREET4, T3FREE, THYROIDAB in the last 72 hours. Anemia Panel: No results for input(s): VITAMINB12, FOLATE, FERRITIN, TIBC, IRON, RETICCTPCT in the last 72 hours. Sepsis Labs: No results for input(s): PROCALCITON, LATICACIDVEN in the last 168 hours.  Recent Results (from the past 240 hour(s))  Urine culture     Status: Abnormal   Collection Time: 06/05/19  2:06 AM   Specimen: Urine, Catheterized  Result Value Ref Range Status   Specimen Description   Final    URINE, CATHETERIZED Performed at [facility] [address], [place], [place] [address]    Special Requests NONE  Final   Culture >=100,000 COLONIES/mL ESCHERICHIA COLI (A)  Final   Report Status 06/07/2019 FINAL  Final   Organism ID, Bacteria ESCHERICHIA COLI (A)  Final      Susceptibility    Escherichia coli - MIC*    AMPICILLIN >=32 RESISTANT Resistant     CEFAZOLIN 16 SENSITIVE Sensitive     CEFTRIAXONE <=0.25 SENSITIVE Sensitive     CIPROFLOXACIN <=0.25 SENSITIVE Sensitive     GENTAMICIN <=1 SENSITIVE Sensitive     IMIPENEM <=0.25 SENSITIVE Sensitive     NITROFURANTOIN <=16 SENSITIVE Sensitive     TRIMETH/SULFA >=320 RESISTANT Resistant     AMPICILLIN/SULBACTAM >=32 RESISTANT Resistant     PIP/TAZO <=4 SENSITIVE Sensitive     * >=100,000 COLONIES/mL ESCHERICHIA COLI  Respiratory Panel by RT PCR (Flu A&B, Covid) - Nasopharyngeal Swab     Status: None   Collection Time: 06/05/19  4:02 AM   Specimen: Nasopharyngeal Swab  Result Value Ref Range Status   SARS Coronavirus 2 by RT PCR NEGATIVE NEGATIVE Final    Comment: (NOTE) SARS-CoV-2 target nucleic acids are NOT DETECTED. The SARS-CoV-2 RNA is generally detectable in upper respiratoy specimens during the acute phase of infection. The lowest concentration of SARS-CoV-2 viral copies this assay can detect is 131 copies/mL. A negative  result does not preclude SARS-Cov-2 infection and should not be used as the sole basis for treatment or other patient management decisions. A negative result may occur with  improper specimen collection/handling, submission of specimen other than nasopharyngeal swab, presence of viral mutation(s) within the areas targeted by this assay, and inadequate number of viral copies (<131 copies/mL). A negative result must be combined with clinical observations, patient history, and epidemiological information. The expected result is Negative. Fact Sheet for Patients:  PinkCheek.be Fact Sheet for Healthcare Providers:  GravelBags.it This test is not yet ap proved or cleared by the [place] FDA and  has been authorized for detection and/or diagnosis of SARS-CoV-2 by FDA under an Emergency Use Authorization (EUA). This EUA will remain  in  effect (meaning this test can be used) for the duration of the COVID-19 declaration under Section 564(b)(1) of the Act, 21 U.S.C. section 360bbb-3(b)(1), unless the authorization is terminated or revoked sooner.    Influenza A by PCR NEGATIVE NEGATIVE Final   Influenza B by PCR NEGATIVE NEGATIVE Final    Comment: (NOTE) The Xpert Xpress SARS-CoV-2/FLU/RSV assay is intended as an aid in  the diagnosis of influenza from Nasopharyngeal swab specimens and  should not be used as a sole basis for treatment. Nasal washings and  aspirates are unacceptable for Xpert Xpress SARS-CoV-2/FLU/RSV  testing. Fact Sheet for Patients: PinkCheek.be Fact Sheet for Healthcare Providers: GravelBags.it This test is not yet approved or cleared by the [place] FDA and  has been authorized for detection and/or diagnosis of SARS-CoV-2 by  FDA under an Emergency Use Authorization (EUA). This EUA will remain  in effect (meaning this test can be used) for the duration of the  Covid-19 declaration under Section 564(b)(1) of the Act, 21  U.S.C. section 360bbb-3(b)(1), unless the authorization is  terminated or revoked. Performed at [facility], [place] [address] [place], [place] [address]          Radiology Studies: No results found.      Scheduled Meds: . amLODipine  10 mg Oral Daily  . aspirin EC  81 mg Oral Daily  . heparin  5,000 Units Subcutaneous Q8H  . hydrALAZINE  25 mg Oral TID  . metoprolol tartrate  25 mg Oral BID  . montelukast  10 mg Oral QHS  . OLANZapine  2.5 mg Oral QHS  . pantoprazole  40 mg Oral Daily  . sertraline  100 mg Oral Daily   Continuous Infusions: . cefTRIAXone (ROCEPHIN)  IV 200 mL/hr at 06/08/19 2152  . metronidazole 100 mL/hr at 06/09/19 0232     LOS: 4 days     Cordelia Poche, MD [facility] 06/09/2019, 5:38 AM  If 7PM-7AM, please contact night-coverage [email or web address]

## 2019-06-09 NOTE — Consult Note (Signed)
Consultation Note Date: 06/09/2019   Patient Name: Carla Conway  DOB: [birth year]  MRN: [id number]  Age / Sex: 86 y.o., female  PCP: Lujean Amel, MD Referring Physician: Mariel Aloe, MD  Reason for Consultation: Establishing goals of care  HPI/Patient Profile: 86 y.o. female  with past medical history of dementia, CLL, HTN admitted on 06/04/2019 with abdominal pain cholelithiasis, possible cholecystitis, pancreatitis, acute diverticulitis.   Palliative consult for goals of care.   Clinical Assessment and Goals of Care:  chart reviewed, discussed with bedside RN, patient seen and examined.   [name] is hard of hearing, she opens her eyes, follows some commands. She doesn't currently display non verbal gestures of distress or discomfort: no wincing, no grimacing, no clenching of the jaw, no furrowing of the brow noted. She is hard of hearing. She responds to questions asked by saying that she is worried about her daughter passing away and that she loves her daughter very much.   No family at bedside, note plans for today for CT abdomen to be repeated for repeat evaluation. Surgical colleagues are following closely.   Palliative medicine is specialized medical care for people living with serious illness. It focuses on providing relief from the symptoms and stress of a serious illness. The goal is to improve quality of life for both the patient and the family.  Goals of care: Broad aims of medical therapy in relation to the patient's values and preferences. Our aim is to provide medical care aimed at enabling patients to achieve the goals that matter most to them, given the circumstances of their particular medical situation and their constraints.   Discussed with bedside RN and patient, PO intake is minimal at this point.   Please note recommendations from a PMT standpoint as listed below.   NEXT OF KIN  daughter [name] [phone].   SUMMARY OF RECOMMENDATIONS    Agree with DNR Continue current measures Patient from ALF where she is connected with [facility] palliative services Follow up CT scan abdomen, to be done today, follow up surgery recommendations. If a more symptom/comfort focused approach is deemed most appropriate, then would recommend either residential hospice or ALF with full hospice support at the time of discharge. PMT to follow. Thank you for the consult.   Code Status/Advance Care Planning:  DNR    Symptom Management:    as above   Palliative Prophylaxis:   Bowel Regimen   Psycho-social/Spiritual:   Desire for further Chaplaincy support:yes  Additional Recommendations: Caregiving  Support/Resources  Prognosis:   Unable to determine  Discharge Planning: To Be Determined      Primary Diagnoses: Present on Admission: . Acute cholecystitis . Dementia without behavioral disturbance ([place]) . Diverticulitis   I have reviewed the medical record, interviewed the patient and family, and examined the patient. The following aspects are pertinent.  Past Medical History:  Diagnosis Date  . Asthma   . CAD (coronary artery disease)   . Dementia without behavioral disturbance ([place]) 06/05/2019  .  Falls frequently   . Hyperlipidemia   . Hypertension    Social History   Socioeconomic History  . Marital status: Widowed    Spouse name: Not on file  . Number of children: 5  . Years of education: Not on file  . Highest education level: Not on file  Occupational History  . Not on file  Tobacco Use  . Smoking status: Former Smoker    Types: Cigarettes    Start date: 05/18/1983  . Smokeless tobacco: Never Used  Substance and Sexual Activity  . Alcohol use: No  . Drug use: No  . Sexual activity: Not on file  Other Topics Concern  . Not on file  Social History Narrative   Lives in retirement home.     Social Determinants of Health    Financial Resource Strain:   . Difficulty of Paying Living Expenses: Not on file  Food Insecurity:   . Worried About Charity fundraiser in the Last Year: Not on file  . Ran Out of Food in the Last Year: Not on file  Transportation Needs:   . Lack of Transportation (Medical): Not on file  . Lack of Transportation (Non-Medical): Not on file  Physical Activity:   . Days of Exercise per Week: Not on file  . Minutes of Exercise per Session: Not on file  Stress:   . Feeling of Stress : Not on file  Social Connections:   . Frequency of Communication with Friends and Family: Not on file  . Frequency of Social Gatherings with Friends and Family: Not on file  . Attends Religious Services: Not on file  . Active Member of Clubs or Organizations: Not on file  . Attends Archivist Meetings: Not on file  . Marital Status: Not on file   Family History  Problem Relation Age of Onset  . Cancer Father        colon   Scheduled Meds: . amLODipine  10 mg Oral Daily  . aspirin EC  81 mg Oral Daily  . heparin  5,000 Units Subcutaneous Q8H  . hydrALAZINE  25 mg Oral TID  . iohexol      . metoprolol tartrate  25 mg Oral BID  . montelukast  10 mg Oral QHS  . OLANZapine  2.5 mg Oral QHS  . pantoprazole  40 mg Oral Daily  . sertraline  100 mg Oral Daily   Continuous Infusions: . cefTRIAXone (ROCEPHIN)  IV 200 mL/hr at 06/08/19 2152  . metronidazole 500 mg (06/09/19 0832)   PRN Meds:.acetaminophen **OR** acetaminophen, hydrALAZINE, morphine injection Medications Prior to Admission:  Prior to Admission medications   Medication Sig Start Date End Date Taking? Authorizing Provider  acetaminophen (TYLENOL) 325 MG tablet Take 325 mg by mouth daily as needed for mild pain.    Yes [provider]  acyclovir (ZOVIRAX) 400 MG tablet Take 1 tablet (400 mg total) by mouth daily. 06/04/17  Yes Gorsuch, Ni, MD  amLODipine (NORVASC) 10 MG tablet Take 1 tablet (10 mg total) by mouth daily.  11/24/17  Yes Emokpae, Courage, MD  aspirin EC 81 MG EC tablet Take 1 tablet (81 mg total) by mouth daily. 05/02/17  Yes Oswald Hillock, MD  hydrALAZINE (APRESOLINE) 25 MG tablet Take 1 tablet (25 mg total) by mouth 3 (three) times daily. 11/23/17 12/23/19 Yes Emokpae, Courage, MD  lovastatin (MEVACOR) 40 MG tablet Take 40 mg by mouth at bedtime.  05/09/13  Yes [provider]  metoprolol tartrate (LOPRESSOR) 25 MG tablet Take 1 tablet (25 mg total) by mouth 2 (two) times daily. 11/23/17  Yes Emokpae, Courage, MD  montelukast (SINGULAIR) 10 MG tablet Take 10 mg by mouth at bedtime. 05/15/17  Yes [provider]  OLANZapine (ZYPREXA) 2.5 MG tablet Take 2.5 mg by mouth at bedtime.   Yes [provider]  omeprazole (PRILOSEC) 20 MG capsule Take 1 capsule (20 mg total) by mouth daily. 01/06/17  Yes Gorsuch, Ni, MD  potassium chloride (K-DUR) 10 MEQ tablet Take 10 mEq by mouth daily. 03/24/16  Yes [provider]  predniSONE (DELTASONE) 5 MG tablet Take 1 tablet (5 mg total) by mouth daily with breakfast. 03/29/18  Yes Gorsuch, Ni, MD  sertraline (ZOLOFT) 100 MG tablet Take 100 mg by mouth daily.    Yes [provider]  HYDROcodone-acetaminophen (NORCO/VICODIN) 5-325 MG tablet Take 1 tablet by mouth every 8 (eight) hours as needed for pain. 03/23/19   [provider]   Allergies  Allergen Reactions  . Rituximab Other (See Comments)    "She coded"/Upset stomach and blood pressure dropped   Review of Systems Hard of hearing  Physical Exam Ms Shahida is a weak appearing lady resting in bed Hard of hearing Has dementia, responds to questions asked by saying that she is afraid that her daughter is going to die.  Abdomen has voluntary guarding, not distended No edema Follows some commands Opens eyes when name is called Hard of hearing, hears well out of R side  Vital Signs: BP (!) 154/64   Pulse 70   Temp 98.9 F (37.2 C) (Oral)   Resp 14   Wt 61.2 kg    SpO2 90%   BMI 21.79 kg/m  Pain Scale: 0-10 POSS *See Group Information*: 1-Acceptable,Awake and alert Pain Score: Asleep   SpO2: SpO2: 90 % O2 Device:SpO2: 90 % O2 Flow Rate: .   IO: Intake/output summary:   Intake/Output Summary (Last 24 hours) at 06/09/2019 1208 Last data filed at 06/09/2019 0300 Gross per 24 hour  Intake 820 ml  Output 200 ml  Net 620 ml    LBM: Last BM Date: (PTA pt unsure) Baseline Weight: Weight: 61.2 kg Most recent weight: Weight: 61.2 kg     Palliative Assessment/Data:   PPS 30%  Time In:  11 Time Out:  12 Time Total:  60  Greater than 50%  of this time was spent counseling and coordinating care related to the above assessment and plan.  Signed by: Loistine Chance, MD   Please contact Palliative Medicine Team phone at [phone] for questions and concerns.  For individual provider: See Shea Evans

## 2019-06-09 NOTE — Progress Notes (Signed)
Assessment & Plan: HD#6 Abdominal pain Cholelithiasis, possible cholecystitis Pancreatitis Acute diverticulitis  Elevated WBC - likely due to CLL  Difficult exam - hurts everywhere you palpate, HOH, dementia  Maxipime and Flagyl IV  Will repeat CT abd/pelvis today to evaluate cholecystitis, pancreatitis, diverticulitis  Patient is difficult exam and complex surgical decision-making.  Will repeat CT scan and review, then discuss further management plans with primary service and family.  Severe dementia Hard of hearing Chronic lymphocytic leukemia Hypertension DNR       Armandina Gemma, MD       [facility]       Office: [phone]   Chief Complaint: Abdominal pain  Subjective: Patient in bed, nursing in room.  Appears comfortable.  Objective: Vital signs in last 24 hours: Temp:  [98.1 F (36.7 C)-99.1 F (37.3 C)] 98.9 F (37.2 C) (03/04 0531) Pulse Rate:  [70-77] 74 (03/04 0531) Resp:  [14-18] 14 (03/04 0531) BP: (136-189)/(57-72) 136/65 (03/04 0531) SpO2:  [90 %-96 %] 90 % (03/04 0531) Last BM Date: (PTA pt unsure)  Intake/Output from previous day: 03/03 0701 - 03/04 0700 In: 820 [P.O.:120; IV Piggyback:700] Out: 1100 [Urine:1100] Intake/Output this shift: No intake/output data recorded.  Physical Exam: HEENT - sclerae clear, mucous membranes moist Neck - soft Abdomen - soft without distension; BS present; guards all quadrants to palpation Ext - no edema, complains of tenderness to palpation all four extremities   Lab Results:  Recent Labs    06/07/19 0452 06/08/19 0504  WBC 39.5* 39.4*  HGB 12.1 11.4*  HCT 36.8 37.1  PLT 134* 124*   BMET Recent Labs    06/07/19 0452 06/08/19 0504  NA 135 136  K 3.8 3.7  CL 104 106  CO2 20* 20*  GLUCOSE 77 82  BUN 16 12  CREATININE 0.73 0.76  CALCIUM 8.2* 8.1*   PT/INR No results for input(s): LABPROT, INR in the last 72 hours. Comprehensive Metabolic Panel:    Component  Value Date/Time   NA 136 06/08/2019 0504   NA 135 06/07/2019 0452   NA 138 02/17/2017 0810   NA 139 01/06/2017 0819   K 3.7 06/08/2019 0504   K 3.8 06/07/2019 0452   K 3.7 02/17/2017 0810   K 4.1 01/06/2017 0819   CL 106 06/08/2019 0504   CL 104 06/07/2019 0452   CO2 20 (L) 06/08/2019 0504   CO2 20 (L) 06/07/2019 0452   CO2 27 02/17/2017 0810   CO2 27 01/06/2017 0819   BUN 12 06/08/2019 0504   BUN 16 06/07/2019 0452   BUN 16.4 02/17/2017 0810   BUN 22.8 01/06/2017 0819   CREATININE 0.76 06/08/2019 0504   CREATININE 0.73 06/07/2019 0452   CREATININE 1.1 02/17/2017 0810   CREATININE 1.1 01/06/2017 0819   GLUCOSE 82 06/08/2019 0504   GLUCOSE 77 06/07/2019 0452   GLUCOSE 92 02/17/2017 0810   GLUCOSE 89 01/06/2017 0819   CALCIUM 8.1 (L) 06/08/2019 0504   CALCIUM 8.2 (L) 06/07/2019 0452   CALCIUM 9.3 02/17/2017 0810   CALCIUM 9.9 01/06/2017 0819   AST 19 06/08/2019 0504   AST 24 06/07/2019 0452   AST 13 02/17/2017 0810   AST 13 01/06/2017 0819   ALT 11 06/08/2019 0504   ALT 15 06/07/2019 0452   ALT 13 02/17/2017 0810   ALT 9 01/06/2017 0819   ALKPHOS 111 06/08/2019 0504   ALKPHOS 113 06/07/2019 0452   ALKPHOS 74 02/17/2017 0810   ALKPHOS 71  01/06/2017 0819   BILITOT 1.2 06/08/2019 0504   BILITOT 1.0 06/07/2019 0452   BILITOT 0.60 02/17/2017 0810   BILITOT 0.60 01/06/2017 0819   PROT 5.1 (L) 06/08/2019 0504   PROT 5.1 (L) 06/07/2019 0452   PROT 6.4 02/17/2017 0810   PROT 6.6 01/06/2017 0819   ALBUMIN 2.7 (L) 06/08/2019 0504   ALBUMIN 2.7 (L) 06/07/2019 0452   ALBUMIN 3.9 02/17/2017 0810   ALBUMIN 4.0 01/06/2017 0819    Studies/Results: No results found.    Armandina Gemma 06/09/2019  Patient ID: Larwance Sachs, female   DOB: 1926-09-23, 86 y.o.   MRN: [id number]

## 2019-06-09 NOTE — Plan of Care (Signed)
  Problem: Clinical Measurements: Goal: Ability to maintain clinical measurements within normal limits will improve Outcome: Progressing Goal: Will remain free from infection Outcome: Progressing Goal: Diagnostic test results will improve Outcome: Progressing Goal: Respiratory complications will improve Outcome: Progressing Goal: Cardiovascular complication will be avoided Outcome: Progressing   Problem: Elimination: Goal: Will not experience complications related to urinary retention Outcome: Progressing   Problem: Safety: Goal: Ability to remain free from injury will improve Outcome: Progressing   Problem: Skin Integrity: Goal: Risk for impaired skin integrity will decrease Outcome: Progressing   Problem: Education: Goal: Knowledge of General Education information will improve Description: Including pain rating scale, medication(s)/side effects and non-pharmacologic comfort measures Outcome: Not Progressing   Problem: Health Behavior/Discharge Planning: Goal: Ability to manage health-related needs will improve Outcome: Not Progressing   Problem: Activity: Goal: Risk for activity intolerance will decrease Outcome: Not Progressing   Problem: Nutrition: Goal: Adequate nutrition will be maintained Outcome: Not Progressing   Problem: Coping: Goal: Level of anxiety will decrease Outcome: Not Progressing   Problem: Elimination: Goal: Will not experience complications related to bowel motility Outcome: Not Progressing   Problem: Pain Managment: Goal: General experience of comfort will improve Outcome: Not Progressing

## 2019-06-10 LAB — BASIC METABOLIC PANEL
Anion gap: 9 (ref 5–15)
BUN: 18 mg/dL (ref 8–23)
CO2: 22 mmol/L (ref 22–32)
Calcium: 8.2 mg/dL — ABNORMAL LOW (ref 8.9–10.3)
Chloride: 105 mmol/L (ref 98–111)
Creatinine, Ser: 0.8 mg/dL (ref 0.44–1.00)
GFR calc Af Amer: >60 mL/min (ref >60)
GFR calc non Af Amer: >60 mL/min (ref >60)
Glucose, Bld: 75 mg/dL (ref 70–99)
Potassium: 3.4 mmol/L — ABNORMAL LOW (ref 3.5–5.1)
Sodium: 136 mmol/L (ref 135–145)

## 2019-06-10 LAB — CBC
HCT: 33.4 % — ABNORMAL LOW (ref 36.0–46.0)
Hemoglobin: 10.3 g/dL — ABNORMAL LOW (ref 12.0–15.0)
MCH: 29.3 pg (ref 26.0–34.0)
MCHC: 30.8 g/dL (ref 30.0–36.0)
MCV: 95.2 fL (ref 80.0–100.0)
Platelets: 123 10*3/uL — ABNORMAL LOW (ref 150–400)
RBC: 3.51 MIL/uL — ABNORMAL LOW (ref 3.87–5.11)
RDW: 17.7 % — ABNORMAL HIGH (ref 11.5–15.5)
WBC: 28.6 10*3/uL — ABNORMAL HIGH (ref 4.0–10.5)
nRBC: 0 % (ref 0.0–0.2)

## 2019-06-10 LAB — LIPASE, BLOOD: Lipase: 26 U/L (ref 11–51)

## 2019-06-10 MED ORDER — PRO-STAT SUGAR FREE PO LIQD
30.0000 mL | Freq: Two times a day (BID) | ORAL | Status: DC
  Administered 2019-06-10 – 2019-06-17 (×11): 30 mL via ORAL
  Filled 2019-06-10 (×13): qty 30

## 2019-06-10 MED ORDER — BOOST / RESOURCE BREEZE PO LIQD CUSTOM
1.0000 | Freq: Two times a day (BID) | ORAL | Status: DC
  Administered 2019-06-10 – 2019-06-17 (×10): 1 via ORAL

## 2019-06-10 MED ORDER — PIPERACILLIN-TAZOBACTAM 3.375 G IVPB
3.3750 g | Freq: Three times a day (TID) | INTRAVENOUS | Status: DC
  Administered 2019-06-10 – 2019-06-13 (×10): 3.375 g via INTRAVENOUS
  Filled 2019-06-10 (×10): qty 50

## 2019-06-10 MED ORDER — UNJURY CHICKEN SOUP POWDER
2.0000 [oz_av] | Freq: Four times a day (QID) | ORAL | Status: DC
  Administered 2019-06-10 – 2019-06-17 (×14): 2 [oz_av] via ORAL
  Filled 2019-06-10 (×31): qty 27

## 2019-06-10 MED ORDER — ADULT MULTIVITAMIN W/MINERALS CH
1.0000 | ORAL_TABLET | Freq: Every day | ORAL | Status: DC
  Administered 2019-06-10 – 2019-06-17 (×8): 1 via ORAL
  Filled 2019-06-10 (×8): qty 1

## 2019-06-10 MED ORDER — SACCHAROMYCES BOULARDII 250 MG PO CAPS
250.0000 mg | ORAL_CAPSULE | Freq: Two times a day (BID) | ORAL | Status: DC
  Administered 2019-06-10 – 2019-06-17 (×15): 250 mg via ORAL
  Filled 2019-06-10 (×15): qty 1

## 2019-06-10 NOTE — Progress Notes (Signed)
HU:[id number] pain  Subjective: She seems less tender this AM.  She did complain of pain right upper quadrant but the other sites she did not really complain of pain when I examined her.  Current nursing staff not aware of how much she actually took PO yesterday.    Objective: Vital signs in last 24 hours: Temp:  [98 F (36.7 C)-98.3 F (36.8 C)] 98 F (36.7 C) (03/05 0619) Pulse Rate:  [62-70] 62 (03/05 0619) Resp:  [15-18] 15 (03/05 0619) BP: (117-154)/(59-71) 117/61 (03/05 0619) SpO2:  [92 %-94 %] 92 % (03/05 0619) Last BM Date: (PTA ) 810 PO Urine 700 She remains afebrile VSS K+ 3.4, WBC is down to 28.6 H/H 10/33 Platelets down 123 CT scan 06/09/2019: New peripancreatic fat stranding around the head and uncinate process consistent with acute pancreatitis.  No evidence of peripancreatic fluid or pancreatic necrosis. Persistent diverticulosis of the sigmoid colon not significantly changed over the last 4 days.  No abscess or free air. Crease gallbladder distention with persistent wall enhancement and pericholecystic fat stranding. Culture >=100,000 COLONIES/mL ESCHERICHIA COLIAbnormal    Report Status 06/07/2019 FINAL   Organism ID, Bacteria ESCHERICHIA COLIAbnormal    Resulting Agency [facility]  Susceptibility   Escherichia coli    MIC    AMPICILLIN >=32 RESIST... Resistant    AMPICILLIN/SULBACTAM >=32 RESIST... Resistant    CEFAZOLIN 16 SENSITIVE  Sensitive    CEFTRIAXONE <=0.25 SENS... Sensitive    CIPROFLOXACIN <=0.25 SENS... Sensitive    GENTAMICIN <=1 SENSITIVE  Sensitive    IMIPENEM <=0.25 SENS... Sensitive    NITROFURANTOIN <=16 SENSIT... Sensitive    PIP/TAZO <=4 SENSITIVE  Sensitive    TRIMETH/SULFA >=320 RESIS... Resistant        Intake/Output from previous day: 03/04 0701 - 03/05 0700 In: 810 [P.O.:810] Out: 700 [Urine:700] Intake/Output this shift: No intake/output data recorded.  General appearance: alert, cooperative and no distress Resp:  clear to auscultation bilaterally GI: Soft few bowel sounds.  She has just seems tender in the right upper quadrant this morning..  Lab Results:  Recent Labs    06/08/19 0504 06/10/19 0552  WBC 39.4* 28.6*  HGB 11.4* 10.3*  HCT 37.1 33.4*  PLT 124* 123*    BMET Recent Labs    06/08/19 0504 06/10/19 0552  NA 136 136  K 3.7 3.4*  CL 106 105  CO2 20* 22  GLUCOSE 82 75  BUN 12 18  CREATININE 0.76 0.80  CALCIUM 8.1* 8.2*   PT/INR No results for input(s): LABPROT, INR in the last 72 hours.  Recent Labs  Lab 06/04/19 2132 06/05/19 0550 06/06/19 0803 06/07/19 0452 06/08/19 0504  AST 80* 51* 29 24 19   ALT 26 27 17 15 11   ALKPHOS 172* 169* 128* 113 111  BILITOT 1.5* 0.7 1.1 1.0 1.2  PROT 5.9* 6.1* 5.2* 5.1* 5.1*  ALBUMIN 3.5 3.6 3.0* 2.7* 2.7*     Lipase     Component Value Date/Time   LIPASE 31 06/06/2019 0803     Medications: . amLODipine  10 mg Oral Daily  . aspirin EC  81 mg Oral Daily  . heparin  5,000 Units Subcutaneous Q8H  . hydrALAZINE  25 mg Oral TID  . mouth rinse  15 mL Mouth Rinse BID  . metoprolol tartrate  25 mg Oral BID  . montelukast  10 mg Oral QHS  . OLANZapine  2.5 mg Oral QHS  . pantoprazole  40 mg Oral Daily  .  sertraline  100 mg Oral Daily    Assessment/Plan Severe dementia Hard of hearing Chronic lymphocytic leukemia Hypertension Hyperlipidemia E coli UTI 2/28 DNR  Abdominal pain Cholelithiasis, possible cholecystitis - improved on CT 3/4 Sigmoid Diverticulitis - ongoing per CT 3/4 Pancreatitis - ongoing  By CT 06/09/19 - WBC 35.9>>33.3>>39.5>> 39.4>> 28.6 -  CLL  FEN: soft diet ID: Maxipime 2/28-3/4Flagyl 2/28-3/4;  Zosyn 3/5 >> day 1 DVT: Heparin Follow-up: TBD  Plan: Regular back or down to full liquids, switch her antibiotic to Zosyn, I have asked the dietitian to see her and help with supplements.  Continue conservative medical management.  LOS: 5 days    Carla Conway 06/10/2019 Please see Amion

## 2019-06-10 NOTE — Progress Notes (Signed)
Initial Nutrition Assessment  DOCUMENTATION CODES:   Severe malnutrition in context of chronic illness  INTERVENTION:  Boost Breeze po BID, each supplement provides 250 kcal and 9 grams of protein  Unjury chicken soup, each 27 gram packet provides 100 kcal, and 21 grams of protein  Prostat 30 ml BID, each supplement provides 100 kcal and 15 grams of protein.   Recommend presence of nursing staff at meal times to encourage po intake  MVI with minerals daily  Patient is at risk for refeeding, recommend monitoring magnesium, potassium, and phosphorus MD to replete as needed  If po does not improve within the next 24-48 hrs, nutrition support should be considered as appropriate to [place] DIAGNOSIS:   Severe Malnutrition related to chronic illness(dementia) as evidenced by per patient/family report, moderate fat depletion, severe fat depletion, mild muscle depletion, moderate muscle depletion.    GOAL:   Patient will meet greater than or equal to 90% of their needs    MONITOR:   PO intake, Supplement acceptance, Diet advancement, Labs, Weight trends, I & O's, Skin  REASON FOR ASSESSMENT:   Consult Assessment of nutrition requirement/status  ASSESSMENT:   86 year old female with past medical history of HTN, HLD, CAD, HOH, and severe dementia presented from SNF for evaluation of abdominal pain. In ED, CT abdomen consistent with cholecystitis and acute sigmoid diverticulitis.  Patient awake, alert, sitting up in bed this morning, RN in room. RN reports patient ate well when daughter was present earlier in the week. RD observed patient taking po medications without swallowing difficulty, additional sips of water needed for larger pill. RD inquired about about appetite, patient stated that she did care if she ever eats again. She endorses having some abdominal pain and rubs upper abdominal area, stated sometimes it just hurts. RD suggested that she may be feeling hungry,  and encouraged po intake. Patient reports that she did not want food, endorses feeling thirsty and asked RD to pour out some of her coffee to make it easier to drink. Will try Boost Breeze, Unjury Chicken Soup, and Prostat supplements to aid with calorie and protein needs.    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 Patient stated that she really likes breakfast, but no one will go with her anymore. She recalls eating bacon, toast, and coffee with cream. Stated that she likes to eat at 10:30, but [name] only likes to have lunch. States that [name] gets off of work at 2, then has to go take care of the dogs and is too busy to eat with her. Recommend nursing staff presence at meal times to encourage po intake.   Noted no BM this admission, patient may benefit from bowel regimen.   I/Os: +1964 ml since admit UOP: 700 ml x 24 hrs  Current wt 134.64 lbs Per chart review, on 03/25/19 pt wt 70.3 kg (154.66 lbs). Patient has experienced 20 lb (12.9%) wt loss in 2 months which is severe for time frame.  Medications reviewed and include: protonix, florastor, zosyn  Labs: K 3.4 (L), WBC 28.6 (H) trending down  NUTRITION - FOCUSED PHYSICAL EXAM:    Most Recent Value  Orbital Region  Severe depletion  Upper Arm Region  Severe depletion  Thoracic and Lumbar Region  Unable to assess  Buccal Region  Moderate depletion  Temple Region  Moderate depletion  Clavicle Bone Region  Moderate depletion  Clavicle  and Acromion Bone Region  Moderate depletion  Scapular Bone Region  Unable to assess  Dorsal Hand  Severe depletion  Patellar Region  Unable to assess  Anterior Thigh Region  Unable to assess  Posterior Calf Region  Severe depletion  Edema (RD Assessment)  None  Hair  Reviewed  Eyes  Reviewed  Mouth  Reviewed  Skin  Reviewed  Nails  Reviewed       Diet Order:   Diet Order            Diet full liquid Room  service appropriate? Yes; Fluid consistency: Thin  Diet effective now              EDUCATION NEEDS:   Not appropriate for education at this time  Skin:  Skin Assessment: Reviewed RN Assessment  Last BM:  PTA  Height:   Ht Readings from Last 1 Encounters:  03/25/19 5' 6"  (1.676 m)    Weight:   Wt Readings from Last 1 Encounters:  06/05/19 61.2 kg    Ideal Body Weight:     BMI:  Body mass index is 21.79 kg/m.  Estimated Nutritional Needs:   Kcal:  1400-1600  Protein:  70-80  Fluid:  >/= 1.5 L/day   Lajuan Lines, RD, LDN Clinical Nutrition After Hours/Weekend Pager # in [place]

## 2019-06-10 NOTE — Progress Notes (Signed)
Pharmacy Antibiotic Note  Carla Conway is a 86 y.o. female known to pharmacy from abx consults with this admission. She was treated with cefepime/flagyl on admission for acute cholecystitis and possible diverticulits with pancreatitis and de-escalated to ceftriaxone/flagyl on 3/3.  CCS consulted pharmacy to change abx to zosyn on 3/5.  Today, 06/10/2019: - afeb, abc elevated but trending down - scr stable (crcl~44)   Plan: - zosyn 3.375 gm IV q8h (infuse over 4 hrs) - monitor clinical status ____________________________________________  Weight: 135 lb (61.2 kg)  Temp (24hrs), Avg:98.1 F (36.7 C), Min:98 F (36.7 C), Max:98.3 F (36.8 C)  Recent Labs  Lab 06/05/19 0550 06/06/19 0803 06/07/19 0452 06/08/19 0504 06/10/19 0552  WBC 35.9* 33.3* 39.5* 39.4* 28.6*  CREATININE 1.07* 0.91 0.73 0.76 0.80    Estimated Creatinine Clearance: 43.8 mL/min (by C-G formula based on SCr of 0.8 mg/dL).    Allergies  Allergen Reactions  . Rituximab Other (See Comments)    "She coded"/Upset stomach and blood pressure dropped    Antimicrobials this admission:  2/28 Cefepime  >>3/3 3/3 Rocephin>> 3/5 2/28 Flagyl>>3/5 3/5 zosyn >>   Microbiology results:  2/28 UCx: >100k GNR - Ecoli - resistant to amp, bactrim, and unasyn FINAL  Thank you for allowing pharmacy to be a part of this patient's care.  Lynelle Doctor 06/10/2019 10:37 AM

## 2019-06-10 NOTE — Progress Notes (Signed)
PROGRESS NOTE    Carla Conway  YYQ:[id number] DOB: 01-25-1927 DOA: 06/04/2019 PCP: Lujean Amel, MD   Brief Narrative: Carla Conway is a 86 y.o. female with hypertension, hyperlipidemia, CAD, dementia, CLL. Patient presented secondary to abdominal pain and found to have evidence of cholecystitis, pancreatitis and diverticulitis. She was started on empiric antibiotics and general surgery consulted.   Assessment & Plan:   Principal Problem:   Acute cholecystitis Active Problems:   Elevated liver enzymes   Dementia without behavioral disturbance (HCC)   Diverticulitis   Acute cholecystitis Seen on abdominal ultrasound. Patient is symptomatic.  Alkaline phosphatase and AST/ALT normal. General surgery is on board. Patient is not eating much secondary to pain. -General surgery recommendations: Switch to Zosyn, full liquid diet -Palliative care recommendations: DNR, continue current mode of care; if transitioned to comfort care, residential hospice vs ALF with hospice services  Acute pancreatitis In setting of acute cholecystitis and sludge noted on RUQ ultrasound. No history of pancreatitis in the past. Lipase initially elevated to a peak of 747 and trended down. Worsening symptoms with oral intake. Repeat CT scan shows pancreatitis and symptoms currently seem consistent with acute pancreatitis again. -Continue morphine prn -Repeat lipase  Diverticulitis On Cefepime IV which was transitioned to Ceftriaxone. Now on Zosyn as mentioned above -Continue antibiotics as above  CLL Patient with significant leukocytosis (lymphocyte dominant on previous differential). Patient is on prednisone as an outpatient and follows with oncology. She was on prednisone 5 mg daily which has been held secondary to above infectious processes. Currently not with hospice but rather [facility].  E. Coli UTI Empirically treated with Cefepime. Sensitivities available. Switched to Ceftriaxone  and now to Zosyn. -Antibiotics as mentioned above  Essential hypertension Better controlled this morning -Continue amlodipine, metoprolol, hydralazine  Dementia -Continue Zyprexa, Zoloft   DVT prophylaxis: Heparin subq Code Status:   Code Status: DNR Family Communication: Daughter on telephone Disposition Plan: Pending general surgery recommendations. Anticipating discharge back to ALF vs hospice   Consultants:   General surgery  Palliative care medicine  Procedures:   None  Antimicrobials:  Cefepime  Ceftriaxone  Zosyn   Subjective: Abdominal pain. Does not want to eat.  Objective: Vitals:   06/09/19 1350 06/09/19 2145 06/10/19 0619 06/10/19 0959  BP: (!) 131/59 (!) 142/71 117/61 (!) 141/93  Pulse: 66 67 62 68  Resp: 16 18 15 18   Temp: 98.3 F (36.8 C) 98.1 F (36.7 C) 98 F (36.7 C)   TempSrc: Oral Oral Oral   SpO2: 94% 92% 92% 95%  Weight:        Intake/Output Summary (Last 24 hours) at 06/10/2019 1024 Last data filed at 06/10/2019 0948 Gross per 24 hour  Intake 1050 ml  Output 700 ml  Net 350 ml   Filed Weights   06/05/19 0402  Weight: 61.2 kg    Examination:  General exam: Appears calm and comfortable Respiratory system: Clear to auscultation. Respiratory effort normal. Cardiovascular system: S1 & S2 heard, RRR. No murmurs, rubs, gallops or clicks. Gastrointestinal system: Abdomen is nondistended, soft and tender in RUQ and epigastric area with guarding. Bowel sounds heard. Central nervous system: Alert and oriented to person and place (she knows she is in a hospital but does not know which as she "never thought about it." No focal neurological deficits. Extremities: No edema. No calf tenderness Skin: No cyanosis. No rashes Psychiatry: Judgement and insight appear impaired. Mood & affect appropriate.     Data Reviewed: I  have personally reviewed following labs and imaging studies  CBC: Recent Labs  Lab 06/04/19 2132 06/04/19 2132  06/05/19 0550 06/06/19 0803 06/07/19 0452 06/08/19 0504 06/10/19 0552  WBC 38.0*   < > 35.9* 33.3* 39.5* 39.4* 28.6*  NEUTROABS 3.8  --   --   --   --   --   --   HGB 12.5   < > 12.4 11.2* 12.1 11.4* 10.3*  HCT 41.1   < > 40.0 36.7 36.8 37.1 33.4*  MCV 96.9   < > 95.5 96.8 90.9 94.6 95.2  PLT 173   < > 160 134* 134* 124* 123*   < > = values in this interval not displayed.   Basic Metabolic Panel: Recent Labs  Lab 06/04/19 2132 06/04/19 2132 06/05/19 0550 06/06/19 0803 06/07/19 0452 06/08/19 0504 06/10/19 0552  NA 139   < > 141 139 135 136 136  K 4.4   < > 4.4 4.5 3.8 3.7 3.4*  CL 107   < > 107 110 104 106 105  CO2 24   < > 24 23 20* 20* 22  GLUCOSE 145*   < > 117* 83 77 82 75  BUN 27*   < > 27* 22 16 12 18   CREATININE 1.09*   < > 1.07* 0.91 0.73 0.76 0.80  CALCIUM 8.6*   < > 8.7* 8.1* 8.2* 8.1* 8.2*  MG 2.1  --   --   --   --   --   --    < > = values in this interval not displayed.   GFR: Estimated Creatinine Clearance: 43.8 mL/min (by C-G formula based on SCr of 0.8 mg/dL). Liver Function Tests: Recent Labs  Lab 06/04/19 2132 06/05/19 0550 06/06/19 0803 06/07/19 0452 06/08/19 0504  AST 80* 51* 29 24 19   ALT 26 27 17 15 11   ALKPHOS 172* 169* 128* 113 111  BILITOT 1.5* 0.7 1.1 1.0 1.2  PROT 5.9* 6.1* 5.2* 5.1* 5.1*  ALBUMIN 3.5 3.6 3.0* 2.7* 2.7*   Recent Labs  Lab 06/04/19 2132 06/05/19 1115 06/06/19 0803  LIPASE 747* 194* 31   No results for input(s): AMMONIA in the last 168 hours. Coagulation Profile: No results for input(s): INR, PROTIME in the last 168 hours. Cardiac Enzymes: No results for input(s): CKTOTAL, CKMB, CKMBINDEX, TROPONINI in the last 168 hours. BNP (last 3 results) No results for input(s): PROBNP in the last 8760 hours. HbA1C: No results for input(s): HGBA1C in the last 72 hours. CBG: No results for input(s): GLUCAP in the last 168 hours. Lipid Profile: No results for input(s): CHOL, HDL, LDLCALC, TRIG, CHOLHDL, LDLDIRECT in  the last 72 hours. Thyroid Function Tests: No results for input(s): TSH, T4TOTAL, FREET4, T3FREE, THYROIDAB in the last 72 hours. Anemia Panel: No results for input(s): VITAMINB12, FOLATE, FERRITIN, TIBC, IRON, RETICCTPCT in the last 72 hours. Sepsis Labs: No results for input(s): PROCALCITON, LATICACIDVEN in the last 168 hours.  Recent Results (from the past 240 hour(s))  Urine culture     Status: Abnormal   Collection Time: 06/05/19  2:06 AM   Specimen: Urine, Catheterized  Result Value Ref Range Status   Specimen Description   Final    URINE, CATHETERIZED Performed at [facility] [address], [place], [place] [address]    Special Requests NONE  Final   Culture >=100,000 COLONIES/mL ESCHERICHIA COLI (A)  Final   Report Status 06/07/2019 FINAL  Final   Organism ID, Bacteria ESCHERICHIA COLI (  A)  Final      Susceptibility   Escherichia coli - MIC*    AMPICILLIN >=32 RESISTANT Resistant     CEFAZOLIN 16 SENSITIVE Sensitive     CEFTRIAXONE <=0.25 SENSITIVE Sensitive     CIPROFLOXACIN <=0.25 SENSITIVE Sensitive     GENTAMICIN <=1 SENSITIVE Sensitive     IMIPENEM <=0.25 SENSITIVE Sensitive     NITROFURANTOIN <=16 SENSITIVE Sensitive     TRIMETH/SULFA >=320 RESISTANT Resistant     AMPICILLIN/SULBACTAM >=32 RESISTANT Resistant     PIP/TAZO <=4 SENSITIVE Sensitive     * >=100,000 COLONIES/mL ESCHERICHIA COLI  Respiratory Panel by RT PCR (Flu A&B, Covid) - Nasopharyngeal Swab     Status: None   Collection Time: 06/05/19  4:02 AM   Specimen: Nasopharyngeal Swab  Result Value Ref Range Status   SARS Coronavirus 2 by RT PCR NEGATIVE NEGATIVE Final    Comment: (NOTE) SARS-CoV-2 target nucleic acids are NOT DETECTED. The SARS-CoV-2 RNA is generally detectable in upper respiratoy specimens during the acute phase of infection. The lowest concentration of SARS-CoV-2 viral copies this assay can detect is 131 copies/mL. A negative result does not preclude  SARS-Cov-2 infection and should not be used as the sole basis for treatment or other patient management decisions. A negative result may occur with  improper specimen collection/handling, submission of specimen other than nasopharyngeal swab, presence of viral mutation(s) within the areas targeted by this assay, and inadequate number of viral copies (<131 copies/mL). A negative result must be combined with clinical observations, patient history, and epidemiological information. The expected result is Negative. Fact Sheet for Patients:  PinkCheek.be Fact Sheet for Healthcare Providers:  GravelBags.it This test is not yet ap proved or cleared by the [place] FDA and  has been authorized for detection and/or diagnosis of SARS-CoV-2 by FDA under an Emergency Use Authorization (EUA). This EUA will remain  in effect (meaning this test can be used) for the duration of the COVID-19 declaration under Section 564(b)(1) of the Act, 21 U.S.C. section 360bbb-3(b)(1), unless the authorization is terminated or revoked sooner.    Influenza A by PCR NEGATIVE NEGATIVE Final   Influenza B by PCR NEGATIVE NEGATIVE Final    Comment: (NOTE) The Xpert Xpress SARS-CoV-2/FLU/RSV assay is intended as an aid in  the diagnosis of influenza from Nasopharyngeal swab specimens and  should not be used as a sole basis for treatment. Nasal washings and  aspirates are unacceptable for Xpert Xpress SARS-CoV-2/FLU/RSV  testing. Fact Sheet for Patients: PinkCheek.be Fact Sheet for Healthcare Providers: GravelBags.it This test is not yet approved or cleared by the [place] FDA and  has been authorized for detection and/or diagnosis of SARS-CoV-2 by  FDA under an Emergency Use Authorization (EUA). This EUA will remain  in effect (meaning this test can be used) for the duration of the  Covid-19  declaration under Section 564(b)(1) of the Act, 21  U.S.C. section 360bbb-3(b)(1), unless the authorization is  terminated or revoked. Performed at [facility], [place] [address], [place], [place] [address]          Radiology Studies: CT ABDOMEN PELVIS W CONTRAST  Result Date: 06/09/2019 CLINICAL DATA:  Abdominal abscess/infection suspected. Signs of chronic cholecystitis, cholelithiasis and pancreatitis. Acute diverticulitis on CT 4 days ago. Patient with history of lymphoma. EXAM: CT ABDOMEN AND PELVIS WITH CONTRAST TECHNIQUE: Multidetector CT imaging of the abdomen and pelvis was performed using the standard protocol following bolus administration of intravenous contrast. CONTRAST:  126m  OMNIPAQUE IOHEXOL 300 MG/ML  SOLN COMPARISON:  CT 4 days ago 06/05/2019 FINDINGS: Lower chest: Minimal pleural thickening and dependent atelectasis in the lung bases. Coronary artery calcifications. Hepatobiliary: Scattered hypodensities throughout the liver, largest in the left lobe consistent with simple cyst. Many of these lesions are too small to accurately characterize. Slight decreased gallbladder distension with mild gallbladder wall thickening. Gallstones on prior ultrasound are not well visualized. Mild pericholecystic fat stranding. Prominent common bile duct measuring 10 mm, unchanged. Pancreas: Parenchymal atrophy. Upper normal proximal pancreatic duct at 3 mm. Peripancreatic fat stranding about the head and uncinate process which is new from prior exam. No peripancreatic fluid collection. No evidence of pancreatic necrosis. Spleen: Splenomegaly with spleen measuring 13 cm cranial caudal with hypodense splenic lesions, largest posteriorly measuring 2.6 cm, unchanged in the short interim. Adrenals/Urinary Tract: No adrenal nodule. No hydronephrosis. 2.2 cm right upper pole renal lesion is unchanged in the short interim. Tiny hypodensities in the left kidney are too small to characterize.  Urinary bladder is physiologically distended. No bladder wall thickening. Stomach/Bowel: Nondistended stomach. Normal positioning of the ligament of Treitz. No small bowel obstruction or inflammatory change. Normal appendix. Colonic diverticulosis, extensive in the sigmoid colon. Pericolonic fat stranding and small amount of free fluid adjacent to the sigmoid colon, similar to prior exam. No perforation or abscess. Vascular/Lymphatic: Multifocal adenopathy, most prominent in the retroperitoneum and central mesentery. Findings have not significantly changed in the short interim. Prominent pelvic lymph nodes adjacent to the sigmoid mesocolon which may be reactive or neoplastic. Advanced aortic and branch atherosclerosis. Portal vein is patent. Reproductive: Fluid/low-density in the endometrial canal which measures up to 12 mm, abnormal in a postmenopausal patient. Small calcified fibroid at the fundus. No suspicious adnexal mass. Other: No intra-abdominal abscess. No free air. Trace edema in the pericolic gutters and small amount of free fluid in the pelvis adjacent to colonic inflammation. Musculoskeletal: Unchanged T12 compression fracture. Multilevel degenerative change throughout the spine. No acute osseous abnormalities or change from recent exam. IMPRESSION: 1. New peripancreatic fat stranding about the head and uncinate process consistent with acute pancreatitis. No peripancreatic fluid collection or evidence of pancreatic necrosis. 2. Persistent diverticulitis of the sigmoid colon, not significantly changed over the past 4 days. No abscess or free air. 3. Decreased gallbladder distension with persistent wall enhancement and pericholecystic fat stranding. Stable biliary dilatation. 4. Fluid/low-density in the endometrial canal measuring up to 12 mm, abnormal in a postmenopausal patient. Consider nonemergent pelvic ultrasound for characterization. 5. Abdominopelvic adenopathy, splenomegaly with splenic lesions,  and right upper pole renal lesion, stable in the short interim. Aortic Atherosclerosis (ICD10-I70.0). Electronically Signed   By: Keith Rake M.D.   On: 06/09/2019 15:56        Scheduled Meds: . amLODipine  10 mg Oral Daily  . aspirin EC  81 mg Oral Daily  . heparin  5,000 Units Subcutaneous Q8H  . hydrALAZINE  25 mg Oral TID  . mouth rinse  15 mL Mouth Rinse BID  . metoprolol tartrate  25 mg Oral BID  . montelukast  10 mg Oral QHS  . OLANZapine  2.5 mg Oral QHS  . pantoprazole  40 mg Oral Daily  . saccharomyces boulardii  250 mg Oral BID  . sertraline  100 mg Oral Daily   Continuous Infusions: . piperacillin-tazobactam (ZOSYN)  IV 3.375 g (06/10/19 0951)     LOS: 5 days     Cordelia Poche, MD [facility] 06/10/2019, 10:24 AM  If  7PM-7AM, please contact night-coverage [email or web address]

## 2019-06-10 NOTE — Progress Notes (Signed)
Physical Therapy Treatment Patient Details Name: Carla Conway MRN: [id number] DOB: May 26, 1926 Today's Date: 06/10/2019    History of Present Illness 86 yo female admitted with cholecystitis, pancreatitis. Hx of CLL, dementia, COPD, CKD, syncope    PT Comments    Pt progressing slowly; may need SNF if her ALF is unable to manage her at her current status. Will continue to follow in acute setting   Follow Up Recommendations  SNF(unless ALF can manage current level, then HHPT )     Equipment Recommendations  None recommended by PT    Recommendations for Other Services       Precautions / Restrictions Precautions Precautions: Fall Precaution Comments: dizziness Restrictions Weight Bearing Restrictions: No    Mobility  Bed Mobility Overal bed mobility: Needs Assistance Bed Mobility: Supine to Sit;Sit to Supine     Supine to sit: Min assist Sit to supine: Min assist   General bed mobility comments: assist with trunk to upright, repeated attempts d/t posterior LOB,  assist to bring LEs on to bed  Transfers Overall transfer level: Needs assistance Equipment used: Rolling walker (2 wheeled);2 person hand held assist Transfers: Sit to/from [facility] Sit to Stand: Mod assist;+2 physical assistance;+2 safety/equipment;Min assist Stand pivot transfers: Mod assist;Min assist;+2 physical assistance;+2 safety/equipment       General transfer comment: assist to rise and control descent. incr time, cues for trunk/knee extension.  repeated sit to stands for peri-care and positioning in bed (NT present and assisting)  Ambulation/Gait             General Gait Details: pivotal steps bed <> [facility]   Stairs             Wheelchair Mobility    Modified Rankin (Stroke Patients Only)       Balance             Standing balance-Leahy Scale: Poor                              Cognition Arousal/Alertness: Awake/alert Behavior During  Therapy: WFL for tasks assessed/performed Overall Cognitive Status: History of cognitive impairments - at baseline                                        Exercises      General Comments        Pertinent Vitals/Pain Pain Assessment: Faces Faces Pain Scale: No hurt Pain Intervention(s): Monitored during session    Home Living                      Prior Function            PT Goals (current goals can now be found in the care plan section) Acute Rehab PT Goals Patient Stated Goal: less pain PT Goal Formulation: With patient Time For Goal Achievement: 06/20/19 Potential to Achieve Goals: Fair Progress towards PT goals: Progressing toward goals    Frequency    Min 3X/week      PT Plan Current plan remains appropriate;Discharge plan needs to be updated    Co-evaluation              AM-PAC PT "6 Clicks" Mobility   Outcome Measure  Help needed turning from your back to your side while in a flat bed without using bedrails?: A Little  Help needed moving from lying on your back to sitting on the side of a flat bed without using bedrails?: A Little Help needed moving to and from a bed to a chair (including a wheelchair)?: A Lot Help needed standing up from a chair using your arms (e.g., wheelchair or bedside chair)?: A Lot Help needed to walk in hospital room?: Total Help needed climbing 3-5 steps with a railing? : Total 6 Click Score: 12    End of Session Equipment Utilized During Treatment: Gait belt Activity Tolerance: Patient tolerated treatment well Patient left: in bed;with call bell/phone within reach;with bed alarm set;with nursing/sitter in room   PT Visit Diagnosis: Muscle weakness (generalized) (M62.81);Difficulty in walking, not elsewhere classified (R26.2)     Time: 1351-1420 PT Time Calculation (min) (ACUTE ONLY): 29 min  Charges:  $Therapeutic Activity: 23-37 mins                     Devynn Hessler, PT   Acute Rehab Dept  [place]): [phone]   06/10/2019    [facility] 06/10/2019, 2:42 PM

## 2019-06-11 DIAGNOSIS — E43 Unspecified severe protein-calorie malnutrition: Secondary | ICD-10-CM | POA: Insufficient documentation

## 2019-06-11 LAB — COMPREHENSIVE METABOLIC PANEL
ALT: 9 U/L (ref 0–44)
AST: 21 U/L (ref 15–41)
Albumin: 2.6 g/dL — ABNORMAL LOW (ref 3.5–5.0)
Alkaline Phosphatase: 85 U/L (ref 38–126)
Anion gap: 9 (ref 5–15)
BUN: 17 mg/dL (ref 8–23)
CO2: 23 mmol/L (ref 22–32)
Calcium: 8.2 mg/dL — ABNORMAL LOW (ref 8.9–10.3)
Chloride: 106 mmol/L (ref 98–111)
Creatinine, Ser: 0.76 mg/dL (ref 0.44–1.00)
GFR calc Af Amer: >60 mL/min (ref >60)
GFR calc non Af Amer: >60 mL/min (ref >60)
Glucose, Bld: 90 mg/dL (ref 70–99)
Potassium: 3.2 mmol/L — ABNORMAL LOW (ref 3.5–5.1)
Sodium: 138 mmol/L (ref 135–145)
Total Bilirubin: 0.6 mg/dL (ref 0.3–1.2)
Total Protein: 4.7 g/dL — ABNORMAL LOW (ref 6.5–8.1)

## 2019-06-11 LAB — URINALYSIS, ROUTINE W REFLEX MICROSCOPIC
Bilirubin Urine: NEGATIVE
Glucose, UA: NEGATIVE mg/dL
Hgb urine dipstick: NEGATIVE
Ketones, ur: 5 mg/dL — AB
Leukocytes,Ua: NEGATIVE
Nitrite: NEGATIVE
Protein, ur: 30 mg/dL — AB
Specific Gravity, Urine: 1.02 (ref 1.005–1.030)
pH: 6 (ref 5.0–8.0)

## 2019-06-11 LAB — CBC
HCT: 33.2 % — ABNORMAL LOW (ref 36.0–46.0)
Hemoglobin: 10.2 g/dL — ABNORMAL LOW (ref 12.0–15.0)
MCH: 29.8 pg (ref 26.0–34.0)
MCHC: 30.7 g/dL (ref 30.0–36.0)
MCV: 97.1 fL (ref 80.0–100.0)
Platelets: 124 10*3/uL — ABNORMAL LOW (ref 150–400)
RBC: 3.42 MIL/uL — ABNORMAL LOW (ref 3.87–5.11)
RDW: 17.9 % — ABNORMAL HIGH (ref 11.5–15.5)
WBC: 30.1 10*3/uL — ABNORMAL HIGH (ref 4.0–10.5)
nRBC: 0 % (ref 0.0–0.2)

## 2019-06-11 LAB — PREALBUMIN: Prealbumin: 6.1 mg/dL — ABNORMAL LOW (ref 18–38)

## 2019-06-11 NOTE — Progress Notes (Signed)
Dr Teryl Lucy notified that patient stated several times that she "just wants to die" "I'm going to go home polish my furniture and kill myself". Dr Teryl Lucy said he would talk to her tomorrow. He did not believe she was in any danger of harming herself. Will continue to monitor.

## 2019-06-11 NOTE — Progress Notes (Signed)
EL:[id number] pain  Subjective: She reports no abdominal pain or cramps today    Objective: Vital signs in last 24 hours: Temp:  [97.7 F (36.5 C)-97.8 F (36.6 C)] 97.8 F (36.6 C) (03/06 2585) Pulse Rate:  [59-69] 69 (03/06 0613) Resp:  [14-18] 14 (03/06 0613) BP: (130-162)/(53-93) 162/64 (03/06 0613) SpO2:  [92 %-95 %] 95 % (03/06 0613) Last BM Date: (PTA ) 810 PO Urine 700 She remains afebrile VSS K+ 3.4, WBC is down to 28.6 H/H 10/33 Platelets down 123 CT scan 06/09/2019: New peripancreatic fat stranding around the head and uncinate process consistent with acute pancreatitis.  No evidence of peripancreatic fluid or pancreatic necrosis. Persistent diverticulosis of the sigmoid colon not significantly changed over the last 4 days.  No abscess or free air. Crease gallbladder distention with persistent wall enhancement and pericholecystic fat stranding. Culture >=100,000 COLONIES/mL ESCHERICHIA COLIAbnormal    Report Status 06/07/2019 FINAL   Organism ID, Bacteria ESCHERICHIA COLIAbnormal    Resulting Agency [facility]  Susceptibility   Escherichia coli    MIC    AMPICILLIN >=32 RESIST... Resistant    AMPICILLIN/SULBACTAM >=32 RESIST... Resistant    CEFAZOLIN 16 SENSITIVE  Sensitive    CEFTRIAXONE <=0.25 SENS... Sensitive    CIPROFLOXACIN <=0.25 SENS... Sensitive    GENTAMICIN <=1 SENSITIVE  Sensitive    IMIPENEM <=0.25 SENS... Sensitive    NITROFURANTOIN <=16 SENSIT... Sensitive    PIP/TAZO <=4 SENSITIVE  Sensitive    TRIMETH/SULFA >=320 RESIS... Resistant        Intake/Output from previous day: 03/05 0701 - 03/06 0700 In: 381.2 [P.O.:240; IV Piggyback:141.2] Out: 200 [Urine:200] Intake/Output this shift: No intake/output data recorded.  General appearance: alert, cooperative and no distress Resp: clear to auscultation bilaterally GI: soft, nontender, nondistended without rebound or guarding  Lab Results:  Recent Labs    06/10/19 0552  06/11/19 0522  WBC 28.6* 30.1*  HGB 10.3* 10.2*  HCT 33.4* 33.2*  PLT 123* 124*    BMET Recent Labs    06/10/19 0552 06/11/19 0522  NA 136 138  K 3.4* 3.2*  CL 105 106  CO2 22 23  GLUCOSE 75 90  BUN 18 17  CREATININE 0.80 0.76  CALCIUM 8.2* 8.2*   PT/INR No results for input(s): LABPROT, INR in the last 72 hours.  Recent Labs  Lab 06/05/19 0550 06/06/19 0803 06/07/19 0452 06/08/19 0504 06/11/19 0522  AST 51* 29 24 19 21   ALT 27 17 15 11 9   ALKPHOS 169* 128* 113 111 85  BILITOT 0.7 1.1 1.0 1.2 0.6  PROT 6.1* 5.2* 5.1* 5.1* 4.7*  ALBUMIN 3.6 3.0* 2.7* 2.7* 2.6*     Lipase     Component Value Date/Time   LIPASE 26 06/10/2019 1000     Medications: . amLODipine  10 mg Oral Daily  . aspirin EC  81 mg Oral Daily  . feeding supplement  1 Container Oral BID BM  . feeding supplement (PRO-STAT SUGAR FREE 64)  30 mL Oral BID  . heparin  5,000 Units Subcutaneous Q8H  . hydrALAZINE  25 mg Oral TID  . mouth rinse  15 mL Mouth Rinse BID  . metoprolol tartrate  25 mg Oral BID  . montelukast  10 mg Oral QHS  . multivitamin with minerals  1 tablet Oral Daily  . OLANZapine  2.5 mg Oral QHS  . pantoprazole  40 mg Oral Daily  . protein supplement  2 oz Oral QID  . saccharomyces boulardii  250 mg Oral BID  . sertraline  100 mg Oral Daily    Assessment/Plan Severe dementia Hard of hearing Chronic lymphocytic leukemia Hypertension Hyperlipidemia E coli UTI 2/28 DNR  Abdominal pain Cholelithiasis, possible cholecystitis - improved on CT 3/4 Sigmoid Diverticulitis - ongoing per CT 3/4 Pancreatitis - ongoing  By CT 06/09/19 - WBC 35.9>>33.3>>39.5>> 39.4>> 28.6>> 30.1 -  CLL  FEN: soft diet ID: Maxipime 2/28-3/4Flagyl 2/28-3/4;  Zosyn 3/5 >> day 1 DVT: Heparin Follow-up: TBD  Plan: Regular back or down to full liquids, continue Zosyn.  Continue conservative medical management.   LOS: 6 days    Ileana Roup 06/11/2019 Please see Amion

## 2019-06-11 NOTE — Progress Notes (Signed)
PROGRESS NOTE    Carla Conway  WNU:[id number] DOB: April 22, 1926 DOA: 06/04/2019 PCP: Lujean Amel, MD   Brief Narrative: Carla Conway is a 86 y.o. female with hypertension, hyperlipidemia, CAD, dementia, CLL. Patient presented secondary to abdominal pain and found to have evidence of cholecystitis, pancreatitis and diverticulitis. She was started on empiric antibiotics and general surgery consulted.   Assessment & Plan:  Principal Problem:   Acute cholecystitis Active Problems:   Elevated liver enzymes   Dementia without behavioral disturbance (HCC)   Diverticulitis   Protein-calorie malnutrition, severe   Acute cholecystitis Seen on abdominal ultrasound. Patient is symptomatic.  Alkaline phosphatase and AST/ALT normal. General surgery is on board. Patient is not eating much secondary to pain. -General surgery recommendations: Continue to Zosyn, full liquid diet -Palliative care recommendations: DNR, continue current mode of care; if transitioned to comfort care, residential hospice vs ALF with hospice services  Acute pancreatitis In setting of acute cholecystitis and sludge noted on RUQ ultrasound. No history of pancreatitis in the past. Lipase initially elevated to a peak of 747 and trended down. Worsening symptoms with oral intake. Repeat CT scan shows pancreatitis and symptoms currently seem consistent with acute pancreatitis. Symptomatically improved. -Continue morphine prn  Diverticulitis On Cefepime IV which was transitioned to Ceftriaxone. Now on Zosyn as mentioned above -Continue antibiotics as above  CLL Patient with significant leukocytosis (lymphocyte dominant on previous differential). Patient is on prednisone as an outpatient and follows with oncology. She was on prednisone 5 mg daily which has been held secondary to above infectious processes. Currently not with hospice but rather [facility] Pall[facility].  E. Coli UTI Empirically treated with Cefepime.  Sensitivities available. Switched to Ceftriaxone and now to Zosyn. -Antibiotics as mentioned above  Essential hypertension Better controlled this morning -Continue amlodipine, metoprolol, hydralazine  Dementia Patient with decreased appetite. Possibly related to  -Continue Zyprexa, Zoloft   DVT prophylaxis: Heparin subq Code Status:   Code Status: DNR Family Communication: Daughter at bedside Disposition Plan: Pending general surgery recommendations. Anticipating discharge back to ALF vs hospice   Consultants:   General surgery  Palliative care medicine  Procedures:   None  Antimicrobials:  Cefepime  Ceftriaxone  Zosyn   Subjective: No concerns today  Objective: Vitals:   06/10/19 1320 06/10/19 1643 06/10/19 2041 06/11/19 0613  BP: (!) 130/53 (!) 145/57 (!) 151/53 (!) 162/64  Pulse: 63 (!) 59 62 69  Resp: 18  17 14   Temp: 97.7 F (36.5 C)  97.7 F (36.5 C) 97.8 F (36.6 C)  TempSrc: Oral  Oral Oral  SpO2: 92% 93% 95% 95%  Weight:        Intake/Output Summary (Last 24 hours) at 06/11/2019 1233 Last data filed at 06/11/2019 0617 Gross per 24 hour  Intake 141.21 ml  Output 200 ml  Net -58.79 ml   Filed Weights   06/05/19 0402  Weight: 61.2 kg    Examination:  General exam: Appears calm and comfortable Respiratory system: Clear to auscultation. Respiratory effort normal. Cardiovascular system: S1 & S2 heard, RRR. No murmurs, rubs, gallops or clicks. Gastrointestinal system: Abdomen is nondistended, soft and nontender. No organomegaly or masses felt. Normal bowel sounds heard. Central nervous system: Alert and oriented to person and place Extremities: No edema. No calf tenderness Skin: No cyanosis. No rashes Psychiatry: Judgement and insight appear impaired.    Data Reviewed: I have personally reviewed following labs and imaging studies  CBC: Recent Labs  Lab 06/04/19 2132 06/05/19 0550 06/06/19  4742 06/07/19 0452 06/08/19 0504  06/10/19 0552 06/11/19 0522  WBC 38.0*   < > 33.3* 39.5* 39.4* 28.6* 30.1*  NEUTROABS 3.8  --   --   --   --   --   --   HGB 12.5   < > 11.2* 12.1 11.4* 10.3* 10.2*  HCT 41.1   < > 36.7 36.8 37.1 33.4* 33.2*  MCV 96.9   < > 96.8 90.9 94.6 95.2 97.1  PLT 173   < > 134* 134* 124* 123* 124*   < > = values in this interval not displayed.   Basic Metabolic Panel: Recent Labs  Lab 06/04/19 2132 06/05/19 0550 06/06/19 0803 06/07/19 0452 06/08/19 0504 06/10/19 0552 06/11/19 0522  NA 139   < > 139 135 136 136 138  K 4.4   < > 4.5 3.8 3.7 3.4* 3.2*  CL 107   < > 110 104 106 105 106  CO2 24   < > 23 20* 20* 22 23  GLUCOSE 145*   < > 83 77 82 75 90  BUN 27*   < > 22 16 12 18 17   CREATININE 1.09*   < > 0.91 0.73 0.76 0.80 0.76  CALCIUM 8.6*   < > 8.1* 8.2* 8.1* 8.2* 8.2*  MG 2.1  --   --   --   --   --   --    < > = values in this interval not displayed.   GFR: Estimated Creatinine Clearance: 43.8 mL/min (by C-G formula based on SCr of 0.76 mg/dL). Liver Function Tests: Recent Labs  Lab 06/05/19 0550 06/06/19 0803 06/07/19 0452 06/08/19 0504 06/11/19 0522  AST 51* 29 24 19 21   ALT 27 17 15 11 9   ALKPHOS 169* 128* 113 111 85  BILITOT 0.7 1.1 1.0 1.2 0.6  PROT 6.1* 5.2* 5.1* 5.1* 4.7*  ALBUMIN 3.6 3.0* 2.7* 2.7* 2.6*   Recent Labs  Lab 06/04/19 2132 06/05/19 1115 06/06/19 0803 06/10/19 1000  LIPASE 747* 194* 31 26   No results for input(s): AMMONIA in the last 168 hours. Coagulation Profile: No results for input(s): INR, PROTIME in the last 168 hours. Cardiac Enzymes: No results for input(s): CKTOTAL, CKMB, CKMBINDEX, TROPONINI in the last 168 hours. BNP (last 3 results) No results for input(s): PROBNP in the last 8760 hours. HbA1C: No results for input(s): HGBA1C in the last 72 hours. CBG: No results for input(s): GLUCAP in the last 168 hours. Lipid Profile: No results for input(s): CHOL, HDL, LDLCALC, TRIG, CHOLHDL, LDLDIRECT in the last 72 hours. Thyroid  Function Tests: No results for input(s): TSH, T4TOTAL, FREET4, T3FREE, THYROIDAB in the last 72 hours. Anemia Panel: No results for input(s): VITAMINB12, FOLATE, FERRITIN, TIBC, IRON, RETICCTPCT in the last 72 hours. Sepsis Labs: No results for input(s): PROCALCITON, LATICACIDVEN in the last 168 hours.  Recent Results (from the past 240 hour(s))  Urine culture     Status: Abnormal   Collection Time: 06/05/19  2:06 AM   Specimen: Urine, Catheterized  Result Value Ref Range Status   Specimen Description   Final    URINE, CATHETERIZED Performed at [facility] [address], [place], [place] [address]    Special Requests NONE  Final   Culture >=100,000 COLONIES/mL ESCHERICHIA COLI (A)  Final   Report Status 06/07/2019 FINAL  Final   Organism ID, Bacteria ESCHERICHIA COLI (A)  Final      Susceptibility   Escherichia coli - MIC*  AMPICILLIN >=32 RESISTANT Resistant     CEFAZOLIN 16 SENSITIVE Sensitive     CEFTRIAXONE <=0.25 SENSITIVE Sensitive     CIPROFLOXACIN <=0.25 SENSITIVE Sensitive     GENTAMICIN <=1 SENSITIVE Sensitive     IMIPENEM <=0.25 SENSITIVE Sensitive     NITROFURANTOIN <=16 SENSITIVE Sensitive     TRIMETH/SULFA >=320 RESISTANT Resistant     AMPICILLIN/SULBACTAM >=32 RESISTANT Resistant     PIP/TAZO <=4 SENSITIVE Sensitive     * >=100,000 COLONIES/mL ESCHERICHIA COLI  Respiratory Panel by RT PCR (Flu A&B, Covid) - Nasopharyngeal Swab     Status: None   Collection Time: 06/05/19  4:02 AM   Specimen: Nasopharyngeal Swab  Result Value Ref Range Status   SARS Coronavirus 2 by RT PCR NEGATIVE NEGATIVE Final    Comment: (NOTE) SARS-CoV-2 target nucleic acids are NOT DETECTED. The SARS-CoV-2 RNA is generally detectable in upper respiratoy specimens during the acute phase of infection. The lowest concentration of SARS-CoV-2 viral copies this assay can detect is 131 copies/mL. A negative result does not preclude SARS-Cov-2 infection and should not be  used as the sole basis for treatment or other patient management decisions. A negative result may occur with  improper specimen collection/handling, submission of specimen other than nasopharyngeal swab, presence of viral mutation(s) within the areas targeted by this assay, and inadequate number of viral copies (<131 copies/mL). A negative result must be combined with clinical observations, patient history, and epidemiological information. The expected result is Negative. Fact Sheet for Patients:  PinkCheek.be Fact Sheet for Healthcare Providers:  GravelBags.it This test is not yet ap proved or cleared by the [place] FDA and  has been authorized for detection and/or diagnosis of SARS-CoV-2 by FDA under an Emergency Use Authorization (EUA). This EUA will remain  in effect (meaning this test can be used) for the duration of the COVID-19 declaration under Section 564(b)(1) of the Act, 21 U.S.C. section 360bbb-3(b)(1), unless the authorization is terminated or revoked sooner.    Influenza A by PCR NEGATIVE NEGATIVE Final   Influenza B by PCR NEGATIVE NEGATIVE Final    Comment: (NOTE) The Xpert Xpress SARS-CoV-2/FLU/RSV assay is intended as an aid in  the diagnosis of influenza from Nasopharyngeal swab specimens and  should not be used as a sole basis for treatment. Nasal washings and  aspirates are unacceptable for Xpert Xpress SARS-CoV-2/FLU/RSV  testing. Fact Sheet for Patients: PinkCheek.be Fact Sheet for Healthcare Providers: GravelBags.it This test is not yet approved or cleared by the [place] FDA and  has been authorized for detection and/or diagnosis of SARS-CoV-2 by  FDA under an Emergency Use Authorization (EUA). This EUA will remain  in effect (meaning this test can be used) for the duration of the  Covid-19 declaration under Section 564(b)(1) of the  Act, 21  U.S.C. section 360bbb-3(b)(1), unless the authorization is  terminated or revoked. Performed at [facility], [place] [address] [place], [place] [address]          Radiology Studies: CT ABDOMEN PELVIS W CONTRAST  Result Date: 06/09/2019 CLINICAL DATA:  Abdominal abscess/infection suspected. Signs of chronic cholecystitis, cholelithiasis and pancreatitis. Acute diverticulitis on CT 4 days ago. Patient with history of lymphoma. EXAM: CT ABDOMEN AND PELVIS WITH CONTRAST TECHNIQUE: Multidetector CT imaging of the abdomen and pelvis was performed using the standard protocol following bolus administration of intravenous contrast. CONTRAST:  157m OMNIPAQUE IOHEXOL 300 MG/ML  SOLN COMPARISON:  CT 4 days ago 06/05/2019 FINDINGS: Lower chest: Minimal pleural  thickening and dependent atelectasis in the lung bases. Coronary artery calcifications. Hepatobiliary: Scattered hypodensities throughout the liver, largest in the left lobe consistent with simple cyst. Many of these lesions are too small to accurately characterize. Slight decreased gallbladder distension with mild gallbladder wall thickening. Gallstones on prior ultrasound are not well visualized. Mild pericholecystic fat stranding. Prominent common bile duct measuring 10 mm, unchanged. Pancreas: Parenchymal atrophy. Upper normal proximal pancreatic duct at 3 mm. Peripancreatic fat stranding about the head and uncinate process which is new from prior exam. No peripancreatic fluid collection. No evidence of pancreatic necrosis. Spleen: Splenomegaly with spleen measuring 13 cm cranial caudal with hypodense splenic lesions, largest posteriorly measuring 2.6 cm, unchanged in the short interim. Adrenals/Urinary Tract: No adrenal nodule. No hydronephrosis. 2.2 cm right upper pole renal lesion is unchanged in the short interim. Tiny hypodensities in the left kidney are too small to characterize. Urinary bladder is physiologically  distended. No bladder wall thickening. Stomach/Bowel: Nondistended stomach. Normal positioning of the ligament of Treitz. No small bowel obstruction or inflammatory change. Normal appendix. Colonic diverticulosis, extensive in the sigmoid colon. Pericolonic fat stranding and small amount of free fluid adjacent to the sigmoid colon, similar to prior exam. No perforation or abscess. Vascular/Lymphatic: Multifocal adenopathy, most prominent in the retroperitoneum and central mesentery. Findings have not significantly changed in the short interim. Prominent pelvic lymph nodes adjacent to the sigmoid mesocolon which may be reactive or neoplastic. Advanced aortic and branch atherosclerosis. Portal vein is patent. Reproductive: Fluid/low-density in the endometrial canal which measures up to 12 mm, abnormal in a postmenopausal patient. Small calcified fibroid at the fundus. No suspicious adnexal mass. Other: No intra-abdominal abscess. No free air. Trace edema in the pericolic gutters and small amount of free fluid in the pelvis adjacent to colonic inflammation. Musculoskeletal: Unchanged T12 compression fracture. Multilevel degenerative change throughout the spine. No acute osseous abnormalities or change from recent exam. IMPRESSION: 1. New peripancreatic fat stranding about the head and uncinate process consistent with acute pancreatitis. No peripancreatic fluid collection or evidence of pancreatic necrosis. 2. Persistent diverticulitis of the sigmoid colon, not significantly changed over the past 4 days. No abscess or free air. 3. Decreased gallbladder distension with persistent wall enhancement and pericholecystic fat stranding. Stable biliary dilatation. 4. Fluid/low-density in the endometrial canal measuring up to 12 mm, abnormal in a postmenopausal patient. Consider nonemergent pelvic ultrasound for characterization. 5. Abdominopelvic adenopathy, splenomegaly with splenic lesions, and right upper pole renal lesion,  stable in the short interim. Aortic Atherosclerosis (ICD10-I70.0). Electronically Signed   By: Keith Rake M.D.   On: 06/09/2019 15:56        Scheduled Meds: . amLODipine  10 mg Oral Daily  . aspirin EC  81 mg Oral Daily  . feeding supplement  1 Container Oral BID BM  . feeding supplement (PRO-STAT SUGAR FREE 64)  30 mL Oral BID  . heparin  5,000 Units Subcutaneous Q8H  . hydrALAZINE  25 mg Oral TID  . mouth rinse  15 mL Mouth Rinse BID  . metoprolol tartrate  25 mg Oral BID  . montelukast  10 mg Oral QHS  . multivitamin with minerals  1 tablet Oral Daily  . OLANZapine  2.5 mg Oral QHS  . pantoprazole  40 mg Oral Daily  . protein supplement  2 oz Oral QID  . saccharomyces boulardii  250 mg Oral BID  . sertraline  100 mg Oral Daily   Continuous Infusions: . piperacillin-tazobactam (ZOSYN)  IV 3.375  g (06/11/19 1004)     LOS: 6 days     Cordelia Poche, MD [facility] 06/11/2019, 12:33 PM  If 7PM-7AM, please contact night-coverage [email or web address]

## 2019-06-11 NOTE — Progress Notes (Signed)
Consult was placed to IV Therapy to restart an iv that the pt pulled out;  Pt with extremely poor peripheral access;  Multiple bruises noted;  Able to place a 22ga catheter;  RN has wrapped the site w gauze;  Also suggest placing mittens so pt won't pull this line out;  Raynelle Fanning RN IV Team

## 2019-06-12 DIAGNOSIS — E43 Unspecified severe protein-calorie malnutrition: Secondary | ICD-10-CM

## 2019-06-12 MED ORDER — LACTATED RINGERS IV SOLN: INTRAVENOUS | Status: DC

## 2019-06-12 MED ORDER — ONDANSETRON HCL 4 MG/2ML IJ SOLN
4.0000 mg | Freq: Four times a day (QID) | INTRAMUSCULAR | Status: DC | PRN
  Administered 2019-06-12: 4 mg via INTRAVENOUS
  Filled 2019-06-12: qty 2

## 2019-06-12 NOTE — Progress Notes (Signed)
ZO:[id number] pain  Subjective: She reports no abdominal pain or cramps today  Objective: Vital signs in last 24 hours: Temp:  [97.6 F (36.4 C)-98.1 F (36.7 C)] 98.1 F (36.7 C) (03/07 0542) Pulse Rate:  [62-65] 62 (03/07 0542) Resp:  [17-20] 18 (03/07 0542) BP: (122-128)/(62-80) 128/62 (03/07 0542) SpO2:  [93 %-94 %] 93 % (03/07 0542) Last BM Date: 06/10/19 810 PO Urine 700 She remains afebrile VSS K+ 3.4, WBC is down to 28.6 H/H 10/33 Platelets down 123 CT scan 06/09/2019: New peripancreatic fat stranding around the head and uncinate process consistent with acute pancreatitis.  No evidence of peripancreatic fluid or pancreatic necrosis. Persistent diverticulosis of the sigmoid colon not significantly changed over the last 4 days.  No abscess or free air. Crease gallbladder distention with persistent wall enhancement and pericholecystic fat stranding. Culture >=100,000 COLONIES/mL ESCHERICHIA COLIAbnormal    Report Status 06/07/2019 FINAL   Organism ID, Bacteria ESCHERICHIA COLIAbnormal    Resulting Agency [facility]  Susceptibility   Escherichia coli    MIC    AMPICILLIN >=32 RESIST... Resistant    AMPICILLIN/SULBACTAM >=32 RESIST... Resistant    CEFAZOLIN 16 SENSITIVE  Sensitive    CEFTRIAXONE <=0.25 SENS... Sensitive    CIPROFLOXACIN <=0.25 SENS... Sensitive    GENTAMICIN <=1 SENSITIVE  Sensitive    IMIPENEM <=0.25 SENS... Sensitive    NITROFURANTOIN <=16 SENSIT... Sensitive    PIP/TAZO <=4 SENSITIVE  Sensitive    TRIMETH/SULFA >=320 RESIS... Resistant        Intake/Output from previous day: 03/06 0701 - 03/07 0700 In: 218.8 [P.O.:60; IV Piggyback:158.8] Out: 800 [Urine:800] Intake/Output this shift: No intake/output data recorded.  General appearance: alert, cooperative and no distress Resp: clear to auscultation bilaterally GI: soft, nontender, nondistended without rebound or guarding  Lab Results:  Recent Labs    06/10/19 0552 06/11/19 0522   WBC 28.6* 30.1*  HGB 10.3* 10.2*  HCT 33.4* 33.2*  PLT 123* 124*    BMET Recent Labs    06/10/19 0552 06/11/19 0522  NA 136 138  K 3.4* 3.2*  CL 105 106  CO2 22 23  GLUCOSE 75 90  BUN 18 17  CREATININE 0.80 0.76  CALCIUM 8.2* 8.2*   PT/INR No results for input(s): LABPROT, INR in the last 72 hours.  Recent Labs  Lab 06/06/19 0803 06/07/19 0452 06/08/19 0504 06/11/19 0522  AST 29 24 19 21   ALT 17 15 11 9   ALKPHOS 128* 113 111 85  BILITOT 1.1 1.0 1.2 0.6  PROT 5.2* 5.1* 5.1* 4.7*  ALBUMIN 3.0* 2.7* 2.7* 2.6*     Lipase     Component Value Date/Time   LIPASE 26 06/10/2019 1000     Medications: . amLODipine  10 mg Oral Daily  . aspirin EC  81 mg Oral Daily  . feeding supplement  1 Container Oral BID BM  . feeding supplement (PRO-STAT SUGAR FREE 64)  30 mL Oral BID  . heparin  5,000 Units Subcutaneous Q8H  . hydrALAZINE  25 mg Oral TID  . mouth rinse  15 mL Mouth Rinse BID  . metoprolol tartrate  25 mg Oral BID  . montelukast  10 mg Oral QHS  . multivitamin with minerals  1 tablet Oral Daily  . OLANZapine  2.5 mg Oral QHS  . pantoprazole  40 mg Oral Daily  . protein supplement  2 oz Oral QID  . saccharomyces boulardii  250 mg Oral BID  . sertraline  100 mg  Oral Daily    Assessment/Plan Severe dementia Hard of hearing Chronic lymphocytic leukemia Hypertension Hyperlipidemia E coli UTI 2/28 DNR  Abdominal pain Cholelithiasis, possible cholecystitis - improved on CT 3/4 Sigmoid Diverticulitis - ongoing per CT 3/4 Pancreatitis - ongoing  By CT 06/09/19 - WBC 35.9>>33.3>>39.5>> 39.4>> 28.6>> 30.1 -  CLL  FEN: soft diet ID: Maxipime 2/28-3/4Flagyl 2/28-3/4;  Zosyn 3/5 >> day 2 DVT: Heparin Follow-up: TBD  Plan: Can de-escalate to PO abx to complete 14d course.  Continue conservative medical management.  I discussed everything over the phone with her daughter yesterday whom was agreement with the plan of care moving forward  We remain  available if questions or concerns arise   LOS: 7 days    Ileana Roup 06/12/2019 Please see Amion

## 2019-06-12 NOTE — Progress Notes (Signed)
Daily Progress Note   Patient Name: Carla Conway [facility]       Date: 06/12/2019 DOB: 08-31-1926  Age: 86 y.o. MRN#: [id number] Attending Physician: Mariel Aloe, MD Primary Care Physician: Lujean Amel, MD Admit Date: 06/04/2019  Reason for Consultation/Follow-up: Establishing goals of care  Subjective:  awakens easily, resting in bed. Almost nil PO intake. Continue to monitor, no family at bedside, discussed with [facility] MD, see below.   Length of Stay: 7  Current Medications: Scheduled Meds:  . amLODipine  10 mg Oral Daily  . aspirin EC  81 mg Oral Daily  . feeding supplement  1 Container Oral BID BM  . feeding supplement (PRO-STAT SUGAR FREE 64)  30 mL Oral BID  . heparin  5,000 Units Subcutaneous Q8H  . hydrALAZINE  25 mg Oral TID  . mouth rinse  15 mL Mouth Rinse BID  . metoprolol tartrate  25 mg Oral BID  . montelukast  10 mg Oral QHS  . multivitamin with minerals  1 tablet Oral Daily  . OLANZapine  2.5 mg Oral QHS  . pantoprazole  40 mg Oral Daily  . protein supplement  2 oz Oral QID  . saccharomyces boulardii  250 mg Oral BID  . sertraline  100 mg Oral Daily    Continuous Infusions: . piperacillin-tazobactam (ZOSYN)  IV 3.375 g (06/12/19 1030)    PRN Meds: acetaminophen **OR** acetaminophen, hydrALAZINE, morphine injection, ondansetron (ZOFRAN) IV  Physical Exam         Weak appearing elderly lady Hard of hearing No distress Abdomen not distended S1 S2 Regular work of breathing Dementia  Vital Signs: BP 128/62   Pulse 62   Temp 98.1 F (36.7 C) (Oral)   Resp 18   Wt 61.2 kg   SpO2 93%   BMI 21.79 kg/m  SpO2: SpO2: 93 % O2 Device: O2 Device: Room Air O2 Flow Rate: O2 Flow Rate (L/min): 0 L/min  Intake/output summary:   Intake/Output Summary (Last 24  hours) at 06/12/2019 1334 Last data filed at 06/12/2019 2197 Gross per 24 hour  Intake 218.83 ml  Output 800 ml  Net -581.17 ml   LBM: Last BM Date: 06/10/19 Baseline Weight: Weight: 61.2 kg Most recent weight: Weight: 61.2 kg       Palliative Assessment/Data:  Patient Active Problem List   Diagnosis Date Noted  . Protein-calorie malnutrition, severe 06/11/2019  . Acute cholecystitis 06/05/2019  . Dementia without behavioral disturbance ([place]) 06/05/2019  . Diverticulitis 06/05/2019  . Preventive measure 12/29/2017  . Other fatigue 09/25/2017  . Malnutrition of moderate degree 07/20/2017  . Enterocolitis 07/18/2017  . Pancytopenia, acquired ([place]) 07/14/2017  . Malignant cachexia ([place]) 06/04/2017  . Hypokalemia 04/29/2017  . Left arm pain 04/29/2017  . TIA (transient ischemic attack) 04/29/2017  . Abdominal pain 04/28/2017  . Elevated liver enzymes 04/01/2017  . AKI (acute kidney injury) ([place]) 03/31/2017  . Dehydration 03/31/2017  . Generalized weakness 03/31/2017  . External hemorrhoids without complication 48/04/6551  . Port-A-Cath in place 01/06/2017  . Gastritis 01/06/2017  . Poor memory 12/11/2016  . Dysuria 11/13/2016  . Anaphylaxis 10/29/2016  . Anaphylactoid reaction 10/29/2016  . Goals of care, counseling/discussion 10/28/2016  . Chronic kidney disease, stage III (moderate) ([place]) 10/17/2016  . 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 Neutropenia ([place]) 09/23/2016  . CLL (chronic lymphocytic leukemia) ([place]) 05/11/2016  . Acute on chronic diastolic heart failure ([place]) 05/10/2016  . Influenza A 05/09/2016  . Lymphadenopathy of head and neck region 05/09/2016  . Lymphadenopathy, thoracic 05/09/2016  . COPD (chronic obstructive pulmonary disease) ([place]) 05/09/2016  . Debility 05/09/2016  . Syncopal episodes 05/09/2016  . Syncope 05/08/2016  . HTN (hypertension) 05/18/2013  . CAD in native artery 05/18/2013    Palliative Care Assessment & Plan    Patient Profile:    Assessment:  86 yo with abdominal pain, cholelithiasis, possible cholecystitis, sigmoid diverticulitis, pancreatitis. General surgery is following, serial imaging of the abdomen is being done, antibiotic trial and medical management is ongoing Dementia Minimal PO intake Generalized deconditioning and functional decline.   Recommendations/Plan:   continue to monitor hospital course and overall disease trajectory. Would recommend residential hospice if no significant meaningful chance of recovery towards the end of this hospitalization/time limited trial of current interventions. PMT to follow.    Code Status:    Code Status Orders  (From admission, onward)         Start     Ordered   06/05/19 0516  Do not attempt resuscitation (DNR)  Continuous    Question Answer Comment  In the event of cardiac or respiratory ARREST Do not call a "code blue"   In the event of cardiac or respiratory ARREST Do not perform Intubation, CPR, defibrillation or ACLS   In the event of cardiac or respiratory ARREST Use medication by any route, position, wound care, and other measures to relive pain and suffering. May use oxygen, suction and manual treatment of airway obstruction as needed for comfort.   Comments I personally called the patient's daughter in the presence of ED nurse who is taking care of the patient and patient 's daughter clearly mentioned that patient is DNR      06/05/19 0516        Code Status History    Date Active Date Inactive Code Status Order ID Comments User Context   06/05/2019 0441 06/05/2019 0515 Full Code [id number]  Edmonia Lynch, DO ED   11/21/2017 1643 11/23/2017 1708 Partial Code [id number]  Damita Lack, MD ED   07/18/2017 1313 07/23/2017 1905 Full Code [id number]  Elodia Florence., MD Inpatient   04/29/2017 1812 05/01/2017 1952 Full Code [id number]  Barton Dubois, MD ED   03/31/2017 2208 04/03/2017 2108 Full Code [id number]  Etta Quill,  DO ED  10/29/2016 1534 10/30/2016 1634 Full Code [id number]  Elmarie Shiley, MD Inpatient   05/09/2016 0049 05/11/2016 1904 Full Code [id number]  Roney Jaffe, MD Inpatient   Advance Care Planning Activity       Prognosis:   guarded   Discharge Planning:  To Be Determined  Care plan was discussed with [facility] MD, patient.   Thank you for allowing the Palliative Medicine Team to assist in the care of this patient.   Time In: 1300 Time Out: 1325 Total Time 25 Prolonged Time Billed  no       Greater than 50%  of this time was spent counseling and coordinating care related to the above assessment and plan.  Loistine Chance, MD  Please contact Palliative Medicine Team phone at [phone] for questions and concerns.

## 2019-06-12 NOTE — Progress Notes (Signed)
Skin tear on left forearm was changed today with petroleum gauze and wrapped with kerlex.

## 2019-06-12 NOTE — Progress Notes (Addendum)
Pt has had very little urine output this shift, approx 50cc in purewick canister since 2200, tea colored. No episodes of incontinence. Bladder scan revealing only 39cc. Poor PO intake, pt will take a sip if offered but otherwise has slept most of the shift. Notifying on call provider, will await response. Hortencia Conradi RN

## 2019-06-12 NOTE — Progress Notes (Signed)
PROGRESS NOTE    Carla Conway  YYT:[id number] DOB: November 16, 1926 DOA: 06/04/2019 PCP: Lujean Amel, MD   Brief Narrative: Carla Conway is a 86 y.o. female with hypertension, hyperlipidemia, CAD, dementia, CLL. Patient presented secondary to abdominal pain and found to have evidence of cholecystitis, pancreatitis and diverticulitis. She was started on empiric antibiotics and general surgery consulted.   Assessment & Plan:  Principal Problem:   Acute cholecystitis Active Problems:   Elevated liver enzymes   Dementia without behavioral disturbance (HCC)   Diverticulitis   Protein-calorie malnutrition, severe   Acute cholecystitis Seen on abdominal ultrasound. Patient is symptomatic.  Alkaline phosphatase and AST/ALT normal. General surgery is on board. Patient is not eating much secondary to pain. -General surgery recommendations: conservative management -Palliative care recommendations: DNR, continue current mode of care; if transitioned to comfort care, residential hospice vs ALF with hospice services  Acute pancreatitis In setting of acute cholecystitis and sludge noted on RUQ ultrasound. No history of pancreatitis in the past. Lipase initially elevated to a peak of 747 and trended down. Worsening symptoms with oral intake. Repeat CT scan shows pancreatitis and symptoms currently seem consistent with acute pancreatitis. Symptomatically improved however, today she had some worsening pain with PO intake -Continue morphine prn -LR fluids  Diverticulitis On Cefepime IV which was transitioned to Ceftriaxone. Now on Zosyn as mentioned above -Continue antibiotics as above  CLL Patient with significant leukocytosis (lymphocyte dominant on previous differential). Patient is on prednisone as an outpatient and follows with oncology. She was on prednisone 5 mg daily which has been held secondary to above infectious processes. Currently not with hospice but rather [facility] Palliative  Care.  E. Coli UTI Empirically treated with Cefepime. Sensitivities available. Switched to Ceftriaxone and now to Zosyn. -Antibiotics as mentioned above  Essential hypertension Better controlled this morning -Continue amlodipine, metoprolol, hydralazine  Dementia Patient with decreased appetite. Possibly related to  -Continue Zyprexa, Zoloft   DVT prophylaxis: Heparin subq Code Status:   Code Status: DNR Family Communication: None at bedside Disposition Plan: Pending general surgery recommendations. Anticipating discharge back to ALF vs hospice   Consultants:   General surgery  Palliative care medicine  Procedures:   None  Antimicrobials:  Cefepime  Ceftriaxone  Zosyn   Subjective: No issues today. Discussed if she wanted to kill herself and she stated no.  Objective: Vitals:   06/11/19 0613 06/11/19 1312 06/11/19 2203 06/12/19 0542  BP: (!) 162/64 124/65 122/80 128/62  Pulse: 69 62 65 62  Resp: 14 20 17 18   Temp: 97.8 F (36.6 C) 97.6 F (36.4 C) 97.6 F (36.4 C) 98.1 F (36.7 C)  TempSrc: Oral Oral Oral Oral  SpO2: 95% 93% 94% 93%  Weight:        Intake/Output Summary (Last 24 hours) at 06/12/2019 1345 Last data filed at 06/12/2019 2751 Gross per 24 hour  Intake 218.83 ml  Output 800 ml  Net -581.17 ml   Filed Weights   06/05/19 0402  Weight: 61.2 kg    Examination:  General exam: Appears calm and comfortable Respiratory system: Clear to auscultation. Respiratory effort normal. Cardiovascular system: S1 & S2 heard, RRR. No murmurs, rubs, gallops or clicks. Gastrointestinal system: Abdomen is nondistended, soft and nontender. No organomegaly or masses felt. Normal bowel sounds heard. Central nervous system: Alert and oriented. Extremities: No edema. No calf tenderness Skin: No cyanosis. No rashes Psychiatry: Judgement and insight appear impaired. No suicidal ideation    Data Reviewed: I have personally  reviewed following labs and  imaging studies  CBC: Recent Labs  Lab 06/06/19 0803 06/07/19 0452 06/08/19 0504 06/10/19 0552 06/11/19 0522  WBC 33.3* 39.5* 39.4* 28.6* 30.1*  HGB 11.2* 12.1 11.4* 10.3* 10.2*  HCT 36.7 36.8 37.1 33.4* 33.2*  MCV 96.8 90.9 94.6 95.2 97.1  PLT 134* 134* 124* 123* 627*   Basic Metabolic Panel: Recent Labs  Lab 06/06/19 0803 06/07/19 0452 06/08/19 0504 06/10/19 0552 06/11/19 0522  NA 139 135 136 136 138  K 4.5 3.8 3.7 3.4* 3.2*  CL 110 104 106 105 106  CO2 23 20* 20* 22 23  GLUCOSE 83 77 82 75 90  BUN 22 16 12 18 17   CREATININE 0.91 0.73 0.76 0.80 0.76  CALCIUM 8.1* 8.2* 8.1* 8.2* 8.2*   GFR: Estimated Creatinine Clearance: 43.8 mL/min (by C-G formula based on SCr of 0.76 mg/dL). Liver Function Tests: Recent Labs  Lab 06/06/19 0803 06/07/19 0452 06/08/19 0504 06/11/19 0522  AST 29 24 19 21   ALT 17 15 11 9   ALKPHOS 128* 113 111 85  BILITOT 1.1 1.0 1.2 0.6  PROT 5.2* 5.1* 5.1* 4.7*  ALBUMIN 3.0* 2.7* 2.7* 2.6*   Recent Labs  Lab 06/06/19 0803 06/10/19 1000  LIPASE 31 26   No results for input(s): AMMONIA in the last 168 hours. Coagulation Profile: No results for input(s): INR, PROTIME in the last 168 hours. Cardiac Enzymes: No results for input(s): CKTOTAL, CKMB, CKMBINDEX, TROPONINI in the last 168 hours. BNP (last 3 results) No results for input(s): PROBNP in the last 8760 hours. HbA1C: No results for input(s): HGBA1C in the last 72 hours. CBG: No results for input(s): GLUCAP in the last 168 hours. Lipid Profile: No results for input(s): CHOL, HDL, LDLCALC, TRIG, CHOLHDL, LDLDIRECT in the last 72 hours. Thyroid Function Tests: No results for input(s): TSH, T4TOTAL, FREET4, T3FREE, THYROIDAB in the last 72 hours. Anemia Panel: No results for input(s): VITAMINB12, FOLATE, FERRITIN, TIBC, IRON, RETICCTPCT in the last 72 hours. Sepsis Labs: No results for input(s): PROCALCITON, LATICACIDVEN in the last 168 hours.  Recent Results (from the past 240  hour(s))  Urine culture     Status: Abnormal   Collection Time: 06/05/19  2:06 AM   Specimen: Urine, Catheterized  Result Value Ref Range Status   Specimen Description   Final    URINE, CATHETERIZED Performed at [facility] [address] [place], [place] [address]    Special Requests NONE  Final   Culture >=100,000 COLONIES/mL ESCHERICHIA COLI (A)  Final   Report Status 06/07/2019 FINAL  Final   Organism ID, Bacteria ESCHERICHIA COLI (A)  Final      Susceptibility   Escherichia coli - MIC*    AMPICILLIN >=32 RESISTANT Resistant     CEFAZOLIN 16 SENSITIVE Sensitive     CEFTRIAXONE <=0.25 SENSITIVE Sensitive     CIPROFLOXACIN <=0.25 SENSITIVE Sensitive     GENTAMICIN <=1 SENSITIVE Sensitive     IMIPENEM <=0.25 SENSITIVE Sensitive     NITROFURANTOIN <=16 SENSITIVE Sensitive     TRIMETH/SULFA >=320 RESISTANT Resistant     AMPICILLIN/SULBACTAM >=32 RESISTANT Resistant     PIP/TAZO <=4 SENSITIVE Sensitive     * >=100,000 COLONIES/mL ESCHERICHIA COLI  Respiratory Panel by RT PCR (Flu A&B, Covid) - Nasopharyngeal Swab     Status: None   Collection Time: 06/05/19  4:02 AM   Specimen: Nasopharyngeal Swab  Result Value Ref Range Status   SARS Coronavirus 2 by RT PCR NEGATIVE NEGATIVE  Final    Comment: (NOTE) SARS-CoV-2 target nucleic acids are NOT DETECTED. The SARS-CoV-2 RNA is generally detectable in upper respiratoy specimens during the acute phase of infection. The lowest concentration of SARS-CoV-2 viral copies this assay can detect is 131 copies/mL. A negative result does not preclude SARS-Cov-2 infection and should not be used as the sole basis for treatment or other patient management decisions. A negative result may occur with  improper specimen collection/handling, submission of specimen other than nasopharyngeal swab, presence of viral mutation(s) within the areas targeted by this assay, and inadequate number of viral copies (<131 copies/mL). A  negative result must be combined with clinical observations, patient history, and epidemiological information. The expected result is Negative. Fact Sheet for Patients:  PinkCheek.be Fact Sheet for Healthcare Providers:  GravelBags.it This test is not yet ap proved or cleared by the [place] FDA and  has been authorized for detection and/or diagnosis of SARS-CoV-2 by FDA under an Emergency Use Authorization (EUA). This EUA will remain  in effect (meaning this test can be used) for the duration of the COVID-19 declaration under Section 564(b)(1) of the Act, 21 U.S.C. section 360bbb-3(b)(1), unless the authorization is terminated or revoked sooner.    Influenza A by PCR NEGATIVE NEGATIVE Final   Influenza B by PCR NEGATIVE NEGATIVE Final    Comment: (NOTE) The Xpert Xpress SARS-CoV-2/FLU/RSV assay is intended as an aid in  the diagnosis of influenza from Nasopharyngeal swab specimens and  should not be used as a sole basis for treatment. Nasal washings and  aspirates are unacceptable for Xpert Xpress SARS-CoV-2/FLU/RSV  testing. Fact Sheet for Patients: PinkCheek.be Fact Sheet for Healthcare Providers: GravelBags.it This test is not yet approved or cleared by the [place] FDA and  has been authorized for detection and/or diagnosis of SARS-CoV-2 by  FDA under an Emergency Use Authorization (EUA). This EUA will remain  in effect (meaning this test can be used) for the duration of the  Covid-19 declaration under Section 564(b)(1) of the Act, 21  U.S.C. section 360bbb-3(b)(1), unless the authorization is  terminated or revoked. Performed at [facility], [place] [address], [place], [place] [address]          Radiology Studies: No results found.      Scheduled Meds: . amLODipine  10 mg Oral Daily  . aspirin EC  81 mg Oral Daily  .  feeding supplement  1 Container Oral BID BM  . feeding supplement (PRO-STAT SUGAR FREE 64)  30 mL Oral BID  . heparin  5,000 Units Subcutaneous Q8H  . hydrALAZINE  25 mg Oral TID  . mouth rinse  15 mL Mouth Rinse BID  . metoprolol tartrate  25 mg Oral BID  . montelukast  10 mg Oral QHS  . multivitamin with minerals  1 tablet Oral Daily  . OLANZapine  2.5 mg Oral QHS  . pantoprazole  40 mg Oral Daily  . protein supplement  2 oz Oral QID  . saccharomyces boulardii  250 mg Oral BID  . sertraline  100 mg Oral Daily   Continuous Infusions: . lactated ringers    . piperacillin-tazobactam (ZOSYN)  IV 3.375 g (06/12/19 1030)     LOS: 7 days     Cordelia Poche, MD [facility] 06/12/2019, 1:45 PM  If 7PM-7AM, please contact night-coverage [email or web address]

## 2019-06-13 DIAGNOSIS — F0390 Unspecified dementia without behavioral disturbance: Secondary | ICD-10-CM

## 2019-06-13 DIAGNOSIS — R1084 Generalized abdominal pain: Secondary | ICD-10-CM

## 2019-06-13 LAB — BASIC METABOLIC PANEL
Anion gap: 7 (ref 5–15)
BUN: 13 mg/dL (ref 8–23)
CO2: 28 mmol/L (ref 22–32)
Calcium: 8.1 mg/dL — ABNORMAL LOW (ref 8.9–10.3)
Chloride: 107 mmol/L (ref 98–111)
Creatinine, Ser: 0.89 mg/dL (ref 0.44–1.00)
GFR calc Af Amer: >60 mL/min (ref >60)
GFR calc non Af Amer: 57 mL/min — ABNORMAL LOW (ref >60)
Glucose, Bld: 91 mg/dL (ref 70–99)
Potassium: 2.9 mmol/L — ABNORMAL LOW (ref 3.5–5.1)
Sodium: 142 mmol/L (ref 135–145)

## 2019-06-13 LAB — CBC
HCT: 32 % — ABNORMAL LOW (ref 36.0–46.0)
Hemoglobin: 10 g/dL — ABNORMAL LOW (ref 12.0–15.0)
MCH: 30 pg (ref 26.0–34.0)
MCHC: 31.3 g/dL (ref 30.0–36.0)
MCV: 96.1 fL (ref 80.0–100.0)
Platelets: 110 10*3/uL — ABNORMAL LOW (ref 150–400)
RBC: 3.33 MIL/uL — ABNORMAL LOW (ref 3.87–5.11)
RDW: 17.8 % — ABNORMAL HIGH (ref 11.5–15.5)
WBC: 23.8 10*3/uL — ABNORMAL HIGH (ref 4.0–10.5)
nRBC: 0 % (ref 0.0–0.2)

## 2019-06-13 MED ORDER — ALBUTEROL SULFATE (2.5 MG/3ML) 0.083% IN NEBU
2.5000 mg | INHALATION_SOLUTION | Freq: Once | RESPIRATORY_TRACT | Status: DC
  Filled 2019-06-13: qty 3

## 2019-06-13 MED ORDER — AMOXICILLIN-POT CLAVULANATE 875-125 MG PO TABS
1.0000 | ORAL_TABLET | Freq: Two times a day (BID) | ORAL | Status: DC
  Administered 2019-06-13 – 2019-06-15 (×4): 1 via ORAL
  Filled 2019-06-13 (×4): qty 1

## 2019-06-13 NOTE — Progress Notes (Signed)
Daily Progress Note   Patient Name: [facility]       Date: 06/13/2019 DOB: Feb 14, 1927  Age: 86 y.o. MRN#: [id number] Attending Physician: Mariel Aloe, MD Primary Care Physician: Lujean Amel, MD Admit Date: 06/04/2019  Reason for Consultation/Follow-up: Establishing goals of care  Subjective:  Carla Conway is awake alert sitting in a chair this morning.  She has been advanced to soft foods.  She ate some.  She does not appear to be in distress.    Length of Stay: 8  Current Medications: Scheduled Meds:  . albuterol  2.5 mg Nebulization Once  . amLODipine  10 mg Oral Daily  . amoxicillin-clavulanate  1 tablet Oral Q12H  . aspirin EC  81 mg Oral Daily  . feeding supplement  1 Container Oral BID BM  . feeding supplement (PRO-STAT SUGAR FREE 64)  30 mL Oral BID  . heparin  5,000 Units Subcutaneous Q8H  . mouth rinse  15 mL Mouth Rinse BID  . metoprolol tartrate  25 mg Oral BID  . montelukast  10 mg Oral QHS  . multivitamin with minerals  1 tablet Oral Daily  . OLANZapine  2.5 mg Oral QHS  . pantoprazole  40 mg Oral Daily  . protein supplement  2 oz Oral QID  . saccharomyces boulardii  250 mg Oral BID  . sertraline  100 mg Oral Daily    Continuous Infusions:   PRN Meds: acetaminophen **OR** acetaminophen, hydrALAZINE, morphine injection, ondansetron (ZOFRAN) IV  Physical Exam         Weak appearing elderly lady Hard of hearing No distress Abdomen not distended S1 S2 Regular work of breathing Dementia  Vital Signs: BP 121/62 (BP Location: Left Arm)   Pulse 74   Temp 98.2 F (36.8 C) (Oral)   Resp 16   Wt 61.2 kg   SpO2 91%   BMI 21.79 kg/m  SpO2: SpO2: 91 % O2 Device: O2 Device: Nasal Cannula O2 Flow Rate: O2 Flow Rate (L/min): 2 L/min  Intake/output summary:   Intake/Output Summary (Last 24 hours) at 06/13/2019 1228 Last data filed at 06/13/2019 1042 Gross per 24 hour  Intake 222.41 ml  Output 0 ml  Net 222.41 ml   LBM: Last BM Date: 06/12/19 Baseline Weight: Weight: 61.2 kg Most recent weight: Weight: 61.2 kg  Palliative Assessment/Data:      Patient Active Problem List   Diagnosis Date Noted  . Protein-calorie malnutrition, severe 06/11/2019  . Acute cholecystitis 06/05/2019  . Dementia without behavioral disturbance ([place]) 06/05/2019  . Diverticulitis 06/05/2019  . Preventive measure 12/29/2017  . Other fatigue 09/25/2017  . Malnutrition of moderate degree 07/20/2017  . Enterocolitis 07/18/2017  . Pancytopenia, acquired ([place]) 07/14/2017  . Malignant cachexia ([place]) 06/04/2017  . Hypokalemia 04/29/2017  . Left arm pain 04/29/2017  . TIA (transient ischemic attack) 04/29/2017  . Abdominal pain 04/28/2017  . Elevated liver enzymes 04/01/2017  . AKI (acute kidney injury) ([place]) 03/31/2017  . Dehydration 03/31/2017  . Generalized weakness 03/31/2017  . External hemorrhoids without complication 02/05/5944  . Port-A-Cath in place 01/06/2017  . Gastritis 01/06/2017  . Poor memory 12/11/2016  . Dysuria 11/13/2016  . Anaphylaxis 10/29/2016  . Anaphylactoid reaction 10/29/2016  . Goals of care, counseling/discussion 10/28/2016  . Chronic kidney disease, stage III (moderate) ([place]) 10/17/2016  . Lymphoma, small lymphocytic ([place]) 10/15/2016  . Neutropenia ([place]) 09/23/2016  . CLL (chronic lymphocytic leukemia) ([place]) 05/11/2016  . Acute on chronic diastolic heart failure ([place]) 05/10/2016  . Influenza A 05/09/2016  . Lymphadenopathy of head and neck region 05/09/2016  . Lymphadenopathy, thoracic 05/09/2016  . COPD (chronic obstructive pulmonary disease) ([place]) 05/09/2016  . Debility 05/09/2016  . Syncopal episodes 05/09/2016  . Syncope 05/08/2016  . HTN (hypertension) 05/18/2013  . CAD in native artery 05/18/2013    Palliative  Care Assessment & Plan   Patient Profile:    Assessment:  86 yo with abdominal pain, cholelithiasis, possible cholecystitis, sigmoid diverticulitis, pancreatitis. General surgery is following, serial imaging of the abdomen is being done, antibiotic trial and medical management is ongoing Dementia Minimal PO intake Generalized deconditioning and functional decline.   Recommendations/Plan:   continue to monitor hospital course and overall disease trajectory. Would recommend residential hospice if no significant meaningful chance of recovery towards the end of this hospitalization/time limited trial of current interventions.  Discussed with TRH MD.   Pharmacy is following and antibiotics regimen is being readjusted.  Possible that the patient might be transition to p.o. antibiotics soon.   Code Status:    Code Status Orders  (From admission, onward)         Start     Ordered   06/05/19 0516  Do not attempt resuscitation (DNR)  Continuous    Question Answer Comment  In the event of cardiac or respiratory ARREST Do not call a "code blue"   In the event of cardiac or respiratory ARREST Do not perform Intubation, CPR, defibrillation or ACLS   In the event of cardiac or respiratory ARREST Use medication by any route, position, wound care, and other measures to relive pain and suffering. May use oxygen, suction and manual treatment of airway obstruction as needed for comfort.   Comments I personally called the patient's daughter in the presence of ED nurse who is taking care of the patient and patient 's daughter clearly mentioned that patient is DNR      06/05/19 0516        Code Status History    Date Active Date Inactive Code Status Order ID Comments User Context   06/05/2019 0441 06/05/2019 0515 Full Code [id number]  Edmonia Lynch, DO ED   11/21/2017 1643 11/23/2017 1708 Partial Code [id number]  Damita Lack, MD ED   07/18/2017 1313 07/23/2017 1905 Full Code [id number]  Elodia Florence.,  MD Inpatient   04/29/2017 1812 05/01/2017 1952 Full Code [id number]  Barton Dubois, MD ED   03/31/2017 2208 04/03/2017 2108 Full Code [id number]  Etta Quill, DO ED   10/29/2016 1534 10/30/2016 1634 Full Code [id number]  Elmarie Shiley, MD Inpatient   05/09/2016 0049 05/11/2016 1904 Full Code [id number]  Roney Jaffe, MD Inpatient   Advance Care Planning Activity       Prognosis:   guarded   Discharge Planning:  To Be Determined  Care plan was discussed with IDT  Thank you for allowing the Palliative Medicine Team to assist in the care of this patient.   Time In: 12 Time Out: 12.15 Total Time 15 Prolonged Time Billed  no       Greater than 50%  of this time was spent counseling and coordinating care related to the above assessment and plan.  Loistine Chance, MD  Please contact Palliative Medicine Team phone at [phone] for questions and concerns.

## 2019-06-13 NOTE — Progress Notes (Signed)
Pharmacy Antibiotic Note  Carla Conway is a 86 y.o. female known to pharmacy from abx consults with this admission. She was treated with cefepime/flagyl on admission for acute cholecystitis and possible diverticulits with pancreatitis and de-escalated to ceftriaxone/flagyl on 3/3.  CCS consulted pharmacy to change abx to zosyn on 3/5.  Today, 06/13/2019: - afeb, wbc elevated but trending down - scr stable (crcl~40) - CCS's note on 3/7 recom to de-esc to PO abx with LOT of 14 days  Plan: - zosyn 3.375 gm IV q8h (infuse over 4 hrs) - If appropriate, consider changing abx to augmentin for IAI infection ____________________________________________  Weight: 135 lb (61.2 kg)  Temp (24hrs), Avg:98.1 F (36.7 C), Min:97.6 F (36.4 C), Max:98.5 F (36.9 C)  Recent Labs  Lab 06/07/19 0452 06/08/19 0504 06/10/19 0552 06/11/19 0522 06/13/19 0545  WBC 39.5* 39.4* 28.6* 30.1* 23.8*  CREATININE 0.73 0.76 0.80 0.76 0.89    Estimated Creatinine Clearance: 39.3 mL/min (by C-G formula based on SCr of 0.89 mg/dL).    Allergies  Allergen Reactions  . Rituximab Other (See Comments)    "She coded"/Upset stomach and blood pressure dropped    Antimicrobials this admission:  2/28 Cefepime  >>3/3 3/3 Rocephin>> 3/5 2/28 Flagyl>>3/5 3/5 zosyn >>   Microbiology results:  2/28 UCx: >100k GNR - Ecoli - resistant to amp, bactrim, and unasyn FINAL  Thank you for allowing pharmacy to be a part of this patient's care.  Lynelle Doctor 06/13/2019 10:56 AM

## 2019-06-13 NOTE — Progress Notes (Addendum)
PROGRESS NOTE    Carla Conway  SAY:[id number] DOB: 1927-03-10 DOA: 06/04/2019 PCP: Lujean Amel, MD   Brief Narrative: Carla Conway is a 86 y.o. female with hypertension, hyperlipidemia, CAD, dementia, CLL. Patient presented secondary to abdominal pain and found to have evidence of cholecystitis, pancreatitis and diverticulitis. She was started on empiric antibiotics and general surgery consulted.   Assessment & Plan:  Principal Problem:   Acute cholecystitis Active Problems:   Elevated liver enzymes   Dementia without behavioral disturbance (HCC)   Diverticulitis   Protein-calorie malnutrition, severe   Acute cholecystitis Seen on abdominal ultrasound. Patient is symptomatic.  Alkaline phosphatase and AST/ALT normal. General surgery is on board. Patient is not eating much secondary to pain. -General surgery recommendations: conservative management -Palliative care recommendations: DNR, continue current mode of care; if transitioned to comfort care, residential hospice vs ALF with hospice services -Transition to Augmentin  Acute pancreatitis In setting of acute cholecystitis and sludge noted on RUQ ultrasound. No history of pancreatitis in the past. Lipase initially elevated to a peak of 747 and trended down. Worsening symptoms with oral intake. Repeat CT scan shows pancreatitis and symptoms currently seem consistent with acute pancreatitis. Improved. Some nausea overnight -Continue morphine prn -Advance to soft diet  Diverticulitis On Cefepime IV which was transitioned to Ceftriaxone. Now on Zosyn as mentioned above -Continue antibiotics as above  Wheezing Mild. -Incentive spirometer and albuterol x1  CLL Patient with significant leukocytosis (lymphocyte dominant on previous differential). Patient is on prednisone as an outpatient and follows with oncology. She was on prednisone 5 mg daily which has been held secondary to above infectious processes. Currently not with  hospice but rather [facility].  E. Coli UTI Empirically treated with Cefepime. Sensitivities available. Switched to Ceftriaxone and then to Zosyn. Completed course -Antibiotics as mentioned above  Essential hypertension Better controlled this morning. -Continue amlodipine, metoprolol -Will discontinue hydralazine for now  Dementia Patient with decreased appetite. Possibly related to  -Continue Zyprexa, Zoloft   DVT prophylaxis: Heparin subq Code Status:   Code Status: DNR Family Communication: Daughter on telephone Disposition Plan: Anticipating discharge back to ALF/SNF vs hospice depending on if patient continues to tolerate a diet. Patient still having episodes of nausea/abdominal pain.   Consultants:   General surgery  Palliative care medicine  Procedures:   None  Antimicrobials:  Cefepime  Ceftriaxone  Zosyn   Subjective: Some nausea. States she does not eat breakfast but will drink her coffee and milk. States she will eat lunch.  Objective: Vitals:   06/12/19 1506 06/12/19 1648 06/12/19 2028 06/13/19 0618  BP: (!) 115/55 113/74 (!) 108/56 121/62  Pulse: 64 70 71 74  Resp: 17  16 16   Temp: 97.6 F (36.4 C)  98.5 F (36.9 C) 98.2 F (36.8 C)  TempSrc: Oral  Oral Oral  SpO2: (!) 88% (!) 84% 93% 91%  Weight:        Intake/Output Summary (Last 24 hours) at 06/13/2019 1051 Last data filed at 06/13/2019 1042 Gross per 24 hour  Intake 222.41 ml  Output 0 ml  Net 222.41 ml   Filed Weights   06/05/19 0402  Weight: 61.2 kg    Examination:  General exam: Appears calm and comfortable Respiratory system: Wheezing (mild). Respiratory effort normal. Cardiovascular system: S1 & S2 heard, RRR. Gastrointestinal system: Abdomen is nondistended, soft and nontender. No organomegaly or masses felt. Normal bowel sounds heard. Central nervous system: Alert and oriented to person and place. No focal  neurological deficits. Extremities: No edema.  No calf tenderness Skin: No cyanosis. No rashes Psychiatry: Judgement and insight appear impaired. Mood pleasant     Data Reviewed: I have personally reviewed following labs and imaging studies  CBC: Recent Labs  Lab 06/07/19 0452 06/08/19 0504 06/10/19 0552 06/11/19 0522 06/13/19 0545  WBC 39.5* 39.4* 28.6* 30.1* 23.8*  HGB 12.1 11.4* 10.3* 10.2* 10.0*  HCT 36.8 37.1 33.4* 33.2* 32.0*  MCV 90.9 94.6 95.2 97.1 96.1  PLT 134* 124* 123* 124* 329*   Basic Metabolic Panel: Recent Labs  Lab 06/07/19 0452 06/08/19 0504 06/10/19 0552 06/11/19 0522 06/13/19 0545  NA 135 136 136 138 142  K 3.8 3.7 3.4* 3.2* 2.9*  CL 104 106 105 106 107  CO2 20* 20* 22 23 28   GLUCOSE 77 82 75 90 91  BUN 16 12 18 17 13   CREATININE 0.73 0.76 0.80 0.76 0.89  CALCIUM 8.2* 8.1* 8.2* 8.2* 8.1*   GFR: Estimated Creatinine Clearance: 39.3 mL/min (by C-G formula based on SCr of 0.89 mg/dL). Liver Function Tests: Recent Labs  Lab 06/07/19 0452 06/08/19 0504 06/11/19 0522  AST 24 19 21   ALT 15 11 9   ALKPHOS 113 111 85  BILITOT 1.0 1.2 0.6  PROT 5.1* 5.1* 4.7*  ALBUMIN 2.7* 2.7* 2.6*   Recent Labs  Lab 06/10/19 1000  LIPASE 26   No results for input(s): AMMONIA in the last 168 hours. Coagulation Profile: No results for input(s): INR, PROTIME in the last 168 hours. Cardiac Enzymes: No results for input(s): CKTOTAL, CKMB, CKMBINDEX, TROPONINI in the last 168 hours. BNP (last 3 results) No results for input(s): PROBNP in the last 8760 hours. HbA1C: No results for input(s): HGBA1C in the last 72 hours. CBG: No results for input(s): GLUCAP in the last 168 hours. Lipid Profile: No results for input(s): CHOL, HDL, LDLCALC, TRIG, CHOLHDL, LDLDIRECT in the last 72 hours. Thyroid Function Tests: No results for input(s): TSH, T4TOTAL, FREET4, T3FREE, THYROIDAB in the last 72 hours. Anemia Panel: No results for input(s): VITAMINB12, FOLATE, FERRITIN, TIBC, IRON, RETICCTPCT in the last 72  hours. Sepsis Labs: No results for input(s): PROCALCITON, LATICACIDVEN in the last 168 hours.  Recent Results (from the past 240 hour(s))  Urine culture     Status: Abnormal   Collection Time: 06/05/19  2:06 AM   Specimen: Urine, Catheterized  Result Value Ref Range Status   Specimen Description   Final    URINE, CATHETERIZED Performed at [facility] [address], [place], [place] [address]    Special Requests NONE  Final   Culture >=100,000 COLONIES/mL ESCHERICHIA COLI (A)  Final   Report Status 06/07/2019 FINAL  Final   Organism ID, Bacteria ESCHERICHIA COLI (A)  Final      Susceptibility   Escherichia coli - MIC*    AMPICILLIN >=32 RESISTANT Resistant     CEFAZOLIN 16 SENSITIVE Sensitive     CEFTRIAXONE <=0.25 SENSITIVE Sensitive     CIPROFLOXACIN <=0.25 SENSITIVE Sensitive     GENTAMICIN <=1 SENSITIVE Sensitive     IMIPENEM <=0.25 SENSITIVE Sensitive     NITROFURANTOIN <=16 SENSITIVE Sensitive     TRIMETH/SULFA >=320 RESISTANT Resistant     AMPICILLIN/SULBACTAM >=32 RESISTANT Resistant     PIP/TAZO <=4 SENSITIVE Sensitive     * >=100,000 COLONIES/mL ESCHERICHIA COLI  Respiratory Panel by RT PCR (Flu A&B, Covid) - Nasopharyngeal Swab     Status: None   Collection Time: 06/05/19  4:02 AM  Specimen: Nasopharyngeal Swab  Result Value Ref Range Status   SARS Coronavirus 2 by RT PCR NEGATIVE NEGATIVE Final    Comment: (NOTE) SARS-CoV-2 target nucleic acids are NOT DETECTED. The SARS-CoV-2 RNA is generally detectable in upper respiratoy specimens during the acute phase of infection. The lowest concentration of SARS-CoV-2 viral copies this assay can detect is 131 copies/mL. A negative result does not preclude SARS-Cov-2 infection and should not be used as the sole basis for treatment or other patient management decisions. A negative result may occur with  improper specimen collection/handling, submission of specimen other than nasopharyngeal swab,  presence of viral mutation(s) within the areas targeted by this assay, and inadequate number of viral copies (<131 copies/mL). A negative result must be combined with clinical observations, patient history, and epidemiological information. The expected result is Negative. Fact Sheet for Patients:  PinkCheek.be Fact Sheet for Healthcare Providers:  GravelBags.it This test is not yet ap proved or cleared by the [place] FDA and  has been authorized for detection and/or diagnosis of SARS-CoV-2 by FDA under an Emergency Use Authorization (EUA). This EUA will remain  in effect (meaning this test can be used) for the duration of the COVID-19 declaration under Section 564(b)(1) of the Act, 21 U.S.C. section 360bbb-3(b)(1), unless the authorization is terminated or revoked sooner.    Influenza A by PCR NEGATIVE NEGATIVE Final   Influenza B by PCR NEGATIVE NEGATIVE Final    Comment: (NOTE) The Xpert Xpress SARS-CoV-2/FLU/RSV assay is intended as an aid in  the diagnosis of influenza from Nasopharyngeal swab specimens and  should not be used as a sole basis for treatment. Nasal washings and  aspirates are unacceptable for Xpert Xpress SARS-CoV-2/FLU/RSV  testing. Fact Sheet for Patients: PinkCheek.be Fact Sheet for Healthcare Providers: GravelBags.it This test is not yet approved or cleared by the [place] FDA and  has been authorized for detection and/or diagnosis of SARS-CoV-2 by  FDA under an Emergency Use Authorization (EUA). This EUA will remain  in effect (meaning this test can be used) for the duration of the  Covid-19 declaration under Section 564(b)(1) of the Act, 21  U.S.C. section 360bbb-3(b)(1), unless the authorization is  terminated or revoked. Performed at [facility], [place] [address] [place], [place] [address]           Radiology Studies: No results found.      Scheduled Meds: . albuterol  2.5 mg Nebulization Once  . amLODipine  10 mg Oral Daily  . aspirin EC  81 mg Oral Daily  . feeding supplement  1 Container Oral BID BM  . feeding supplement (PRO-STAT SUGAR FREE 64)  30 mL Oral BID  . heparin  5,000 Units Subcutaneous Q8H  . hydrALAZINE  25 mg Oral TID  . mouth rinse  15 mL Mouth Rinse BID  . metoprolol tartrate  25 mg Oral BID  . montelukast  10 mg Oral QHS  . multivitamin with minerals  1 tablet Oral Daily  . OLANZapine  2.5 mg Oral QHS  . pantoprazole  40 mg Oral Daily  . protein supplement  2 oz Oral QID  . saccharomyces boulardii  250 mg Oral BID  . sertraline  100 mg Oral Daily   Continuous Infusions: . lactated ringers 75 mL/hr at 06/13/19 0929  . piperacillin-tazobactam (ZOSYN)  IV 3.375 g (06/13/19 0834)     LOS: 8 days     Cordelia Poche, MD [facility] 06/13/2019, 10:51 AM  If 7PM-7AM,  please contact night-coverage [email or web address]

## 2019-06-13 NOTE — Progress Notes (Signed)
Physical Therapy Treatment Patient Details Name: Carla Conway MRN: [id number] DOB: 04-Jun-1926 Today's Date: 06/13/2019    History of Present Illness 86 yo female admitted with cholecystitis, pancreatitis. Hx of CLL, dementia, COPD, CKD, syncope    PT Comments    Pt in recliner sliding nearly out.  Requested to lay down.  Assisted to [facility] first. General transfer comment: assisted from recliner to [facility] and back to recliner.  75% VC's on proper hand placement to push self up and 75% VC's on turn completion prior to sit.  Pt present with increased fear of falling.  Assisted with peri care after a small BM.  General Gait Details: pivotal steps bed <> BSC only this session due to increased fear/anxiety  Positioned in recliner with multiple pillows.  Reclined to comfort.    Follow Up Recommendations  SNF(from [facility])     Equipment Recommendations       Recommendations for Other Services       Precautions / Restrictions Precautions Precautions: Fall Precaution Comments: Hx dementia Restrictions Weight Bearing Restrictions: No    Mobility  Bed Mobility               General bed mobility comments: OOB in recliner  Transfers Overall transfer level: Needs assistance Equipment used: Rolling walker (2 wheeled);2 person hand held assist Transfers: Sit to/from [facility] Sit to Stand: Mod assist;+2 physical assistance;+2 safety/equipment;Min assist Stand pivot transfers: Mod assist;Min assist;+2 physical assistance;+2 safety/equipment       General transfer comment: assisted from recliner to [facility] and back to recliner.  75% VC's on proper hand placement to push self up and 75% VC's on turn completion prior to sit.  Pt present with increased fear of falling.  Ambulation/Gait             General Gait Details: pivotal steps bed <> BSC only this session due to increased fear/anxiety   Stairs             Wheelchair Mobility    Modified Rankin  (Stroke Patients Only)       Balance                                            Cognition Arousal/Alertness: Awake/alert Behavior During Therapy: WFL for tasks assessed/performed Overall Cognitive Status: History of cognitive impairments - at baseline                                 General Comments: VERY HOH      Exercises      General Comments        Pertinent Vitals/Pain Pain Assessment: No/denies pain    Home Living                      Prior Function            PT Goals (current goals can now be found in the care plan section)      Frequency    Min 2X/week      PT Plan Discharge plan needs to be updated;Frequency needs to be updated    Co-evaluation              AM-PAC PT "6 Clicks" Mobility   Outcome Measure  Help needed turning from your back to  your side while in a flat bed without using bedrails?: A Little Help needed moving from lying on your back to sitting on the side of a flat bed without using bedrails?: A Lot Help needed moving to and from a bed to a chair (including a wheelchair)?: A Lot Help needed standing up from a chair using your arms (e.g., wheelchair or bedside chair)?: A Lot Help needed to walk in hospital room?: Total Help needed climbing 3-5 steps with a railing? : Total 6 Click Score: 11    End of Session Equipment Utilized During Treatment: Gait belt Activity Tolerance: Patient tolerated treatment well Patient left: in chair;with call bell/phone within reach;with chair alarm set Nurse Communication: Mobility status PT Visit Diagnosis: Muscle weakness (generalized) (M62.81);Difficulty in walking, not elsewhere classified (R26.2)     Time: 9390-3009 PT Time Calculation (min) (ACUTE ONLY): 13 min  Charges:  $Therapeutic Activity: 8-22 mins                     Rica Koyanagi  PTA Acute  Rehabilitation Services Pager      [phone] Office      [phone]

## 2019-06-13 NOTE — Care Management Important Message (Signed)
Important Message  Patient Details IM Letter given to Marney Doctor RN Case Manager to present to the Patient Name: Carla Conway MRN: [id number] Date of Birth: 12/07/1926   Medicare Important Message Given:  Yes     Kerin Salen 06/13/2019, 9:39 AM

## 2019-06-14 LAB — BASIC METABOLIC PANEL
Anion gap: 9 (ref 5–15)
BUN: 16 mg/dL (ref 8–23)
CO2: 28 mmol/L (ref 22–32)
Calcium: 8.1 mg/dL — ABNORMAL LOW (ref 8.9–10.3)
Chloride: 104 mmol/L (ref 98–111)
Creatinine, Ser: 0.86 mg/dL (ref 0.44–1.00)
GFR calc Af Amer: >60 mL/min (ref >60)
GFR calc non Af Amer: 59 mL/min — ABNORMAL LOW (ref >60)
Glucose, Bld: 117 mg/dL — ABNORMAL HIGH (ref 70–99)
Potassium: 3.7 mmol/L (ref 3.5–5.1)
Sodium: 141 mmol/L (ref 135–145)

## 2019-06-14 NOTE — TOC Initial Note (Signed)
Transition of Care [facility]) - Initial/Assessment Note    Patient Details  Name: Carla Conway MRN: [id number] Date of Birth: [birth year]  [facility] CM/SW Contact:    Lynnell Catalan, RN Phone Number: 06/14/2019, 11:47 AM  Clinical Narrative:                 Pt from [facility]. She [facility] by [facility] for palliative services. PMT is following for GOC. Unsure of DC plan at this time. TOC will continue to follow.         Activities of Daily Living Home Assistive Devices/Equipment: Eyeglasses, Grab bars around toilet, Grab bars in shower, Hand-held shower hose, Walker (specify type), Hospital bed, Blood pressure cuff, Scales(4 wheeled walker) ADL Screening (condition at time of admission) Patient's cognitive ability adequate to safely complete daily activities?: No Is the patient deaf or have difficulty hearing?: Yes(hoh) Does the patient have difficulty seeing, even when wearing glasses/contacts?: No Does the patient have difficulty concentrating, remembering, or making decisions?: Yes Patient able to express need for assistance with ADLs?: Yes Does the patient have difficulty dressing or bathing?: Yes Independently performs ADLs?: No Communication: Independent Dressing (OT): Needs assistance Is this a change from baseline?: Pre-admission baseline Grooming: Needs assistance Is this a change from baseline?: Pre-admission baseline Feeding: Needs assistance Is this a change from baseline?: Pre-admission baseline Bathing: Needs assistance Is this a change from baseline?: Pre-admission baseline Toileting: Needs assistance Is this a change from baseline?: Pre-admission baseline In/Out Bed: Needs assistance Is this a change from baseline?: Pre-admission baseline Walks in Home: Needs assistance Is this a change from baseline?: Pre-admission baseline Does the patient have difficulty walking or climbing stairs?: Yes Weakness of Legs: Both Weakness of  Arms/Hands: Both  Permission Sought/Granted                  Emotional Assessment              Admission diagnosis:  Acute cholecystitis [K81.0] Cholecystitis [K81.9] Diverticulitis [K57.92] RUQ abdominal pain [R10.11] Prolonged Q-T interval on ECG [R94.31] Acute biliary pancreatitis without infection or necrosis [K85.10] Non-intractable vomiting with nausea, unspecified vomiting type [R11.2] Patient Active Problem List   Diagnosis Date Noted  . Protein-calorie malnutrition, severe 06/11/2019  . Acute cholecystitis 06/05/2019  . Dementia without behavioral disturbance ([place]) 06/05/2019  . Diverticulitis 06/05/2019  . Preventive measure 12/29/2017  . Other fatigue 09/25/2017  . Malnutrition of moderate degree 07/20/2017  . Enterocolitis 07/18/2017  . Pancytopenia, acquired ([place]) 07/14/2017  . Malignant cachexia ([place]) 06/04/2017  . Hypokalemia 04/29/2017  . Left arm pain 04/29/2017  . TIA (transient ischemic attack) 04/29/2017  . Abdominal pain 04/28/2017  . Elevated liver enzymes 04/01/2017  . AKI (acute kidney injury) ([place]) 03/31/2017  . Dehydration 03/31/2017  . Generalized weakness 03/31/2017  . External hemorrhoids without complication 19/75/8832  . Port-A-Cath in place 01/06/2017  . Gastritis 01/06/2017  . Poor memory 12/11/2016  . Dysuria 11/13/2016  . Anaphylaxis 10/29/2016  . Anaphylactoid reaction 10/29/2016  . Goals of care, counseling/discussion 10/28/2016  . Chronic kidney disease, stage III (moderate) ([place]) 10/17/2016  . Lymphoma, small lymphocytic ([place]) 10/15/2016  . Neutropenia ([place]) 09/23/2016  . CLL (chronic lymphocytic leukemia) ([place]) 05/11/2016  . Acute on chronic diastolic heart failure ([place]) 05/10/2016  . Influenza A 05/09/2016  . Lymphadenopathy of head and neck region 05/09/2016  . Lymphadenopathy, thoracic 05/09/2016  . COPD (chronic obstructive pulmonary disease) ([place]) 05/09/2016  . Debility 05/09/2016  .  Syncopal episodes  05/09/2016  . Syncope 05/08/2016  . HTN (hypertension) 05/18/2013  . CAD in native artery 05/18/2013   PCP:  Lujean Amel, MD Pharmacy:   [facility] [place], [address] AT [address] [place] [address] Phone: [phone] Fax: [phone]  [facility] - [address] [place] [address] Phone: [phone] Fax: [phone]  [place], [place] - [address] [place] [address] Phone: [phone] Fax: [phone]     Social Determinants of Health (SDOH) Interventions    Readmission Risk Interventions Readmission Risk Prevention Plan 06/13/2019  Transportation Screening Complete  Medication Review (RN Care Manager) Complete  Some recent data might be hidden

## 2019-06-14 NOTE — Progress Notes (Signed)
PROGRESS NOTE    Carla Conway Pra  UQJ:[id number] DOB: [birth year] DOA: 06/04/2019 PCP: Lujean Amel, MD   Brief Narrative: Carla Conway is a 86 y.o. female with hypertension, hyperlipidemia, CAD, dementia, CLL. Patient presented secondary to abdominal pain and found to have evidence of cholecystitis, pancreatitis and diverticulitis. She was started on empiric antibiotics and general surgery consulted.   Assessment & Plan:  Principal Problem:   Acute cholecystitis Active Problems:   Elevated liver enzymes   Dementia without behavioral disturbance (HCC)   Diverticulitis   Protein-calorie malnutrition, severe   Acute cholecystitis Seen on abdominal ultrasound. Patient is symptomatic.  Alkaline phosphatase and AST/ALT normal. General surgery is on board. Patient is not eating much secondary to pain. -General surgery recommendations: conservative management -Palliative care recommendations: DNR, continue current mode of care -Transitioned to Augmentin to complete 14 days of treatment  Acute pancreatitis In setting of acute cholecystitis and sludge noted on RUQ ultrasound. No history of pancreatitis in the past. Lipase initially elevated to a peak of 747 and trended down. Worsening symptoms with oral intake. Repeat CT scan shows pancreatitis and symptoms currently seem consistent with acute pancreatitis. Improved. -Continue morphine prn -Advanced to soft diet  Diverticulitis On Cefepime IV which was transitioned to Ceftriaxone. Now on Zosyn as mentioned above -Continue antibiotics as above  Wheezing Mild. -Incentive spirometer and albuterol x1  CLL Patient with significant leukocytosis (lymphocyte dominant on previous differential). Patient is on prednisone as an outpatient and follows with oncology. She was on prednisone 5 mg daily which has been held secondary to above infectious processes. Currently not with hospice but rather [facility].  E. Coli  UTI Empirically treated with Cefepime. Sensitivities available. Switched to Ceftriaxone and then to Zosyn. Completed course -Antibiotics as mentioned above  Essential hypertension Better controlled. Hydralazine stopped on 3/8 secondary to soft blood pressures -Continue amlodipine, metoprolol  Dementia Patient with decreased appetite. Possibly related to  -Continue Zyprexa, Zoloft   DVT prophylaxis: Heparin subq Code Status:   Code Status: DNR Family Communication: Daughter on telephone Disposition Plan: Anticipating discharge back to ALF/SNF with hospice in 24-48 hours pending bed availability   Consultants:   General surgery  Palliative care medicine  Procedures:   None  Antimicrobials:  Cefepime  Ceftriaxone  Zosyn  Augmentin   Subjective: No issues today. Ate some of her breakfast.  Objective: Vitals:   06/13/19 1359 06/13/19 2121 06/14/19 0521 06/14/19 1400  BP: (!) 137/57 (!) 133/50 (!) 124/58 (!) 139/51  Pulse: 63 66 62 63  Resp: 16 14 16 18   Temp: 97.9 F (36.6 C) 98.5 F (36.9 C) 98.6 F (37 C) 98.2 F (36.8 C)  TempSrc: Oral Oral Oral Oral  SpO2: (!) 89% 90% (!) 80% 91%  Weight:        Intake/Output Summary (Last 24 hours) at 06/14/2019 1402 Last data filed at 06/14/2019 0520 Gross per 24 hour  Intake 200 ml  Output 400 ml  Net -200 ml   Filed Weights   06/05/19 0402  Weight: 61.2 kg    Examination:  General exam: Appears calm and comfortable Respiratory system: Clear to auscultation. Respiratory effort normal. Cardiovascular system: S1 & S2 heard, RRR. Gastrointestinal system: Abdomen is nondistended, soft and nontender. No organomegaly or masses felt. Normal bowel sounds heard. Central nervous system: Alert. No focal neurological deficits. Extremities: No edema. No calf tenderness Skin: No cyanosis. No rashes Psychiatry: Judgement and insight appear impaired. Pleasant mood.    Data Reviewed: I  have personally reviewed  following labs and imaging studies  CBC: Recent Labs  Lab 06/08/19 0504 06/10/19 0552 06/11/19 0522 06/13/19 0545  WBC 39.4* 28.6* 30.1* 23.8*  HGB 11.4* 10.3* 10.2* 10.0*  HCT 37.1 33.4* 33.2* 32.0*  MCV 94.6 95.2 97.1 96.1  PLT 124* 123* 124* 756*   Basic Metabolic Panel: Recent Labs  Lab 06/08/19 0504 06/10/19 0552 06/11/19 0522 06/13/19 0545  NA 136 136 138 142  K 3.7 3.4* 3.2* 2.9*  CL 106 105 106 107  CO2 20* 22 23 28   GLUCOSE 82 75 90 91  BUN 12 18 17 13   CREATININE 0.76 0.80 0.76 0.89  CALCIUM 8.1* 8.2* 8.2* 8.1*   GFR: Estimated Creatinine Clearance: 39.3 mL/min (by C-G formula based on SCr of 0.89 mg/dL). Liver Function Tests: Recent Labs  Lab 06/08/19 0504 06/11/19 0522  AST 19 21  ALT 11 9  ALKPHOS 111 85  BILITOT 1.2 0.6  PROT 5.1* 4.7*  ALBUMIN 2.7* 2.6*   Recent Labs  Lab 06/10/19 1000  LIPASE 26   No results for input(s): AMMONIA in the last 168 hours. Coagulation Profile: No results for input(s): INR, PROTIME in the last 168 hours. Cardiac Enzymes: No results for input(s): CKTOTAL, CKMB, CKMBINDEX, TROPONINI in the last 168 hours. BNP (last 3 results) No results for input(s): PROBNP in the last 8760 hours. HbA1C: No results for input(s): HGBA1C in the last 72 hours. CBG: No results for input(s): GLUCAP in the last 168 hours. Lipid Profile: No results for input(s): CHOL, HDL, LDLCALC, TRIG, CHOLHDL, LDLDIRECT in the last 72 hours. Thyroid Function Tests: No results for input(s): TSH, T4TOTAL, FREET4, T3FREE, THYROIDAB in the last 72 hours. Anemia Panel: No results for input(s): VITAMINB12, FOLATE, FERRITIN, TIBC, IRON, RETICCTPCT in the last 72 hours. Sepsis Labs: No results for input(s): PROCALCITON, LATICACIDVEN in the last 168 hours.  Recent Results (from the past 240 hour(s))  Urine culture     Status: Abnormal   Collection Time: 06/05/19  2:06 AM   Specimen: Urine, Catheterized  Result Value Ref Range Status   Specimen  Description   Final    URINE, CATHETERIZED Performed at [facility] [address] [place], [place] [address]    Special Requests NONE  Final   Culture >=100,000 COLONIES/mL ESCHERICHIA COLI (A)  Final   Report Status 06/07/2019 FINAL  Final   Organism ID, Bacteria ESCHERICHIA COLI (A)  Final      Susceptibility   Escherichia coli - MIC*    AMPICILLIN >=32 RESISTANT Resistant     CEFAZOLIN 16 SENSITIVE Sensitive     CEFTRIAXONE <=0.25 SENSITIVE Sensitive     CIPROFLOXACIN <=0.25 SENSITIVE Sensitive     GENTAMICIN <=1 SENSITIVE Sensitive     IMIPENEM <=0.25 SENSITIVE Sensitive     NITROFURANTOIN <=16 SENSITIVE Sensitive     TRIMETH/SULFA >=320 RESISTANT Resistant     AMPICILLIN/SULBACTAM >=32 RESISTANT Resistant     PIP/TAZO <=4 SENSITIVE Sensitive     * >=100,000 COLONIES/mL ESCHERICHIA COLI  Respiratory Panel by RT PCR (Flu A&B, Covid) - Nasopharyngeal Swab     Status: None   Collection Time: 06/05/19  4:02 AM   Specimen: Nasopharyngeal Swab  Result Value Ref Range Status   SARS Coronavirus 2 by RT PCR NEGATIVE NEGATIVE Final    Comment: (NOTE) SARS-CoV-2 target nucleic acids are NOT DETECTED. The SARS-CoV-2 RNA is generally detectable in upper respiratoy specimens during the acute phase of infection. The lowest concentration of SARS-CoV-2  viral copies this assay can detect is 131 copies/mL. A negative result does not preclude SARS-Cov-2 infection and should not be used as the sole basis for treatment or other patient management decisions. A negative result may occur with  improper specimen collection/handling, submission of specimen other than nasopharyngeal swab, presence of viral mutation(s) within the areas targeted by this assay, and inadequate number of viral copies (<131 copies/mL). A negative result must be combined with clinical observations, patient history, and epidemiological information. The expected result is Negative. Fact Sheet for  Patients:  PinkCheek.be Fact Sheet for Healthcare Providers:  GravelBags.it This test is not yet ap proved or cleared by the [place] FDA and  has been authorized for detection and/or diagnosis of SARS-CoV-2 by FDA under an Emergency Use Authorization (EUA). This EUA will remain  in effect (meaning this test can be used) for the duration of the COVID-19 declaration under Section 564(b)(1) of the Act, 21 U.S.C. section 360bbb-3(b)(1), unless the authorization is terminated or revoked sooner.    Influenza A by PCR NEGATIVE NEGATIVE Final   Influenza B by PCR NEGATIVE NEGATIVE Final    Comment: (NOTE) The Xpert Xpress SARS-CoV-2/FLU/RSV assay is intended as an aid in  the diagnosis of influenza from Nasopharyngeal swab specimens and  should not be used as a sole basis for treatment. Nasal washings and  aspirates are unacceptable for Xpert Xpress SARS-CoV-2/FLU/RSV  testing. Fact Sheet for Patients: PinkCheek.be Fact Sheet for Healthcare Providers: GravelBags.it This test is not yet approved or cleared by the [place] FDA and  has been authorized for detection and/or diagnosis of SARS-CoV-2 by  FDA under an Emergency Use Authorization (EUA). This EUA will remain  in effect (meaning this test can be used) for the duration of the  Covid-19 declaration under Section 564(b)(1) of the Act, 21  U.S.C. section 360bbb-3(b)(1), unless the authorization is  terminated or revoked. Performed at [facility], [place] [address] [place], [place] [address]          Radiology Studies: No results found.      Scheduled Meds: . albuterol  2.5 mg Nebulization Once  . amLODipine  10 mg Oral Daily  . amoxicillin-clavulanate  1 tablet Oral Q12H  . aspirin EC  81 mg Oral Daily  . feeding supplement  1 Container Oral BID BM  . feeding supplement (PRO-STAT  SUGAR FREE 64)  30 mL Oral BID  . heparin  5,000 Units Subcutaneous Q8H  . mouth rinse  15 mL Mouth Rinse BID  . metoprolol tartrate  25 mg Oral BID  . montelukast  10 mg Oral QHS  . multivitamin with minerals  1 tablet Oral Daily  . OLANZapine  2.5 mg Oral QHS  . pantoprazole  40 mg Oral Daily  . protein supplement  2 oz Oral QID  . saccharomyces boulardii  250 mg Oral BID  . sertraline  100 mg Oral Daily   Continuous Infusions:    LOS: 9 days     Cordelia Poche, MD [facility] 06/14/2019, 2:02 PM  If 7PM-7AM, please contact night-coverage [email or web address]

## 2019-06-15 LAB — CBC
HCT: 34.4 % — ABNORMAL LOW (ref 36.0–46.0)
Hemoglobin: 10.6 g/dL — ABNORMAL LOW (ref 12.0–15.0)
MCH: 29.7 pg (ref 26.0–34.0)
MCHC: 30.8 g/dL (ref 30.0–36.0)
MCV: 96.4 fL (ref 80.0–100.0)
Platelets: 141 10*3/uL — ABNORMAL LOW (ref 150–400)
RBC: 3.57 MIL/uL — ABNORMAL LOW (ref 3.87–5.11)
RDW: 17.5 % — ABNORMAL HIGH (ref 11.5–15.5)
WBC: 33.7 10*3/uL — ABNORMAL HIGH (ref 4.0–10.5)
nRBC: 0 % (ref 0.0–0.2)

## 2019-06-15 LAB — BASIC METABOLIC PANEL
Anion gap: 10 (ref 5–15)
BUN: 9 mg/dL (ref 8–23)
CO2: 30 mmol/L (ref 22–32)
Calcium: 7.9 mg/dL — ABNORMAL LOW (ref 8.9–10.3)
Chloride: 101 mmol/L (ref 98–111)
Creatinine, Ser: 0.63 mg/dL (ref 0.44–1.00)
GFR calc Af Amer: >60 mL/min (ref >60)
GFR calc non Af Amer: >60 mL/min (ref >60)
Glucose, Bld: 88 mg/dL (ref 70–99)
Potassium: 3.6 mmol/L (ref 3.5–5.1)
Sodium: 141 mmol/L (ref 135–145)

## 2019-06-15 LAB — SARS CORONAVIRUS 2 (TAT 6-24 HRS): SARS Coronavirus 2: NEGATIVE

## 2019-06-15 MED ORDER — CEPHALEXIN 500 MG PO CAPS
500.0000 mg | ORAL_CAPSULE | Freq: Three times a day (TID) | ORAL | Status: DC
  Administered 2019-06-15 (×2): 500 mg via ORAL
  Filled 2019-06-15 (×3): qty 1

## 2019-06-15 NOTE — Progress Notes (Addendum)
Hydrologist [facility]) Hospital Liaison: RN note     Notified by Transition of [place], CM of patient/family request for [facility] services at home at [place] after discharge. Chart and patient information under review by [facility] physician. Hospice eligibility pending currently.     Writer spoke with  Daughter, [name]  to initiate education related to hospice philosophy, services and team approach to care. [name] verbalized understanding of information given. Per discussion, plan is for discharge to [facility] after DME is in place.   Please send signed and completed DNR form home with patient/family. Patient will need prescriptions for discharge comfort medications.      DME needs have been discussed, patient currently has the following equipment in the home: walker.  Patient/family requests the following DME for delivery to the home: hospital bed ( no rails per [place])  and W/C.  [facility] equipment manager has been notified and will contact DME provider to arrange delivery to the home. Contact for delivery is [place] or [place] at [place].     [facility] aware of the above. Please notify [facility] when patient is ready to leave the unit at discharge. (Call [phone] or [phone] after 5pm.) [facility] information and contact numbers given to  Nanine Means and daughter [name].       Please call with any hospice related questions.      Thank you for this referral.      Farrel Gordon, RN, [facility] (listed on AMION under Hospice and [place] [place])   [phone]

## 2019-06-15 NOTE — Progress Notes (Signed)
PROGRESS NOTE    Carla Conway [facility]  NGE:[id number] DOB: 1926/08/28 DOA: 06/04/2019 PCP: Lujean Amel, MD   Brief Narrative: 86 year old with past medical history significant for hypertension, hyperlipidemia, CAD, dementia, CLL.  Patient presented secondary to abdominal pain and found to have evidence of cholecystitis, pancreatitis and diverticulitis.  She was a started on empiric antibiotics and general surgery was consulted.  Surgery recommended conservative management for cholecystitis.  Plan to treat with antibiotics for 14 days.  Patient was also found to have acute pancreatitis.  She currently denies abdominal pain.  Patient with poor oral intake.  Patient white count today higher than 2 days ago.  She does have CLL.  I will change Augmentin to Keflex to see if that will help with leukocytosis.  Of note her prior urine culture grew E. coli resistant to ampicillin sulbactam.    Assessment & Plan:   Principal Problem:   Acute cholecystitis Active Problems:   Elevated liver enzymes   Dementia without behavioral disturbance (HCC)   Diverticulitis   Protein-calorie malnutrition, severe   1-acute cholecystitis: Seen on abdominal ultrasound.  Patient with poor oral intake and abdominal pain.  General surgery was consulted, recommendation was for conservative management. Palliative care recommendation: DNR, continue current mode of care, hospice to follow-up on patient at her assisted living facility or SNF Patient white count higher today.  Will change Augmentin to Keflex.  Plan to repeat white blood count tomorrow  2-acute pancreatitis: In the setting of acute cholecystitis.  CT scan showed pancreatitis. Patient denies worsening abdominal pain.  3-Diverticulitis: Received treatment with IV antibiotics  CLL: Patient was on prednisone 5 mg which has been on hold due to infectious process E Coli UTI: She was treated with cefepime which was sensitive to E. Coli Dementia: Decrease oral  intake.  Continue with Zyprexa Zoloft Hypoxic respiratory failure. Continue with oxygen supplementation.  Chest x ray on 2-27 negative   Nutrition Problem: Severe Malnutrition Etiology: chronic illness(dementia)    Signs/Symptoms: per patient/family report, moderate fat depletion, severe fat depletion, mild muscle depletion, moderate muscle depletion    Interventions: MVI, Prostat, Boost Breeze, Other (Comment)(feeding assistance at meal times)  Estimated body mass index is 21.79 kg/m as calculated from the following:   Height as of 03/25/19: 5' 6"  (1.676 m).   Weight as of this encounter: 61.2 kg.   DVT prophylaxis: Heparin  Code Status: DNR Family Communication: Disposition Plan:  Patient is from: SLF Anticipated d/c date: 3-11 Barriers to d/c or necessity for inpatient status: plan to change Augmentin to Keflex.   Consultants:   Palliative care.   Procedures:   none  Antimicrobials:    Subjective: Alert, she is not feeling well today.  She denies worsening abdominal pain.  Objective: Vitals:   06/14/19 2223 06/14/19 2239 06/15/19 0628 06/15/19 1322  BP: (!) 161/69  (!) 163/60 (!) 142/53  Pulse: 68 65 71 68  Resp: 17  17 14   Temp: 98.1 F (36.7 C)  97.8 F (36.6 C) 98.9 F (37.2 C)  TempSrc: Oral  Oral Oral  SpO2: (!) 87% 95% 94% (!) 89%  Weight:        Intake/Output Summary (Last 24 hours) at 06/15/2019 1426 Last data filed at 06/15/2019 0700 Gross per 24 hour  Intake 600 ml  Output --  Net 600 ml   Filed Weights   06/05/19 0402  Weight: 61.2 kg    Examination:  General exam: Appears calm and comfortable  Respiratory system: Clear to  auscultation. Respiratory effort normal. Cardiovascular system: S1 & S2 heard, RRR.  Gastrointestinal system: Abdomen is nondistended, soft and nontender. No organomegaly or masses felt. Normal bowel sounds heard. Central nervous system: Alert and oriented.  Extremities: Symmetric 5 x 5 power. Skin: No  rashes, lesions or ulcers    Data Reviewed: I have personally reviewed following labs and imaging studies  CBC: Recent Labs  Lab 06/10/19 0552 06/11/19 0522 06/13/19 0545 06/15/19 0946  WBC 28.6* 30.1* 23.8* 33.7*  HGB 10.3* 10.2* 10.0* 10.6*  HCT 33.4* 33.2* 32.0* 34.4*  MCV 95.2 97.1 96.1 96.4  PLT 123* 124* 110* 798*   Basic Metabolic Panel: Recent Labs  Lab 06/10/19 0552 06/11/19 0522 06/13/19 0545 06/14/19 1404 06/15/19 0946  NA 136 138 142 141 141  K 3.4* 3.2* 2.9* 3.7 3.6  CL 105 106 107 104 101  CO2 22 23 28 28 30   GLUCOSE 75 90 91 117* 88  BUN 18 17 13 16 9   CREATININE 0.80 0.76 0.89 0.86 0.63  CALCIUM 8.2* 8.2* 8.1* 8.1* 7.9*   GFR: Estimated Creatinine Clearance: 43.8 mL/min (by C-G formula based on SCr of 0.63 mg/dL). Liver Function Tests: Recent Labs  Lab 06/11/19 0522  AST 21  ALT 9  ALKPHOS 85  BILITOT 0.6  PROT 4.7*  ALBUMIN 2.6*   Recent Labs  Lab 06/10/19 1000  LIPASE 26   No results for input(s): AMMONIA in the last 168 hours. Coagulation Profile: No results for input(s): INR, PROTIME in the last 168 hours. Cardiac Enzymes: No results for input(s): CKTOTAL, CKMB, CKMBINDEX, TROPONINI in the last 168 hours. BNP (last 3 results) No results for input(s): PROBNP in the last 8760 hours. HbA1C: No results for input(s): HGBA1C in the last 72 hours. CBG: No results for input(s): GLUCAP in the last 168 hours. Lipid Profile: No results for input(s): CHOL, HDL, LDLCALC, TRIG, CHOLHDL, LDLDIRECT in the last 72 hours. Thyroid Function Tests: No results for input(s): TSH, T4TOTAL, FREET4, T3FREE, THYROIDAB in the last 72 hours. Anemia Panel: No results for input(s): VITAMINB12, FOLATE, FERRITIN, TIBC, IRON, RETICCTPCT in the last 72 hours. Sepsis Labs: No results for input(s): PROCALCITON, LATICACIDVEN in the last 168 hours.  Recent Results (from the past 240 hour(s))  SARS CORONAVIRUS 2 (TAT 6-24 HRS) Nasopharyngeal Nasopharyngeal Swab      Status: None   Collection Time: 06/14/19  8:38 PM   Specimen: Nasopharyngeal Swab  Result Value Ref Range Status   SARS Coronavirus 2 NEGATIVE NEGATIVE Final    Comment: (NOTE) SARS-CoV-2 target nucleic acids are NOT DETECTED. The SARS-CoV-2 RNA is generally detectable in upper and lower respiratory specimens during the acute phase of infection. Negative results do not preclude SARS-CoV-2 infection, do not rule out co-infections with other pathogens, and should not be used as the sole basis for treatment or other patient management decisions. Negative results must be combined with clinical observations, patient history, and epidemiological information. The expected result is Negative. Fact Sheet for Patients: SugarRoll.be Fact Sheet for Healthcare Providers: https://www.woods-mathews.com/ This test is not yet approved or cleared by the [place] FDA and  has been authorized for detection and/or diagnosis of SARS-CoV-2 by FDA under an Emergency Use Authorization (EUA). This EUA will remain  in effect (meaning this test can be used) for the duration of the COVID-19 declaration under Section 56 4(b)(1) of the Act, 21 U.S.C. section 360bbb-3(b)(1), unless the authorization is terminated or revoked sooner. Performed at [facility], [place] [address]  [place], [place] [address]          Radiology Studies: No results found.      Scheduled Meds: . albuterol  2.5 mg Nebulization Once  . amLODipine  10 mg Oral Daily  . aspirin EC  81 mg Oral Daily  . cephALEXin  500 mg Oral Q8H  . feeding supplement  1 Container Oral BID BM  . feeding supplement (PRO-STAT SUGAR FREE 64)  30 mL Oral BID  . heparin  5,000 Units Subcutaneous Q8H  . mouth rinse  15 mL Mouth Rinse BID  . metoprolol tartrate  25 mg Oral BID  . montelukast  10 mg Oral QHS  . multivitamin with minerals  1 tablet Oral Daily  . OLANZapine  2.5 mg Oral QHS  .  pantoprazole  40 mg Oral Daily  . protein supplement  2 oz Oral QID  . saccharomyces boulardii  250 mg Oral BID  . sertraline  100 mg Oral Daily   Continuous Infusions:   LOS: 10 days    Time spent: 35 minutes    Maury Bamba A Charlean Carneal, MD [facility]   If 7PM-7AM, please contact night-coverage [email or web address]  06/15/2019, 2:26 PM

## 2019-06-16 LAB — CBC
HCT: 35.5 % — ABNORMAL LOW (ref 36.0–46.0)
Hemoglobin: 11.1 g/dL — ABNORMAL LOW (ref 12.0–15.0)
MCH: 30.1 pg (ref 26.0–34.0)
MCHC: 31.3 g/dL (ref 30.0–36.0)
MCV: 96.2 fL (ref 80.0–100.0)
Platelets: 156 10*3/uL (ref 150–400)
RBC: 3.69 MIL/uL — ABNORMAL LOW (ref 3.87–5.11)
RDW: 17.6 % — ABNORMAL HIGH (ref 11.5–15.5)
WBC: 47 10*3/uL — ABNORMAL HIGH (ref 4.0–10.5)
nRBC: 0 % (ref 0.0–0.2)

## 2019-06-16 MED ORDER — PIPERACILLIN-TAZOBACTAM 3.375 G IVPB
3.3750 g | Freq: Three times a day (TID) | INTRAVENOUS | Status: DC
  Administered 2019-06-16 – 2019-06-17 (×4): 3.375 g via INTRAVENOUS
  Filled 2019-06-16 (×5): qty 50

## 2019-06-16 NOTE — Progress Notes (Signed)
Pharmacy Antibiotic Note  Carla Conway is a 86 y.o. female known to pharmacy from abx consults with this admission. She was treated with cefepime/flagyl on admission for acute cholecystitis and possible diverticulits with pancreatitis and de-escalated to ceftriaxone/flagyl on 3/3.  [facility]S consulted pharmacy to change abx to zosyn on 3/5 and then subsequently changed to augemtin on on 3/8 and keflex on 3/10. Pt's WBC remains elevated and trending up.  Per MD, transition patient back to zosyn on 3/11.  Today, 06/16/2019: - afeb, wbc elevated and trending up - scr stable (crcl~40)  Plan: - zosyn 3.375 gm IV q8h (infuse over 4 hrs) ____________________________________________  Weight: 135 lb (61.2 kg)  Temp (24hrs), Avg:98.4 F (36.9 C), Min:97.8 F (36.6 C), Max:98.9 F (37.2 C)  Recent Labs  Lab 06/10/19 0552 06/11/19 0522 06/13/19 0545 06/14/19 1404 06/15/19 0946 06/16/19 0517  WBC 28.6* 30.1* 23.8*  --  33.7* 47.0*  CREATININE 0.80 0.76 0.89 0.86 0.63  --     Estimated Creatinine Clearance: 43.8 mL/min (by C-G formula based on SCr of 0.63 mg/dL).    Allergies  Allergen Reactions  . Rituximab Other (See Comments)    "She coded"/Upset stomach and blood pressure dropped    Antimicrobials this admission:  2/28 Cefepime  >>3/3 3/3 Rocephin>> 3/5 2/28 Flagyl>>3/5 3/5 zosyn ([facility]S esc abx)>>3/8>> resumed 3/11>> 3/8 augmentin>> 3/10 3/10 keflex >> 3/11  Microbiology results:  2/28 UCx: >100k GNR - Ecoli - resistant to amp, bactrim, and unasyn FINAL   Thank you for allowing pharmacy to be a part of this patient's care.  Lynelle Doctor 06/16/2019 7:11 AM

## 2019-06-16 NOTE — Progress Notes (Signed)
PROGRESS NOTE    Syesha Thaw [facility]  NFA:[id number] DOB: 06/18/1926 DOA: 06/04/2019 PCP: Lujean Amel, MD   Brief Narrative: 86 year old with past medical history significant for hypertension, hyperlipidemia, CAD, dementia, CLL.  Patient presented secondary to abdominal pain and found to have evidence of cholecystitis, pancreatitis and diverticulitis.  She was a started on empiric antibiotics and general surgery was consulted.  Surgery recommended conservative management for cholecystitis.  Plan to treat with antibiotics for 14 days.  Patient was also found to have acute pancreatitis.  She currently denies abdominal pain.  Patient with poor oral intake.  Patient white count today higher than 2 days ago.  She does have CLL.  I will change Augmentin to Keflex to see if that will help with leukocytosis.  Of note her prior urine culture grew E. coli resistant to ampicillin sulbactam.    Assessment & Plan:   Principal Problem:   Acute cholecystitis Active Problems:   Elevated liver enzymes   Dementia without behavioral disturbance (HCC)   Diverticulitis   Protein-calorie malnutrition, severe   1-acute cholecystitis: Seen on abdominal ultrasound.  Patient with poor oral intake and abdominal pain.  General surgery was consulted, recommendation was for conservative management. Palliative care recommendation: DNR, continue current mode of care, hospice to follow-up on patient at her assisted living facility or SNF WBC increase to 30 K Yesterday, antibiotics change to keflex, WBC worse today to 40 K. Change Antibiotics to zosyn.  Palliative discussed with daughter, plan to see how patient does on IV antibiotics. It might be her CLL activation.   2-Acute pancreatitis: In the setting of acute cholecystitis.  CT scan showed pancreatitis. Patient denies worsening abdominal pain.  3-Diverticulitis: Received treatment with IV antibiotics  CLL: Patient was on prednisone 5 mg which has been on hold due  to infectious process E Coli UTI: She was treated with cefepime which was sensitive to E. Coli Dementia: Decrease oral intake.  Continue with Zyprexa Zoloft Hypoxic respiratory failure. Continue with oxygen supplementation.  Chest x ray on 2-27 negative   Nutrition Problem: Severe Malnutrition Etiology: chronic illness(dementia)    Signs/Symptoms: per patient/family report, moderate fat depletion, severe fat depletion, mild muscle depletion, moderate muscle depletion    Interventions: MVI, Prostat, Boost Breeze, Other (Comment)(feeding assistance at meal times)  Estimated body mass index is 21.79 kg/m as calculated from the following:   Height as of 03/25/19: 5' 6"  (1.676 m).   Weight as of this encounter: 61.2 kg.   DVT prophylaxis: Heparin  Code Status: DNR Family Communication: Disposition Plan:  Patient is from: SLF Anticipated d/c date: 1 or 2 days depending of WBC and goals of care.  Barriers to d/c or necessity for inpatient status: see how patient does on IV zosyn.   Consultants:   Palliative care.   Procedures:   none  Antimicrobials:    Subjective: Alert, not eating enough.  Denies worsening abdominal pain/  Objective: Vitals:   06/15/19 2133 06/16/19 0608 06/16/19 1035 06/16/19 1334  BP: (!) 145/77 (!) 154/75 (!) 141/83 (!) 161/80  Pulse: 77 64 81 74  Resp: 14 14  18   Temp: 98.6 F (37 C) 97.8 F (36.6 C)  (!) 97.4 F (36.3 C)  TempSrc: Oral Oral  Oral  SpO2: 92% (!) 89% 93% 94%  Weight:        Intake/Output Summary (Last 24 hours) at 06/16/2019 1359 Last data filed at 06/16/2019 1329 Gross per 24 hour  Intake 360 ml  Output 1100 ml  Net -740 ml   Filed Weights   06/05/19 0402  Weight: 61.2 kg    Examination:  General exam: NAD Respiratory system: CTA Cardiovascular system: S 1. S 2 RRR Gastrointestinal system: BS present, soft, nt Central nervous system: Alert and orineted.  Extremities: Symmetric power,  Skin: no rashes    Data Reviewed: I have personally reviewed following labs and imaging studies  CBC: Recent Labs  Lab 06/10/19 0552 06/11/19 0522 06/13/19 0545 06/15/19 0946 06/16/19 0517  WBC 28.6* 30.1* 23.8* 33.7* 47.0*  HGB 10.3* 10.2* 10.0* 10.6* 11.1*  HCT 33.4* 33.2* 32.0* 34.4* 35.5*  MCV 95.2 97.1 96.1 96.4 96.2  PLT 123* 124* 110* 141* 665   Basic Metabolic Panel: Recent Labs  Lab 06/10/19 0552 06/11/19 0522 06/13/19 0545 06/14/19 1404 06/15/19 0946  NA 136 138 142 141 141  K 3.4* 3.2* 2.9* 3.7 3.6  CL 105 106 107 104 101  CO2 22 23 28 28 30   GLUCOSE 75 90 91 117* 88  BUN 18 17 13 16 9   CREATININE 0.80 0.76 0.89 0.86 0.63  CALCIUM 8.2* 8.2* 8.1* 8.1* 7.9*   GFR: Estimated Creatinine Clearance: 43.8 mL/min (by C-G formula based on SCr of 0.63 mg/dL). Liver Function Tests: Recent Labs  Lab 06/11/19 0522  AST 21  ALT 9  ALKPHOS 85  BILITOT 0.6  PROT 4.7*  ALBUMIN 2.6*   Recent Labs  Lab 06/10/19 1000  LIPASE 26   No results for input(s): AMMONIA in the last 168 hours. Coagulation Profile: No results for input(s): INR, PROTIME in the last 168 hours. Cardiac Enzymes: No results for input(s): CKTOTAL, CKMB, CKMBINDEX, TROPONINI in the last 168 hours. BNP (last 3 results) No results for input(s): PROBNP in the last 8760 hours. HbA1C: No results for input(s): HGBA1C in the last 72 hours. CBG: No results for input(s): GLUCAP in the last 168 hours. Lipid Profile: No results for input(s): CHOL, HDL, LDLCALC, TRIG, CHOLHDL, LDLDIRECT in the last 72 hours. Thyroid Function Tests: No results for input(s): TSH, T4TOTAL, FREET4, T3FREE, THYROIDAB in the last 72 hours. Anemia Panel: No results for input(s): VITAMINB12, FOLATE, FERRITIN, TIBC, IRON, RETICCTPCT in the last 72 hours. Sepsis Labs: No results for input(s): PROCALCITON, LATICACIDVEN in the last 168 hours.  Recent Results (from the past 240 hour(s))  SARS CORONAVIRUS 2 (TAT 6-24 HRS) Nasopharyngeal  Nasopharyngeal Swab     Status: None   Collection Time: 06/14/19  8:38 PM   Specimen: Nasopharyngeal Swab  Result Value Ref Range Status   SARS Coronavirus 2 NEGATIVE NEGATIVE Final    Comment: (NOTE) SARS-CoV-2 target nucleic acids are NOT DETECTED. The SARS-CoV-2 RNA is generally detectable in upper and lower respiratory specimens during the acute phase of infection. Negative results do not preclude SARS-CoV-2 infection, do not rule out co-infections with other pathogens, and should not be used as the sole basis for treatment or other patient management decisions. Negative results must be combined with clinical observations, patient history, and epidemiological information. The expected result is Negative. Fact Sheet for Patients: SugarRoll.be Fact Sheet for Healthcare Providers: https://www.woods-mathews.com/ This test is not yet approved or cleared by the [place] FDA and  has been authorized for detection and/or diagnosis of SARS-CoV-2 by FDA under an Emergency Use Authorization (EUA). This EUA will remain  in effect (meaning this test can be used) for the duration of the COVID-19 declaration under Section 56 4(b)(1) of the Act, 21 U.S.C. section 360bbb-3(b)(1), unless the authorization is terminated or revoked  sooner. Performed at [facility], [place] [address], [place], [place] [address]          Radiology Studies: No results found.      Scheduled Meds: . albuterol  2.5 mg Nebulization Once  . amLODipine  10 mg Oral Daily  . aspirin EC  81 mg Oral Daily  . feeding supplement  1 Container Oral BID BM  . feeding supplement (PRO-STAT SUGAR FREE 64)  30 mL Oral BID  . heparin  5,000 Units Subcutaneous Q8H  . mouth rinse  15 mL Mouth Rinse BID  . metoprolol tartrate  25 mg Oral BID  . montelukast  10 mg Oral QHS  . multivitamin with minerals  1 tablet Oral Daily  . OLANZapine  2.5 mg Oral QHS  . pantoprazole  40  mg Oral Daily  . protein supplement  2 oz Oral QID  . saccharomyces boulardii  250 mg Oral BID  . sertraline  100 mg Oral Daily   Continuous Infusions: . piperacillin-tazobactam (ZOSYN)  IV 3.375 g (06/16/19 0812)     LOS: 11 days    Time spent: 35 minutes    Swan Fairfax A Naveh Rickles, MD [facility]   If 7PM-7AM, please contact night-coverage [email or web address]  06/16/2019, 1:59 PM

## 2019-06-16 NOTE — Care Management Important Message (Signed)
Important Message  Patient Details IM Letter given to Marney Doctor RN Case Manager to present to the Patient Name: Carla Conway MRN: [id number] Date of Birth: 09/25/1926   Medicare Important Message Given:  Yes     Kerin Salen 06/16/2019, 10:38 AM

## 2019-06-16 NOTE — Progress Notes (Signed)
Daily Progress Note   Patient Name: Carla Conway [facility]       Date: 06/16/2019 DOB: [birth year]  Age: 86 y.o. MRN#: [id number] Attending Physician: Elmarie Shiley, MD Primary Care Physician: Lujean Amel, MD Admit Date: 06/04/2019  Reason for Consultation/Follow-up: Establishing goals of care  Subjective:  Carla Conway is awake alert sitting in bed this morning.  She is in no distress but is very hard of hearing.  I called and spoke with her daughter, Carla Conway.  We reviewed her clinical course over the past 48 hours with increase in WBC and uncertainty if this is related to CLL vs possible infectious process.  Discussed transition back to IV abx this morning and options of continuation of current care and reevaluation in 24 hours vs transition back to ALF with hospice despite rising WBC.  Carla Conway reports that her mother was "absolutely miserable" when infection was uncontrolled and she is concerned that this may happen again if significant jump in WBC is related to infectious etiology.      Length of Stay: 11  Current Medications: Scheduled Meds:  . albuterol  2.5 mg Nebulization Once  . amLODipine  10 mg Oral Daily  . aspirin EC  81 mg Oral Daily  . feeding supplement  1 Container Oral BID BM  . feeding supplement (PRO-STAT SUGAR FREE 64)  30 mL Oral BID  . heparin  5,000 Units Subcutaneous Q8H  . mouth rinse  15 mL Mouth Rinse BID  . metoprolol tartrate  25 mg Oral BID  . montelukast  10 mg Oral QHS  . multivitamin with minerals  1 tablet Oral Daily  . OLANZapine  2.5 mg Oral QHS  . pantoprazole  40 mg Oral Daily  . protein supplement  2 oz Oral QID  . saccharomyces boulardii  250 mg Oral BID  . sertraline  100 mg Oral Daily    Continuous Infusions: . piperacillin-tazobactam (ZOSYN)  IV  3.375 g (06/16/19 0812)    PRN Meds: acetaminophen **OR** acetaminophen, hydrALAZINE, morphine injection, ondansetron (ZOFRAN) IV  Physical Exam         Weak appearing elderly lady Hard of hearing No distress Abdomen not distended S1 S2 Regular work of breathing Dementia  Vital Signs: BP (!) 154/75 (BP Location: Left Arm)   Pulse 64   Temp 97.8 F (  36.6 C) (Oral)   Resp 14   Wt 61.2 kg   SpO2 (!) 89%   BMI 21.79 kg/m  SpO2: SpO2: (!) 89 % O2 Device: O2 Device: Room Air O2 Flow Rate: O2 Flow Rate (L/min): 2 L/min  Intake/output summary:   Intake/Output Summary (Last 24 hours) at 06/16/2019 1015 Last data filed at 06/16/2019 4827 Gross per 24 hour  Intake 240 ml  Output 1100 ml  Net -860 ml   LBM: Last BM Date: 06/14/19 Baseline Weight: Weight: 61.2 kg Most recent weight: Weight: 61.2 kg       Palliative Assessment/Data:    Flowsheet Rows     Most Recent Value  Intake Tab  Referral Department  Hospitalist  Unit at Time of Referral  Oncology Unit  Date Notified  06/08/19  Palliative Care Type  New Palliative care  Reason for referral  Clarify Goals of Care  Date of Admission  06/04/19  Date first seen by Palliative Care  06/09/19  # of days Palliative referral response time  1 Day(s)  # of days IP prior to Palliative referral  4  Clinical Assessment  Psychosocial & Spiritual Assessment  Palliative Care Outcomes      Patient Active Problem List   Diagnosis Date Noted  . Protein-calorie malnutrition, severe 06/11/2019  . Acute cholecystitis 06/05/2019  . Dementia without behavioral disturbance ([place]) 06/05/2019  . Diverticulitis 06/05/2019  . Preventive measure 12/29/2017  . Other fatigue 09/25/2017  . Malnutrition of moderate degree 07/20/2017  . Enterocolitis 07/18/2017  . Pancytopenia, acquired ([place]) 07/14/2017  . Malignant cachexia ([place]) 06/04/2017  . Hypokalemia 04/29/2017  . Left arm pain 04/29/2017  . TIA (transient ischemic attack)  04/29/2017  . Abdominal pain 04/28/2017  . Elevated liver enzymes 04/01/2017  . AKI (acute kidney injury) ([place]) 03/31/2017  . Dehydration 03/31/2017  . Generalized weakness 03/31/2017  . External hemorrhoids without complication 07/86/7544  . Port-A-Cath in place 01/06/2017  . Gastritis 01/06/2017  . Poor memory 12/11/2016  . Dysuria 11/13/2016  . Anaphylaxis 10/29/2016  . Anaphylactoid reaction 10/29/2016  . Goals of care, counseling/discussion 10/28/2016  . Chronic kidney disease, stage III (moderate) ([place]) 10/17/2016  . 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 Neutropenia ([place]) 09/23/2016  . CLL (chronic lymphocytic leukemia) ([place]) 05/11/2016  . Acute on chronic diastolic heart failure ([place]) 05/10/2016  . Influenza A 05/09/2016  . Lymphadenopathy of head and neck region 05/09/2016  . Lymphadenopathy, thoracic 05/09/2016  . COPD (chronic obstructive pulmonary disease) ([place]) 05/09/2016  . Debility 05/09/2016  . Syncopal episodes 05/09/2016  . Syncope 05/08/2016  . HTN (hypertension) 05/18/2013  . CAD in native artery 05/18/2013    Palliative Care Assessment & Plan   Patient Profile:    Assessment:  86 yo with abdominal pain, cholelithiasis, possible cholecystitis, sigmoid diverticulitis, pancreatitis. General surgery is following, serial imaging of the abdomen is being done, antibiotic trial and medical management is ongoing Dementia Minimal PO intake Generalized deconditioning and functional decline.   Recommendations/Plan: Patient with increasing WBC over the past couple of days with indeterminate etiology. Restarted on IV abx this AM.  Overall goal has been transition back to [place] with hospice on discharge, but hold on discharge to see how she responds to current interventions and resumption of Zosyn as concern that her pain will become uncontrolled if this is related to worsening infection (she was miserable with uncontrolled pain from infection earlier this  hospital course).    PMT to f/u tomorrow.   Code  Status:    Code Status Orders  (From admission, onward)         Start     Ordered   06/05/19 0516  Do not attempt resuscitation (DNR)  Continuous    Question Answer Comment  In the event of cardiac or respiratory ARREST Do not call a "code blue"   In the event of cardiac or respiratory ARREST Do not perform Intubation, CPR, defibrillation or ACLS   In the event of cardiac or respiratory ARREST Use medication by any route, position, wound care, and other measures to relive pain and suffering. May use oxygen, suction and manual treatment of airway obstruction as needed for comfort.   Comments I personally called the patient's daughter in the presence of ED nurse who is taking care of the patient and patient 's daughter clearly mentioned that patient is DNR      06/05/19 0516        Code Status History    Date Active Date Inactive Code Status Order ID Comments User Context   06/05/2019 0441 06/05/2019 0515 Full Code [id number]  Edmonia Lynch, DO ED   11/21/2017 1643 11/23/2017 1708 Partial Code [id number]  Damita Lack, MD ED   07/18/2017 1313 07/23/2017 1905 Full Code [id number]  Elodia Florence., MD Inpatient   04/29/2017 1812 05/01/2017 1952 Full Code [id number]  Barton Dubois, MD ED   03/31/2017 2208 04/03/2017 2108 Full Code [id number]  Etta Quill, DO ED   10/29/2016 1534 10/30/2016 1634 Full Code [id number]  Elmarie Shiley, MD Inpatient   05/09/2016 0049 05/11/2016 1904 Full Code [id number]  Roney Jaffe, MD Inpatient   Advance Care Planning Activity       Prognosis:   guarded   Discharge Planning:  To Be Determined  Care plan was discussed with daughter, Dr. Tyrell Antonio, CM  Thank you for allowing the Palliative Medicine Team to assist in the care of this patient.   Time In: 0930 Time Out: 1010 Total Time 40 Prolonged Time Billed  no       Greater than 50%  of this time was spent counseling and coordinating  care related to the above assessment and plan.  Micheline Rough, MD  Please contact Palliative Medicine Team phone at [phone] for questions and concerns.

## 2019-06-16 NOTE — Progress Notes (Signed)
Physical Therapy Treatment Patient Details Name: Carla Conway MRN: [id number] DOB: [birth year] Today's Date: 06/16/2019    History of Present Illness 86 yo female admitted with cholecystitis, pancreatitis. Hx of CLL, dementia, COPD, CKD, syncope    PT Comments    Assisted OOB to [facility] then attempted gait.  General bed mobility comments: assisted from supine to EOB using bed pad to complete scooting.  General transfer comment: assisted from elevated bed to [facility] + 2 assist with 75% VC's on proper hand transfer and turn completion.  Assisted off BSC + 2 side by side assist and hand over hand assist to place B hands on walker during stance.  Poor posture.  Poor balance. General Gait Details: attempted forward gait however very unsteady, difficulty advancing either LE despite + 2 side by side assist.  Recliner pulled up to pt from behind. Positioned in recliner to comfort.    Follow Up Recommendations  SNF     Equipment Recommendations  None recommended by PT    Recommendations for Other Services       Precautions / Restrictions Precautions Precautions: Fall Precaution Comments: Hx dementia, VERY HOH Restrictions Weight Bearing Restrictions: No    Mobility  Bed Mobility Overal bed mobility: Needs Assistance Bed Mobility: Supine to Sit     Supine to sit: Mod assist     General bed mobility comments: assisted from supine to EOB using bed pad to complete scooting  Transfers Overall transfer level: Needs assistance Equipment used: Rolling walker (2 wheeled);2 person hand held assist Transfers: Sit to/from [facility] Sit to Stand: Mod assist;+2 physical assistance;+2 safety/equipment;Min assist Stand pivot transfers: Mod assist;Min assist;+2 physical assistance;+2 safety/equipment       General transfer comment: assisted from elevated bed to [facility] + 2 assist with 75% VC's on proper hand transfer and turn completion.  Assisted off BSC + 2 side by side assist and hand  over hand assist to place B hands on walker during stance.  Poor posture.  Poor balance.  Ambulation/Gait Ambulation/Gait assistance: Max assist;+2 physical assistance;+2 safety/equipment Gait Distance (Feet): 1 Feet Assistive device: Rolling walker (2 wheeled) Gait Pattern/deviations: Step-to pattern;Narrow base of support;Decreased step length - right;Decreased step length - left Gait velocity: decreased   General Gait Details: attempted forward gait however very unsteady, difficulty advancing either LE despite + 2 side by side assist.  Recliner pulled up to pt from behind.   Stairs             Wheelchair Mobility    Modified Rankin (Stroke Patients Only)       Balance                                            Cognition Arousal/Alertness: Awake/alert Behavior During Therapy: WFL for tasks assessed/performed Overall Cognitive Status: History of cognitive impairments - at baseline                                 General Comments: VERY HOH      Exercises      General Comments        Pertinent Vitals/Pain Pain Assessment: No/denies pain    Home Living                      Prior Function  PT Goals (current goals can now be found in the care plan section) Progress towards PT goals: Progressing toward goals    Frequency    Min 2X/week      PT Plan Current plan remains appropriate    Co-evaluation              AM-PAC PT "6 Clicks" Mobility   Outcome Measure  Help needed turning from your back to your side while in a flat bed without using bedrails?: A Lot Help needed moving from lying on your back to sitting on the side of a flat bed without using bedrails?: A Lot Help needed moving to and from a bed to a chair (including a wheelchair)?: A Lot Help needed standing up from a chair using your arms (e.g., wheelchair or bedside chair)?: A Lot Help needed to walk in hospital room?: A Lot Help  needed climbing 3-5 steps with a railing? : Total 6 Click Score: 11    End of Session Equipment Utilized During Treatment: Gait belt Activity Tolerance: Patient limited by fatigue Patient left: in chair;with call bell/phone within reach;with chair alarm set Nurse Communication: Mobility status PT Visit Diagnosis: Muscle weakness (generalized) (M62.81);Difficulty in walking, not elsewhere classified (R26.2)     Time: 1020-1045 PT Time Calculation (min) (ACUTE ONLY): 25 min  Charges:  $Gait Training: 8-22 mins $Therapeutic Activity: 8-22 mins                     {Hebah Bogosian  PTA Acute  Rehabilitation Services Pager      [phone] Office      [phone]

## 2019-06-17 LAB — CBC
HCT: 36.8 % (ref 36.0–46.0)
Hemoglobin: 11.5 g/dL — ABNORMAL LOW (ref 12.0–15.0)
MCH: 29.8 pg (ref 26.0–34.0)
MCHC: 31.3 g/dL (ref 30.0–36.0)
MCV: 95.3 fL (ref 80.0–100.0)
Platelets: 173 10*3/uL (ref 150–400)
RBC: 3.86 MIL/uL — ABNORMAL LOW (ref 3.87–5.11)
RDW: 17.6 % — ABNORMAL HIGH (ref 11.5–15.5)
WBC: 52.3 10*3/uL (ref 4.0–10.5)
nRBC: 0 % (ref 0.0–0.2)

## 2019-06-17 MED ORDER — SACCHAROMYCES BOULARDII 250 MG PO CAPS: 250.0000 mg | ORAL_CAPSULE | Freq: Two times a day (BID) | ORAL | 0 refills | Status: AC

## 2019-06-17 MED ORDER — HYDROCODONE-ACETAMINOPHEN 5-325 MG PO TABS: 1.0000 | ORAL_TABLET | Freq: Three times a day (TID) | ORAL | 0 refills | Status: AC | PRN

## 2019-06-17 MED ORDER — CEPHALEXIN 500 MG PO CAPS: 500.0000 mg | ORAL_CAPSULE | Freq: Three times a day (TID) | ORAL | 0 refills | Status: AC

## 2019-06-17 NOTE — Progress Notes (Signed)
Pt is resting will continue to monitor.

## 2019-06-17 NOTE — TOC Transition Note (Signed)
Transition of Care [facility]) - CM/SW Discharge Note   Patient Details  Name: Carla Conway MRN: [id number] Date of Birth: 04/19/1926  [facility] CM/SW Contact:  Lynnell Catalan, RN Phone Number: 06/17/2019, 2:06 PM   Clinical Narrative:     Pt to dc back to her Assisted Living today ([place]). Daughter has requested to increase her palliative services to full hospice services with [facility]. [facility] liaison contacted for update on services needed. [facility] had hospital bed delivered to [facility] and family has paid for additional services at [place] (full toilleting and feeding services). [facility] to transport pt back to [place] and [facility] to go with pt.  Final next level of care: Assisted Living Barriers to Discharge: No Barriers Identified      HH Arranged: Disease Management [place] Agency: Hospice and [place] Date [place]: 06/15/19   Representative spoke with at [place]: [place] ([place]) Interventions     Readmission Risk Interventions Readmission Risk Prevention Plan 06/13/2019  Transportation Screening Complete  Medication Review Press photographer) Complete  Some recent data might be hidden

## 2019-06-17 NOTE — Progress Notes (Signed)
Daily Progress Note   Patient Name: Carla Conway [facility]       Date: 06/17/2019 DOB: Aug 18, 1926  Age: 86 y.o. MRN#: [id number] Attending Physician: Elmarie Shiley, MD Primary Care Physician: Lujean Amel, MD Admit Date: 06/04/2019  Reason for Consultation/Follow-up: Establishing goals of care  Subjective: Carla Conway is awake alert sitting in bed this morning.  She is in no distress but is very hard of hearing.  She is awake and interac Ive and reports being appreciative of visit.  I called and spoke with her daughter, Carla Conway.  We reviewed her clinical course over the past 48 hours with increase in WBC my suspicion this is likely CLL rather than infectious process (feeling well, no pain, afebrile).  Discussed transition back to ALF with hospice despite rising WBC if Dr. Tyrell Antonio feels she is appropriate for discharge.   Length of Stay: 12  Current Medications: Scheduled Meds:  . albuterol  2.5 mg Nebulization Once  . amLODipine  10 mg Oral Daily  . aspirin EC  81 mg Oral Daily  . feeding supplement  1 Container Oral BID BM  . feeding supplement (PRO-STAT SUGAR FREE 64)  30 mL Oral BID  . heparin  5,000 Units Subcutaneous Q8H  . mouth rinse  15 mL Mouth Rinse BID  . metoprolol tartrate  25 mg Oral BID  . montelukast  10 mg Oral QHS  . multivitamin with minerals  1 tablet Oral Daily  . OLANZapine  2.5 mg Oral QHS  . pantoprazole  40 mg Oral Daily  . protein supplement  2 oz Oral QID  . saccharomyces boulardii  250 mg Oral BID  . sertraline  100 mg Oral Daily    Continuous Infusions: . piperacillin-tazobactam (ZOSYN)  IV 3.375 g (06/17/19 0748)    PRN Meds: acetaminophen **OR** acetaminophen, hydrALAZINE, morphine injection, ondansetron (ZOFRAN) IV  Physical Exam         Weak  appearing elderly lady Hard of hearing No distress Abdomen not distended S1 S2 Regular work of breathing Dementia  Vital Signs: BP (!) 158/66 (BP Location: Right Arm)   Pulse 64   Temp (!) 97.3 F (36.3 C) (Oral)   Resp 16   Wt 61.2 kg   SpO2 95%   BMI 21.79 kg/m  SpO2: SpO2: 95 % O2 Device: O2 Device:  Room Air O2 Flow Rate: O2 Flow Rate (L/min): 2 L/min  Intake/output summary:   Intake/Output Summary (Last 24 hours) at 06/17/2019 0929 Last data filed at 06/16/2019 1855 Gross per 24 hour  Intake 600 ml  Output 0 ml  Net 600 ml   LBM: Last BM Date: 06/16/19 Baseline Weight: Weight: 61.2 kg Most recent weight: Weight: 61.2 kg       Palliative Assessment/Data:    Flowsheet Rows     Most Recent Value  Intake Tab  Referral Department  Hospitalist  Unit at Time of Referral  Oncology Unit  Date Notified  06/08/19  Palliative Care Type  New Palliative care  Reason for referral  Clarify Goals of Care  Date of Admission  06/04/19  Date first seen by Palliative Care  06/09/19  # of days Palliative referral response time  1 Day(s)  # of days IP prior to Palliative referral  4  Clinical Assessment  Psychosocial & Spiritual Assessment  Palliative Care Outcomes      Patient Active Problem List   Diagnosis Date Noted  . Protein-calorie malnutrition, severe 06/11/2019  . Acute cholecystitis 06/05/2019  . Dementia without behavioral disturbance ([place]) 06/05/2019  . Diverticulitis 06/05/2019  . Preventive measure 12/29/2017  . Other fatigue 09/25/2017  . Malnutrition of moderate degree 07/20/2017  . Enterocolitis 07/18/2017  . Pancytopenia, acquired ([place]) 07/14/2017  . Malignant cachexia ([place]) 06/04/2017  . Hypokalemia 04/29/2017  . Left arm pain 04/29/2017  . TIA (transient ischemic attack) 04/29/2017  . Abdominal pain 04/28/2017  . Elevated liver enzymes 04/01/2017  . AKI (acute kidney injury) ([place]) 03/31/2017  . Dehydration 03/31/2017  . Generalized weakness  03/31/2017  . External hemorrhoids without complication 50/93/2671  . Port-A-Cath in place 01/06/2017  . Gastritis 01/06/2017  . Poor memory 12/11/2016  . Dysuria 11/13/2016  . Anaphylaxis 10/29/2016  . Anaphylactoid reaction 10/29/2016  . Goals of care, counseling/discussion 10/28/2016  . Chronic kidney disease, stage III (moderate) ([place]) 10/17/2016  . Lymphoma, small lymphocytic ([place]) 10/15/2016  . Neutropenia ([place]) 09/23/2016  . CLL (chronic lymphocytic leukemia) ([place]) 05/11/2016  . Acute on chronic diastolic heart failure ([place]) 05/10/2016  . Influenza A 05/09/2016  . Lymphadenopathy of head and neck region 05/09/2016  . Lymphadenopathy, thoracic 05/09/2016  . COPD (chronic obstructive pulmonary disease) ([place]) 05/09/2016  . Debility 05/09/2016  . Syncopal episodes 05/09/2016  . Syncope 05/08/2016  . HTN (hypertension) 05/18/2013  . CAD in native artery 05/18/2013    Palliative Care Assessment & Plan   Patient Profile:    Assessment:  86 yo with abdominal pain, cholelithiasis, possible cholecystitis, sigmoid diverticulitis, pancreatitis. General surgery is following, serial imaging of the abdomen is being done, antibiotic trial and medical management is ongoing Dementia Minimal PO intake Generalized deconditioning and functional decline.   Recommendations/Plan: Patient with increasing WBC over the past couple of days with indeterminate etiology. Continues to climb despite restarting IV abx, but really no other signs of infection.  I suspect this may be more related to CLL  Overall goal has been transition back to [place] with hospice on discharge, and daughter reports she is agreeable to this as long as patient is without pain.    PMT to f/u tomorrow.   Code Status:    Code Status Orders  (From admission, onward)         Start     Ordered   06/05/19 0516  Do not attempt resuscitation (DNR)  Continuous    Question Answer Comment  In the event of cardiac or  respiratory ARREST Do not call a "code blue"   In the event of cardiac or respiratory ARREST Do not perform Intubation, CPR, defibrillation or ACLS   In the event of cardiac or respiratory ARREST Use medication by any route, position, wound care, and other measures to relive pain and suffering. May use oxygen, suction and manual treatment of airway obstruction as needed for comfort.   Comments I personally called the patient's daughter in the presence of ED nurse who is taking care of the patient and patient 's daughter clearly mentioned that patient is DNR      06/05/19 0516        Code Status History    Date Active Date Inactive Code Status Order ID Comments User Context   06/05/2019 0441 06/05/2019 0515 Full Code [id number]  Edmonia Lynch, DO ED   11/21/2017 1643 11/23/2017 1708 Partial Code [id number]  Damita Lack, MD ED   07/18/2017 1313 07/23/2017 1905 Full Code [id number]  Elodia Florence., MD Inpatient   04/29/2017 1812 05/01/2017 1952 Full Code [id number]  Barton Dubois, MD ED   03/31/2017 2208 04/03/2017 2108 Full Code [id number]  Etta Quill, DO ED   10/29/2016 1534 10/30/2016 1634 Full Code [id number]  Elmarie Shiley, MD Inpatient   05/09/2016 0049 05/11/2016 1904 Full Code [id number]  Roney Jaffe, MD Inpatient   Advance Care Planning Activity       Prognosis:   guarded   Discharge Planning:  To Be Determined  Care plan was discussed with daughter, CM  Thank you for allowing the Palliative Medicine Team to assist in the care of this patient.   Time In: 0910 Time Out: 0930 Total Time 20 Prolonged Time Billed  no       Greater than 50%  of this time was spent counseling and coordinating care related to the above assessment and plan.  Micheline Rough, MD  Please contact Palliative Medicine Team phone at [phone] for questions and concerns.

## 2019-06-17 NOTE — Progress Notes (Signed)
Report called to [facility].

## 2019-06-17 NOTE — Discharge Summary (Addendum)
Physician Discharge Summary  Carla Conway Pra XNT:[id number] DOB: 04-05-1927 DOA: 06/04/2019  PCP: Lujean Amel, MD  Admit date: 06/04/2019 Discharge date: 06/17/2019  Admitted From: Home  Disposition:  Home   Recommendations for Outpatient Follow-up:  1. Discharge home with hospice, plan to finished antibiotics regimen.    Discharge Condition: Stable.  CODE STATUS: DNR Diet recommendation: No added salt diet.   Brief/Interim Summary: 86 year old with past medical history significant for hypertension, hyperlipidemia, CAD, dementia, CLL.  Patient presented secondary to abdominal pain and found to have evidence of cholecystitis, pancreatitis and diverticulitis.  She was a started on empiric antibiotics and general surgery was consulted.  Surgery recommended conservative management for cholecystitis.  Plan to treat with antibiotics for 14 days.  Patient was also found to have acute pancreatitis.  She currently denies abdominal pain.  Patient with poor oral intake.  Patient white count today higher than 2 days ago.  She does have CLL.  I will change Augmentin to Keflex to see if that will help with leukocytosis.  Of note her prior urine culture grew E. coli resistant to ampicillin sulbactam.    1-acute cholecystitis: Seen on abdominal ultrasound.  Patient with poor oral intake and abdominal pain.  General surgery was consulted, recommendation was for conservative management. Palliative care recommendation: DNR, continue current mode of care, hospice to follow-up on patient at her assisted living facility or SNF WBC increase to 30 K Yesterday, antibiotics change to keflex, WBC worse today to 40 K. Change Antibiotics to zosyn.  Discharge on Keflex for 2 weeks.  Leukocytosis likely related to CLL. Patient denies pain, appears comfortable.   2-Acute pancreatitis: In the setting of acute cholecystitis.  CT scan showed pancreatitis. Patient denies worsening abdominal pain.  3-Diverticulitis:  Received treatment with IV antibiotics  CLL: Patient was on prednisone 5 mg which has been on hold due to infectious process  E Coli UTI: She was treated with cefepime which was sensitive to E. Coli  Dementia: Decrease oral intake.  Continue with Zyprexa Zoloft.  Acute Hypoxic respiratory failure. Continue with oxygen supplementation.  Chest x ray on 2-27 negative  Discharge Diagnoses:  Principal Problem:   Acute cholecystitis Active Problems:   Elevated liver enzymes   Dementia without behavioral disturbance (HCC)   Diverticulitis   Protein-calorie malnutrition, severe    Discharge Instructions  Discharge Instructions    Diet - low sodium heart healthy   Complete by: As directed    Increase activity slowly   Complete by: As directed      Allergies as of 06/17/2019      Reactions   Rituximab Other (See Comments)   "She coded"/Upset stomach and blood pressure dropped      Medication List    STOP taking these medications   hydrALAZINE 25 MG tablet Commonly known as: APRESOLINE   lovastatin 40 MG tablet Commonly known as: MEVACOR   potassium chloride 10 MEQ tablet Commonly known as: KLOR-CON   predniSONE 5 MG tablet Commonly known as: DELTASONE     TAKE these medications   acetaminophen 325 MG tablet Commonly known as: TYLENOL Take 325 mg by mouth daily as needed for mild pain.   acyclovir 400 MG tablet Commonly known as: ZOVIRAX Take 1 tablet (400 mg total) by mouth daily.   amLODipine 10 MG tablet Commonly known as: NORVASC Take 1 tablet (10 mg total) by mouth daily.   aspirin 81 MG EC tablet Take 1 tablet (81 mg total) by mouth daily.  cephALEXin 500 MG capsule Commonly known as: KEFLEX Take 1 capsule (500 mg total) by mouth 3 (three) times daily for 14 days.   HYDROcodone-acetaminophen 5-325 MG tablet Commonly known as: NORCO/VICODIN Take 1 tablet by mouth every 8 (eight) hours as needed for pain.   metoprolol tartrate 25 MG  tablet Commonly known as: LOPRESSOR Take 1 tablet (25 mg total) by mouth 2 (two) times daily.   montelukast 10 MG tablet Commonly known as: SINGULAIR Take 10 mg by mouth at bedtime.   OLANZapine 2.5 MG tablet Commonly known as: ZYPREXA Take 2.5 mg by mouth at bedtime.   omeprazole 20 MG capsule Commonly known as: PRILOSEC Take 1 capsule (20 mg total) by mouth daily.   saccharomyces boulardii 250 MG capsule Commonly known as: FLORASTOR Take 1 capsule (250 mg total) by mouth 2 (two) times daily.   sertraline 100 MG tablet Commonly known as: ZOLOFT Take 100 mg by mouth daily.       Allergies  Allergen Reactions  . Rituximab Other (See Comments)    "She coded"/Upset stomach and blood pressure dropped    Consultations:  Palliative   Procedures/Studies: CT ABDOMEN PELVIS W CONTRAST  Result Date: 06/09/2019 CLINICAL DATA:  Abdominal abscess/infection suspected. Signs of chronic cholecystitis, cholelithiasis and pancreatitis. Acute diverticulitis on CT 4 days ago. Patient with history of lymphoma. EXAM: CT ABDOMEN AND PELVIS WITH CONTRAST TECHNIQUE: Multidetector CT imaging of the abdomen and pelvis was performed using the standard protocol following bolus administration of intravenous contrast. CONTRAST:  152m OMNIPAQUE IOHEXOL 300 MG/ML  SOLN COMPARISON:  CT 4 days ago 06/05/2019 FINDINGS: Lower chest: Minimal pleural thickening and dependent atelectasis in the lung bases. Coronary artery calcifications. Hepatobiliary: Scattered hypodensities throughout the liver, largest in the left lobe consistent with simple cyst. Many of these lesions are too small to accurately characterize. Slight decreased gallbladder distension with mild gallbladder wall thickening. Gallstones on prior ultrasound are not well visualized. Mild pericholecystic fat stranding. Prominent common bile duct measuring 10 mm, unchanged. Pancreas: Parenchymal atrophy. Upper normal proximal pancreatic duct at 3 mm.  Peripancreatic fat stranding about the head and uncinate process which is new from prior exam. No peripancreatic fluid collection. No evidence of pancreatic necrosis. Spleen: Splenomegaly with spleen measuring 13 cm cranial caudal with hypodense splenic lesions, largest posteriorly measuring 2.6 cm, unchanged in the short interim. Adrenals/Urinary Tract: No adrenal nodule. No hydronephrosis. 2.2 cm right upper pole renal lesion is unchanged in the short interim. Tiny hypodensities in the left kidney are too small to characterize. Urinary bladder is physiologically distended. No bladder wall thickening. Stomach/Bowel: Nondistended stomach. Normal positioning of the ligament of Treitz. No small bowel obstruction or inflammatory change. Normal appendix. Colonic diverticulosis, extensive in the sigmoid colon. Pericolonic fat stranding and small amount of free fluid adjacent to the sigmoid colon, similar to prior exam. No perforation or abscess. Vascular/Lymphatic: Multifocal adenopathy, most prominent in the retroperitoneum and central mesentery. Findings have not significantly changed in the short interim. Prominent pelvic lymph nodes adjacent to the sigmoid mesocolon which may be reactive or neoplastic. Advanced aortic and branch atherosclerosis. Portal vein is patent. Reproductive: Fluid/low-density in the endometrial canal which measures up to 12 mm, abnormal in a postmenopausal patient. Small calcified fibroid at the fundus. No suspicious adnexal mass. Other: No intra-abdominal abscess. No free air. Trace edema in the pericolic gutters and small amount of free fluid in the pelvis adjacent to colonic inflammation. Musculoskeletal: Unchanged T12 compression fracture. Multilevel degenerative change throughout the spine.  No acute osseous abnormalities or change from recent exam. IMPRESSION: 1. New peripancreatic fat stranding about the head and uncinate process consistent with acute pancreatitis. No peripancreatic  fluid collection or evidence of pancreatic necrosis. 2. Persistent diverticulitis of the sigmoid colon, not significantly changed over the past 4 days. No abscess or free air. 3. Decreased gallbladder distension with persistent wall enhancement and pericholecystic fat stranding. Stable biliary dilatation. 4. Fluid/low-density in the endometrial canal measuring up to 12 mm, abnormal in a postmenopausal patient. Consider nonemergent pelvic ultrasound for characterization. 5. Abdominopelvic adenopathy, splenomegaly with splenic lesions, and right upper pole renal lesion, stable in the short interim. Aortic Atherosclerosis (ICD10-I70.0). Electronically Signed   By: Keith Rake M.D.   On: 06/09/2019 15:56   CT ABDOMEN PELVIS W CONTRAST  Result Date: 06/05/2019 CLINICAL DATA:  Abdominal distension, history of chronic lymphocytic leukemia EXAM: CT ABDOMEN AND PELVIS WITH CONTRAST TECHNIQUE: Multidetector CT imaging of the abdomen and pelvis was performed using the standard protocol following bolus administration of intravenous contrast. CONTRAST:  21m OMNIPAQUE IOHEXOL 300 MG/ML  SOLN COMPARISON:  July 18, 2017 FINDINGS: Lower chest: The visualized heart size within normal limits. Coronary artery and aortic valve calcifications are noted. No pericardial fluid/thickening. No hiatal hernia. The visualized portions of the lungs are clear. Hepatobiliary: Multiple low-density lesions are again noted throughout the liver parenchyma the largest within the left liver lobe measuring 2.8 cm and unchanged from the prior exam. Main portal vein is patent. There appears to be hyperenhancement of the gallbladder wall with mild intrahepatic to biliary ductal dilatation. There is mild surrounding gallbladder wall thickening/trace pericholecystic fluid. There is prominence of the common bile duct measuring up to 9 mm which tapers at the level of the ampulla. No shadowing stones are noted. Pancreas: Unremarkable. No pancreatic  ductal dilatation or surrounding inflammatory changes. Spleen: Scattered low-density lesions are seen throughout the posterior spleen which appear to have increased in size from the prior exam. The largest measuring 2.6 cm, series 2, image 30 and was previously 1.1 cm. Adrenals/Urinary Tract: Both adrenal glands appear normal. There is a hyperdense mass within the upper pole of the right kidney which is increased in size now measuring 2.3 cm in greatest dimension and previously 1.4 cm. No hydronephrosis seen. The bladder is grossly unremarkable. Stomach/Bowel: The stomach and small bowel are normal in appearance. The appendix is unremarkable. Extensive sigmoid colonic diverticulosis is noted. Surrounding mesenteric fat stranding changes are seen around a 9 cm segment of sigmoid colon. No pericolonic free air or loculated fluid collection is seen. Vascular/Lymphatic: Extensive retroperitoneal and mesenteric adenopathy is again identified. They appear to have increased in size and number since the prior exam. For reference within the aortocaval region on series 2, image 50 there is a lymph node now measuring 1.4 cm, and previously 1.1 cm in greatest dimension. There also numerous scattered pericolonic and deep pelvic lymph nodes now noted. Reproductive: The uterus and adnexa are unremarkable. Other: No evidence of abdominal wall mass or hernia. Musculoskeletal: There is interval slight worsening in the chronic superior compression deformity of the T12 vertebral body with approximately 50% loss in vertebral body height, when compared to prior radiograph of March 25, 2019. IMPRESSION: 1. Gallbladder wall thickening/hyperenhancement with a small amount of pericholecystic fluid with mild intrahepatic and common bile duct dilatation. This could be due to cholecystitis and if further evaluation is required would recommend right upper quadrant ultrasound. 2. Interval enlargement of splenic lesions and extensive mesenteric  /  retroperitoneal lymph nodes. These findings are suggestive of interval progression of chronic lymphocytic leukemia. 3. Interval enlargement of hyperdense partially exophytic right upper pole renal lesion now measuring 2.3 cm, concerning for metastatic disease. 4. Findings of acute sigmoid colonic diverticulitis. No pericolonic fluid collections or free air. Electronically Signed   By: Prudencio Pair M.D.   On: 06/05/2019 00:47   DG Chest Portable 1 View  Result Date: 06/04/2019 CLINICAL DATA:  Chest pain. EXAM: PORTABLE CHEST 1 VIEW COMPARISON:  February 27, 2019 FINDINGS: The heart size is stable from prior study. Aortic calcifications are noted. There is elevation of the right hemidiaphragm with right basilar atelectasis versus scarring which is similar to prior study. There is no pneumothorax. No focal infiltrate. No significant pleural effusion. Degenerative changes are again noted of the right glenohumeral joint. IMPRESSION: No active disease. Electronically Signed   By: Constance Holster M.D.   On: 06/04/2019 22:07   US Abdomen Limited RUQ  Result Date: 06/05/2019 CLINICAL DATA:  Right upper quadrant pain EXAM: ULTRASOUND ABDOMEN LIMITED RIGHT UPPER QUADRANT COMPARISON:  None. FINDINGS: Gallbladder: There is layering gallbladder sludge and stones seen throughout. The largest measuring 7 mm. There is diffuse gallbladder wall thickening measuring up to 8.8 mm. Small amount of pericholecystic fluid is seen. There is a sonographic Murphy sign present. Common bile duct: Diameter: 10.2 mm Liver: Numerous anechoic cysts are seen throughout the liver the largest measuring 3.2 cm in the left liver lobe. Within normal limits in parenchymal echogenicity. Portal vein is patent on color Doppler imaging with normal direction of blood flow towards the liver. Other: None. IMPRESSION: Findings suggestive of acute cholecystitis as described above. Mild common bile duct dilatation. Multiple anechoic hepatic cyst.  Electronically Signed   By: Prudencio Pair M.D.   On: 06/05/2019 02:56    (Echo, Carotid, EGD, Colonoscopy, ERCP)    Subjective:   Discharge Exam: Vitals:   06/17/19 0525 06/17/19 1035  BP: (!) 158/66 (!) 138/59  Pulse: 64 72  Resp: 16   Temp: (!) 97.3 F (36.3 C)   SpO2: 95%      General: Pt is alert, awake, not in acute distress Cardiovascular: RRR, S1/S2 +, no rubs, no gallops Respiratory: CTA bilaterally, no wheezing, no rhonchi Abdominal: Soft, NT, ND, bowel sounds + Extremities: no edema, no cyanosis    The results of significant diagnostics from this hospitalization (including imaging, microbiology, ancillary and laboratory) are listed below for reference.     Microbiology: Recent Results (from the past 240 hour(s))  SARS CORONAVIRUS 2 (TAT 6-24 HRS) Nasopharyngeal Nasopharyngeal Swab     Status: None   Collection Time: 06/14/19  8:38 PM   Specimen: Nasopharyngeal Swab  Result Value Ref Range Status   SARS Coronavirus 2 NEGATIVE NEGATIVE Final    Comment: (NOTE) SARS-CoV-2 target nucleic acids are NOT DETECTED. The SARS-CoV-2 RNA is generally detectable in upper and lower respiratory specimens during the acute phase of infection. Negative results do not preclude SARS-CoV-2 infection, do not rule out co-infections with other pathogens, and should not be used as the sole basis for treatment or other patient management decisions. Negative results must be combined with clinical observations, patient history, and epidemiological information. The expected result is Negative. Fact Sheet for Patients: SugarRoll.be Fact Sheet for Healthcare Providers: https://www.woods-mathews.com/ This test is not yet approved or cleared by the [place] FDA and  has been authorized for detection and/or diagnosis of SARS-CoV-2 by FDA under an Emergency Use Authorization (EUA). This EUA  will remain  in effect (meaning this test can be  used) for the duration of the COVID-19 declaration under Section 56 4(b)(1) of the Act, 21 U.S.C. section 360bbb-3(b)(1), unless the authorization is terminated or revoked sooner. Performed at [facility], [place] [address] [place], [place] [address]      Labs: BNP (last 3 results) No results for input(s): BNP in the last 8760 hours. Basic Metabolic Panel: Recent Labs  Lab 06/11/19 0522 06/13/19 0545 06/14/19 1404 06/15/19 0946  NA 138 142 141 141  K 3.2* 2.9* 3.7 3.6  CL 106 107 104 101  CO2 23 28 28 30   GLUCOSE 90 91 117* 88  BUN 17 13 16 9   CREATININE 0.76 0.89 0.86 0.63  CALCIUM 8.2* 8.1* 8.1* 7.9*   Liver Function Tests: Recent Labs  Lab 06/11/19 0522  AST 21  ALT 9  ALKPHOS 85  BILITOT 0.6  PROT 4.7*  ALBUMIN 2.6*   No results for input(s): LIPASE, AMYLASE in the last 168 hours. No results for input(s): AMMONIA in the last 168 hours. CBC: Recent Labs  Lab 06/11/19 0522 06/13/19 0545 06/15/19 0946 06/16/19 0517 06/17/19 0714  WBC 30.1* 23.8* 33.7* 47.0* 52.3*  HGB 10.2* 10.0* 10.6* 11.1* 11.5*  HCT 33.2* 32.0* 34.4* 35.5* 36.8  MCV 97.1 96.1 96.4 96.2 95.3  PLT 124* 110* 141* 156 173   Cardiac Enzymes: No results for input(s): CKTOTAL, CKMB, CKMBINDEX, TROPONINI in the last 168 hours. BNP: Invalid input(s): POCBNP CBG: No results for input(s): GLUCAP in the last 168 hours. D-Dimer No results for input(s): DDIMER in the last 72 hours. Hgb A1c No results for input(s): HGBA1C in the last 72 hours. Lipid Profile No results for input(s): CHOL, HDL, LDLCALC, TRIG, CHOLHDL, LDLDIRECT in the last 72 hours. Thyroid function studies No results for input(s): TSH, T4TOTAL, T3FREE, THYROIDAB in the last 72 hours.  Invalid input(s): FREET3 Anemia work up No results for input(s): VITAMINB12, FOLATE, FERRITIN, TIBC, IRON, RETICCTPCT in the last 72 hours. Urinalysis    Component Value Date/Time   COLORURINE YELLOW 06/11/2019 1053   APPEARANCEUR  CLEAR 06/11/2019 1053   LABSPEC 1.020 06/11/2019 1053   LABSPEC 1.015 11/13/2016 0848   PHURINE 6.0 06/11/2019 1053   GLUCOSEU NEGATIVE 06/11/2019 1053   GLUCOSEU Negative 11/13/2016 0848   HGBUR NEGATIVE 06/11/2019 1053   BILIRUBINUR NEGATIVE 06/11/2019 1053   BILIRUBINUR Negative 11/13/2016 0848   KETONESUR 5 (A) 06/11/2019 1053   PROTEINUR 30 (A) 06/11/2019 1053   UROBILINOGEN 0.2 11/13/2016 0848   NITRITE NEGATIVE 06/11/2019 1053   LEUKOCYTESUR NEGATIVE 06/11/2019 1053   LEUKOCYTESUR Large 11/13/2016 0848   Sepsis Labs Invalid input(s): PROCALCITONIN,  WBC,  LACTICIDVEN Microbiology Recent Results (from the past 240 hour(s))  SARS CORONAVIRUS 2 (TAT 6-24 HRS) Nasopharyngeal Nasopharyngeal Swab     Status: None   Collection Time: 06/14/19  8:38 PM   Specimen: Nasopharyngeal Swab  Result Value Ref Range Status   SARS Coronavirus 2 NEGATIVE NEGATIVE Final    Comment: (NOTE) SARS-CoV-2 target nucleic acids are NOT DETECTED. The SARS-CoV-2 RNA is generally detectable in upper and lower respiratory specimens during the acute phase of infection. Negative results do not preclude SARS-CoV-2 infection, do not rule out co-infections with other pathogens, and should not be used as the sole basis for treatment or other patient management decisions. Negative results must be combined with clinical observations, patient history, and epidemiological information. The expected result is Negative. Fact Sheet for Patients: SugarRoll.be Fact Sheet  for Healthcare Providers: https://www.woods-mathews.com/ This test is not yet approved or cleared by the [place] and  has been authorized for detection and/or diagnosis of SARS-CoV-2 by FDA under an Emergency Use Authorization (EUA). This EUA will remain  in effect (meaning this test can be used) for the duration of the COVID-19 declaration under Section 56 4(b)(1) of the Act, 21 U.S.C. section  360bbb-3(b)(1), unless the authorization is terminated or revoked sooner. Performed at [facility], [place] [address], [place], [place] [address]      Time coordinating discharge: 40 minutes  SIGNED:   Elmarie Shiley, MD  [facility]

## 2019-06-17 NOTE — NC FL2 (Addendum)
[place] LEVEL OF CARE SCREENING TOOL     IDENTIFICATION  Patient Name: Carla Conway [facility] Birthdate: Aug 31, 1926 Sex: female Admission Date (Current Location): 06/04/2019  [facility] and [place] Number:  Herbalist and Address:  [facility],  [place], [place]      Provider Number: [id number]  Attending Physician Name and Address:  Elmarie Shiley, MD  Relative Name and Phone Number:  [name] [phone]    Current Level of Care: Hospital Recommended Level of Care: [place] Prior Approval Number:    Date Approved/Denied:   PASRR Number:    Discharge Plan: Other (Comment)(Assisted living)    Current Diagnoses: Patient Active Problem List   Diagnosis Date Noted  . Protein-calorie malnutrition, severe 06/11/2019  . Acute cholecystitis 06/05/2019  . Dementia without behavioral disturbance ([place]) 06/05/2019  . Diverticulitis 06/05/2019  . Preventive measure 12/29/2017  . Other fatigue 09/25/2017  . Malnutrition of moderate degree 07/20/2017  . Enterocolitis 07/18/2017  . Pancytopenia, acquired ([place]) 07/14/2017  . Malignant cachexia ([place]) 06/04/2017  . Hypokalemia 04/29/2017  . Left arm pain 04/29/2017  . TIA (transient ischemic attack) 04/29/2017  . Abdominal pain 04/28/2017  . Elevated liver enzymes 04/01/2017  . AKI (acute kidney injury) ([place]) 03/31/2017  . Dehydration 03/31/2017  . Generalized weakness 03/31/2017  . External hemorrhoids without complication 57/84/6962  . Port-A-Cath in place 01/06/2017  . Gastritis 01/06/2017  . Poor memory 12/11/2016  . Dysuria 11/13/2016  . Anaphylaxis 10/29/2016  . Anaphylactoid reaction 10/29/2016  . Goals of care, counseling/discussion 10/28/2016  . Chronic kidney disease, stage III (moderate) ([place]) 10/17/2016  . Lymphoma, small lymphocytic ([place]) 10/15/2016  . Neutropenia ([place]) 09/23/2016  . CLL (chronic lymphocytic leukemia) ([place]) 05/11/2016  . Acute on  chronic diastolic heart failure ([place]) 05/10/2016  . Influenza A 05/09/2016  . Lymphadenopathy of head and neck region 05/09/2016  . Lymphadenopathy, thoracic 05/09/2016  . COPD (chronic obstructive pulmonary disease) ([place]) 05/09/2016  . Debility 05/09/2016  . Syncopal episodes 05/09/2016  . Syncope 05/08/2016  . HTN (hypertension) 05/18/2013  . CAD in native artery 05/18/2013    Orientation RESPIRATION BLADDER Height & Weight     Self  Normal Incontinent Weight: 61.2 kg Height:     BEHAVIORAL SYMPTOMS/MOOD NEUROLOGICAL BOWEL NUTRITION STATUS      Incontinent No added Salt  AMBULATORY STATUS COMMUNICATION OF NEEDS Skin   Extensive Assist Verbally Skin abrasions                       Personal Care Assistance Level of Assistance  Bathing, Feeding, Dressing Bathing Assistance: Maximum assistance Feeding assistance: Maximum assistance Dressing Assistance: Maximum assistance     Functional Limitations Info  Hearing   Hearing Info: Impaired      SPECIAL CARE FACTORS FREQUENCY                       Contractures      Additional Factors Info  Code Status Code Status Info: DNR             Current Medications (06/17/2019):  This is the current hospital active medication list Current Facility-Administered Medications  Medication Dose Route Frequency Provider Last Rate Last Admin  . acetaminophen (TYLENOL) tablet 650 mg  650 mg Oral Q6H PRN Bunnie Pion Z, DO   650 mg at 06/07/19 1619   Or  . acetaminophen (TYLENOL) suppository 650 mg  650 mg Rectal  Q6H PRN Bunnie Pion Z, DO      . albuterol (PROVENTIL) (2.5 MG/3ML) 0.083% nebulizer solution 2.5 mg  2.5 mg Nebulization Once Mariel Aloe, MD      . amLODipine (NORVASC) tablet 10 mg  10 mg Oral Daily Rai, Ripudeep K, MD   10 mg at 06/17/19 1035  . aspirin EC tablet 81 mg  81 mg Oral Daily Rai, Ripudeep K, MD   81 mg at 06/17/19 1035  . feeding supplement (BOOST / RESOURCE BREEZE) liquid 1 Container  1  Container Oral BID BM Mariel Aloe, MD   1 Container at 06/17/19 1034  . feeding supplement (PRO-STAT SUGAR FREE 64) liquid 30 mL  30 mL Oral BID Mariel Aloe, MD   30 mL at 06/17/19 1034  . heparin injection 5,000 Units  5,000 Units Subcutaneous Q8H Bunnie Pion Z, DO   5,000 Units at 06/17/19 0631  . hydrALAZINE (APRESOLINE) injection 10 mg  10 mg Intravenous Q6H PRN Rai, Ripudeep K, MD   10 mg at 06/07/19 0535  . MEDLINE mouth rinse  15 mL Mouth Rinse BID Mariel Aloe, MD   15 mL at 06/17/19 1036  . metoprolol tartrate (LOPRESSOR) tablet 25 mg  25 mg Oral BID Rai, Ripudeep K, MD   25 mg at 06/17/19 1035  . montelukast (SINGULAIR) tablet 10 mg  10 mg Oral QHS Rai, Ripudeep K, MD   10 mg at 06/16/19 2157  . morphine 2 MG/ML injection 1 mg  1 mg Intravenous Q4H PRN Bunnie Pion Z, DO   1 mg at 06/05/19 2059  . multivitamin with minerals tablet 1 tablet  1 tablet Oral Daily Mariel Aloe, MD   1 tablet at 06/17/19 1035  . OLANZapine (ZYPREXA) tablet 2.5 mg  2.5 mg Oral QHS Rai, Ripudeep K, MD   2.5 mg at 06/16/19 2155  . ondansetron (ZOFRAN) injection 4 mg  4 mg Intravenous Q6H PRN Mariel Aloe, MD   4 mg at 06/12/19 1139  . pantoprazole (PROTONIX) EC tablet 40 mg  40 mg Oral Daily Rai, Ripudeep K, MD   40 mg at 06/17/19 1035  . piperacillin-tazobactam (ZOSYN) IVPB 3.375 g  3.375 g Intravenous Q8H Pham, Anh P, RPH 12.5 mL/hr at 06/17/19 0748 3.375 g at 06/17/19 0748  . protein supplement (UNJURY CHICKEN SOUP) powder 2 oz  2 oz Oral QID Mariel Aloe, MD   2 oz at 06/17/19 1035  . saccharomyces boulardii (FLORASTOR) capsule 250 mg  250 mg Oral BID Earnstine Regal, PA-C   250 mg at 06/17/19 1035  . sertraline (ZOLOFT) tablet 100 mg  100 mg Oral Daily Rai, Ripudeep K, MD   100 mg at 06/17/19 1036     Discharge Medications:     Medication List    STOP taking these medications   hydrALAZINE 25 MG tablet Commonly known as: APRESOLINE   lovastatin 40 MG tablet Commonly  known as: MEVACOR   potassium chloride 10 MEQ tablet Commonly known as: KLOR-CON   predniSONE 5 MG tablet Commonly known as: DELTASONE     TAKE these medications   acetaminophen 325 MG tablet Commonly known as: TYLENOL Take 325 mg by mouth daily as needed for mild pain.   acyclovir 400 MG tablet Commonly known as: ZOVIRAX Take 1 tablet (400 mg total) by mouth daily.   amLODipine 10 MG tablet Commonly known as: NORVASC Take 1 tablet (10 mg total) by mouth daily.   aspirin 81  MG EC tablet Take 1 tablet (81 mg total) by mouth daily.   cephALEXin 500 MG capsule Commonly known as: KEFLEX Take 1 capsule (500 mg total) by mouth 3 (three) times daily for 14 days.   HYDROcodone-acetaminophen 5-325 MG tablet Commonly known as: NORCO/VICODIN Take 1 tablet by mouth every 8 (eight) hours as needed for pain.   metoprolol tartrate 25 MG tablet Commonly known as: LOPRESSOR Take 1 tablet (25 mg total) by mouth 2 (two) times daily.   montelukast 10 MG tablet Commonly known as: SINGULAIR Take 10 mg by mouth at bedtime.   OLANZapine 2.5 MG tablet Commonly known as: ZYPREXA Take 2.5 mg by mouth at bedtime.   omeprazole 20 MG capsule Commonly known as: PRILOSEC Take 1 capsule (20 mg total) by mouth daily.   saccharomyces boulardii 250 MG capsule Commonly known as: FLORASTOR Take 1 capsule (250 mg total) by mouth 2 (two) times daily.   sertraline 100 MG tablet Commonly known as: ZOLOFT Take 100 mg by mouth daily.        Relevant Imaging Results:  Relevant Lab Results:   Additional Information SSN: [id number]  Shubh Chiara, Marjie Skiff, RN

## 2019-07-07 ENCOUNTER — Other Ambulatory Visit: Payer: Self-pay

## 2019-07-07 ENCOUNTER — Encounter (HOSPITAL_COMMUNITY): Payer: Self-pay | Admitting: Emergency Medicine

## 2019-07-07 ENCOUNTER — Emergency Department (HOSPITAL_COMMUNITY)

## 2019-07-07 ENCOUNTER — Observation Stay (HOSPITAL_COMMUNITY)
Admission: EM | Admit: 2019-07-07 | Discharge: 2019-07-08 | Disposition: A | Discharge status: Skilled Nursing Facility | Attending: Internal Medicine | Admitting: Internal Medicine

## 2019-07-07 DIAGNOSIS — J449 Chronic obstructive pulmonary disease, unspecified: Secondary | ICD-10-CM | POA: Insufficient documentation

## 2019-07-07 DIAGNOSIS — Z8673 Personal history of transient ischemic attack (TIA), and cerebral infarction without residual deficits: Secondary | ICD-10-CM | POA: Diagnosis not present

## 2019-07-07 DIAGNOSIS — Z955 Presence of coronary angioplasty implant and graft: Secondary | ICD-10-CM | POA: Insufficient documentation

## 2019-07-07 DIAGNOSIS — I130 Hypertensive heart and chronic kidney disease with heart failure and stage 1 through stage 4 chronic kidney disease, or unspecified chronic kidney disease: Secondary | ICD-10-CM | POA: Insufficient documentation

## 2019-07-07 DIAGNOSIS — Z79899 Other long term (current) drug therapy: Secondary | ICD-10-CM | POA: Insufficient documentation

## 2019-07-07 DIAGNOSIS — I2510 Atherosclerotic heart disease of native coronary artery without angina pectoris: Secondary | ICD-10-CM | POA: Diagnosis not present

## 2019-07-07 DIAGNOSIS — I214 Non-ST elevation (NSTEMI) myocardial infarction: Secondary | ICD-10-CM | POA: Diagnosis present

## 2019-07-07 DIAGNOSIS — Z7982 Long term (current) use of aspirin: Secondary | ICD-10-CM | POA: Diagnosis not present

## 2019-07-07 DIAGNOSIS — I700 Atherosclerosis of aorta: Secondary | ICD-10-CM | POA: Diagnosis not present

## 2019-07-07 DIAGNOSIS — Z993 Dependence on wheelchair: Secondary | ICD-10-CM | POA: Diagnosis not present

## 2019-07-07 DIAGNOSIS — Z20822 Contact with and (suspected) exposure to covid-19: Secondary | ICD-10-CM | POA: Insufficient documentation

## 2019-07-07 DIAGNOSIS — K5732 Diverticulitis of large intestine without perforation or abscess without bleeding: Secondary | ICD-10-CM | POA: Insufficient documentation

## 2019-07-07 DIAGNOSIS — Z7401 Bed confinement status: Secondary | ICD-10-CM | POA: Diagnosis not present

## 2019-07-07 DIAGNOSIS — F0390 Unspecified dementia without behavioral disturbance: Secondary | ICD-10-CM | POA: Insufficient documentation

## 2019-07-07 DIAGNOSIS — R9431 Abnormal electrocardiogram [ECG] [EKG]: Secondary | ICD-10-CM | POA: Diagnosis present

## 2019-07-07 DIAGNOSIS — N179 Acute kidney failure, unspecified: Secondary | ICD-10-CM | POA: Diagnosis not present

## 2019-07-07 DIAGNOSIS — R55 Syncope and collapse: Principal | ICD-10-CM | POA: Diagnosis present

## 2019-07-07 DIAGNOSIS — E785 Hyperlipidemia, unspecified: Secondary | ICD-10-CM | POA: Insufficient documentation

## 2019-07-07 DIAGNOSIS — N183 Chronic kidney disease, stage 3 unspecified: Secondary | ICD-10-CM | POA: Insufficient documentation

## 2019-07-07 DIAGNOSIS — I10 Essential (primary) hypertension: Secondary | ICD-10-CM | POA: Diagnosis present

## 2019-07-07 DIAGNOSIS — Z87891 Personal history of nicotine dependence: Secondary | ICD-10-CM | POA: Insufficient documentation

## 2019-07-07 DIAGNOSIS — Z66 Do not resuscitate: Secondary | ICD-10-CM | POA: Diagnosis present

## 2019-07-07 DIAGNOSIS — E860 Dehydration: Secondary | ICD-10-CM | POA: Insufficient documentation

## 2019-07-07 DIAGNOSIS — I5032 Chronic diastolic (congestive) heart failure: Secondary | ICD-10-CM | POA: Diagnosis not present

## 2019-07-07 LAB — CBC
HCT: 40.7 % (ref 36.0–46.0)
Hemoglobin: 12.8 g/dL (ref 12.0–15.0)
MCH: 30.8 pg (ref 26.0–34.0)
MCHC: 31.4 g/dL (ref 30.0–36.0)
MCV: 98.1 fL (ref 80.0–100.0)
Platelets: 137 10*3/uL — ABNORMAL LOW (ref 150–400)
RBC: 4.15 MIL/uL (ref 3.87–5.11)
RDW: 16.9 % — ABNORMAL HIGH (ref 11.5–15.5)
WBC: 32.8 10*3/uL — ABNORMAL HIGH (ref 4.0–10.5)
nRBC: 0 % (ref 0.0–0.2)

## 2019-07-07 LAB — BASIC METABOLIC PANEL
Anion gap: 12 (ref 5–15)
BUN: 23 mg/dL (ref 8–23)
CO2: 23 mmol/L (ref 22–32)
Calcium: 8.5 mg/dL — ABNORMAL LOW (ref 8.9–10.3)
Chloride: 110 mmol/L (ref 98–111)
Creatinine, Ser: 1.32 mg/dL — ABNORMAL HIGH (ref 0.44–1.00)
GFR calc Af Amer: 41 mL/min — ABNORMAL LOW (ref >60)
GFR calc non Af Amer: 35 mL/min — ABNORMAL LOW (ref >60)
Glucose, Bld: 119 mg/dL — ABNORMAL HIGH (ref 70–99)
Potassium: 4.9 mmol/L (ref 3.5–5.1)
Sodium: 145 mmol/L (ref 135–145)

## 2019-07-07 LAB — URINALYSIS, ROUTINE W REFLEX MICROSCOPIC
Bacteria, UA: NONE SEEN
Bilirubin Urine: NEGATIVE
Glucose, UA: NEGATIVE mg/dL
Hgb urine dipstick: NEGATIVE
Ketones, ur: NEGATIVE mg/dL
Leukocytes,Ua: NEGATIVE
Nitrite: NEGATIVE
Protein, ur: 30 mg/dL — AB
Specific Gravity, Urine: 1.019 (ref 1.005–1.030)
pH: 6 (ref 5.0–8.0)

## 2019-07-07 LAB — MAGNESIUM: Magnesium: 1.9 mg/dL (ref 1.7–2.4)

## 2019-07-07 LAB — TROPONIN I (HIGH SENSITIVITY)
Troponin I (High Sensitivity): 259 ng/L (ref <18)
Troponin I (High Sensitivity): 305 ng/L (ref <18)

## 2019-07-07 LAB — SODIUM, URINE, RANDOM: Sodium, Ur: 88 mmol/L

## 2019-07-07 LAB — CREATININE, URINE, RANDOM: Creatinine, Urine: 214.86 mg/dL

## 2019-07-07 MED ORDER — ACYCLOVIR 400 MG PO TABS
400.0000 mg | ORAL_TABLET | Freq: Every day | ORAL | Status: DC
  Administered 2019-07-08: 400 mg via ORAL
  Filled 2019-07-07 (×2): qty 1

## 2019-07-07 MED ORDER — LACTATED RINGERS IV SOLN: INTRAVENOUS | Status: AC

## 2019-07-07 MED ORDER — ONDANSETRON HCL 4 MG PO TABS: 4.0000 mg | ORAL_TABLET | Freq: Four times a day (QID) | ORAL | Status: DC | PRN

## 2019-07-07 MED ORDER — ASPIRIN 81 MG PO TBEC
81.0000 mg | DELAYED_RELEASE_TABLET | Freq: Every day | ORAL | Status: DC
  Administered 2019-07-08: 81 mg via ORAL
  Filled 2019-07-07 (×2): qty 1

## 2019-07-07 MED ORDER — ALBUTEROL SULFATE (2.5 MG/3ML) 0.083% IN NEBU: 3.0000 mL | INHALATION_SOLUTION | RESPIRATORY_TRACT | Status: DC | PRN

## 2019-07-07 MED ORDER — SODIUM CHLORIDE 0.9% FLUSH
3.0000 mL | Freq: Two times a day (BID) | INTRAVENOUS | Status: DC
  Administered 2019-07-08 (×2): 3 mL via INTRAVENOUS

## 2019-07-07 MED ORDER — SODIUM CHLORIDE 0.9 % IV BOLUS
500.0000 mL | Freq: Once | INTRAVENOUS | Status: AC
  Administered 2019-07-07: 500 mL via INTRAVENOUS

## 2019-07-07 MED ORDER — SACCHAROMYCES BOULARDII 250 MG PO CAPS
250.0000 mg | ORAL_CAPSULE | Freq: Two times a day (BID) | ORAL | Status: DC
  Administered 2019-07-08: 250 mg via ORAL
  Filled 2019-07-07 (×2): qty 1

## 2019-07-07 MED ORDER — OLANZAPINE 5 MG PO TABS
2.5000 mg | ORAL_TABLET | Freq: Every day | ORAL | Status: DC
  Administered 2019-07-08: 02:00:00 2.5 mg via ORAL
  Filled 2019-07-07: qty 1

## 2019-07-07 MED ORDER — ACETAMINOPHEN 325 MG PO TABS: 325.0000 mg | ORAL_TABLET | Freq: Every day | ORAL | Status: DC | PRN

## 2019-07-07 MED ORDER — MONTELUKAST SODIUM 10 MG PO TABS
10.0000 mg | ORAL_TABLET | Freq: Every day | ORAL | Status: DC
  Administered 2019-07-08: 10 mg via ORAL
  Filled 2019-07-07: qty 1

## 2019-07-07 MED ORDER — PANTOPRAZOLE SODIUM 40 MG PO TBEC
40.0000 mg | DELAYED_RELEASE_TABLET | Freq: Every day | ORAL | Status: DC
  Administered 2019-07-08: 40 mg via ORAL
  Filled 2019-07-07: qty 1

## 2019-07-07 MED ORDER — HYDROCODONE-ACETAMINOPHEN 5-325 MG PO TABS: 1.0000 | ORAL_TABLET | Freq: Four times a day (QID) | ORAL | Status: DC | PRN

## 2019-07-07 MED ORDER — ONDANSETRON HCL 4 MG/2ML IJ SOLN: 4.0000 mg | Freq: Four times a day (QID) | INTRAMUSCULAR | Status: DC | PRN

## 2019-07-07 MED ORDER — METOPROLOL TARTRATE 25 MG PO TABS
25.0000 mg | ORAL_TABLET | Freq: Two times a day (BID) | ORAL | Status: DC
  Administered 2019-07-08 (×2): 25 mg via ORAL
  Filled 2019-07-07 (×2): qty 1

## 2019-07-07 NOTE — ED Triage Notes (Signed)
Per EMS-witnessed syncopal episode-abnormal EKG-history of dementia-from [facility]-chronic lower back pain-500 cc bolus of NS given in route for a BP 98/72, cbg 165

## 2019-07-07 NOTE — ED Notes (Signed)
hospitallist at pt bedside

## 2019-07-07 NOTE — ED Provider Notes (Addendum)
I received the patient in signout from Dr. Eulis Foster.  Briefly the patient is a 86-year-old female with a chief complaints of a syncopal event.  Found to have new ischemic EKG and positive troponin.  Patient was recently placed on hospice.  Plan for a second troponin to assess for stability and then rediscussion with the family about the patient's wishes.  Second troponin is trending slightly upward from the first.  Discussed with family who do not feel that she would want a intervention or even likely a pacemaker.  Felt that keeping her overnight to evaluate for dysrhythmias and an echocardiogram and possibly a cardiology evaluation reasonable.  They plan to discuss it further with her family.  Will discuss with the hospitalist for admission.  I discussed the case with the cardiologist on-call Dr. Margaretann Loveless.  As the patient is on hospice, she did not recommend any further cardiac work-up for this patient.  She recommended that the family and the cardiologist discuss goals of care and if they felt that they would prefer an intervention or a formal cardiology consult to have the hospitalist consult the cardiology group in the morning.     Deno Etienne, DO 07/07/19 Einar Crow

## 2019-07-07 NOTE — ED Provider Notes (Signed)
[place] DEPT Provider Note   CSN: [id number] Arrival date & time: 07/07/19  1316     History Chief Complaint  Patient presents with  . Loss of Consciousness    Carla Conway is a 86 y.o. female.  HPI Patient here for evaluation of syncope.  She was at her nursing care facility when this occurred.  She is unable to give any history.  Level 5 caveat-dementia    Past Medical History:  Diagnosis Date  . Asthma   . CAD (coronary artery disease)   . Dementia without behavioral disturbance ([place]) 06/05/2019  . Falls frequently   . Hyperlipidemia   . Hypertension     Patient Active Problem List   Diagnosis Date Noted  . Protein-calorie malnutrition, severe 06/11/2019  . Acute cholecystitis 06/05/2019  . Dementia without behavioral disturbance ([place]) 06/05/2019  . Diverticulitis 06/05/2019  . Preventive measure 12/29/2017  . Other fatigue 09/25/2017  . Malnutrition of moderate degree 07/20/2017  . Enterocolitis 07/18/2017  . Pancytopenia, acquired ([place]) 07/14/2017  . Malignant cachexia ([place]) 06/04/2017  . Hypokalemia 04/29/2017  . Left arm pain 04/29/2017  . TIA (transient ischemic attack) 04/29/2017  . Abdominal pain 04/28/2017  . Elevated liver enzymes 04/01/2017  . AKI (acute kidney injury) ([place]) 03/31/2017  . Dehydration 03/31/2017  . Generalized weakness 03/31/2017  . External hemorrhoids without complication 63/78/5885  . Port-A-Cath in place 01/06/2017  . Gastritis 01/06/2017  . Poor memory 12/11/2016  . Dysuria 11/13/2016  . Anaphylaxis 10/29/2016  . Anaphylactoid reaction 10/29/2016  . Goals of care, counseling/discussion 10/28/2016  . Chronic kidney disease, stage III (moderate) ([place]) 10/17/2016  . Lymphoma, small lymphocytic ([place]) 10/15/2016  . Neutropenia ([place]) 09/23/2016  . CLL (chronic lymphocytic leukemia) ([place]) 05/11/2016  . Acute on chronic diastolic heart failure ([name]) 05/10/2016  . Influenza A 05/09/2016  .  Lymphadenopathy of head and neck region 05/09/2016  . Lymphadenopathy, thoracic 05/09/2016  . COPD (chronic obstructive pulmonary disease) ([place]) 05/09/2016  . Debility 05/09/2016  . Syncopal episodes 05/09/2016  . Syncope 05/08/2016  . HTN (hypertension) 05/18/2013  . CAD in native artery 05/18/2013    Past Surgical History:  Procedure Laterality Date  . CATARACT EXTRACTION    . CORONARY ANGIOPLASTY  10 -15 years ago   stent in [place] ,[place]  . IR FLUORO GUIDE PORT INSERTION RIGHT  10/23/2016  . IR REMOVAL TUN ACCESS W/ PORT W/O FL MOD SED  02/05/2018  . IR US GUIDE VASC ACCESS RIGHT  10/23/2016     OB History   No obstetric history on file.     Family History  Problem Relation Age of Onset  . Cancer Father        colon    Social History   Tobacco Use  . Smoking status: Former Smoker    Types: Cigarettes    Start date: 05/18/1983  . Smokeless tobacco: Never Used  Substance Use Topics  . Alcohol use: No  . Drug use: No    Home Medications Prior to Admission medications   Medication Sig Start Date End Date Taking? Authorizing Provider  acetaminophen (TYLENOL) 325 MG tablet Take 325 mg by mouth daily as needed for mild pain.     [provider]  acyclovir (ZOVIRAX) 400 MG tablet Take 1 tablet (400 mg total) by mouth daily. 06/04/17   Heath Lark, MD  amLODipine (NORVASC) 10 MG tablet Take 1 tablet (10 mg total) by mouth daily. 11/24/17   Roxan Hockey, MD  aspirin EC 81 MG EC tablet Take 1 tablet (81 mg total) by mouth daily. 05/02/17   Oswald Hillock, MD  HYDROcodone-acetaminophen (NORCO/VICODIN) 5-325 MG tablet Take 1 tablet by mouth every 8 (eight) hours as needed. 06/17/19   Regalado, Belkys A, MD  metoprolol tartrate (LOPRESSOR) 25 MG tablet Take 1 tablet (25 mg total) by mouth 2 (two) times daily. 11/23/17   Roxan Hockey, MD  montelukast (SINGULAIR) 10 MG tablet Take 10 mg by mouth at bedtime. 05/15/17   [provider]  OLANZapine (ZYPREXA) 2.5  MG tablet Take 2.5 mg by mouth at bedtime.    [provider]  omeprazole (PRILOSEC) 20 MG capsule Take 1 capsule (20 mg total) by mouth daily. 01/06/17   Heath Lark, MD  saccharomyces boulardii (FLORASTOR) 250 MG capsule Take 1 capsule (250 mg total) by mouth 2 (two) times daily. 06/17/19   Regalado, Belkys A, MD  sertraline (ZOLOFT) 100 MG tablet Take 100 mg by mouth daily.     [provider]    Allergies    Rituximab  Review of Systems   Review of Systems  Unable to perform ROS: Dementia    Physical Exam Updated Vital Signs BP 133/64 (BP Location: Right Arm)   Pulse 98   Temp 97.7 F (36.5 C) (Oral)   Resp (!) 22   SpO2 92%   Physical Exam Vitals and nursing note reviewed.  Constitutional:      General: She is not in acute distress.    Appearance: She is well-developed. She is not ill-appearing, toxic-appearing or diaphoretic.     Comments: Frail, elderly  HENT:     Head: Normocephalic and atraumatic.     Comments: She is very hard of hearing.    Right Ear: External ear normal.     Left Ear: External ear normal.     Mouth/Throat:     Mouth: Mucous membranes are dry.     Pharynx: No oropharyngeal exudate or posterior oropharyngeal erythema.  Eyes:     Conjunctiva/sclera: Conjunctivae normal.     Pupils: Pupils are equal, round, and reactive to light.  Neck:     Trachea: Phonation normal.  Cardiovascular:     Rate and Rhythm: Normal rate and regular rhythm.     Heart sounds: Normal heart sounds. No murmur.  Pulmonary:     Effort: Pulmonary effort is normal. No respiratory distress.     Breath sounds: Normal breath sounds. No stridor.  Chest:     Chest wall: No tenderness.  Abdominal:     Palpations: Abdomen is soft.     Tenderness: There is no abdominal tenderness.  Musculoskeletal:        General: No swelling, tenderness, deformity or signs of injury. Normal range of motion.     Cervical back: Normal range of motion and neck supple.  Skin:     General: Skin is warm and dry.     Coloration: Skin is not pale.  Neurological:     Mental Status: She is alert.     Cranial Nerves: No cranial nerve deficit.     Sensory: No sensory deficit.     Motor: No abnormal muscle tone.     Coordination: Coordination normal.  Psychiatric:        Mood and Affect: Mood normal.        Behavior: Behavior normal.     ED Results / Procedures / Treatments   Labs (all labs ordered are listed, but only abnormal results  are displayed) Labs Reviewed  BASIC METABOLIC PANEL - Abnormal; Notable for the following components:      Result Value   Glucose, Bld 119 (*)    Creatinine, Ser 1.32 (*)    Calcium 8.5 (*)    GFR calc non Af Amer 35 (*)    GFR calc Af Amer 41 (*)    All other components within normal limits  CBC - Abnormal; Notable for the following components:   WBC 32.8 (*)    RDW 16.9 (*)    Platelets 137 (*)    All other components within normal limits  TROPONIN I (HIGH SENSITIVITY) - Abnormal; Notable for the following components:   Troponin I (High Sensitivity) 259 (*)    All other components within normal limits  URINALYSIS, ROUTINE W REFLEX MICROSCOPIC  TROPONIN I (HIGH SENSITIVITY)    EKG EKG Interpretation  Date/Time:  Thursday July 07 2019 13:32:10 EDT Ventricular Rate:  109 PR Interval:    QRS Duration: 86 QT Interval:  420 QTC Calculation: 566 R Axis:   -18 Text Interpretation: Sinus or ectopic atrial tachycardia Borderline left axis deviation Abnormal R-wave progression, early transition Abnormal T, probable ischemia, widespread Prolonged QT interval 12 Lead; [place] Since last tracing rate faster , and abnormal T wave c/w ischemia Otherwise no significant change Confirmed by Daleen Bo [phone]) on 07/07/2019 1:41:51 PM   Radiology DG Chest Port 1 View  Result Date: 07/07/2019 CLINICAL DATA:  Syncope EXAM: PORTABLE CHEST 1 VIEW COMPARISON:  06/04/2019 FINDINGS: The heart size and mediastinal contours are within  normal limits. Densely calcified thoracic aorta. Linear scarring or atelectasis in the right mid lung and left lung base. Lungs are otherwise clear. No pleural effusion or pneumothorax. The visualized skeletal structures are unremarkable. IMPRESSION: No active disease. Electronically Signed   By: Davina Poke D.O.   On: 07/07/2019 14:13    Procedures Procedures (including critical care time)  Medications Ordered in ED Medications  sodium chloride 0.9 % bolus 500 mL (500 mLs Intravenous New Bag/Given 07/07/19 1414)    ED Course  I have reviewed the triage vital signs and the nursing notes.  Pertinent labs & imaging results that were available during my care of the patient were reviewed by me and considered in my medical decision making (see chart for details).  Clinical Course as of Jul 06 1653  Thu Jul 07, 2019  1410 Patient is followed by [facility] local hospice facility.  She is DNR, and has a living will.  Specifics of living will and not known.  Patient is maintained either in a wheelchair or bedbound.  She requires total care.  She occasionally spits medications but other times is able to take her usual medications.  Patient has severe dementia.   [EW]  1452 No CHF or infiltrate, interpreted by me  DG Chest [facility] [EW]  1559 Normal except glucose high, creatinine high, calcium low, GFR low  Basic metabolic panel(!) [EW]  9741 Normal except white count high, platelets low  CBC(!) [EW]  1608 I discussed the situation with the patient's daughter, [name].  She confirms that the patient is DNR.  She states that at the assisted living facility today, the patient was sitting in a wheelchair when she was found "slumped over."  Apparently it "took a while to wake her up."  When she got awake the patient requested to come to the hospital, and was therefore sent for evaluation.  Patient has a remote history of coronary  disease with stenting about 20 years ago.  She does not have  ongoing cardiac issues that the daughter knows about.   [EW]  1613 Abnormal, high  Troponin I (High Sensitivity)(!!) [EW]    Clinical Course User Index [EW] Daleen Bo, MD   MDM Rules/Calculators/A&P                       Patient Vitals for the past 24 hrs:  BP Temp Temp src Pulse Resp SpO2  07/07/19 1453 133/64 -- -- 98 (!) 22 92 %  07/07/19 1333 111/75 97.7 F (36.5 C) Oral (!) 106 (!) 29 92 %    4:09 PM Reevaluation with update and discussion. After initial assessment and treatment, an updated evaluation reveals she continues to be comfortable has no further complaints. Daleen Bo   Medical Decision Making: Apparent brief syncope, with spontaneous recovery after a few minutes.  Screening EKG indicates generalized ischemia, as compared with prior tracing from 06/04/2019.  Initial troponin elevated, delta troponin ordered.  Patient remains symptomatically unchanged, alert and comfortable.  Vital signs normal.   Verneice Caspers Pra was evaluated in Emergency Department on 07/07/2019 for the symptoms described in the history of present illness. She was evaluated in the context of the global COVID-19 pandemic, which necessitated consideration that the patient might be at risk for infection with the SARS-CoV-2 virus that causes COVID-19. Institutional protocols and algorithms that pertain to the evaluation of patients at risk for COVID-19 are in a state of rapid change based on information released by regulatory bodies including the CDC and federal and state organizations. These policies and algorithms were followed during the patient's care in the ED.   CRITICAL CARE- No Performed by: Daleen Bo   Nursing Notes Reviewed/ Care Coordinated Applicable Imaging Reviewed Interpretation of Laboratory Data incorporated into ED treatment      Final Clinical Impression(s) / ED Diagnoses Final diagnoses:  Syncope, unspecified syncope type  NSTEMI (non-ST elevated myocardial infarction)  ([place])  Abnormal EKG  DNR (do not resuscitate)    Rx / DC Orders ED Discharge Orders    None       Daleen Bo, MD 07/07/19 1655

## 2019-07-07 NOTE — H&P (Addendum)
History and Physical    PLEASE NOTE THAT DRAGON DICTATION SOFTWARE WAS USED IN THE CONSTRUCTION OF THIS NOTE.   Carla Conway DOB: Oct 07, 1926 DOA: 07/07/2019  PCP: Patient, No Pcp Per Patient coming from: [facility]  I have personally briefly reviewed patient's old medical records in [place]  Chief Complaint: Syncope  HPI: Carla Conway is a 86 y.o. female with medical history significant for advanced dementia, hypertension, CAD s/p PCI with stent placement 15 years ago, mild intermittent asthma, chronic diastolic heart failure, CLL, on hospice, who is admitted to [facility] on 07/07/2019 for further evaluation and management of presenting syncope.   In the context of patient's advanced dementia, the following history is provided by the emergency department physicians discussions with the patient's family, discussions with the emergency department physician, and via chart review.  The patient was recently hospitalized within the [facility] health system for acute pancreatitis, and was subsequently discharged to [facility] on hospice.  Earlier today, the patient reportedly experienced a witnessed syncopal episode, in which she did not hit her head with patient regained consciousness within a few seconds of syncopal event.  Unclear if there was any prodromal factor leading up to syncopal event.  No report of tongue biting, loss of bowel or bladder function, or postictal period following aforementioned syncopal event.  Subsequently, the patient was brought via EMS to was in the emergency department for further evaluation of syncope.  MS reported that their initial blood pressure was 90/72, prompting administration of a 500 cc normal saline bolus to the emergency department.    ED Course:  Vital signs in the ED were notable for the following: Temperature max 97.7; heart rate ranged from 95-1 06; blood pressure initially noted to be 91/75, which increased to 136/82  following interval administration of IV fluid bolus, as further described below; respiratory rate 18-22; oxygen saturation 90 to 94% on room air.  Labs were notable for the following: CMP notable for sodium 145, potassium 4.9, bicarbonate 23, BUN 23, creatinine 1.32 relative to most recent prior creatinine data point of 0.63 on 06/15/2019, glucose 119.  CBC notable for the following: White blood cell count of 32,800 relative to most recent prior with blood cell count of 52,300 on 06/17/2019, hemoglobin 12.8, platelets 137.  Initial troponin I noted to be 259, with interval increase to 305 per repeat troponin I.  Urinalysis is notable for no evidence of white blood cells, and was negative for nitrates and leukocyte esterase, and showed no evidence of bacteria; urinalysis showed 30 proteinuria, but no evidence of red blood cell casts.  Nasopharyngeal COVID-19 PCR was performed in the emergency department as ago with result currently pending.  Chest x-ray showed no evidence of acute cardiopulmonary process, including no evidence of injury, edema, effusion, or pneumothorax.  EKG performed in the ED today, in comparison to most recent prior EKG from 06/06/2019, showed sinus tachycardia with ventricular rate 109, prolonged QTC of 566 ms, T wave inversion in leads I, 2, aVF, V2 through V6, with T wave inversion also noted to be present on the history of prior EKG relating to that in V3 and V4.  No evidence of acute ST changes, including no evidence of ST elevation.  The ED physicians, Dr. Eulis Foster and Dr. Tyrone Nine both discussed patient's case, including elevated troponin levels in the context of EKG showing evidence of acute ischemic changes, particularly in the context of patient currently enrolled in hospice.  The patient's  family reportedly conveyed that they would like to continue hospice and conveyed that the patient would not want life prolonging/life preserving care.  Rather, patient's family would like to pursue care  that optimizes the patient's comfort and maximizes the quality of the time that she has left.  As such, the patient's family conveys that, in the context of presenting NSTEMI, that the patient would not want administration of heparin drip, statin, or full dose aspirin, and would not want coronary angiography even if preservation of her life was dependent upon this intervention.  Rather, patient's family consented to overnight observation to include telemetry monitoring to evaluate for the presence of other arrhythmias that may benefit from initiation of an antiarrhythmic, which a few days potentially being consistent with symptomatic management to improve the quality of the patient's life, given that an application for an antiarrhythmic may reduce frequency of subsequent syncopal events.  Additionally, patient's family consented to echocardiogram for the sole purpose of evaluating for interval development of acute systolic heart failure, which, if present, would potentially warrant addition of a diuretic regimen, which they also considered to be symptomatic management for the patient.  I also definitively confirmed patient's DNR/DNI status.  The patient's case was subsequently discussed with the on-call cardiologist, Dr. Margaretann Loveless, with bands of the patient's family's wishes to pursue a plan designed to focus on symptomatic management alone in the absence of any work-up or management for the purpose of life prolonging/life preserving intentions.  With the family's position in mind, Dr. Margaretann Loveless recommended refraining from trending of additional troponin values, as this would not change management per the family's wishes, as above.  Per these discussions, it was determined that no need for formal cardiology consultation exist at this time.   While in the ED, the following were administered: 500 cc IV normal saline bolus.    Review of Systems: As per HPI otherwise 10 point review of systems negative.   Past  Medical History:  Diagnosis Date  . Asthma   . CAD (coronary artery disease)   . Dementia without behavioral disturbance ([place]) 06/05/2019  . Falls frequently   . Hyperlipidemia   . Hypertension     Past Surgical History:  Procedure Laterality Date  . CATARACT EXTRACTION    . CORONARY ANGIOPLASTY  10 -15 years ago   stent in [place] ,[place]  . IR FLUORO GUIDE PORT INSERTION RIGHT  10/23/2016  . IR REMOVAL TUN ACCESS W/ PORT W/O FL MOD SED  02/05/2018  . IR US GUIDE VASC ACCESS RIGHT  10/23/2016    Social History:  reports that she has quit smoking. Her smoking use included cigarettes. She started smoking about 36 years ago. She has never used smokeless tobacco. She reports that she does not drink alcohol or use drugs.   Allergies  Allergen Reactions  . Rituximab Other (See Comments)    "She coded"/Upset stomach and blood pressure dropped    Family History  Problem Relation Age of Onset  . Cancer Father        colon     Prior to Admission medications   Medication Sig Start Date End Date Taking? Authorizing Provider  acetaminophen (TYLENOL) 325 MG tablet Take 325 mg by mouth daily as needed for mild pain.     [provider]  acyclovir (ZOVIRAX) 400 MG tablet Take 1 tablet (400 mg total) by mouth daily. 06/04/17   Heath Lark, MD  amLODipine (NORVASC) 10 MG tablet Take 1 tablet (10 mg total) by  mouth daily. 11/24/17   Roxan Hockey, MD  aspirin EC 81 MG EC tablet Take 1 tablet (81 mg total) by mouth daily. 05/02/17   Oswald Hillock, MD  HYDROcodone-acetaminophen (NORCO/VICODIN) 5-325 MG tablet Take 1 tablet by mouth every 8 (eight) hours as needed. 06/17/19   Regalado, Belkys A, MD  metoprolol tartrate (LOPRESSOR) 25 MG tablet Take 1 tablet (25 mg total) by mouth 2 (two) times daily. 11/23/17   Roxan Hockey, MD  montelukast (SINGULAIR) 10 MG tablet Take 10 mg by mouth at bedtime. 05/15/17   [provider]  OLANZapine (ZYPREXA) 2.5 MG tablet Take 2.5 mg by mouth  at bedtime.    [provider]  omeprazole (PRILOSEC) 20 MG capsule Take 1 capsule (20 mg total) by mouth daily. 01/06/17   Heath Lark, MD  saccharomyces boulardii (FLORASTOR) 250 MG capsule Take 1 capsule (250 mg total) by mouth 2 (two) times daily. 06/17/19   Regalado, Belkys A, MD  sertraline (ZOLOFT) 100 MG tablet Take 100 mg by mouth daily.     [provider]     Objective    Physical Exam: Vitals:   07/07/19 1333 07/07/19 1453 07/07/19 1700 07/07/19 1800  BP: 111/75 133/64 136/82 128/84  Pulse: (!) 106 98 96 95  Resp: (!) 29 (!) 22 18 (!) 22  Temp: 97.7 F (36.5 C)     TempSrc: Oral     SpO2: 92% 92% 94% 94%    General: appears to be stated age; alert; baseline dementia; verbal, able to follow instructions.  Skin: warm, dry, no rash Head:  AT/[place] Eyes:  PEARL b/l, EOMI Mouth:  Oral mucosa membranes appear dry, normal dentition Neck: supple; trachea midline Heart:  RRR; did not appreciate any M/R/G Lungs: CTAB, did not appreciate any wheezes, rales, or rhonchi Abdomen: + BS; soft, ND, NT Vascular: 2+ pedal pulses b/l; 2+ radial pulses b/l Extremities: no peripheral edema, no muscle wasting   Labs on Admission: I have personally reviewed following labs and imaging studies  CBC: Recent Labs  Lab 07/07/19 1359  WBC 32.8*  HGB 12.8  HCT 40.7  MCV 98.1  PLT 191*   Basic Metabolic Panel: Recent Labs  Lab 07/07/19 1359  NA 145  K 4.9  CL 110  CO2 23  GLUCOSE 119*  BUN 23  CREATININE 1.32*  CALCIUM 8.5*   GFR: CrCl cannot be calculated (Unknown ideal weight.). Liver Function Tests: No results for input(s): AST, ALT, ALKPHOS, BILITOT, PROT, ALBUMIN in the last 168 hours. No results for input(s): LIPASE, AMYLASE in the last 168 hours. No results for input(s): AMMONIA in the last 168 hours. Coagulation Profile: No results for input(s): INR, PROTIME in the last 168 hours. Cardiac Enzymes: No results for input(s): CKTOTAL, CKMB, CKMBINDEX,  TROPONINI in the last 168 hours. BNP (last 3 results) No results for input(s): PROBNP in the last 8760 hours. HbA1C: No results for input(s): HGBA1C in the last 72 hours. CBG: No results for input(s): GLUCAP in the last 168 hours. Lipid Profile: No results for input(s): CHOL, HDL, LDLCALC, TRIG, CHOLHDL, LDLDIRECT in the last 72 hours. Thyroid Function Tests: No results for input(s): TSH, T4TOTAL, FREET4, T3FREE, THYROIDAB in the last 72 hours. Anemia Panel: No results for input(s): VITAMINB12, FOLATE, FERRITIN, TIBC, IRON, RETICCTPCT in the last 72 hours. Urine analysis:    Component Value Date/Time   COLORURINE AMBER (A) 07/07/2019 1330   APPEARANCEUR CLEAR 07/07/2019 1330   LABSPEC 1.019 07/07/2019 1330   LABSPEC  1.015 11/13/2016 0848   PHURINE 6.0 07/07/2019 1330   GLUCOSEU NEGATIVE 07/07/2019 1330   GLUCOSEU Negative 11/13/2016 0848   HGBUR NEGATIVE 07/07/2019 1330   BILIRUBINUR NEGATIVE 07/07/2019 1330   BILIRUBINUR Negative 11/13/2016 0848   KETONESUR NEGATIVE 07/07/2019 1330   PROTEINUR 30 (A) 07/07/2019 1330   UROBILINOGEN 0.2 11/13/2016 0848   NITRITE NEGATIVE 07/07/2019 1330   LEUKOCYTESUR NEGATIVE 07/07/2019 1330   LEUKOCYTESUR Large 11/13/2016 0848    Radiological Exams on Admission: DG Chest Port 1 View  Result Date: 07/07/2019 CLINICAL DATA:  Syncope EXAM: PORTABLE CHEST 1 VIEW COMPARISON:  06/04/2019 FINDINGS: The heart size and mediastinal contours are within normal limits. Densely calcified thoracic aorta. Linear scarring or atelectasis in the right mid lung and left lung base. Lungs are otherwise clear. No pleural effusion or pneumothorax. The visualized skeletal structures are unremarkable. IMPRESSION: No active disease. Electronically Signed   By: Davina Poke D.O.   On: 07/07/2019 14:13     EKG: Independently reviewed, with result as described above.    Assessment/Plan   Carla Conway is a 86 y.o. female with medical history significant for  advanced dementia, hypertension, CAD s/p PCI with stent placement 15 years ago, mild intermittent asthma, chronic diastolic heart failure, CLL, on hospice, who is admitted to [facility] on 07/07/2019 for further evaluation and management of presenting syncope.    Principal Problem:   Syncope Active Problems:   HTN (hypertension)   AKI (acute kidney injury) ([place])   NSTEMI (non-ST elevated myocardial infarction) (HCC)   Prolonged QT interval   #) Syncope: The patient presents with one episode of witnessed syncope without associated evidence of, including no evidence of head trauma.  Differential includes acute ventricular arrhythmia given finding of acute NSTEMI per relating elevated troponin as well as evidence of acute ischemic changes on EKG. additionally, the patient appears clinically dry, raising the possibility syncope on the basis of orthostatic hypotension due to dehydration, and further exacerbated by pharmacologic loss of compensatory tachycardia/peripheral vasoconstriction in the context of outpatient medication regimen includes Lopressor as well as Norvasc.  However, unable to assess for orthostatic hypotension given that the patient is chronically wheelchair-bound.  No evidence at this time to suggest underlying acute ischemic CVA or seizure-like activity.  Per discussions with the patient's family, as further documented above, the family requested overnight observation for monitoring of telemetry evidence of ventricular arrhythmia and attempt to optimize antiarrhythmic therapy to reduce subsequent risk of syncope, which they feel would be consistent with improved quality of life be at this symptomatic approach while not violating their desire to not pursue any life-prolonging or life preserving measures, as further detailed above.   Plan: Overnight monitoring on telemetry for identification of ventricular arrhythmias that may benefit from introduction of antiarrhythmic therapy  versus optimization of AV nodal blocking in the form of beta-blocker as a means of attending to focus on symptomatic management as well as quality of life as opposed to care denied to preserve prolong life.  Add on serum magnesium level.  Repeat BMP in the morning.  Gentle IV fluids given clinical appearance of dehydration.  As above, unable to perform orthostatic vital signs in the setting of chronic wheelchair-bound status.    #) NSTEMI: Diagnosis on the basis of elevated presenting I of 259, with repeat values showing interval increase to 305, all in the context of presenting EKG showing evidence of acute ischemic changes in the form of interval development of T wave inversions  in leads I, II, aVF, V2, and V5/V6 in the absence of evidence of ST changes, including no evidence of ST elevation.  Per discussions with the patient's AICD in which they were informed of presenting NSTEMI, family was clear that management of her presenting NSTEMI should be limited to symptomatic management with focus on optimizing patient's quality of life as opposed to initiating any work-up or management that would be associated with life preserving her life prolonging measures.  As such, the patient's family does not initiation of heparin drip, full dose aspirin, or high intensity statin.  Family is also clear that the do not pursue coronary angiography.  Rather, the patient's family requests echocardiogram to evaluate for interval development of evidence of acute systolic heart failure, with identification of such present and the opportunity for initiation of outpatient diuretic, which they feel would be consistent with symptomatic management.   Plan: Per family's wishes, we will refrain from heparin drip, high intensity statin, and full dose aspirin.  We will continue beta-blocker, but we will not initiate an ACE-I or ARB.  No need to further trend troponin, as the subsequent data points would not alter management.  Will  monitor on telemetry for the sole purpose of identification of evidence of ventricular arrhythmias with the possibility of initiation of antiarrhythmics for symptomatic management, as above.  Echocardiogram has been ordered for the morning, as above.     #) Acute kidney injury: Presenting creatinine noted to be 1.32 relative to most recent prior value of 0.63 on 06/15/2019.  Suspect that this is prerenal in nature due to combination of hypoperfusion in the setting of presenting NSTEMI versus contribution from intravascular depletion from dehydration given clinical appearance of sudden.  Presenting urinalysis shows 30 proteinuria, but otherwise shows no evidence of urinary cast.  Plan: Will provide gentle IV fluids as symptomatic management for dehydration, and plan to repeat BMP in the morning.       #) Prolonged QTc interval: Presenting EKG shows prolonged QTc interval of 566 ms. as this would predispose the patient to torsades, increasing risk for ventricular induced syncope, will maintain a level of cognizance regarding medications that could potentially further prolong QTC leading to syncope on this basis.   Plan: Add serum magnesium level to labs already collected in the ED, with plan to provide supplementation and in order to maintain serum magnesium levels greater than equal to 2.0 in order to reduce risk of subsequent ventricular arrhythmia.  We will hold Zoloft for now given associated risk of poor increased QTC.  Monitor on telemetry overnight.      #) Essential hypertension: Outpatient hypertensive medications include Norvasc and Lopressor.  In the setting of presenting NSTEMI, will continue on beta-blocker.  By the same token, will hold him Norvasc for now as there is a contraindication for use of calcium channel blockers in the setting of acute MI.  Initial blood pressure upon arrival at ED noted to be mildly hypotensive, with ensuing improvement following administration of IV fluids,  conveying either some degree of initial dehydration versus diminished cardiac output in the setting of acute MI.   Plan: Continue Lopressor and hold home Norvasc, as further described above.  Gentle IV fluids did tend to clinical evidence for presenting dehydration, as further described above.     #) GERD: On omeprazole as an outpatient.  Plan: Continue on PPI.    DVT prophylaxis: SCDs Code Status: DNR/DNI Family Communication: Patient's case were discussed with the patient's family on multiple  occasions by to ED physicians today, including clarification of goals of care, as further documented above. Disposition Plan: Per Rounding Team Consults called: Case within the context of goals of care, were discussed with the on-call cardiologist, Dr. Margaretann Loveless, as further described above. Admission status: Observation; med telemetry.    PLEASE NOTE THAT DRAGON DICTATION SOFTWARE WAS USED IN THE CONSTRUCTION OF THIS NOTE.   [facility] Pager [phone] From [place].   Otherwise, please contact night-coverage  [email or web address] Password [facility]  07/07/2019, 6:35 PM

## 2019-07-08 ENCOUNTER — Other Ambulatory Visit: Payer: Self-pay

## 2019-07-08 ENCOUNTER — Observation Stay (HOSPITAL_BASED_OUTPATIENT_CLINIC_OR_DEPARTMENT_OTHER)

## 2019-07-08 DIAGNOSIS — I214 Non-ST elevation (NSTEMI) myocardial infarction: Secondary | ICD-10-CM | POA: Diagnosis not present

## 2019-07-08 DIAGNOSIS — R55 Syncope and collapse: Secondary | ICD-10-CM | POA: Diagnosis not present

## 2019-07-08 DIAGNOSIS — R9431 Abnormal electrocardiogram [ECG] [EKG]: Secondary | ICD-10-CM | POA: Diagnosis present

## 2019-07-08 LAB — CBC
HCT: 36.4 % (ref 36.0–46.0)
Hemoglobin: 11.2 g/dL — ABNORMAL LOW (ref 12.0–15.0)
MCH: 30.4 pg (ref 26.0–34.0)
MCHC: 30.8 g/dL (ref 30.0–36.0)
MCV: 98.6 fL (ref 80.0–100.0)
Platelets: 139 10*3/uL — ABNORMAL LOW (ref 150–400)
RBC: 3.69 MIL/uL — ABNORMAL LOW (ref 3.87–5.11)
RDW: 17 % — ABNORMAL HIGH (ref 11.5–15.5)
WBC: 46.7 10*3/uL — ABNORMAL HIGH (ref 4.0–10.5)
nRBC: 0 % (ref 0.0–0.2)

## 2019-07-08 LAB — BASIC METABOLIC PANEL
Anion gap: 8 (ref 5–15)
BUN: 23 mg/dL (ref 8–23)
CO2: 24 mmol/L (ref 22–32)
Calcium: 8.3 mg/dL — ABNORMAL LOW (ref 8.9–10.3)
Chloride: 111 mmol/L (ref 98–111)
Creatinine, Ser: 1.14 mg/dL — ABNORMAL HIGH (ref 0.44–1.00)
GFR calc Af Amer: 49 mL/min — ABNORMAL LOW (ref >60)
GFR calc non Af Amer: 42 mL/min — ABNORMAL LOW (ref >60)
Glucose, Bld: 93 mg/dL (ref 70–99)
Potassium: 5 mmol/L (ref 3.5–5.1)
Sodium: 143 mmol/L (ref 135–145)

## 2019-07-08 LAB — MAGNESIUM: Magnesium: 1.7 mg/dL (ref 1.7–2.4)

## 2019-07-08 LAB — SARS CORONAVIRUS 2 (TAT 6-24 HRS): SARS Coronavirus 2: NEGATIVE

## 2019-07-08 LAB — ECHOCARDIOGRAM COMPLETE

## 2019-07-08 MED ORDER — MAGNESIUM SULFATE 2 GM/50ML IV SOLN
2.0000 g | Freq: Once | INTRAVENOUS | Status: AC
  Administered 2019-07-08: 2 g via INTRAVENOUS
  Filled 2019-07-08: qty 50

## 2019-07-08 MED ORDER — FUROSEMIDE 20 MG PO TABS: ORAL_TABLET | ORAL | 11 refills | Status: AC

## 2019-07-08 NOTE — Discharge Summary (Addendum)
Physician Discharge Summary  Carla Conway UVO:[id number] DOB: Apr 14, 1926 DOA: 07/07/2019  PCP: Patient, No Pcp Per  Admit date: 07/07/2019 Discharge date: 07/08/2019  [place] From:[facility] Disposition:  [facility]  Recommendations for Outpatient Follow-up:  1. Follow up with PCP in 1-2 weeks 2. Hospice to follow patient at the facility 3Please note that I have stopped her antihypertensives as her blood pressure was soft and with a new systolic heart failure hopefully to allow to start Lasix Monday Wednesday and Friday for comfort 4Please adjust the dose of Lasix as needed for comfort care.  Home Health none Equipment/Devices: None Discharge Condition: Stable CODE STATUS: DO NOT RESUSCITATE Diet recommendation: Regular diet Brief/Interim Summary:86 y.o. female with medical history significant for advanced dementia, hypertension, CAD s/p PCI with stent placement 15 years ago, mild intermittent asthma, chronic diastolic heart failure, CLL, on hospice, who is admitted to [facility] on 07/07/2019 for further evaluation and management of presenting syncope.   In the context of patient's advanced dementia, the following history is provided by the emergency department physicians discussions with the patient's family, discussions with the emergency department physician, and via chart review.  The patient was recently hospitalized within the [facility] for acute pancreatitis, and was subsequently discharged to [facility] on hospice.  Earlier today, the patient reportedly experienced a witnessed syncopal episode, in which she did not hit her head with patient regained consciousness within a few seconds of syncopal event.  Unclear if there was any prodromal factor leading up to syncopal event.  No report of tongue biting, loss of bowel or bladder function, or postictal period following aforementioned syncopal event.  Subsequently, the patient was brought via EMS to was in the emergency  department for further evaluation of syncope.  MS reported that their initial blood pressure was 90/72, prompting administration of a 500 cc normal saline bolus to the emergency department.    ED Course:  Vital signs in the ED were notable for the following: Temperature max 97.7; heart rate ranged from 95-1 06; blood pressure initially noted to be 91/75, which increased to 136/82 following interval administration of IV fluid bolus, as further described below; respiratory rate 18-22; oxygen saturation 90 to 94% on room air.  Labs were notable for the following: CMP notable for sodium 145, potassium 4.9, bicarbonate 23, BUN 23, creatinine 1.32 relative to most recent prior creatinine data point of 0.63 on 06/15/2019, glucose 119.  CBC notable for the following: White blood cell count of 32,800 relative to most recent prior with blood cell count of 52,300 on 06/17/2019, hemoglobin 12.8, platelets 137.  Initial troponin I noted to be 259, with interval increase to 305 per repeat troponin I.  Urinalysis is notable for no evidence of white blood cells, and was negative for nitrates and leukocyte esterase, and showed no evidence of bacteria; urinalysis showed 30 proteinuria, but no evidence of red blood cell casts.  Nasopharyngeal COVID-19 PCR was performed in the emergency department as ago with result currently pending.  Chest x-ray showed no evidence of acute cardiopulmonary process, including no evidence of injury, edema, effusion, or pneumothorax.  EKG performed in the ED today, in comparison to most recent prior EKG from 06/06/2019, showed sinus tachycardia with ventricular rate 109, prolonged QTC of 566 ms, T wave inversion in leads I, 2, aVF, V2 through V6, with T wave inversion also noted to be present on the history of prior EKG relating to that in V3 and V4.  No evidence of  acute ST changes, including no evidence of ST elevation.  The ED physicians, Dr. Eulis Foster and Dr. Tyrone Nine both discussed patient's  case, including elevated troponin levels in the context of EKG showing evidence of acute ischemic changes, particularly in the context of patient currently enrolled in hospice.  The patient's family reportedly conveyed that they would like to continue hospice and conveyed that the patient would not want life prolonging/life preserving care.  Rather, patient's family would like to pursue care that optimizes the patient's comfort and maximizes the quality of the time that she has left.  As such, the patient's family conveys that, in the context of presenting NSTEMI, that the patient would not want administration of heparin drip, statin, or full dose aspirin, and would not want coronary angiography even if preservation of her life was dependent upon this intervention.  Rather, patient's family consented to overnight observation to include telemetry monitoring to evaluate for the presence of other arrhythmias that may benefit from initiation of an antiarrhythmic, which a few days potentially being consistent with symptomatic management to improve the quality of the patient's life, given that an application for an antiarrhythmic may reduce frequency of subsequent syncopal events.  Additionally, patient's family consented to echocardiogram for the sole purpose of evaluating for interval development of acute systolic heart failure, which, if present, would potentially warrant addition of a diuretic regimen, which they also considered to be symptomatic management for the patient.  I also definitively confirmed patient's DNR/DNI status.  The patient's case was subsequently discussed with the on-call cardiologist, Dr. Margaretann Loveless, with bands of the patient's family's wishes to pursue a plan designed to focus on symptomatic management alone in the absence of any work-up or management for the purpose of life prolonging/life preserving intentions.  With the family's position in mind, Dr. Margaretann Loveless recommended refraining from  trending of additional troponin values, as this would not change management per the family's wishes, as above.  Per these discussions, it was determined that no need for formal cardiology consultation exist at this time.   While in the ED, the following were administered: 500 cc IV normal saline bolus.  Discharge Diagnoses:  Principal Problem:   Syncope Active Problems:   HTN (hypertension)   AKI (acute kidney injury) (HCC)   NSTEMI (non-ST elevated myocardial infarction) (HCC)   Prolonged QT interval  #1 new onset systolic heart failure-patient admitted with syncope, found to have non-ST elevation MI with elevated troponin and EKG changes with T wave inversions in inferior and lateral leads in the absence of ST elevation.  An echocardiogram done 07/08/2019 shows ejection fraction 25 to 30% with severely decreased function and regional wall motion abnormalities with moderate LVH and grade 1 diastolic dysfunction.  Compared to her echo in 2019 she had normal ejection fraction. Plan will be to treat her for comfort. I am prescribing Lasix 20 mg Monday Wednesday and Friday but adjust the dose as needed to keep the patient comfortable. Closely monitor for dehydration. I have stopped her anti hypertensives that she was on prior to admission to the hospital in order for me to start Lasix since her blood pressure was soft.  Discussed with her daughter. Patient will be discharged back to [facility] today. Continue hospice care.  #2 hypomagnesemia repleted   Estimated body mass index is 21.79 kg/m as calculated from the following:   Height as of 03/25/19: 5' 6"  (1.676 m).   Weight as of 06/05/19: 61.2 kg.  Discharge Instructions  Discharge Instructions  Diet - low sodium heart healthy   Complete by: As directed    Increase activity slowly   Complete by: As directed      Allergies as of 07/08/2019      Reactions   Rituximab Other (See Comments)   "She coded"/Upset stomach and blood  pressure dropped      Medication List    STOP taking these medications   losartan 50 MG tablet Commonly known as: COZAAR     TAKE these medications   acetaminophen 325 MG tablet Commonly known as: TYLENOL Take 325 mg by mouth daily as needed for mild pain.   acyclovir 400 MG tablet Commonly known as: ZOVIRAX Take 1 tablet (400 mg total) by mouth daily.   aspirin 81 MG EC tablet Take 1 tablet (81 mg total) by mouth daily.   furosemide 20 MG tablet Commonly known as: Lasix Please give her lasix 20 mg mon/wed/fri   HYDROcodone-acetaminophen 5-325 MG tablet Commonly known as: NORCO/VICODIN Take 1 tablet by mouth every 8 (eight) hours as needed. What changed: reasons to take this   mirtazapine 15 MG tablet Commonly known as: REMERON Take 15 mg by mouth at bedtime.   montelukast 10 MG tablet Commonly known as: SINGULAIR Take 10 mg by mouth at bedtime.   OLANZapine 2.5 MG tablet Commonly known as: ZYPREXA Take 2.5 mg by mouth at bedtime.   omeprazole 20 MG capsule Commonly known as: PRILOSEC Take 1 capsule (20 mg total) by mouth daily.   saccharomyces boulardii 250 MG capsule Commonly known as: FLORASTOR Take 1 capsule (250 mg total) by mouth 2 (two) times daily.   sertraline 100 MG tablet Commonly known as: ZOLOFT Take 100 mg by mouth daily.       Allergies  Allergen Reactions  . Rituximab Other (See Comments)    "She coded"/Upset stomach and blood pressure dropped    Consultations:  None   Procedures/Studies: CT ABDOMEN PELVIS W CONTRAST  Result Date: 06/09/2019 CLINICAL DATA:  Abdominal abscess/infection suspected. Signs of chronic cholecystitis, cholelithiasis and pancreatitis. Acute diverticulitis on CT 4 days ago. Patient with history of lymphoma. EXAM: CT ABDOMEN AND PELVIS WITH CONTRAST TECHNIQUE: Multidetector CT imaging of the abdomen and pelvis was performed using the standard protocol following bolus administration of intravenous contrast.  CONTRAST:  160m OMNIPAQUE IOHEXOL 300 MG/ML  SOLN COMPARISON:  CT 4 days ago 06/05/2019 FINDINGS: Lower chest: Minimal pleural thickening and dependent atelectasis in the lung bases. Coronary artery calcifications. Hepatobiliary: Scattered hypodensities throughout the liver, largest in the left lobe consistent with simple cyst. Many of these lesions are too small to accurately characterize. Slight decreased gallbladder distension with mild gallbladder wall thickening. Gallstones on prior ultrasound are not well visualized. Mild pericholecystic fat stranding. Prominent common bile duct measuring 10 mm, unchanged. Pancreas: Parenchymal atrophy. Upper normal proximal pancreatic duct at 3 mm. Peripancreatic fat stranding about the head and uncinate process which is new from prior exam. No peripancreatic fluid collection. No evidence of pancreatic necrosis. Spleen: Splenomegaly with spleen measuring 13 cm cranial caudal with hypodense splenic lesions, largest posteriorly measuring 2.6 cm, unchanged in the short interim. Adrenals/Urinary Tract: No adrenal nodule. No hydronephrosis. 2.2 cm right upper pole renal lesion is unchanged in the short interim. Tiny hypodensities in the left kidney are too small to characterize. Urinary bladder is physiologically distended. No bladder wall thickening. Stomach/Bowel: Nondistended stomach. Normal positioning of the ligament of Treitz. No small bowel obstruction or inflammatory change. Normal appendix. Colonic diverticulosis, extensive in  the sigmoid colon. Pericolonic fat stranding and small amount of free fluid adjacent to the sigmoid colon, similar to prior exam. No perforation or abscess. Vascular/Lymphatic: Multifocal adenopathy, most prominent in the retroperitoneum and central mesentery. Findings have not significantly changed in the short interim. Prominent pelvic lymph nodes adjacent to the sigmoid mesocolon which may be reactive or neoplastic. Advanced aortic and branch  atherosclerosis. Portal vein is patent. Reproductive: Fluid/low-density in the endometrial canal which measures up to 12 mm, abnormal in a postmenopausal patient. Small calcified fibroid at the fundus. No suspicious adnexal mass. Other: No intra-abdominal abscess. No free air. Trace edema in the pericolic gutters and small amount of free fluid in the pelvis adjacent to colonic inflammation. Musculoskeletal: Unchanged T12 compression fracture. Multilevel degenerative change throughout the spine. No acute osseous abnormalities or change from recent exam. IMPRESSION: 1. New peripancreatic fat stranding about the head and uncinate process consistent with acute pancreatitis. No peripancreatic fluid collection or evidence of pancreatic necrosis. 2. Persistent diverticulitis of the sigmoid colon, not significantly changed over the past 4 days. No abscess or free air. 3. Decreased gallbladder distension with persistent wall enhancement and pericholecystic fat stranding. Stable biliary dilatation. 4. Fluid/low-density in the endometrial canal measuring up to 12 mm, abnormal in a postmenopausal patient. Consider nonemergent pelvic ultrasound for characterization. 5. Abdominopelvic adenopathy, splenomegaly with splenic lesions, and right upper pole renal lesion, stable in the short interim. Aortic Atherosclerosis (ICD10-I70.0). Electronically Signed   By: Keith Rake M.D.   On: 06/09/2019 15:56   DG Chest Port 1 View  Result Date: 07/07/2019 CLINICAL DATA:  Syncope EXAM: PORTABLE CHEST 1 VIEW COMPARISON:  06/04/2019 FINDINGS: The heart size and mediastinal contours are within normal limits. Densely calcified thoracic aorta. Linear scarring or atelectasis in the right mid lung and left lung base. Lungs are otherwise clear. No pleural effusion or pneumothorax. The visualized skeletal structures are unremarkable. IMPRESSION: No active disease. Electronically Signed   By: Davina Poke D.O.   On: 07/07/2019 14:13    ECHOCARDIOGRAM COMPLETE  Result Date: 07/08/2019    ECHOCARDIOGRAM REPORT   Patient Name:   Carla Conway Date of Exam: 07/08/2019 Medical Rec #:  [id number]    Height:       66.0 in Accession #:    [id number]   Weight:       135.0 lb Date of Birth:  03-11-1927    BSA:          1.692 m Patient Age:    72 years     BP:           120/91 mmHg Patient Gender: F            HR:           86 bpm. Exam Location:  Inpatient Procedure: 2D Echo Indications:    NSTEMI I21.4  History:        Patient has prior history of Echocardiogram examinations, most                 recent 05/01/2017. CAD, Signs/Symptoms:Dementia; Risk                 Factors:Hypertension and Dyslipidemia.  Sonographer:    Darlina Sicilian RDCS Referring Phys: [id number] [place]  1. Akinesis of the distal anteroseptal, distal inferior and apical walls; overall severe LV dysfunction; consider stress induced cardiomyopathy; moderate LVH; grade 1 diastolic dysfunction.  2. Left ventricular ejection fraction, by estimation, is 25 to 30%. The  left ventricle has severely decreased function. The left ventricle demonstrates regional wall motion abnormalities (see scoring diagram/findings for description). There is moderate left ventricular hypertrophy. Left ventricular diastolic parameters are consistent with Grade I diastolic dysfunction (impaired relaxation).  3. Right ventricular systolic function is normal. The right ventricular size is normal.  4. The mitral valve is normal in structure. Trivial mitral valve regurgitation. No evidence of mitral stenosis.  5. The aortic valve has an indeterminant number of cusps. Aortic valve regurgitation is trivial. No aortic stenosis is present.  6. The inferior vena cava is normal in size with greater than 50% respiratory variability, suggesting right atrial pressure of 3 mmHg. FINDINGS  Left Ventricle: Left ventricular ejection fraction, by estimation, is 25 to 30%. The left ventricle has severely decreased  function. The left ventricle demonstrates regional wall motion abnormalities. The left ventricular internal cavity size was normal  in size. There is moderate left ventricular hypertrophy. Left ventricular diastolic parameters are consistent with Grade I diastolic dysfunction (impaired relaxation). Right Ventricle: The right ventricular size is normal.Right ventricular systolic function is normal. Tricuspid regurgitation signal is inadequate for assessing PA pressure. The tricuspid regurgitant velocity is 2.27 m/s, and with an assumed right atrial pressure of 3 mmHg, the estimated right ventricular systolic pressure is 02.5 mmHg. Left Atrium: Left atrial size was normal in size. Right Atrium: Right atrial size was normal in size. Pericardium: There is no evidence of pericardial effusion. Mitral Valve: The mitral valve is normal in structure. Normal mobility of the mitral valve leaflets. Mild mitral annular calcification. Trivial mitral valve regurgitation. No evidence of mitral valve stenosis. Tricuspid Valve: The tricuspid valve is normal in structure. Tricuspid valve regurgitation is trivial. No evidence of tricuspid stenosis. Aortic Valve: The aortic valve has an indeterminant number of cusps. Aortic valve regurgitation is trivial. No aortic stenosis is present. Pulmonic Valve: The pulmonic valve was not well visualized. Pulmonic valve regurgitation is not visualized. No evidence of pulmonic stenosis. Aorta: The aortic root is normal in size and structure. Venous: The inferior vena cava is normal in size with greater than 50% respiratory variability, suggesting right atrial pressure of 3 mmHg.  Additional Comments: Akinesis of the distal anteroseptal, distal inferior and apical walls; overall severe LV dysfunction; consider stress induced cardiomyopathy; moderate LVH; grade 1 diastolic dysfunction.  LEFT VENTRICLE PLAX 2D LVIDd:         4.25 cm     Diastology LVIDs:         2.50 cm     LV e' lateral:   4.35 cm/s  LV PW:         1.30 cm     LV E/e' lateral: 13.9 LV IVS:        1.40 cm     LV e' medial:    4.57 cm/s LVOT diam:     1.80 cm     LV E/e' medial:  13.2 LV SV:         33 LV SV Index:   19 LVOT Area:     2.54 cm  LV Volumes (MOD) LV vol d, MOD A2C: 91.6 ml LV vol d, MOD A4C: 98.7 ml LV vol s, MOD A2C: 61.8 ml LV vol s, MOD A4C: 57.5 ml LV SV MOD A2C:     29.8 ml LV SV MOD A4C:     98.7 ml LV SV MOD BP:      35.6 ml RIGHT VENTRICLE RV S prime:     8.27 cm/s  TAPSE (M-mode): 1.7 cm LEFT ATRIUM             Index       RIGHT ATRIUM           Index LA diam:        2.90 cm 1.71 cm/m  RA Area:     10.20 cm LA Vol (A2C):   51.8 ml 30.61 ml/m RA Volume:   17.70 ml  10.46 ml/m LA Vol (A4C):   41.4 ml 24.47 ml/m LA Biplane Vol: 49.8 ml 29.43 ml/m  AORTIC VALVE LVOT Vmax:   71.10 cm/s LVOT Vmean:  49.900 cm/s LVOT VTI:    0.128 m  AORTA Ao Root diam: 3.30 cm Ao Asc diam:  3.60 cm MITRAL VALVE               TRICUSPID VALVE MV Area (PHT): 4.63 cm    TR Peak grad:   20.6 mmHg MV Decel Time: 164 msec    TR Vmax:        227.00 cm/s MV E velocity: 60.40 cm/s MV A velocity: 88.70 cm/s  SHUNTS MV E/A ratio:  0.68        Systemic VTI:  0.13 m                            Systemic Diam: 1.80 cm Kirk Ruths MD Electronically signed by Kirk Ruths MD Signature Date/Time: 07/08/2019/8:56:23 AM    Final     (Echo, Carotid, EGD, Colonoscopy, ERCP)    Subjective:  Patient resting in bed I can hear audible wheezes however she feels her breathing is okay Discharge Exam: Vitals:   07/08/19 0800 07/08/19 0916  BP: (!) 120/91 133/71  Pulse: 86 94  Resp: (!) 25 20  Temp:  97.9 F (36.6 C)  SpO2: 93% 97%   Vitals:   07/08/19 0500 07/08/19 0730 07/08/19 0800 07/08/19 0916  BP: (!) 121/58 126/73 (!) 120/91 133/71  Pulse: 88 92 86 94  Resp: 20 17 (!) 25 20  Temp:    97.9 F (36.6 C)  TempSrc:    Oral  SpO2: (!) 86% 95% 93% 97%    General: Pt is alert, awake, not in acute distress Cardiovascular: RRR, S1/S2 +, no  rubs, no gallops Respiratory: Wheezing bilaterally, no wheezing, no rhonchi Abdominal: Soft, NT, ND, bowel sounds + Extremities: no edema, no cyanosis    The results of significant diagnostics from this hospitalization (including imaging, microbiology, ancillary and laboratory) are listed below for reference.     Microbiology: Recent Results (from the past 240 hour(s))  SARS CORONAVIRUS 2 (TAT 6-24 HRS) Nasopharyngeal Nasopharyngeal Swab     Status: None   Collection Time: 07/07/19  7:22 PM   Specimen: Nasopharyngeal Swab  Result Value Ref Range Status   SARS Coronavirus 2 NEGATIVE NEGATIVE Final    Comment: (NOTE) SARS-CoV-2 target nucleic acids are NOT DETECTED. The SARS-CoV-2 RNA is generally detectable in upper and lower respiratory specimens during the acute phase of infection. Negative results do not preclude SARS-CoV-2 infection, do not rule out co-infections with other pathogens, and should not be used as the sole basis for treatment or other patient management decisions. Negative results must be combined with clinical observations, patient history, and epidemiological information. The expected result is Negative. Fact Sheet for Patients: SugarRoll.be Fact Sheet for Healthcare Providers: https://www.woods-.com/ This test is not yet approved or cleared by the [place] FDA and  has  been authorized for detection and/or diagnosis of SARS-CoV-2 by FDA under an Emergency Use Authorization (EUA). This EUA will remain  in effect (meaning this test can be used) for the duration of the COVID-19 declaration under Section 56 4(b)(1) of the Act, 21 U.S.C. section 360bbb-3(b)(1), unless the authorization is terminated or revoked sooner. Performed at [facility], [place] [address], [place], [place] [address]      Labs: BNP (last 3 results) No results for input(s): BNP in the last 8760 hours. Basic Metabolic Panel: Recent  Labs  Lab 07/07/19 1359 07/07/19 1938 07/08/19 0500  NA 145  --  143  K 4.9  --  5.0  CL 110  --  111  CO2 23  --  24  GLUCOSE 119*  --  93  BUN 23  --  23  CREATININE 1.32*  --  1.14*  CALCIUM 8.5*  --  8.3*  MG  --  1.9 1.7   Liver Function Tests: No results for input(s): AST, ALT, ALKPHOS, BILITOT, PROT, ALBUMIN in the last 168 hours. No results for input(s): LIPASE, AMYLASE in the last 168 hours. No results for input(s): AMMONIA in the last 168 hours. CBC: Recent Labs  Lab 07/07/19 1359 07/08/19 0500  WBC 32.8* 46.7*  HGB 12.8 11.2*  HCT 40.7 36.4  MCV 98.1 98.6  PLT 137* 139*   Cardiac Enzymes: No results for input(s): CKTOTAL, CKMB, CKMBINDEX, TROPONINI in the last 168 hours. BNP: Invalid input(s): POCBNP CBG: No results for input(s): GLUCAP in the last 168 hours. D-Dimer No results for input(s): DDIMER in the last 72 hours. Hgb A1c No results for input(s): HGBA1C in the last 72 hours. Lipid Profile No results for input(s): CHOL, HDL, LDLCALC, TRIG, CHOLHDL, LDLDIRECT in the last 72 hours. Thyroid function studies No results for input(s): TSH, T4TOTAL, T3FREE, THYROIDAB in the last 72 hours.  Invalid input(s): FREET3 Anemia work up No results for input(s): VITAMINB12, FOLATE, FERRITIN, TIBC, IRON, RETICCTPCT in the last 72 hours. Urinalysis    Component Value Date/Time   COLORURINE AMBER (A) 07/07/2019 1330   APPEARANCEUR CLEAR 07/07/2019 1330   LABSPEC 1.019 07/07/2019 1330   LABSPEC 1.015 11/13/2016 0848   PHURINE 6.0 07/07/2019 1330   GLUCOSEU NEGATIVE 07/07/2019 1330   GLUCOSEU Negative 11/13/2016 0848   HGBUR NEGATIVE 07/07/2019 1330   BILIRUBINUR NEGATIVE 07/07/2019 1330   BILIRUBINUR Negative 11/13/2016 0848   KETONESUR NEGATIVE 07/07/2019 1330   PROTEINUR 30 (A) 07/07/2019 1330   UROBILINOGEN 0.2 11/13/2016 0848   NITRITE NEGATIVE 07/07/2019 1330   LEUKOCYTESUR NEGATIVE 07/07/2019 1330   LEUKOCYTESUR Large 11/13/2016 0848   Sepsis  Labs Invalid input(s): PROCALCITONIN,  WBC,  LACTICIDVEN Microbiology Recent Results (from the past 240 hour(s))  SARS CORONAVIRUS 2 (TAT 6-24 HRS) Nasopharyngeal Nasopharyngeal Swab     Status: None   Collection Time: 07/07/19  7:22 PM   Specimen: Nasopharyngeal Swab  Result Value Ref Range Status   SARS Coronavirus 2 NEGATIVE NEGATIVE Final    Comment: (NOTE) SARS-CoV-2 target nucleic acids are NOT DETECTED. The SARS-CoV-2 RNA is generally detectable in upper and lower respiratory specimens during the acute phase of infection. Negative results do not preclude SARS-CoV-2 infection, do not rule out co-infections with other pathogens, and should not be used as the sole basis for treatment or other patient management decisions. Negative results must be combined with clinical observations, patient history, and epidemiological information. The expected result is Negative. Fact Sheet for Patients: SugarRoll.be Fact Sheet for  Healthcare Providers: https://www.woods-[employer]ak[employer]azi.com/ This test is not yet approved or cleared by the [place] and  has been authorized for detection and/or diagnosis of SARS-CoV-2 by FDA under an Emergency Use Authorization (EUA). This EUA will remain  in effect (meaning this test can be used) for the duration of the COVID-19 declaration under Section 56 4(b)(1) of the Act, 21 U.S.C. section 360bbb-3(b)(1), unless the authorization is terminated or revoked sooner. Performed at [facility], [place] [address], [place], [place] [address]      Time coordinating discharge: 39 minutes  SIGNED:   Georgette Shell, MD  [facility] 07/08/2019, 9:38 AM Pager   If 7PM-7AM, please contact night-coverage [email or web address] Password [email or web address]

## 2019-07-08 NOTE — Progress Notes (Signed)
Pt arrived to unit from ED on stretcher. Slid to bed w/ 3 assist. Pt inc small amount soft stool. Inc care given. Purewick placed. Pt very HOH, pleasantly confused, oriented to self only but follows commands well. Pt oriented to callbell and environment. POC discussed. VSS. No c/o at this time.

## 2019-07-08 NOTE — NC FL2 (Signed)
[place] LEVEL OF CARE SCREENING TOOL     IDENTIFICATION  Patient Name: Carla Conway [facility] Birthdate: December 09, 1926 Sex: female Admission Date (Current Location): 07/07/2019  [facility] and [place] Number:  Herbalist and Address:  [facility],  [place], [place]      Provider Number: [id number]  Attending Physician Name and Address:  Georgette Shell, MD  Relative Name and Phone Number:  [name] [phone] Daughter    Current Level of Care: Hospital Recommended Level of Care: [place] Prior Approval Number:    Date Approved/Denied:   PASRR Number:    Discharge Plan: Other (Comment)(ALF)    Current Diagnoses: Patient Active Problem List   Diagnosis Date Noted  . NSTEMI (non-ST elevated myocardial infarction) ([place]) 07/08/2019  . Prolonged QT interval 07/08/2019  . Protein-calorie malnutrition, severe 06/11/2019  . Acute cholecystitis 06/05/2019  . Dementia without behavioral disturbance ([place]) 06/05/2019  . Diverticulitis 06/05/2019  . Preventive measure 12/29/2017  . Other fatigue 09/25/2017  . Malnutrition of moderate degree 07/20/2017  . Enterocolitis 07/18/2017  . Pancytopenia, acquired ([place]) 07/14/2017  . Malignant cachexia ([place]) 06/04/2017  . Hypokalemia 04/29/2017  . Left arm pain 04/29/2017  . TIA (transient ischemic attack) 04/29/2017  . Abdominal pain 04/28/2017  . Elevated liver enzymes 04/01/2017  . AKI (acute kidney injury) ([place]) 03/31/2017  . Dehydration 03/31/2017  . Generalized weakness 03/31/2017  . External hemorrhoids without complication 07/62/2633  . Port-A-Cath in place 01/06/2017  . Gastritis 01/06/2017  . Poor memory 12/11/2016  . Dysuria 11/13/2016  . Anaphylaxis 10/29/2016  . Anaphylactoid reaction 10/29/2016  . Goals of care, counseling/discussion 10/28/2016  . Chronic kidney disease, stage III (moderate) ([place]) 10/17/2016  . Lymphoma, small lymphocytic ([place])  10/15/2016  . Neutropenia ([place]) 09/23/2016  . CLL (chronic lymphocytic leukemia) ([place]) 05/11/2016  . Acute on chronic diastolic heart failure ([place]) 05/10/2016  . Influenza A 05/09/2016  . Lymphadenopathy of head and neck region 05/09/2016  . Lymphadenopathy, thoracic 05/09/2016  . COPD (chronic obstructive pulmonary disease) ([place]) 05/09/2016  . Debility 05/09/2016  . Syncopal episodes 05/09/2016  . Syncope 05/08/2016  . HTN (hypertension) 05/18/2013  . CAD in native artery 05/18/2013    Orientation RESPIRATION BLADDER Height & Weight     Self  Normal Incontinent Weight:   Height:     BEHAVIORAL SYMPTOMS/MOOD NEUROLOGICAL BOWEL NUTRITION STATUS      Incontinent Diet(Regular)  AMBULATORY STATUS COMMUNICATION OF NEEDS Skin   Extensive Assist Verbally Skin abrasions                       Personal Care Assistance Level of Assistance  Bathing, Feeding, Dressing Bathing Assistance: Maximum assistance Feeding assistance: Maximum assistance Dressing Assistance: Maximum assistance     Functional Limitations Info  Sight, Hearing, Speech Sight Info: Adequate Hearing Info: Impaired(HOH) Speech Info: Adequate    SPECIAL CARE FACTORS FREQUENCY  PT (By licensed PT), OT (By licensed OT)     PT Frequency: eval and treat OT Frequency: eval and treat            Contractures Contractures Info: Not present    Additional Factors Info  Code Status, Allergies Code Status Info: DNR Allergies Info: Rituximab           Current Medications (07/08/2019):  This is the current hospital active medication list Current Facility-Administered Medications  Medication Dose Route Frequency Provider Last Rate Last Admin  . acetaminophen (TYLENOL)  tablet 325 mg  325 mg Oral Daily PRN Howerter, Justin B, DO      . acyclovir (ZOVIRAX) tablet 400 mg  400 mg Oral Daily Howerter, Justin B, DO   400 mg at 07/08/19 1026  . albuterol (PROVENTIL) (2.5 MG/3ML) 0.083% nebulizer solution 3 mL  3 mL  Inhalation Q4H PRN Howerter, Justin B, DO      . aspirin EC tablet 81 mg  81 mg Oral Daily Howerter, Justin B, DO   81 mg at 07/08/19 0225  . HYDROcodone-acetaminophen (NORCO/VICODIN) 5-325 MG per tablet 1 tablet  1 tablet Oral Q6H PRN Howerter, Justin B, DO      . magnesium sulfate IVPB 2 g 50 mL  2 g Intravenous Once Georgette Shell, MD 50 mL/hr at 07/08/19 1029 2 g at 07/08/19 1029  . metoprolol tartrate (LOPRESSOR) tablet 25 mg  25 mg Oral BID Howerter, Justin B, DO   25 mg at 07/08/19 1026  . montelukast (SINGULAIR) tablet 10 mg  10 mg Oral QHS Howerter, Justin B, DO   10 mg at 07/08/19 0225  . OLANZapine (ZYPREXA) tablet 2.5 mg  2.5 mg Oral QHS Howerter, Justin B, DO   2.5 mg at 07/08/19 0224  . ondansetron (ZOFRAN) tablet 4 mg  4 mg Oral Q6H PRN Howerter, Justin B, DO       Or  . ondansetron (ZOFRAN) injection 4 mg  4 mg Intravenous Q6H PRN Howerter, Justin B, DO      . pantoprazole (PROTONIX) EC tablet 40 mg  40 mg Oral Daily Howerter, Justin B, DO   40 mg at 07/08/19 1025  . saccharomyces boulardii (FLORASTOR) capsule 250 mg  250 mg Oral BID Howerter, Justin B, DO   250 mg at 07/08/19 1025  . sodium chloride flush (NS) 0.9 % injection 3 mL  3 mL Intravenous Q12H Howerter, Justin B, DO   3 mL at 07/08/19 0349     Discharge Medications: Please see discharge summary for a list of discharge medications.  Relevant Imaging Results:  Relevant Lab Results:   Additional Information [id number]  Purcell Mouton, RN

## 2019-07-08 NOTE — ED Notes (Signed)
ECHO at bedside.

## 2019-07-08 NOTE — TOC Progression Note (Signed)
Transition of Care [facility]) - Progression Note    Patient Details  Name: Carla Conway MRN: [id number] Date of Birth: 1927/03/07  Transition [facility]) CM/SW Contact  Purcell Mouton, RN Phone Number: 07/08/2019, 12:53 PM  Clinical Narrative:     [facility] pt discharging back to [facility] ALF transportation was called. RN is aware.        Expected Discharge Plan and Services           Expected Discharge Date: 07/08/19                                     Social Determinants of Health (SDOH) Interventions    Readmission Risk Interventions Readmission Risk Prevention Plan 06/13/2019  Transportation Screening Complete  Medication Review Press photographer) Complete  Some recent data might be hidden

## 2019-07-08 NOTE — ED Notes (Signed)
Per [facility], call report to 4W, attempted to call report, Elzie Rings, RN unable to take report.  Will call back in 10 mins.

## 2019-07-08 NOTE — Plan of Care (Signed)
  Problem: Education: Goal: Knowledge of General Education information will improve Description: Including pain rating scale, medication(s)/side effects and non-pharmacologic comfort measures Outcome: Adequate for Discharge   Problem: Health Behavior/Discharge Planning: Goal: Ability to manage health-related needs will improve Outcome: Adequate for Discharge   Problem: Clinical Measurements: Goal: Ability to maintain clinical measurements within normal limits will improve Outcome: Adequate for Discharge   Problem: Nutrition: Goal: Adequate nutrition will be maintained Outcome: Adequate for Discharge   Problem: Elimination: Goal: Will not experience complications related to bowel motility Outcome: Adequate for Discharge Goal: Will not experience complications related to urinary retention Outcome: Adequate for Discharge   Problem: Safety: Goal: Ability to remain free from injury will improve Outcome: Adequate for Discharge

## 2019-07-08 NOTE — TOC Progression Note (Addendum)
Transition of Care [facility]) - Progression Note    Patient Details  Name: Carla Conway MRN: [id number] Date of Birth: 1926-04-28  Transition of Care [facility]) CM/SW Contact  Purcell Mouton, RN Phone Number: 07/08/2019, 11:50 AM  Clinical Narrative:     [facility], was called and information was faxed to [phone].        Expected Discharge Plan and Services           Expected Discharge Date: 07/08/19                                     Social Determinants of Health (SDOH) Interventions    Readmission Risk Interventions Readmission Risk Prevention Plan 06/13/2019  Transportation Screening Complete  Medication Review Press photographer) Complete  Some recent data might be hidden

## 2019-07-08 NOTE — Progress Notes (Signed)
Report called to [place] at [facility]. [place] was familiar with pt. All questions answered, no further questions.

## 2019-07-08 NOTE — Progress Notes (Signed)
  Echocardiogram 2D Echocardiogram has been performed.  Darlina Sicilian M 07/08/2019, 8:38 AM

## 2019-08-06 DEATH — deceased

## 2021-05-10 IMAGING — CR DG CHEST 2V
2 series · 2 of 2 positions shown · non-contrast
Comparison: 11/19/2017

CLINICAL DATA: Right-sided chest pain

EXAM:
CHEST - 2 VIEW

[w chest lat]
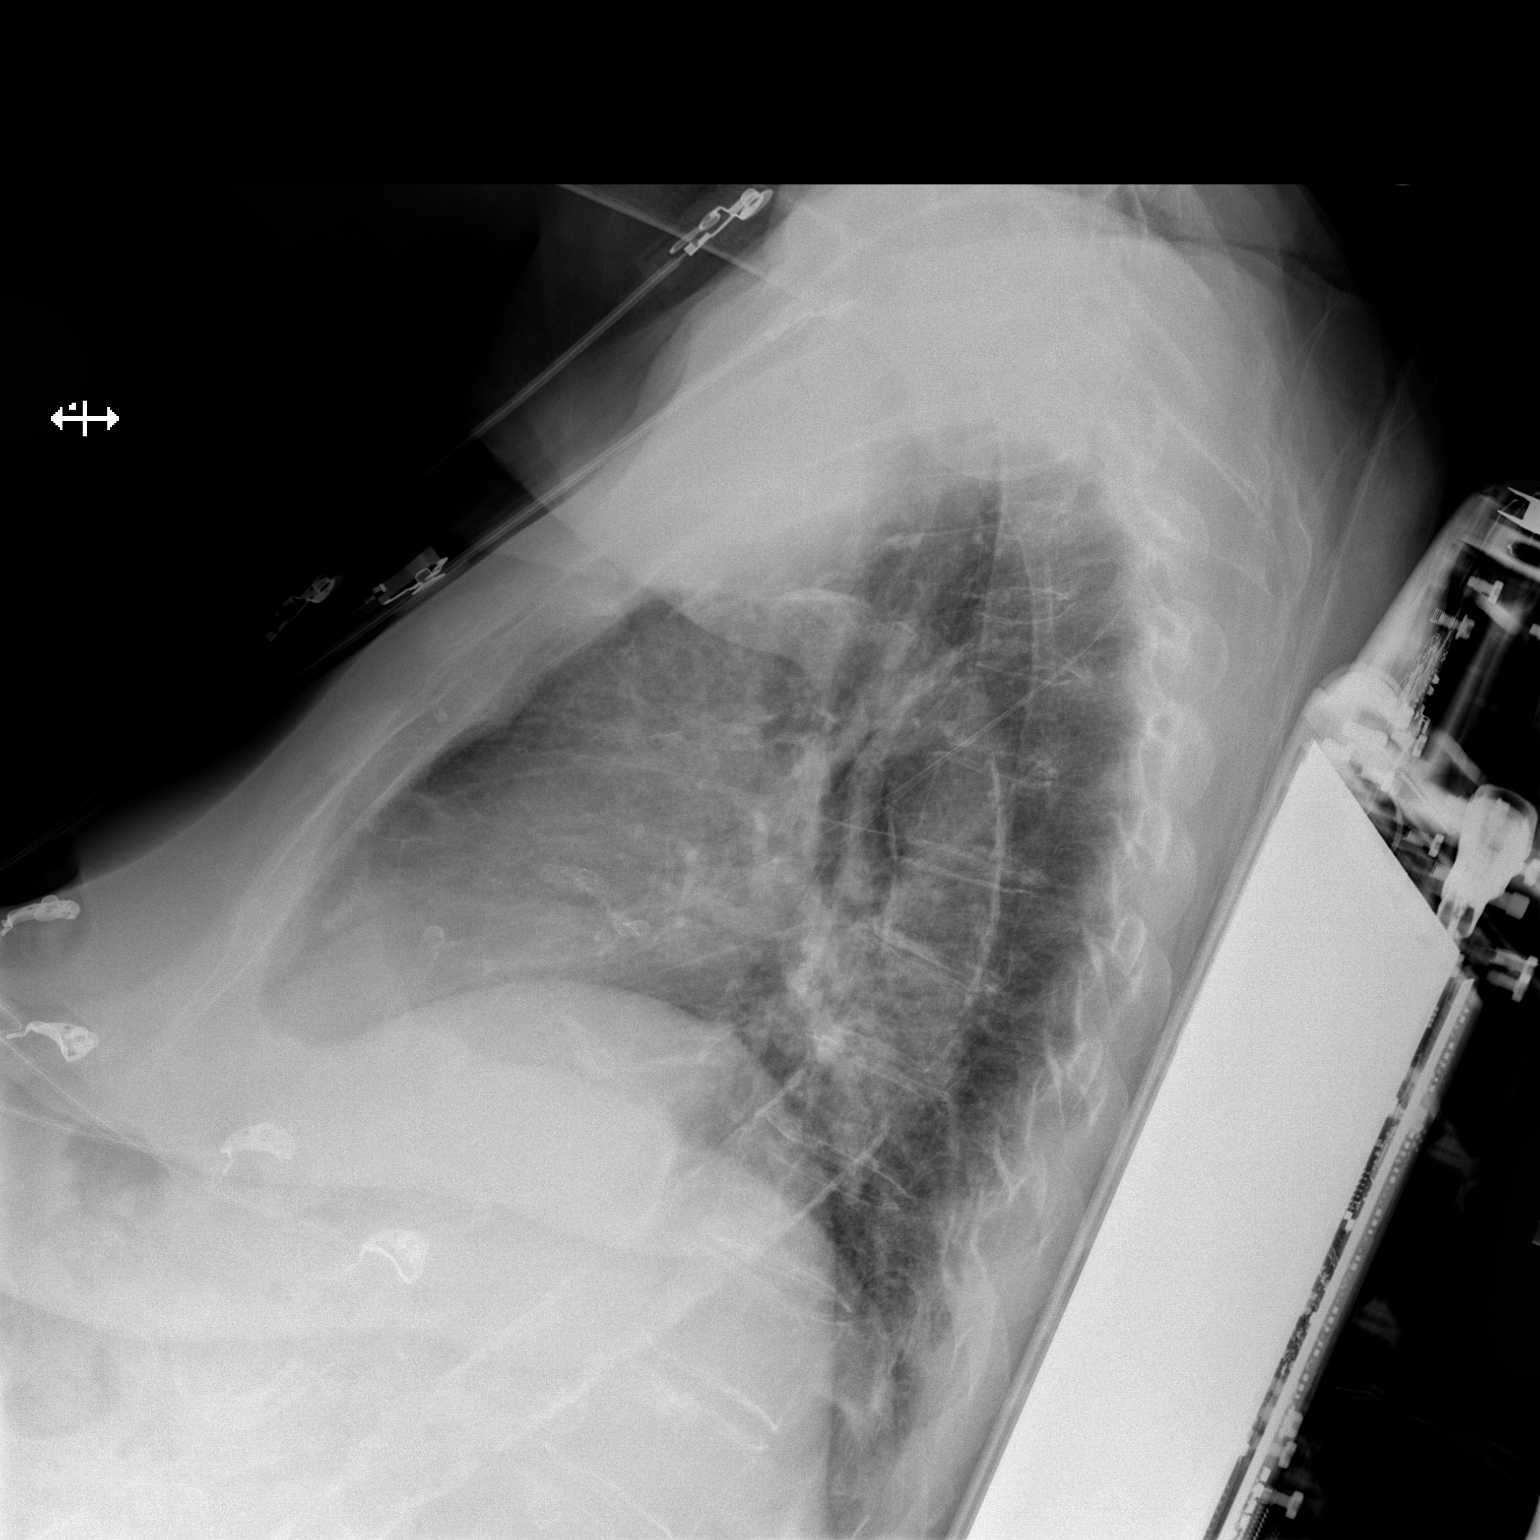

[x chest ap]
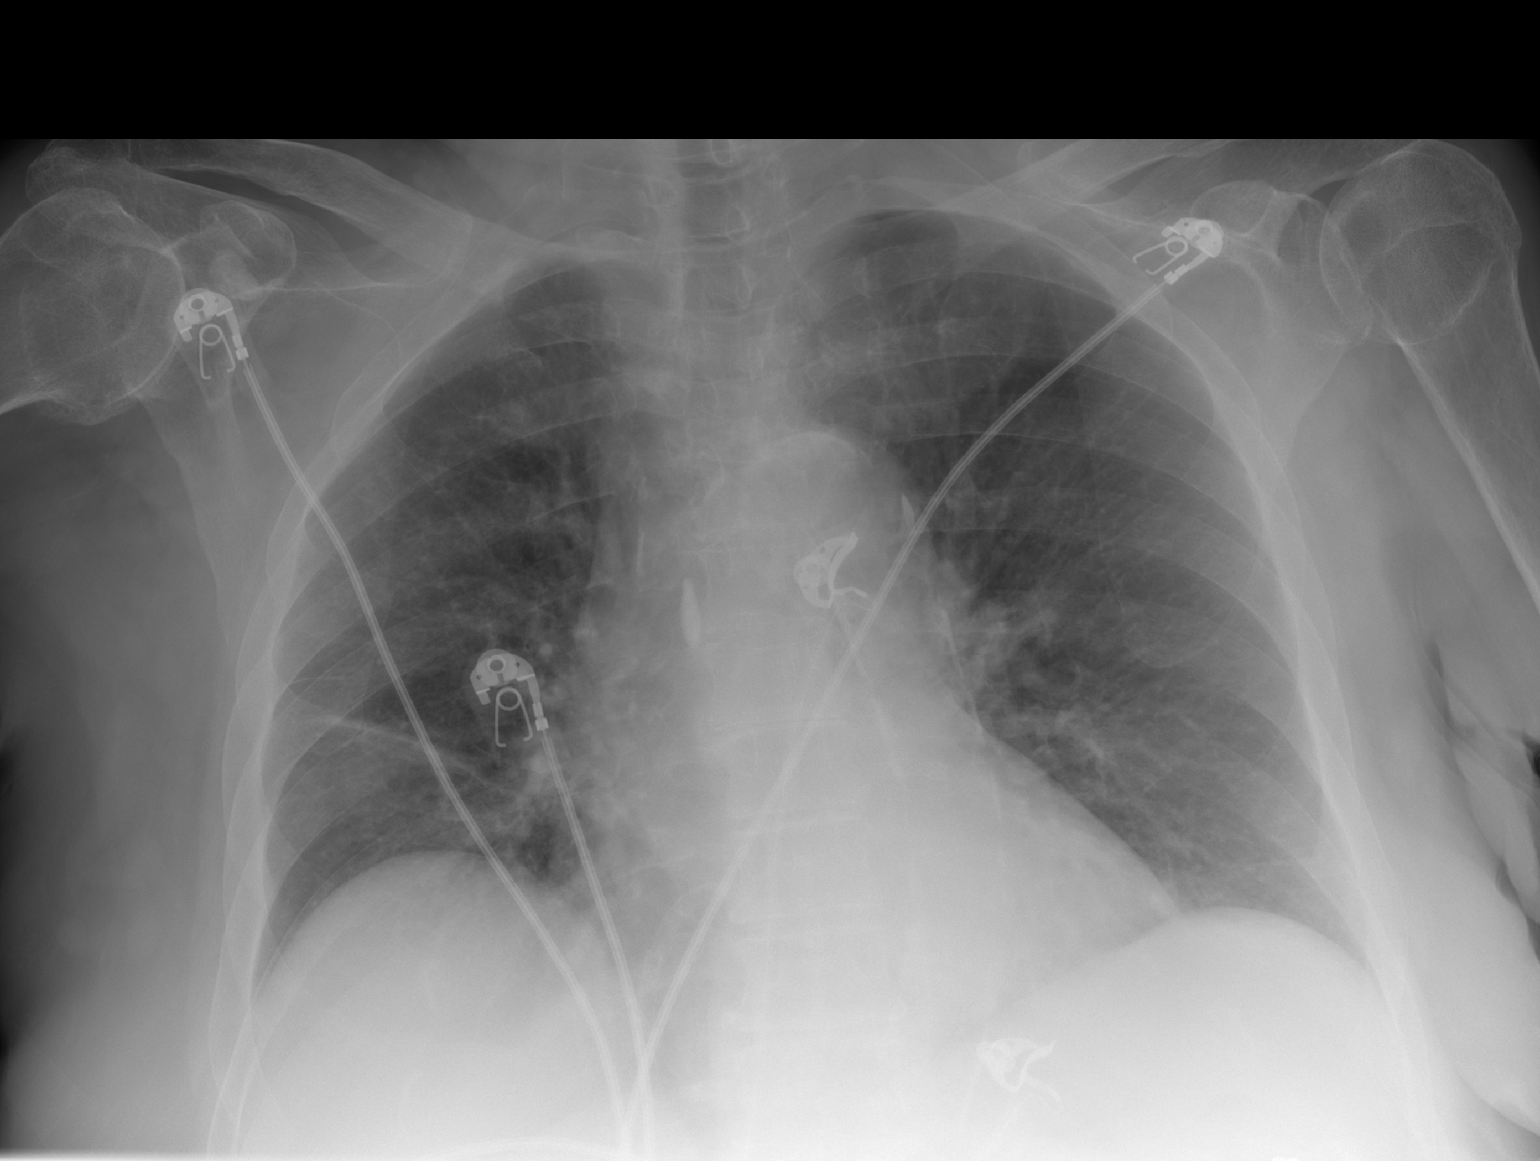

[2 of 2 positions shown; findings below may reference images not displayed]

FINDINGS: The heart size and mediastinal contours are stable. Calcified
thoracic aorta. Chronic linear scarring within the right mid lung.
Lungs are otherwise clear without focal airspace consolidation. No
pleural effusion or pneumothorax. Degenerative changes of the
shoulders, right greater than left.
IMPRESSION: No active cardiopulmonary disease.
